# Patient Record
Sex: Male | Born: 1935 | Race: White | Hispanic: No | Smoking: Former smoker
Health system: Southern US, Community
[De-identification: ages and names within clinical notes are randomized; demographics above are authoritative.]

## PROBLEM LIST (undated history)

## (undated) DIAGNOSIS — M1990 Unspecified osteoarthritis, unspecified site: Secondary | ICD-10-CM

## (undated) DIAGNOSIS — I10 Essential (primary) hypertension: Secondary | ICD-10-CM

## (undated) DIAGNOSIS — E785 Hyperlipidemia, unspecified: Secondary | ICD-10-CM

## (undated) DIAGNOSIS — I495 Sick sinus syndrome: Secondary | ICD-10-CM

## (undated) DIAGNOSIS — I779 Disorder of arteries and arterioles, unspecified: Secondary | ICD-10-CM

## (undated) DIAGNOSIS — I739 Peripheral vascular disease, unspecified: Secondary | ICD-10-CM

## (undated) DIAGNOSIS — I2510 Atherosclerotic heart disease of native coronary artery without angina pectoris: Secondary | ICD-10-CM

## (undated) DIAGNOSIS — C07 Malignant neoplasm of parotid gland: Secondary | ICD-10-CM

## (undated) DIAGNOSIS — I4891 Unspecified atrial fibrillation: Secondary | ICD-10-CM

## (undated) HISTORY — PX: INSERT / REPLACE / REMOVE PACEMAKER: SUR710

## (undated) HISTORY — DX: Atherosclerotic heart disease of native coronary artery without angina pectoris: I25.10

## (undated) HISTORY — DX: Malignant neoplasm of parotid gland: C07

## (undated) HISTORY — DX: Sick sinus syndrome: I49.5

## (undated) HISTORY — PX: EYE SURGERY: SHX253

## (undated) HISTORY — DX: Hyperlipidemia, unspecified: E78.5

## (undated) HISTORY — PX: CATARACT EXTRACTION W/ INTRAOCULAR LENS IMPLANT: SHX1309

## (undated) NOTE — *Deleted (*Deleted)
STROKE TEAM PROGRESS NOTE   INTERVAL HISTORY ***   OBJECTIVE Vitals:   10/13/20 1945 10/14/20 0038 10/14/20 0517 10/14/20 0751  BP: (!) 164/64 (!) 116/94 (!) 169/60 (!) 108/50  Pulse: 64 64 65 64  Resp: 20 20 18 20   Temp: 98.7 F (37.1 C) 97.7 F (36.5 C) 98.1 F (36.7 C) 97.8 F (36.6 C)  TempSrc: Oral Oral Oral   SpO2: 98% 98% 100% 100%  Weight:      Height:        CBC:  Recent Labs  Lab 10/12/20 0855 10/12/20 0855 10/12/20 1817 10/13/20 0246  WBC 9.8  --   --  10.7*  HGB 7.4*   < > 9.2* 9.2*  HCT 24.6*   < > 29.4* 29.2*  MCV 75.7*  --   --  76.6*  PLT 229  --   --  248   < > = values in this interval not displayed.    Basic Metabolic Panel:  Recent Labs  Lab 10/09/20 1112 10/09/20 1112 10/10/20 0340 10/10/20 1835 10/13/20 0246 10/14/20 0327  NA 150*   < > 146*   < > 145 143  K 3.3*   < > 2.9*   < > 3.3* 3.9  CL 118*   < > 119*   < > 114* 112*  CO2 17*   < > 18*   < > 22 20*  GLUCOSE 118*   < > 115*   < > 115* 105*  BUN 41*   < > 44*   < > 30* 26*  CREATININE 1.35*   < > 1.13   < > 1.11 1.03  CALCIUM 8.4*   < > 7.9*   < > 7.9* 7.6*  MG 2.1  --  2.2  --   --   --    < > = values in this interval not displayed.    Lipid Panel:     Component Value Date/Time   CHOL 87 10/07/2020 0539   TRIG 42 10/07/2020 0539   TRIG 39 10/07/2020 0539   HDL 53 10/07/2020 0539   CHOLHDL 1.6 10/07/2020 0539   VLDL 8 10/07/2020 0539   LDLCALC 26 10/07/2020 0539   HgbA1c:  Lab Results  Component Value Date   HGBA1C 5.7 (H) 10/07/2020   Urine Drug Screen:     Component Value Date/Time   LABOPIA NONE DETECTED 07/02/2020 1830   COCAINSCRNUR NONE DETECTED 07/02/2020 1830   LABBENZ NONE DETECTED 07/02/2020 1830   AMPHETMU NONE DETECTED 07/02/2020 1830   THCU NONE DETECTED 07/02/2020 1830   LABBARB NONE DETECTED 07/02/2020 1830    Alcohol Level     Component Value Date/Time   ETH <10 10/06/2020 1727    IMAGING  CT HEAD CODE STROKE WO CONTRAST 10/06/20  1. Hyperdense Right MCA suspicious for Emergent Large Vessel Occlusion.  2. No acute cortically based infarct or acute intracranial hemorrhage identified. ASPECTS 10.  3. Stable CT appearance of brain parenchyma since August.   CT Code Stroke CTA Head and Neck W/WO contrast + CT Perfusion Brain  1. No emergent large vessel occlusion.  2. Persistent radiographic string sign of the proximal right ICA.  3. Delayed transit throughout the entire right cerebral hemisphere and left ACA territory related to the high-grade proximal right ICA stenosis and chronically diminutive/diseased left ACA. No core infarct identified by CTP.  4. Severe right P2 stenosis.  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  6. Bilateral ground-glass lung opacities which  could reflect edema or infection. Moderate right pleural effusion.  7.  Aortic Atherosclerosis (ICD10-I70.0).   CT HEAD WO CONTRAST 10/07/20  No acute finding by CT. Chronic small-vessel ischemic changes throughout the brain as outlined above.   DG Chest Port 1 View 10/06/20 Enlargement of cardiac silhouette with pulmonary vascular congestion. Increased interstitial markings in the mid to lower lungs question minimal edema or developing chronic interstitial lung disease.   Transthoracic Echocardiogram 10/07/20 1. Akinesis of the distal septum and apex; overall mildly reduced LV  function; no thrombus noted using definity.  2. Left ventricular ejection fraction, by estimation, is 40 to 45%. The  left ventricle has mildly decreased function. The left ventricle  demonstrates regional wall motion abnormalities (see scoring  diagram/findings for description). Left ventricular  diastolic function could not be evaluated.  3. Right ventricular systolic function is normal. The right ventricular  size is normal.  4. Left atrial size was severely dilated.  5. Right atrial size was severely dilated.  6. The mitral valve is normal in structure. Mild  mitral valve  regurgitation. No evidence of mitral stenosis.  7. Tricuspid valve regurgitation is moderate.  8. The aortic valve is tricuspid. Aortic valve regurgitation is mild.  Mild aortic valve stenosis.  9. The inferior vena cava is normal in size with greater than 50%  respiratory variability, suggesting right atrial pressure of 3 mmHg.   Transesophageal Echocardiogram 10/10/20 1. Akinesis of the distal septum and apex; overall mild LV dysfunction;  no vegetations.  2. Left ventricular ejection fraction, by estimation, is 45 to 50%. The  left ventricle has mildly decreased function. The left ventricle  demonstrates regional wall motion abnormalities (see scoring  diagram/findings for description).  3. Right ventricular systolic function is normal. The right ventricular  size is normal.  4. Left atrial size was severely dilated. No left atrial/left atrial  appendage thrombus was detected.  5. Right atrial size was moderately dilated.  6. The mitral valve is abnormal. Mild mitral valve regurgitation.  7. Tricuspid valve regurgitation is moderate to severe.  8. The aortic valve is tricuspid. Aortic valve regurgitation is mild.  9. There is Moderate (Grade III) plaque involving the descending aorta.   ECG - atrial fibrillation - ventricular response 78 BPM (See cardiology reading for complete details)  PHYSICAL EXAM ***  Temp:  [97.7 F (36.5 C)-98.9 F (37.2 C)] 97.8 F (36.6 C) (11/20 0751) Pulse Rate:  [62-68] 64 (11/20 0751) Resp:  [18-20] 20 (11/20 0751) BP: (108-169)/(50-94) 108/50 (11/20 0751) SpO2:  [97 %-100 %] 100 % (11/20 0751)  General - Well nourished, well developed, mild SOB with exertional tachypnea  Ophthalmologic - fundi not visualized due to noncooperation.  Cardiovascular - irregularly irregular heart rate and rhythm  Neuro - eyes open, moderate to severe dysarthria but able to answer questions. Orientated to self, age, situation but not  to year or month. However, able to follow simple commands, able to name and repeat but with very dysarthric and SOB with talking. Eyes able to gaze bilaterally, visual field full. PERRL. Left facial droop. Tongue protrusion midline. RUE 4/5, LUE 4-/5 without drift and finger grip and wrist extension 3/5. BLE 3-/5, able to hold knee flexion and foot on bed position but LLE weaker than left. No babinski bilaterally. Sensation subjectively symmetrical, coordination slow but grossly intact bilaterally and gait not tested.   ASSESSMENT/PLAN [name] is a 62 y.o. male with history of atrial fibrillation on Xarelto, PPM, L CEA, CAD,  Htn, HLD, dementia and squamous cell cancer R parotid resected 2014, presenting with left sided weakness. He did not receive IV t-PA due to anticoagulation with Xarelto. Acute right ICA angioplasty with cerebral protection 10/06/20 at 11:28 PM Dr Loreta Ave.  Stroke: R MCA infarct due to right ICA high grade stenosis s/p angioplasty, etiology likely due to large vessel disease. However, given fever and bacteremia, needs to rule out endocarditis   CT Head - Hyperdense Right MCA suspicious for Emergent Large Vessel Occlusion. No acute cortically based infarct or acute intracranial hemorrhage identified. ASPECTS 10.   CTA H&N - Persistent radiographic string sign of the proximal right ICA. Severe right P2 stenosis.   CTP - Delayed transit throughout the entire right cerebral hemisphere and left ACA territory related to the high-grade proximal right ICA stenosis and chronically diminutive/diseased left ACA. No core infarct identified by CTP.   IR - right ICA angioplasty, 86%->41% stenosis post angioplasty  CT head repeat x 2 - No acute finding by CT. Chronic small-vessel ischemic changes throughout the brain as outlined above.   2D Echo - EF 40 to 45% down from 60-65% in 06/2020, akinesis of the distal septum and apex  Carotid doppler right ICA 80-99% stenosis  Loyal Jacobson Virus 2 - negative  LDL - 26  HgbA1c - 5.7  UDS - not ordered  VTE prophylaxis - Lovenox  Xarelto (rivaroxaban) daily prior to admission, now on ASA 81 given Carotid stenosis. Xarelto resumed on 10/11/20 but currently on hold due to GI bleeding and anemia. S/p PRBC, if Hb continues to be stable for the next 4-5 days, will re-challenge with Xarelto  Patient counseled to be compliant with his antithrombotic medications  Ongoing aggressive stroke risk factor management  Therapy recommendations:  CIR  Disposition:  Anticipating admission to CIR tomorrow if stable.  Carotid stenosis  Hx of left ICA stenosis s/p left CEA  06/2020 CT head and neck showed right ICA bulb string sign  This admission CT head and neck again showed right ICA bulb string sign, status post angioplasty.  Right ICA 86% -> 41% stenosis post-procedure  Carotid Doppler post angioplasty right ICA 80-99% stenosis  Discussed with Dr. Loreta Ave, given clinical improvement, no further carotid revascularization needed at this time.  Will close follow-up in the clinic.  Continue ASA   Chronic afib Tachy-brady syndrome s/p pacer Cardiomyopathy  Pulmonary edema  On Xarelto PTA  Xarelto resumed for atrial fibrillation on 10/11/20 -> on hold 11/18 due to GIB  2D Echo - EF 40 to 45% down from 60-65% in 06/2020, akinesis of the distal septum and apex  CXR concerning for pulmonary edema -> improved  Still has tachypnea   TEE severe LAE; spontaneous contrast in LAA but no thrombus  Cardiology on board - appreciate help  Bacteremia  Fever Leukocytosis  Tmax 101.1->afebrile  On Abx for total 10 day course  Blood culture 2/2 group B strep - pan sensitive  WBC 17.1->17.7->15.2->8.9->9.8->10.7  TEE no endocarditis  CCM signed off  GIB  GI bleeding in 03/2018 with melena.  He received 2 units PRBC and IV iron.  GI recommended PPI twice daily, and holding Xarelto for a week at that time.    This  admission, given his GIB history, dropping hemoglobin, CHF, he received 1 unit PRBC transfusion 11/18.   Start on iron pills  Continue PPI twice daily.    Hb 8.8->9.8->7.4->9.2->9.2  Will hold off Xarelto for now, continue baby aspirin.    Once  hemoglobin stabilized for 4-5 days, will rechallenge with Xarelto.    Hx of TIA  06/2020 admitted to [facility] for confusion and slurry speech with slump over and left facial droop. CTA head and neck showed right ICA bulb string sign. EF 60-65%, LDL 61 and A1C 5.9.   Hx of Hypertension Hypotension   Home BP meds: metoprolol  Current BP meds: off levophed  SBP has fluctuated in the last 24 hours alternating between soft pressures and elevations to the 150's and 160s. Will adjust HTN regimen as indicated.  . Long-term BP goal normotensive  Hyperlipidemia  Home Lipid lowering medication: Lipitor 40 mg daily  LDL 26, goal < 70  Now on Lipitor 20 mg this hospitalization.   Continue statin at discharge  Dysphagia   Passed barium study 11/15  On dys1 and thin  Speech on board  Other Stroke Risk Factors  Advanced age  Former cigarette smoker - quit 47 years ago  ETOH use, pt has 1 drink per week  Coronary artery disease  Other Active Problems   Code status - DNR  Aortic Atherosclerosis (ICD10-I70.0).   Acute blood loss anemia - Hgb - 8.8->7.9->8.2->8.8->9.8->7.4->transfused ->9.2->9.2 CKD - stage 3b - creatinine - 1.30->1.23->1.48->1.13->1.05>1.11  Dementia - lives with son  Hypokalemia K 4.3-3.3-2.9->3.3 - supplement and recheck in AM ->3.9  Bmet and CBC were ordered for today. Bmet results in chart. CBC still not back. Called lab. They said specimen was clotted but for some reason not redrawn. Discussed with pt's nurse. She will put in stat order for CBC. Pt for possible CIR admission today.  Hospital day # 8  *** To contact Stroke Continuity provider, please refer to [email or web address]. After hours, contact General Neurology

## (undated) NOTE — *Deleted (*Deleted)
Progress Note  Patient Name: Fernando [name] Date of Encounter: 10/08/2020  [facility] Cardiologist: No primary care provider on file. ***  Subjective   ***  Inpatient Medications    Scheduled Meds: . aspirin  300 mg Rectal Daily   Or  . aspirin  325 mg Oral Daily  . atorvastatin  80 mg Oral Daily  . Chlorhexidine Gluconate Cloth  6 each Topical Daily  . docusate sodium  100 mg Oral Daily  . enoxaparin (LOVENOX) injection  30 mg Subcutaneous Q24H  . insulin aspart  0-9 Units Subcutaneous Q4H  . levothyroxine  50 mcg Oral Q0600  . mouth rinse  15 mL Mouth Rinse BID  . polyethylene glycol  17 g Oral Daily   Continuous Infusions: . sodium chloride 30 mL/hr at 10/08/20 1800  . cefTRIAXone (ROCEPHIN)  IV Stopped (10/07/20 2342)  . norepinephrine (LEVOPHED) Adult infusion 3 mcg/min (10/08/20 1800)   PRN Meds: acetaminophen **OR** acetaminophen   Vital Signs    Vitals:   10/08/20 1815 10/08/20 1830 10/08/20 1845 10/08/20 2006  BP: 140/79 119/65 129/66   Pulse: 84 67 67   Resp: (!) 32 (!) 27 (!) 22   Temp:    98.8 F (37.1 C)  TempSrc:    Axillary  SpO2: 95% 92% 92%   Weight:      Height:        Intake/Output Summary (Last 24 hours) at 10/08/2020 2008 Last data filed at 10/08/2020 1800 Gross per 24 hour  Intake 2291.51 ml  Output 1735 ml  Net 556.51 ml   Last 3 Weights 10/06/2020 07/02/2020 05/08/2020  Weight (lbs) 143 lb 143 lb 14.4 oz 130 lb  Weight (kg) 64.864 kg 65.273 kg 58.968 kg      Telemetry    *** - Personally Reviewed  ECG    *** - Personally Reviewed  Physical Exam  *** GEN: No acute distress.   Neck: No JVD Cardiac: RRR, no murmurs, rubs, or gallops.  Respiratory: Clear to auscultation bilaterally. GI: Soft, nontender, non-distended  MS: No edema; No deformity. Neuro:  Nonfocal  Psych: Normal affect   Labs    High Sensitivity Troponin:  No results for input(s): TROPONINIHS in the last 720 hours.    Chemistry Recent Labs   Lab 10/06/20 1042 10/06/20 1042 10/06/20 1738 10/06/20 1738 10/07/20 0242 10/07/20 0539 10/08/20 0225  NA 138   < > 142   < > 145 140 146*  K 3.8   < > 3.7   < > 3.7 3.2* 4.3  CL 102   < > 109  --   --  114* 118*  CO2 20*  --   --   --   --  16* 16*  GLUCOSE 172*   < > 145*  --   --  110* 108*  BUN 37*   < > 32*  --   --  32* 34*  CREATININE 1.49*   < > 1.30*  --   --  1.23 1.48*  CALCIUM 9.1  --   --   --   --  7.4* 7.9*  PROT 7.8  --   --   --   --   --   --   ALBUMIN 3.7  --   --   --   --   --   --   AST 29  --   --   --   --   --   --   ALT  18  --   --   --   --   --   --   ALKPHOS 62  --   --   --   --   --   --   BILITOT 1.0  --   --   --   --   --   --   GFRNONAA 44*  --   --   --   --  55* 44*  ANIONGAP 16*  --   --   --   --  10 12   < > = values in this interval not displayed.     Hematology Recent Labs  Lab 10/06/20 1042 10/06/20 1738 10/07/20 0242 10/07/20 0539 10/08/20 0225  WBC 17.1*  --   --  17.1* 17.7*  RBC 3.95*  --   --  3.42* 3.62*  HGB 9.1*   < > 8.8* 7.9* 8.2*  HCT 30.1*   < > 26.0* 25.6* 27.5*  MCV 76.2*  --   --  74.9* 76.0*  MCH 23.0*  --   --  23.1* 22.7*  MCHC 30.2  --   --  30.9 29.8*  RDW 18.2*  --   --  17.9* 18.4*  PLT 242  --   --  196 228   < > = values in this interval not displayed.    BNP Recent Labs  Lab 10/06/20 1042  BNP 1,097.0*     DDimer No results for input(s): DDIMER in the last 168 hours.   Radiology    CT HEAD WO CONTRAST  Result Date: 10/07/2020 CLINICAL DATA:  Stenotic disease of the carotid arteries right worse than left. Assess for potential infarction on the right. EXAM: CT HEAD WITHOUT CONTRAST TECHNIQUE: Contiguous axial images were obtained from the base of the skull through the vertex without intravenous contrast. COMPARISON:  10/06/2020 FINDINGS: Brain: Chronic appearing small vessel ischemic changes of the pons and midbrain. Old cerebellar infarctions on the left. Cerebral hemispheres show an old  lacunar infarction in the left thalamus and right caudate head and chronic small-vessel ischemic changes of the cerebral hemispheric white matter. No sign of acute infarction, mass lesion, hemorrhage, hydrocephalus or extra-axial collection. Vascular: There is atherosclerotic calcification of the major vessels at the base of the brain. Skull: Negative Sinuses/Orbits: Mucosal inflammatory changes of the paranasal sinuses. Orbits negative. Other: None IMPRESSION: No acute finding by CT. Chronic small-vessel ischemic changes throughout the brain as outlined above. Electronically Signed   By: Paulina Fusi M.D.   On: 10/07/2020 06:15   DG Chest Port 1 View  Result Date: 10/08/2020 CLINICAL DATA:  Shortness of breath EXAM: PORTABLE CHEST 1 VIEW COMPARISON:  October 06, 2020 FINDINGS: There is cardiomegaly with pulmonary venous hypertension. Pacemaker leads are attached to the right atrium and right ventricle. There are small pleural effusions bilaterally with mild interstitial edema. No consolidation. There is aortic atherosclerosis. No adenopathy. No bone lesions. IMPRESSION: Cardiomegaly with pulmonary vascular congestion. Small pleural effusions bilaterally with mild interstitial edema. Suspect a degree of underlying congestive heart failure. Pacemaker leads attached to right atrium and right ventricle. Aortic Atherosclerosis (ICD10-I70.0). Electronically Signed   By: Bretta Bang III M.D.   On: 10/08/2020 09:57   ECHOCARDIOGRAM COMPLETE  Result Date: 10/07/2020    ECHOCARDIOGRAM REPORT   Patient Name:   Fernando [name] Date of Exam: 10/07/2020 Medical Rec #:  [id number]      Height:       65.0 in Accession #:  [id number]     Weight:       143.0 lb Date of Birth:  [birth year]     BSA:          1.715 m Patient Age:    89 years       BP:           119/82 mmHg Patient Gender: M              HR:           111 bpm. Exam Location:  Inpatient Procedure: 2D Echo, Intracardiac Opacification Agent, Color Doppler  and Cardiac            Doppler Indications:    Stroke  History:        Patient has prior history of Echocardiogram examinations, most                 recent 07/03/2020. CAD, Pacemaker, Arrythmias:Atrial Fibrillation;                 Risk Factors:Dyslipidemia, Hypertension and Former Smoker.                 Tachy-Brady syndrome.  Sonographer:    Ross Ludwig RDCS (AE) Referring Phys: [id number] Marvel Plan IMPRESSIONS  1. Akinesis of the distal septum and apex; overall mildly reduced LV function; no thrombus noted using definity.  2. Left ventricular ejection fraction, by estimation, is 40 to 45%. The left ventricle has mildly decreased function. The left ventricle demonstrates regional wall motion abnormalities (see scoring diagram/findings for description). Left ventricular diastolic function could not be evaluated.  3. Right ventricular systolic function is normal. The right ventricular size is normal.  4. Left atrial size was severely dilated.  5. Right atrial size was severely dilated.  6. The mitral valve is normal in structure. Mild mitral valve regurgitation. No evidence of mitral stenosis.  7. Tricuspid valve regurgitation is moderate.  8. The aortic valve is tricuspid. Aortic valve regurgitation is mild. Mild aortic valve stenosis.  9. The inferior vena cava is normal in size with greater than 50% respiratory variability, suggesting right atrial pressure of 3 mmHg. FINDINGS  Left Ventricle: Left ventricular ejection fraction, by estimation, is 40 to 45%. The left ventricle has mildly decreased function. The left ventricle demonstrates regional wall motion abnormalities. Definity contrast agent was given IV to delineate the left ventricular endocardial borders. The left ventricular internal cavity size was normal in size. There is no left ventricular hypertrophy. Left ventricular diastolic function could not be evaluated due to atrial fibrillation. Left ventricular diastolic function could not be evaluated. Right  Ventricle: The right ventricular size is normal. Right ventricular systolic function is normal. Left Atrium: Left atrial size was severely dilated. Right Atrium: Right atrial size was severely dilated. Pericardium: Trivial pericardial effusion is present. Mitral Valve: The mitral valve is normal in structure. Mild mitral annular calcification. Mild mitral valve regurgitation. No evidence of mitral valve stenosis. Tricuspid Valve: The tricuspid valve is normal in structure. Tricuspid valve regurgitation is moderate . No evidence of tricuspid stenosis. Aortic Valve: The aortic valve is tricuspid. Aortic valve regurgitation is mild. Aortic regurgitation PHT measures 292 msec. Mild aortic stenosis is present. Aortic valve mean gradient measures 10.5 mmHg. Aortic valve peak gradient measures 21.6 mmHg. Aortic valve area, by VTI measures 1.46 cm. Pulmonic Valve: The pulmonic valve was not well visualized. Pulmonic valve regurgitation is not visualized. No evidence of pulmonic stenosis. Aorta: The aortic root is normal in size  and structure. Venous: The inferior vena cava is normal in size with greater than 50% respiratory variability, suggesting right atrial pressure of 3 mmHg. IAS/Shunts: No atrial level shunt detected by color flow Doppler. Additional Comments: Akinesis of the distal septum and apex; overall mildly reduced LV function; no thrombus noted using definity. A pacer wire is visualized.  LEFT VENTRICLE PLAX 2D LVOT diam:     2.00 cm LV SV:         49 LV SV Index:   29 LVOT Area:     3.14 cm  RIGHT VENTRICLE            IVC RV Basal diam:  3.30 cm    IVC diam: 2.10 cm RV S prime:     9.46 cm/s TAPSE (M-mode): 1.3 cm LEFT ATRIUM            Index       RIGHT ATRIUM           Index LA Vol (A2C): 81.4 ml  47.46 ml/m RA Area:     31.90 cm LA Vol (A4C): 102.0 ml 59.47 ml/m RA Volume:   109.00 ml 63.55 ml/m  AORTIC VALVE AV Area (Vmax):    1.42 cm AV Area (Vmean):   1.28 cm AV Area (VTI):     1.46 cm AV Vmax:            232.50 cm/s AV Vmean:          147.500 cm/s AV VTI:            0.335 m AV Peak Grad:      21.6 mmHg AV Mean Grad:      10.5 mmHg LVOT Vmax:         105.00 cm/s LVOT Vmean:        60.100 cm/s LVOT VTI:          0.156 m LVOT/AV VTI ratio: 0.47 AI PHT:            292 msec  AORTA Ao Root diam: 3.50 cm Ao Asc diam:  3.00 cm TRICUSPID VALVE TR Peak grad:   57.8 mmHg TR Vmax:        380.00 cm/s  SHUNTS Systemic VTI:  0.16 m Systemic Diam: 2.00 cm Olga Millers MD Electronically signed by Olga Millers MD Signature Date/Time: 10/07/2020/4:08:38 PM    Final     Cardiac Studies   10/07/20: Echocardiogram 10/07/2020  1. Akinesis of the distal septum and apex; overall mildly reduced LV  function; no thrombus noted using definity.  2. Left ventricular ejection fraction, by estimation, is 40 to 45%. The  left ventricle has mildly decreased function. The left ventricle  demonstrates regional wall motion abnormalities (see scoring  diagram/findings for description). Left ventricular  diastolic function could not be evaluated.  3. Right ventricular systolic function is normal. The right ventricular  size is normal.  4. Left atrial size was severely dilated.  5. Right atrial size was severely dilated.  6. The mitral valve is normal in structure. Mild mitral valve  regurgitation. No evidence of mitral stenosis.  7. Tricuspid valve regurgitation is moderate.  8. The aortic valve is tricuspid. Aortic valve regurgitation is mild.  Mild aortic valve stenosis.  9. The inferior vena cava is normal in size with greater than 50%  respiratory variability, suggesting right atrial pressure of 3 mmHg.   Patient Profile     60 y.o. male history of tachy-brady syndrome s/p PPM placement, permanent Afib  on xarelto, carotid artery stenosis who presented with stroke found to have strep bacteremia and systolic heart failure.  Assessment & Plan    #Strep agalactiae bacteremia #Sepsis  #Systolic  heart failure:  #Right MCA stroke  #Permanent Afib CHADs-vasc 7  #Tachy-brady syndrome s/p PPM placement   {Are we signing off today?:[id number]}  For questions or updates, please contact [facility] Please consult [email or web address] for contact info under        Signed, Meriam Sprague, MD  10/08/2020, 8:08 PM

## (undated) NOTE — *Deleted (*Deleted)
Physical Medicine and Rehabilitation Admission H&P    Chief Complaint  Patient presents with  . Shortness of Breath  . Back Pain  : HPI: Fernando [name] is a 34 year old right-handed male with history of atrial fibrillation maintained on Xarelto, hypertension, CKD stage III 1.30-1.48, history of GI bleed 2019, CAD with pacemaker, hyperlipidemia, prior left CEA, SCC of the carotid gland and recent TIA August 2021.  He quit smoking 47 years ago.  Per chart review lives with children.  He was receiving assistance for ADLs and home care.  1 level home 6 steps to entry.  Presented 10/07/2020 [facility] left-sided weakness as well as dysarthria.  Admission chemistries glucose 172 BUN 37 creatinine 1.49, lactic acid 5.0, BNP 1097, WBC 17,100, hemoglobin 9.1, hemoglobin A1c 5.7. Cranial CT scan showed hyperdense right MCA suspicious for emergent large vessel occlusion.  No acute cortically based infarct or acute intracranial hemorrhage.  Patient did not receive TPA.  Carotid Doppler with right ICA stenosis 80 - 99%.  Echocardiogram with ejection fraction of 40 to 45% without thrombus.  TEE showed akinesis of the distal septum and apex ejection fraction 50% no thrombus noted.  No vegetation.  CTA of the head and neck no emergent large vessel occlusion.  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  Delayed transit throughout the entire right cerebral hemisphere and left ACA territory related to high-grade proximal right ICA stenosis and chronically diminutive disease left ACA.  Patient underwent ICA angioplasty per interventional radiology.  He did require short-term intubation.  Hospital course fever blood cultures 2/2 G+ cocci maintained on Rocephin.  He was cleared to begin aspirin for CVA prophylaxis for right MCA infarction due to right ICA high-grade stenosis and AWAIT PLAN TO RESUME CHRONIC XARELTO.    Currently on a dysphagia #1 thin liquid diet.  Blood culture 2/2 group B strep pansensitive placed  on Ancef therapy evaluations completed and patient was admitted for a comprehensive rehab program  Review of Systems  Constitutional: Positive for fever.  HENT: Negative for hearing loss.   Eyes: Negative for blurred vision and double vision.  Respiratory: Negative for cough and shortness of breath.   Cardiovascular: Positive for palpitations and leg swelling.  Gastrointestinal: Positive for constipation. Negative for heartburn, nausea and vomiting.  Genitourinary: Positive for urgency. Negative for dysuria, flank pain and hematuria.  Musculoskeletal: Positive for joint pain and myalgias.  Skin: Negative for rash.  Neurological: Positive for sensory change and weakness.  All other systems reviewed and are negative.  Past Medical History:  Diagnosis Date  . Arthritis   . Atrial fibrillation (HCC)   . Carotid artery disease (HCC)    Dr. Hart Rochester - s/p left CEA  . Coronary artery disease   . Essential hypertension, benign   . Hyperlipidemia   . 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 This mass was initially felt to be 1.7 cm at the time of presentation but had enlarged significantly to 4 cm clinically at the time of surgery. Pathologically it was 3.4 cm. A lymph node deep to the parotid was enlarged clinically at the time of surgery but was negative   . Tachycardia-bradycardia syndrome [facility])    Past Surgical History:  Procedure Laterality Date  . CAROTID ENDARTERECTOMY Left Jan. 3, 2009   CE  . CATARACT EXTRACTION W/ INTRAOCULAR LENS IMPLANT     Bilateral  .  ESOPHAGOGASTRODUODENOSCOPY N/A 03/29/2018   Procedure: ESOPHAGOGASTRODUODENOSCOPY (EGD);  Surgeon: West Bali, MD;  Location[facility];  Service: Endoscopy;  Laterality: N/A;  . EYE SURGERY    . INSERT / REPLACE / REMOVE PACEMAKER    . IR INTRAVSC STENT CERV CAROTID W/EMB-PROT MOD  SED INCL ANGIO  10/06/2020  . IR US GUIDE VASC ACCESS LEFT  10/06/2020  . IR US GUIDE VASC ACCESS RIGHT  10/06/2020  . IR US GUIDE VASC ACCESS RIGHT  10/06/2020  . PACEMAKER INSERTION  10-01-2012  . PARATHYROIDECTOMY  10/28/2012  . PAROTIDECTOMY  10/28/2012   Procedure: PAROTIDECTOMY;  Surgeon: Darletta Moll, MD;  Location: [facility] OR;  Service: ENT;  Laterality: Right;  Total Parotidectomy  . PERMANENT PACEMAKER INSERTION N/A 10/01/2012   Procedure: PERMANENT PACEMAKER INSERTION;  Surgeon: Marinus Maw, MD;  Location: [facility] CATH LAB;  Service: Cardiovascular;  Laterality: N/A;  . RADIOLOGY WITH ANESTHESIA N/A 10/07/2020   Procedure: MRI WITH ANESTHESIA;  Surgeon: Radiologist, Medication, MD;  Location: [facility] OR;  Service: Radiology;  Laterality: N/A;  . RADIOLOGY WITH ANESTHESIA N/A 10/06/2020   Procedure: IR WITH ANESTHESIA;  Surgeon: Julieanne Cotton, MD;  Location: [facility] OR;  Service: Radiology;  Laterality: N/A;  . TEE WITHOUT CARDIOVERSION N/A 10/10/2020   Procedure: TRANSESOPHAGEAL ECHOCARDIOGRAM (TEE);  Surgeon: Lewayne Bunting, MD;  Location: [facility] ENDOSCOPY;  Service: Cardiovascular;  Laterality: N/A;   Family History  Problem Relation Age of Onset  . Heart disease Sister   . Heart attack Sister   . Cirrhosis Mother   . Cancer Brother        brain cancer  . Cancer Sister        stomach cancer   Social History:  reports that he quit smoking about 47 years ago. His smoking use included cigarettes. He has never used smokeless tobacco. He reports current alcohol use of about 1.0 standard drink of alcohol per week. He reports that he does not use drugs. Allergies: No Known Allergies Medications Prior to Admission  Medication Sig Dispense Refill  . acetaminophen (TYLENOL) 500 MG tablet Take 1,000 mg by mouth every 6 (six) hours as needed for mild pain.    Marland Kitchen atorvastatin (LIPITOR) 40 MG tablet Take 40 mg by mouth every morning.     Marland Kitchen levothyroxine (SYNTHROID) 50 MCG tablet Take 1 tablet (50 mcg  total) by mouth daily before breakfast. 30 tablet 3  . metoprolol tartrate (LOPRESSOR) 25 MG tablet TAKE 1 TABLET TWICE A DAY (Patient taking differently: Take 25 mg by mouth 2 (two) times daily. ) 180 tablet 0  . pantoprazole (PROTONIX) 40 MG tablet Take 1 tablet (40 mg total) by mouth 2 (two) times daily before a meal. Twice a day for 3 months and then start taking daily. (Patient taking differently: Take 40 mg by mouth 2 (two) times daily before a meal. ) 60 tablet 3  . XARELTO 15 MG TABS tablet TAKE 1 TABLET BY MOUTH ONCE DAILY WITH SUPPER (Patient taking differently: Take 15 mg by mouth daily with supper. ) 90 tablet 3  . ondansetron (ZOFRAN ODT) 4 MG disintegrating tablet 4mg  ODT q4 hours prn nausea/vomit (Patient not taking: Reported on 10/09/2020) 12 tablet 0    Drug Regimen Review Drug regimen was reviewed and remains appropriate with no significant issues identified  Home: Home Living Family/patient expects to be discharged to:: Private residence Living Arrangements: Children Available Help at Discharge: Family Type of Home: House Home Access: Stairs to enter Entrance  Stairs-Number of Steps: 5 Entrance Stairs-Rails: Right, Left Home Layout: One level Bathroom Shower/Tub: Engineer, manufacturing systems: Handicapped height Bathroom Accessibility: Yes Home Equipment: Environmental consultant - 2 wheels, Cane - single point, Bedside commode, Shower seat - built in Additional Comments: information from previous charting, pt unable to state clear answers to history questions or consistently answer yes/no. No family present at eval  Lives With: Son   Functional History: Prior Function Level of Independence: Needs assistance Comments: Per neurology note, pt received assist for ADLs and home care such as cooking, but pt did not need assist for mobility. Pt gave inconsistent answers during history in session  Functional Status:  Mobility: Bed Mobility Overal bed mobility: Needs Assistance Bed  Mobility: Supine to Sit Rolling: Mod assist Sidelying to sit: Mod assist, +2 for physical assistance Supine to sit: HOB elevated, Mod assist General bed mobility comments: Pt requiring assist for L LE to EOB and to lift trunk; good initiation with lifting trunk but did requiring mod A and mod A to scoot forward; required cues for sequencing Transfers Overall transfer level: Needs assistance Equipment used: 1 person hand held assist, 2 person hand held assist (back of chari) Transfers: Sit to/from Stand, Pharmacologist Sit to Stand: Max assist, +2 physical assistance, From elevated surface Stand pivot transfers: Max assist, +2 physical assistance General transfer comment: 3 x sit to stand from elevated bed with max A of 2 to complete. Pt with mod A initially but required max x 2 to come upright and still with difficulty tucking buttock.  Max x 2 pivot to chair toward L side Ambulation/Gait Ambulation/Gait assistance: Max assist, +2 physical assistance Gait Distance (Feet): 3 Feet Assistive device: Rolling walker (2 wheeled) Gait Pattern/deviations: Step-to pattern, Decreased stride length, Shuffle, Leaning posteriorly, Trunk flexed General Gait Details: NT - DOE 2/4, LE buckling Gait velocity interpretation: <1.31 ft/sec, indicative of household ambulator    ADL: ADL Overall ADL's : Needs assistance/impaired Eating/Feeding: Supervision/ safety, Set up, Minimal assistance, Sitting Eating/Feeding Details (indicate cue type and reason): light MIN A to facilitate AROM of LUE Grooming: Minimal assistance, Sitting Upper Body Bathing: Moderate assistance, Sitting Lower Body Bathing: Maximal assistance, +2 for physical assistance, +2 for safety/equipment, Sit to/from stand Upper Body Dressing : Moderate assistance, Sitting Lower Body Dressing: Maximal assistance, Sitting/lateral leans, Sit to/from stand Toilet Transfer: Maximal assistance, +2 for physical assistance, +2 for  safety/equipment Toilet Transfer Details (indicate cue type and reason): simulated via functional mobility MAX A +2 to pivot to recliner Toileting- Clothing Manipulation and Hygiene: Total assistance, Sit to/from stand Toileting - Clothing Manipulation Details (indicate cue type and reason): for posterior pericare in standing Functional mobility during ADLs: Maximal assistance, +2 for physical assistance (stand pivot transfer) General ADL Comments: pt continues to present with decreased activity tolerance, generalized weakness decreased functional ROM of LUE  Cognition: Cognition Overall Cognitive Status: Impaired/Different from baseline Arousal/Alertness: Lethargic Orientation Level: Oriented to person, Oriented to place Safety/Judgment: Impaired Cognition Arousal/Alertness: Awake/alert Behavior During Therapy: WFL for tasks assessed/performed Overall Cognitive Status: Impaired/Different from baseline Area of Impairment: Following commands, Safety/judgement, Problem solving, Attention Orientation Level: Disoriented to, Situation, Time Current Attention Level: Selective Following Commands: Follows one step commands with increased time Safety/Judgement: Decreased awareness of safety, Decreased awareness of deficits Problem Solving: Slow processing, Requires verbal cues, Requires tactile cues, Difficulty sequencing General Comments: Pt is HOH and sometiimes difficult to assist.  Granddaughter present and reports he is seeming closer to baaseline.  Does require increased time  and multimodal cueing Difficult to assess due to: Hard of hearing/deaf  Physical Exam: Blood pressure 107/68, pulse 63, temperature 98 F (36.7 C), temperature source Oral, resp. rate 17, height 5\' 5"  (1.651 m), weight 72.3 kg, SpO2 95 %. Physical Exam Neurological:     Comments: Patient is alert and severely dysarthric.  Makes eye contact with examiner.  Oriented to self and age but not year or month.  Follows  simple commands     Results for orders placed or performed during the hospital encounter of 10/06/20 (from the past 48 hour(s))  Glucose, capillary     Status: None   Collection Time: 10/11/20  8:44 AM  Result Value Ref Range   Glucose-Capillary 92 70 - 99 mg/dL    Comment: Glucose reference range applies only to samples taken after fasting for at least 8 hours.  Glucose, capillary     Status: Abnormal   Collection Time: 10/11/20 11:46 AM  Result Value Ref Range   Glucose-Capillary 123 (H) 70 - 99 mg/dL    Comment: Glucose reference range applies only to samples taken after fasting for at least 8 hours.  Glucose, capillary     Status: None   Collection Time: 10/11/20  5:35 PM  Result Value Ref Range   Glucose-Capillary 95 70 - 99 mg/dL    Comment: Glucose reference range applies only to samples taken after fasting for at least 8 hours.  CBC     Status: Abnormal   Collection Time: 10/11/20  6:46 PM  Result Value Ref Range   WBC 8.9 4.0 - 10.5 K/uL   RBC 4.25 4.22 - 5.81 MIL/uL   Hemoglobin 9.8 (L) 13.0 - 17.0 g/dL   HCT 43.3 (L) 39 - 52 %   MCV 75.8 (L) 80.0 - 100.0 fL   MCH 23.1 (L) 26.0 - 34.0 pg   MCHC 30.4 30.0 - 36.0 g/dL   RDW 29.5 (H) 18.8 - 41.6 %   Platelets 217 150 - 400 K/uL   nRBC 1.5 (H) 0.0 - 0.2 %    Comment: Performed at [facility], [address], [place], [place] [address]  Glucose, capillary     Status: Abnormal   Collection Time: 10/11/20  9:00 PM  Result Value Ref Range   Glucose-Capillary 104 (H) 70 - 99 mg/dL    Comment: Glucose reference range applies only to samples taken after fasting for at least 8 hours.  Glucose, capillary     Status: Abnormal   Collection Time: 10/11/20 11:38 PM  Result Value Ref Range   Glucose-Capillary 112 (H) 70 - 99 mg/dL    Comment: Glucose reference range applies only to samples taken after fasting for at least 8 hours.  Glucose, capillary     Status: Abnormal   Collection Time: 10/12/20  4:28 AM  Result Value  Ref Range   Glucose-Capillary 105 (H) 70 - 99 mg/dL    Comment: Glucose reference range applies only to samples taken after fasting for at least 8 hours.  Glucose, capillary     Status: Abnormal   Collection Time: 10/12/20  8:16 AM  Result Value Ref Range   Glucose-Capillary 106 (H) 70 - 99 mg/dL    Comment: Glucose reference range applies only to samples taken after fasting for at least 8 hours.  CBC     Status: Abnormal   Collection Time: 10/12/20  8:55 AM  Result Value Ref Range   WBC 9.8 4.0 - 10.5 K/uL  RBC 3.25 (L) 4.22 - 5.81 MIL/uL   Hemoglobin 7.4 (L) 13.0 - 17.0 g/dL    Comment: Reticulocyte Hemoglobin testing may be clinically indicated, consider ordering this additional test [id number]    HCT 24.6 (L) 39 - 52 %   MCV 75.7 (L) 80.0 - 100.0 fL   MCH 22.8 (L) 26.0 - 34.0 pg   MCHC 30.1 30.0 - 36.0 g/dL   RDW 54.0 (H) 98.1 - 19.1 %   Platelets 229 150 - 400 K/uL   nRBC 0.8 (H) 0.0 - 0.2 %    Comment: Performed at [facility], [address], [place], [place] [address]  Basic metabolic panel     Status: Abnormal   Collection Time: 10/12/20  8:55 AM  Result Value Ref Range   Sodium 146 (H) 135 - 145 mmol/L   Potassium 3.7 3.5 - 5.1 mmol/L   Chloride 118 (H) 98 - 111 mmol/L   CO2 20 (L) 22 - 32 mmol/L   Glucose, Bld 120 (H) 70 - 99 mg/dL    Comment: Glucose reference range applies only to samples taken after fasting for at least 8 hours.   BUN 30 (H) 8 - 23 mg/dL   Creatinine, Ser 5.62 0.61 - 1.24 mg/dL   Calcium 8.1 (L) 8.9 - 10.3 mg/dL   GFR, Estimated >13 >08 mL/min    Comment: (NOTE) Calculated using the CKD-EPI Creatinine Equation (2021)    Anion gap 8 5 - 15    Comment: Performed at [facility], [address], [place], [place] [address]  Type and screen [facility]     Status: None (Preliminary result)   Collection Time: 10/12/20 10:42 AM  Result Value Ref Range   ABO/RH(D) A POS    Antibody Screen NEG    Sample Expiration  10/15/2020,2359    Unit Number [id number]    Blood Component Type RED CELLS,LR    Unit division 00    Status of Unit ISSUED    Transfusion Status OK TO TRANSFUSE    Crossmatch Result      Compatible Performed at [facility], [address], [place], [place] [address]   Glucose, capillary     Status: Abnormal   Collection Time: 10/12/20 12:22 PM  Result Value Ref Range   Glucose-Capillary 116 (H) 70 - 99 mg/dL    Comment: Glucose reference range applies only to samples taken after fasting for at least 8 hours.  Prepare RBC (crossmatch)     Status: None   Collection Time: 10/12/20 12:53 PM  Result Value Ref Range   Order Confirmation      ORDER PROCESSED BY BLOOD BANK Performed at [facility], [address], [place], [place] [address]   Glucose, capillary     Status: Abnormal   Collection Time: 10/12/20  3:54 PM  Result Value Ref Range   Glucose-Capillary 105 (H) 70 - 99 mg/dL    Comment: Glucose reference range applies only to samples taken after fasting for at least 8 hours.  Occult blood card to lab, stool     Status: Abnormal   Collection Time: 10/12/20  5:50 PM  Result Value Ref Range   Fecal Occult Bld POSITIVE (A) NEGATIVE    Comment: Performed at [facility], [address] [place], [place] [address]  Hemoglobin and hematocrit, blood     Status: Abnormal   Collection Time: 10/12/20  6:17 PM  Result Value Ref  Range   Hemoglobin 9.2 (L) 13.0 - 17.0 g/dL   HCT 40.9 (L) 39 - 52 %    Comment: Performed at [facility], [address], [place], [place] [address]  Glucose, capillary     Status: Abnormal   Collection Time: 10/12/20  8:32 PM  Result Value Ref Range   Glucose-Capillary 127 (H) 70 - 99 mg/dL    Comment: Glucose reference range applies only to samples taken after fasting for at least 8 hours.  Glucose, capillary     Status: Abnormal   Collection Time: 10/12/20 11:51 PM  Result Value Ref Range   Glucose-Capillary 117 (H) 70 - 99  mg/dL    Comment: Glucose reference range applies only to samples taken after fasting for at least 8 hours.  CBC     Status: Abnormal   Collection Time: 10/13/20  2:46 AM  Result Value Ref Range   WBC 10.7 (H) 4.0 - 10.5 K/uL   RBC 3.81 (L) 4.22 - 5.81 MIL/uL   Hemoglobin 9.2 (L) 13.0 - 17.0 g/dL   HCT 47.8 (L) 39 - 52 %   MCV 76.6 (L) 80.0 - 100.0 fL   MCH 24.1 (L) 26.0 - 34.0 pg   MCHC 31.5 30.0 - 36.0 g/dL   RDW 29.5 (H) 62.1 - 30.8 %   Platelets 248 150 - 400 K/uL   nRBC 0.8 (H) 0.0 - 0.2 %    Comment: Performed at [facility], [address] [place], [place] [address]  Basic metabolic panel     Status: Abnormal   Collection Time: 10/13/20  2:46 AM  Result Value Ref Range   Sodium 145 135 - 145 mmol/L   Potassium 3.3 (L) 3.5 - 5.1 mmol/L   Chloride 114 (H) 98 - 111 mmol/L   CO2 22 22 - 32 mmol/L   Glucose, Bld 115 (H) 70 - 99 mg/dL    Comment: Glucose reference range applies only to samples taken after fasting for at least 8 hours.   BUN 30 (H) 8 - 23 mg/dL   Creatinine, Ser 6.96 0.61 - 1.24 mg/dL   Calcium 7.9 (L) 8.9 - 10.3 mg/dL   GFR, Estimated >29 >52 mL/min    Comment: (NOTE) Calculated using the CKD-EPI Creatinine Equation (2021)    Anion gap 9 5 - 15    Comment: Performed at [facility], [address] [place], [place] [address]  Glucose, capillary     Status: Abnormal   Collection Time: 10/13/20  4:13 AM  Result Value Ref Range   Glucose-Capillary 116 (H) 70 - 99 mg/dL    Comment: Glucose reference range applies only to samples taken after fasting for at least 8 hours.   No results found.     Medical Problem List and Plan: 1.  Left side weakness dysarthria/dysphagia secondary to right MCA infarction due to right ICA high-grade stenosis status post angioplasty  -patient may *** shower  -ELOS/Goals: *** 2.  Antithrombotics: -DVT/anticoagulation:AWAITING PLAN TO RESUME CHRONIC  XARELTO PER NEUROLOGY  -antiplatelet therapy: Aspirin 81 mg  daily 3. Pain Management: Tylenol as needed 4. Mood: Provide emotional support  -antipsychotic agents: N/A 5. Neuropsych: This patient is capable of making decisions on his own behalf. 6. Skin/Wound Care: Routine skin checks 7. Fluids/Electrolytes/Nutrition: Routine in and outs with follow-up chemistries 8.  Atrial fibrillation/tachybradycardia syndrome/pacemaker.  Cardiac rate controlled.  Continue Toprol 25 mg daily 9.  SCC of the carotid gland.  Follow-up outpatient  10.  Hypertension.  Toprol 25 mg daily.  Monitor with increased mobility 11.  Hypothyroidism.  Synthroid 12.  Bacteremia.  Ancef 2 g every 8 hours and complete 10-day course 13.  Hyperlipidemia.  Lipitor 14.  Dysphagia.  Dysphagia #1 thin liquids.  Follow-up speech therapy 15.CKD stage III.  Baseline 1.30-1.48.  Follow-up chemistries 16.  Acute blood loss anemia with history of GI bleed 2019.  Patient transfused 1 unit packed red blood cells 10/12/2020.  Continue iron supplement.  Protonix 40 mg twice daily.  Follow-up CBC 17.  Prediabetes.  Hemoglobin A1c 5.7.  SSI. ***  Mcarthur Rossetti Angiulli, PA-C 10/13/2020

---

## 2005-04-29 ENCOUNTER — Ambulatory Visit (HOSPITAL_COMMUNITY): Admission: RE | Admit: 2005-04-29 | Discharge: 2005-04-29 | Payer: Self-pay | Admitting: Family Medicine

## 2006-09-25 ENCOUNTER — Ambulatory Visit (HOSPITAL_COMMUNITY): Admission: RE | Admit: 2006-09-25 | Discharge: 2006-09-25 | Payer: Self-pay | Admitting: Family Medicine

## 2006-11-05 ENCOUNTER — Ambulatory Visit (HOSPITAL_COMMUNITY): Admission: RE | Admit: 2006-11-05 | Discharge: 2006-11-05 | Payer: Self-pay | Admitting: Gastroenterology

## 2006-11-05 ENCOUNTER — Encounter (INDEPENDENT_AMBULATORY_CARE_PROVIDER_SITE_OTHER): Payer: Self-pay | Admitting: *Deleted

## 2006-11-05 ENCOUNTER — Ambulatory Visit: Payer: Self-pay | Admitting: Internal Medicine

## 2007-06-09 ENCOUNTER — Ambulatory Visit: Payer: Self-pay | Admitting: Vascular Surgery

## 2007-11-28 HISTORY — PX: CAROTID ENDARTERECTOMY: SUR193

## 2007-12-08 ENCOUNTER — Ambulatory Visit: Payer: Self-pay | Admitting: Vascular Surgery

## 2007-12-15 ENCOUNTER — Ambulatory Visit: Payer: Self-pay | Admitting: Vascular Surgery

## 2007-12-25 ENCOUNTER — Encounter: Payer: Self-pay | Admitting: Vascular Surgery

## 2007-12-25 ENCOUNTER — Ambulatory Visit: Payer: Self-pay | Admitting: Vascular Surgery

## 2007-12-25 ENCOUNTER — Inpatient Hospital Stay (HOSPITAL_COMMUNITY): Admission: RE | Admit: 2007-12-25 | Discharge: 2007-12-26 | Payer: Self-pay | Admitting: Vascular Surgery

## 2008-01-05 ENCOUNTER — Ambulatory Visit: Payer: Self-pay | Admitting: Vascular Surgery

## 2008-07-05 ENCOUNTER — Ambulatory Visit: Payer: Self-pay | Admitting: Vascular Surgery

## 2008-12-27 ENCOUNTER — Ambulatory Visit: Payer: Self-pay | Admitting: Vascular Surgery

## 2010-01-16 ENCOUNTER — Ambulatory Visit: Payer: Self-pay | Admitting: Vascular Surgery

## 2011-01-16 ENCOUNTER — Other Ambulatory Visit: Payer: Self-pay

## 2011-03-15 ENCOUNTER — Other Ambulatory Visit (INDEPENDENT_AMBULATORY_CARE_PROVIDER_SITE_OTHER): Payer: Medicare Other

## 2011-03-15 DIAGNOSIS — Z48812 Encounter for surgical aftercare following surgery on the circulatory system: Secondary | ICD-10-CM

## 2011-03-15 DIAGNOSIS — I6529 Occlusion and stenosis of unspecified carotid artery: Secondary | ICD-10-CM

## 2011-03-20 NOTE — Procedures (Unsigned)
CAROTID DUPLEX EXAM  INDICATION:  Left carotid endarterectomy.  HISTORY: Diabetes:  No. Cardiac:  No. Hypertension:  Yes. Smoking:  No. Previous Surgery:  Left carotid endarterectomy and resection of redundant left CCA on 11/28/2007. CV History:  Currently asymptomatic. Amaurosis Fugax No, Paresthesias No, Hemiparesis No                                      RIGHT             LEFT Brachial systolic pressure:         190               162 Brachial Doppler waveforms:         Normal            Monophasic Vertebral direction of flow:        Antegrade         Bidirectional DUPLEX VELOCITIES (cm/sec) CCA peak systolic                   75                54 ECA peak systolic                   85                50 ICA peak systolic                   123               53 ICA end diastolic                   48                13 PLAQUE MORPHOLOGY:                  Mixed PLAQUE AMOUNT:                      Moderate          None PLAQUE LOCATION:                    ICA / ECA  IMPRESSION: 1. Doppler velocity suggests a 40% to 59% stenosis of the right     proximal to mid internal carotid artery. 2. Patent left carotid endarterectomy site with no left internal     carotid artery stenosis. 3. No significant change noted when compared to the previous exam on     01/16/2010. 4. Incidental finding:  There is a cystic structure with no internal     flow noted in the left lobe of the thyroid measuring 2.6 x 2.0 x     1.3 cm.  ___________________________________________ Quita Skye. Hart Rochester, M.D.  CH/MEDQ  D:  03/18/2011  T:  03/18/2011  Job:  [id number]

## 2011-04-09 NOTE — Assessment & Plan Note (Signed)
OFFICE VISIT   MARVENS, HOLLARS  DOB:  10/02/1927                                       06/09/2007  [id number]#:[id number]   [name] returns for followup regarding known left carotid occlusive  disease.  He continues to deny any neurologic symptoms such as  hemiparesis, aphasia, amaurosis fugax, diplopia, blurred vision, or  syncope.  He does take 1 aspirin per day.  He denies any chest pain,  dyspnea on exertion, PND, or orthopnea and also denies lower extremity  claudication symptoms, remaining quite active for his age.   PHYSICAL EXAMINATION:  VITAL SIGNS:  Blood pressure 152/101 on the left,  172/101 on the right.  Heart rate 63, respirations 18.  NECK:  Carotid pulses are 3+ with a harsh bruit on the left.  No  palpable adenopathy in the neck.  NEUROLOGIC:  Exam normal.  CHEST: Clear to auscultation.  CARDIOVASCULAR:  Regular rhythm with no murmurs.  ABDOMEN: Soft, nontender, with no masses palpable.  EXTREMITIES:  He has 3+ femoral, 2+ dorsalis pedis pulses palpable  bilaterally.   Carotid duplex exam reveals some progression of disease in his left  internal carotid stenosis since his last study 1 year ago, now  approximately 75% with the right internal carotid having mild flow  reduction.   I discussed these findings with him and the fact that if it progresses  any more, we would need to proceed with carotid surgery on the left.  He  also has known left subclavian occlusive disease with retrograde flow  down the left vertebral but no posterior circulation symptoms.   I plan to see him in 6 months with a followup carotid duplex exam at  that time unless he develops symptoms in the interim.   Quita Skye Hart Rochester, M.D.  Electronically Signed   JDL/MEDQ  D:  06/09/2007  T:  06/10/2007  Job:  [id number]   cc:   Angus G. Renard Matter, MD

## 2011-04-09 NOTE — Assessment & Plan Note (Signed)
OFFICE VISIT   Fernando [name], Fernando [name]  DOB:  1927/09/01                                       01/05/2008  EAVWU#:[id number]   The patient is status post left carotid endarterectomy 2 weeks ago for  severe but asymptomatic left internal carotid stenosis.  He has been  doing well since surgery with no neurological complications or specific  symptoms.  He is swallowing well.  Has no hoarseness, and denies any  hemispheric or non-hemispheric TIA, amaurosis fugax, diplopia, blurred  vision, or syncope.  He is on an aspirin a day.   EXAM:  Blood pressure 116/83, heart rate 97, respirations 16.  Left neck  incision has healed nicely.  Carotid pulses are 3+ with a soft bruit on  the left.  Neurological exam is normal.   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  He  will return to see [place] in 6 months for followup carotid duplex exam.  He  does have contralateral 40-50% right internal carotid artery stenosis,  which is asymptomatic.   Quita Skye Hart Rochester, M.D.  Electronically Signed   JDL/MEDQ  D:  01/05/2008  T:  01/07/2008  Job:  [id number]   cc:   Angus G. Renard Matter, MD

## 2011-04-09 NOTE — Discharge Summary (Signed)
Fernando [name], Fernando [name]              ACCOUNT NO.:  [id number]   MEDICAL RECORD NO.:  [id number]          PATIENT TYPE:  INP   LOCATION:  2550                         FACILITY:  [facility]   PHYSICIAN:  Quita Skye. Hart Rochester, M.D.  DATE OF BIRTH:  01-23-1927   DATE OF ADMISSION:  12/25/2007  DATE OF DISCHARGE:                               DISCHARGE SUMMARY   DATE OF DISCHARGE:  Indicated for December 26, 2007 if the patient  continues to remain stable throughout the night.   ADMISSION DIAGNOSIS:  Severe left internal carotid stenosis,  asymptomatic.   DISCHARGE DIAGNOSES:  1. Severe left internal carotid stenosis, asymptomatic.  2. Redundant left internal carotid artery and left common carotid      artery.   CONSULTATIONS:  None.   PROCEDURES:  1. Resection of redundant left CCA with primary reanastomosis.  2. Left carotid endarterectomy with Dacron patch angioplasty.   HISTORY AND PHYSICAL:  This is a 77 year old male patient who has been  followed by Dr. Hart Rochester for carotid occlusive disease since July 2006.  Initially, this was 60% in severity but slowly progressed to its current  state of 90% in severity on the left side with mild disease on the  contralateral right side.  The patient denies any symptoms on office  visit including hemispheric or non-hemispheric TIAs, amaurosis fugax,  diplopia, blurred vision, or syncope.  He has had no previous history of  stroke.  At this time, impression of the severe left carotid stenosis  which was asymptomatic  and felt it was best if plan to admit the  patient on January 30th for an elective left carotid endarterectomy.  Dr. Hart Rochester discussed the risks and benefits with the patient and his  daughter and the patient would like to proceed.   HOSPITAL COURSE:  The patient was admitted to [facility] on  December 25, 2007, to proceed with an elective left carotid  endarterectomy by Dr. Hart Rochester on December 25, 2007.  The patient signed  consent  to do so and was taken to the OR.  The patient remained stable  throughout the procedure in the OR and was transferred to the PACU,  where he continued to remain stable.  The patient then was transferred  to 4300 where he continued to remain stable overnight and be discharged  in the morning.  At this time the patient's vital signs are stable.  The patient is not  experiencing any nausea or vomiting.  No deviation of the tongue.  Neurologically, the patient is intact with no visual changes.  The  patient has also got good strength and good sensation bilaterally of the  feet and arms.  The patient denies any headaches at this time.   DISCHARGE INSTRUCTIONS:   MEDICATIONS:  1. Aspirin 81 mg.  2. Benicar/hydrochlorothiazide 20/12.5 mg daily in the p.m.  3. Tylox 1-2 tablets every 6 hours or as needed for pain.   DISCHARGE INSTRUCTIONS:  I discussed with the patient.  The patient will  be discharged on December 26, 2007.  The patient agrees to these  instructions and states that  he understands his instructions.  The patient was instructed to clean his incision gently with soap and  water.  The patient was instructed to increase activity slowly, may not lift or  drive for up to 2 weeks.  The patient also may shower and bath starting on December 27, 2007.  The  patient was instructed that he needs to contact if he has a fever  greater than 101, any redness or drainage from the incision, any onset  of severe headache or new speech or visual changes.  The patient is instructed to follow up with Dr. Hart Rochester in 2 weeks, and  the office will contact to schedule.  The patient is also instructed to continue meds per home reconciliation  sheet along with a prescription for Tylox 1-2 tablets every 6 hours as  needed for pain.  Again, the patient states he understands these instructions and he will  be indicated for discharge on December 26, 2007, as long as the patient  continues to remain afebrile  and vitals stable.      Cyndy Freeze, PA      Quita Skye. Hart Rochester, M.D.  Electronically Signed    ALW/MEDQ  D:  12/25/2007  T:  12/25/2007  Job:  [id number]

## 2011-04-09 NOTE — Procedures (Signed)
CAROTID DUPLEX EXAM   INDICATION:  Followup of known carotid artery disease.  Patient is  currently asymptomatic.   HISTORY:  Diabetes:  No.  Cardiac:  No.  Hypertension:  No.  Smoking:  No.  Previous Surgery:  Left CEA with DPA and resection of redundant left CCA  with primary reanastomosis on 11/28/07 by Dr. Hart Rochester.  CV History:  Amaurosis Fugax No, Paresthesias No, Hemiparesis No.                                       RIGHT             LEFT  Brachial systolic pressure:         170               108  Brachial Doppler waveforms:         Triphasic         Monophasic  Vertebral direction of flow:        Antegrade         Retrograde  DUPLEX VELOCITIES (cm/sec)  CCA peak systolic                   75                69  ECA peak systolic                   73                90  ICA peak systolic                   141               34  ICA end diastolic                   55                16  PLAQUE MORPHOLOGY:                  Mixed, calcific   N/A  PLAQUE AMOUNT:                      Mild/moderate     N/A  PLAQUE LOCATION:                    DCCA, ICA         N/A   IMPRESSION:  1. Right 40-59% internal carotid artery stenosis, which is stable from      previous study.  2. Left internal carotid artery without recurrent stenosis, status      post carotid endarterectomy.  3. Unable to obtain elevated velocity of left external carotid artery      from previous study.  4. Left vertebral artery with retrograde flow, known subclavian steal.  5. Right ICA velocity is stable from study on 12/08/07.     ___________________________________________  Quita Skye. Hart Rochester, M.D.   PB/MEDQ  D:  07/05/2008  T:  07/05/2008  Job:  [id number]

## 2011-04-09 NOTE — Op Note (Signed)
NAMEPLACIDO, Fernando [name]              ACCOUNT NO.:  [id number]   MEDICAL RECORD NO.:  [id number]          PATIENT TYPE:  INP   LOCATION:  3308                         FACILITY:  [facility]   PHYSICIAN:  Quita Skye. Hart Rochester, M.D.  DATE OF BIRTH:  1927/02/25   DATE OF PROCEDURE:  12/25/2007  DATE OF DISCHARGE:                               OPERATIVE REPORT   PREOPERATIVE DIAGNOSIS:  Severe left internal carotid stenosis -  asymptomatic.   POSTOPERATIVE DIAGNOSIS:  Severe left internal carotid stenosis -  asymptomatic plus redundant left internal carotid artery with kinking.   OPERATION:  1. Left carotid endarterectomy with Dacron patch angioplasty.  2. Resection of redundant left common carotid artery with primary      reanastomosis.   SURGEON:  Quita Skye. Hart Rochester, M.D.   FIRST ASSISTANT:  Jerold Coombe, P.A.   ANESTHESIA:  General endotracheal.   BRIEF HISTORY:  This patient had been followed for carotid occlusive  disease since July of 2006 with 60% stenosis which gradually progressed  to a 90% stenosis in the left internal carotid.  He had mild disease in  the contralateral right side and was asymptomatic, scheduled for left  carotid endarterectomy.   PROCEDURE IN DETAIL:  The patient was taken to the operating room and  placed in the supine position at which time satisfactory general  endotracheal anesthesia was administered.  The left neck was prepped  with Betadine scrub and solution and draped in routine sterile manner.  An incision was made along the anterior border of the  sternocleidomastoid muscle carried down through subcutaneous tissue and  platysma using the Bovie.  The common facial vein and external jugular  veins were ligated with 3-0 silk ties, divided, exposing the common  internal and external carotid arteries.  Care was taken not to injure  the vagus or hypoglossal nerves both which were exposed.  There was a  calcified atherosclerotic plaque at the carotid  bifurcation extending up  the internal carotid about 3-4 cm distal vessel appeared normal.  A #10  shunt was repaired and the patient was heparinized.  The carotid vessels  were occluded with vascular clamps, longitudinal opening made in the  common carotid with a 15 blade extended up the internal carotid with the  Potts scissors to a point distal to the disease.  A #10 shunt was  inserted without difficulty reestablishing flow in about 2 minutes.  The  lesion was about 90% stenotic in severity.  The distal vessel appeared  normal and had sluggish back bleeding.  Standard endarterectomy was then  performed using the elevator and the Potts scissors with an eversion  endarterectomy of the external carotid.  The plaque feathered off distal  internal carotid artery nicely not requiring any tacking sutures.  It  was noted when the carotid arteries were dissected free initially that  there was redundancy and some kinking of the internal carotid artery and  it was felt that this was high risk for kinking after placing the patch.  Therefore a segment of common carotid artery about 1.5 to 2 cm in length  was  resected using four 6-0 Prolene stay sutures as markers and after  resecting the vessel a primary end-to-end anastomosis was done with 6-0  Prolene.  Dacron patch was then sewn into place.  Following antegrade  and retrograde flushing shunt was removed, closure completed,  reestablishing flow initially up the external and the internal branch.  The carotid was occluded for less than 2 minutes for removal of the  shunt.  Protamine was then  given to reverse the heparin.  Adequate hemostasis was achieved.  There  was no kinking of the internal carotid artery following the procedure.  The wound was closed in layers with Vicryl in subcuticular fashion.  Sterile dressing applied.  The patient was taken to the recovery room in  satisfactory condition.      Quita Skye Hart Rochester, M.D.  Electronically  Signed     JDL/MEDQ  D:  12/25/2007  T:  12/25/2007  Job:  [id number]

## 2011-04-09 NOTE — H&P (Signed)
HISTORY AND PHYSICAL EXAMINATION   December 15, 2007   Re:  Fernando [name], Fernando [name]                DOB:  Dec 12, 1926   CHIEF COMPLAINT:  Severe left internal carotid stenosis - asymptomatic.   HISTORY OF PRESENT ILLNESS:  This 77 year old male patient has been  followed by Dr. Hart Rochester for carotid occlusive disease since July of 2006.  Initially this was 60% in severity but slowly progressed to its current  state of 90% in severity on the left side with mild disease on the  contralateral right side.  The patient denies any symptoms including  hemispheric or nonhemispheric TIAs, amaurosis fugax, diplopia, blurred  vision or syncope.  He has no previous history of stroke.   PAST MEDICAL HISTORY:  1. Hyperlipidemia.  2. Denies any history of diabetes, coronary artery disease, COPD,      hypertension or stroke.   PAST SURGICAL HISTORY:  1. Bilateral hernia repair.  2. Hand surgery for fracture.  3. Appendectomy.   FAMILY HISTORY:  Positive for coronary artery disease in his sister who  had coronary artery bypass grafting in the past.  Negative for stroke  and diabetes.   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  He has not smoked in 30 years.  He does not use alcohol.   REVIEW OF SYSTEMS:  Denies any weight loss, anorexia, chills, fever,  chest pain, dyspnea on exertion, PND, orthopnea, hemoptysis, bronchitis,  GI or GU symptoms, lower extremity claudication, neurologic symptoms,  basically a negative review of systems.   ALLERGIES:  None known.   MEDICATIONS:  Aspirin 81 mg one daily.   PHYSICAL EXAMINATION:  Vital signs:  Blood pressure 188/108, heart rate  74, respirations 18.  General:  He is a healthy-appearing male in no  apparent distress.  Alert and oriented x3.  Neck:  His neck was supple.  3+ carotid pulse is palpable.  There is a harsh bruit on the left side,  a soft bruit on the right.  Neurologic:  Exam is normal.   There is no  palpable adenopathy in the neck.  No skin rashes are noted.  Upper  extremity pulses are 3+ at the brachial and radial level.  Chest:  Clear  to auscultation.  Cardiovascular:  Exam reveals a regular rhythm with no  murmurs.  Abdomen:  Soft, nontender, with no palpable masses.  There are  3+ femoral pulses bilaterally with well perfused lower extremities and  no distal edema.   IMPRESSION:  1. Severe left internal carotid stenosis - asymptomatic.  2. Hyperlipidemia.   PLAN:  The plan is to admit the patient on January 30 for an elective  left carotid endarterectomy.  The risks and benefits have been  thoroughly discussed with the patient and his daughter and he would like  to proceed.   Quita Skye Hart Rochester, M.D.  Electronically Signed   JDL/MEDQ  D:  12/15/2007  T:  12/16/2007  Job:  [id number]   cc:   Angus G. Renard Matter, MD

## 2011-04-09 NOTE — Assessment & Plan Note (Signed)
OFFICE VISIT   Fernando [name], Fernando [name]  DOB:  02-Jul-1927                                       12/27/2008  EAVWU#:[id number]   The patient returns for continued followup regarding his carotid  occlusive disease having had a left carotid endarterectomy plus  resection of a redundant vessel and reanastomosis performed 1 year ago  for an asymptomatic lesion.  He has had no neurologic complications or  symptoms including hemiparesis, aphasia, amaurosis fugax, diplopia,  blurred vision or syncope.  He also denies any chest pain, dyspnea on  exertion, PND, orthopnea or claudication symptoms and denies upper  extremity claudication.  He has had no dizziness or blurred vision.  He  does take one aspirin per day.   Vital signs:  His blood pressure is 174/90 on the right, 128/80 on the  left.  Neck:  His carotid pulses are 3+ with a soft bruit on the left.  No bruit on the right.  Neurological:  Normal.  Chest:  Clear to  auscultation.  Abdomen:  Soft, nontender with no masses.  He has 3+  femoral pulses palpable bilaterally.   Carotid duplex exam today reveals widely patent left carotid  endarterectomy site and a mild stable stenosis in the right internal  carotid approximating 40-50%.  He does have a severe left subclavian  disease with retrograde flow on the left vertebral which is stable and  has been unchanged for several years.   I think he is stable and we will continue to follow him on an annual  basis unless he develops any symptoms.  I plan to see him back in 1 year  on the carotid protocol.   Quita Skye Hart Rochester, M.D.  Electronically Signed   JDL/MEDQ  D:  12/27/2008  T:  12/28/2008  Job:  [id number]

## 2011-04-09 NOTE — Procedures (Signed)
CAROTID DUPLEX EXAM   INDICATION:  Follow up carotid artery disease and left carotid  endarterectomy.   HISTORY:  Diabetes:  No.  Cardiac:  No.  Hypertension:  No.  Smoking:  No.  Previous Surgery:  Left carotid endarterectomy and resection of  redundant left common carotid artery on 11/28/07.  CV History:  Currently asymptomatic.  Amaurosis Fugax No, Paresthesias No, Hemiparesis No.                                       RIGHT             LEFT  Brachial systolic pressure:         178               128  Brachial Doppler waveforms:         Normal            Monophasic  Vertebral direction of flow:        Antegrade         Retrograde  DUPLEX VELOCITIES (cm/sec)  CCA peak systolic                   133               79  ECA peak systolic                   86                75  ICA peak systolic                   127               44  ICA end diastolic                   48                13  PLAQUE MORPHOLOGY:                  Mixed  PLAQUE AMOUNT:                      Moderate          None  PLAQUE LOCATION:                    ICA/CCA   IMPRESSION:  1. 40-59% stenosis of the right internal carotid artery.  2. Patent left carotid endarterectomy site with no evidence of      stenosis in the left internal carotid artery noted.  3. Left subclavian steal syndrome is noted.  4. No significant change noted when compared to the previous      examination on 07/05/08.  5. Incidental finding:  Large nonvascularized cystic structure noted,      which appears to originate from the left lobe of the thyroid      measuring 2.6 X 1.6 X 1.7 cm.   ___________________________________________  Quita Skye. Hart Rochester, M.D.   CH/MEDQ  D:  12/27/2008  T:  12/27/2008  Job:  [id number]

## 2011-04-09 NOTE — Procedures (Signed)
CAROTID DUPLEX EXAM   INDICATION:  Follow up known carotid artery disease.   HISTORY:  Diabetes:  No.  Cardiac:  No.  Hypertension:  No.  Smoking:  Quit over 30 years ago.  Previous Surgery:  No.  CV History:  No.  Amaurosis Fugax  No, Paresthesias No, Hemiparesis No.  Left subclavian steal.                                       RIGHT             LEFT  Brachial systolic pressure:         175.              114.  Brachial Doppler waveforms:         Triphasic.        Monophasic.  Vertebral direction of flow:        Antegrade.        Retrograde.  DUPLEX VELOCITIES (cm/sec)  CCA peak systolic                   71.               53.  ECA peak systolic                   89.               211.  ICA peak systolic                   103.              303.  ICA end diastolic                   38.               81.  PLAQUE MORPHOLOGY:                  Calcified.        Calcified with  shadows.  PLAQUE AMOUNT:                      Mild.             Moderate/severe.  PLAQUE LOCATION:                    CCA/ICA.          CCA/ICA/ECA.   IMPRESSION:  1. The right ICA shows evidence of 20-39% stenosis.  2. The left ICA shows evidence of 60-79% stenosis (higher end of      range).  3. The left ECA is stenotic.  4. The left vertebral artery is retrograde as previously documented.      Right is antegrade.   ___________________________________________  Quita Skye. Hart Rochester, M.D.   AS/MEDQ  D:  06/09/2007  T:  06/10/2007  Job:  [id number]

## 2011-04-09 NOTE — Procedures (Signed)
CAROTID DUPLEX EXAM   INDICATION:  Follow-up carotid artery disease.   HISTORY:  Diabetes:  No.  Cardiac:  No.  Hypertension:  No.  Smoking:  Quit.  Previous Surgery:  No.  CV History:  Asymptomatic.  Amaurosis Fugax No, Paresthesias No, Hemiparesis No.  Known left subclavian steal.                                       RIGHT             LEFT  Brachial systolic pressure:         176               150  Brachial Doppler waveforms:         Triphasic         Monophasic  Vertebral direction of flow:        Antegrade         Retrograde  DUPLEX VELOCITIES (cm/sec)  CCA peak systolic                   84                62  ECA peak systolic                   82                207  ICA peak systolic                   143               350  ICA end diastolic                   48                141  PLAQUE MORPHOLOGY:                  Calcific          Mixed/calcific  PLAQUE AMOUNT:                      Mild/moderate     Severe  PLAQUE LOCATION:                    ICA/CCA           ICA/ECA/CCA   IMPRESSION:  1. Right internal carotid artery shows evidence of 40-59% stenosis.  2. Left internal carotid artery shows evidence of 80-99% stenosis.  3. Left external carotid artery stenosis.  4. Left vertebral artery is retrograde.  Right is antegrade.  5. Bilateral internal carotid artery velocity increases from previous      study.  6. Cyst-like structure noted in thyroid on left measuring 1.37 cm X      1.77 cm.   Dr. Edilia Bo was informed, with a follow-up appointment to see Dr. Hart Rochester  on 12/15/2007.   ___________________________________________  Quita Skye. Hart Rochester, M.D.   AS/MEDQ  D:  12/08/2007  T:  12/08/2007  Job:  [id number]

## 2011-04-09 NOTE — Procedures (Signed)
CAROTID DUPLEX EXAM   INDICATION:  Follow up carotid artery disease.   HISTORY:  Diabetes:  No.  Cardiac:  No.  Hypertension:  Yes.  Smoking:  No.  Previous Surgery:  Left carotid endarterectomy and resection of  redundant left common carotid artery on 11/28/07.  CV History:  No.  Amaurosis Fugax No, Paresthesias No, Hemiparesis No.                                       RIGHT             LEFT  Brachial systolic pressure:         140               98  Brachial Doppler waveforms:         WNL               Monophasic  Vertebral direction of flow:        Antegrade         Retrograde  DUPLEX VELOCITIES (cm/sec)  CCA peak systolic                   83                60  ECA peak systolic                   65                69  ICA peak systolic                   132               79  ICA end diastolic                   43                27  PLAQUE MORPHOLOGY:                  Heterogenous  PLAQUE AMOUNT:                      Mild  PLAQUE LOCATION:                    ICA, CCA   IMPRESSION:  1. Right internal carotid artery suggests 40-59% stenosis.  2. Patent left internal carotid artery, status post carotid      endarterectomy.  3. Retrograde flow in left vertebrals.   ___________________________________________  Quita Skye Hart Rochester, M.D.   CB/MEDQ  D:  01/16/2010  T:  01/17/2010  Job:  [id number]

## 2011-04-12 NOTE — Op Note (Signed)
Fernando [name], [name]              ACCOUNT NO.:  [id number]   MEDICAL RECORD NO.:  [id number]          PATIENT TYPE:  AMB   LOCATION:  DAY                           FACILITY:  [facility]   PHYSICIAN:  R. Roetta Sessions, M.D. DATE OF BIRTH:  01/29/1927   DATE OF PROCEDURE:  11/05/2006  DATE OF DISCHARGE:                               OPERATIVE REPORT   PROCEDURE:  Colonoscopy with cold hot snare polypectomy.   INDICATIONS FOR PROCEDURE:  The patient is a 77 year old gentleman  devoid of any GI symptoms, referred through courtesy of Dr. Butch Penny for colorectal cancer screening via colonoscopy.   He has never had his lower GI tract imaged.  There is no family history  of colorectal neoplasia.  Colonoscopy is now being done as screening  maneuver.  This approach has discussed with the patient at length.  Potential risks, benefits and alternatives have been reviewed, questions  answered.  She is agreeable.  Please see documentation in medical  record.   PROCEDURE NOTE:  O2 saturation, blood pressure, pulse and respirations  were monitored throughout entire procedure.   CONSCIOUS SEDATION:  Versed 2 mg IV, Demerol 50 mg IV in a single dose.   INSTRUMENT USED:  Olympus video chip system.   FINDINGS:  Digital rectal exam revealed no abnormalities.   ENDOSCOPIC FINDINGS:  The prep was excellent.  Colon:  Colonic mucosa was surveyed from rectosigmoid junction through  the left, transverse, right colon to area of appendiceal orifice,  ileocecal valve and cecum.  These structures well seen and photographed  for the record.  From this level, the scope slowly and cautiously  withdrawn.  All previously mentioned mucosal surfaces were again seen.  The patient had sigmoid diverticula.  The patient had a 4 mm  pedunculated polyp in the proximal transverse colon which was cold  snared, recovered the scope.  There is a second 6 mm pedunculated polyp  in the mid descending colon which was removed  via hot snare and  recovered through the scope.  The remainder colon mucosa appeared  normal.  Scope was pulled down the rectum where thorough examination of  rectal mucosa including retroflex view anal verge was undertaken.  The  rectal mucosa appeared normal.  It was notable that the patient did in  fact have a couple of external hemorrhoidal tags.  The patient tolerated  the procedure well, was reacted in endoscopy.   IMPRESSION:  Couple of external hemorrhoidal tags, otherwise normal  rectum.  Sigmoid diverticula.  Pedunculated polyps in the left and right  colon removed as described above.  Remainder colon mucosa appeared  normal.   RECOMMENDATIONS:  1. Diverticulosis literature provided Fernando [name].  2. No aspirin or is medications for 10 days.  3. Follow-up on path.  4. Further recommendations to follow.      Fernando [name], M.D.  Electronically Signed     RMR/MEDQ  D:  11/05/2006  T:  11/05/2006  Job:  [id number]   cc:   Angus G. Renard Matter, MD  Fax: [phone]

## 2011-08-16 LAB — BASIC METABOLIC PANEL
BUN: 16
CO2: 26
Calcium: 8.1 — ABNORMAL LOW
Chloride: 103
Creatinine, Ser: 0.94
GFR calc Af Amer: >60
GFR calc non Af Amer: >60
Glucose, Bld: 133 — ABNORMAL HIGH
Potassium: 3.7
Sodium: 135

## 2011-08-16 LAB — TYPE AND SCREEN: Antibody Screen: NEGATIVE

## 2011-08-16 LAB — URINALYSIS, ROUTINE W REFLEX MICROSCOPIC
Bilirubin Urine: NEGATIVE
Glucose, UA: NEGATIVE
Hgb urine dipstick: NEGATIVE
Ketones, ur: 15 — AB
Nitrite: NEGATIVE
Protein, ur: NEGATIVE
Specific Gravity, Urine: 1.023
Urobilinogen, UA: 0.2
pH: 5

## 2011-08-16 LAB — APTT: aPTT: 27

## 2011-08-16 LAB — COMPREHENSIVE METABOLIC PANEL
ALT: 24
AST: 25
Albumin: 4.1
Alkaline Phosphatase: 61
CO2: 25
Chloride: 104
GFR calc Af Amer: >60
GFR calc non Af Amer: >60
Potassium: 4.1
Sodium: 137
Total Bilirubin: 1.2

## 2011-08-16 LAB — CBC
HCT: 43.1
Hemoglobin: 14.9
MCHC: 34.7
MCV: 93.9
MCV: 93.9
Platelets: 171
RBC: 4.59
RBC: 5.65
RDW: 13.5
WBC: 9
WBC: 9.2

## 2011-08-16 LAB — ABO/RH: ABO/RH(D): A POS

## 2012-03-09 ENCOUNTER — Encounter: Payer: Self-pay | Admitting: Vascular Surgery

## 2012-03-18 ENCOUNTER — Encounter: Payer: Self-pay | Admitting: Neurosurgery

## 2012-03-19 ENCOUNTER — Encounter: Payer: Self-pay | Admitting: Neurosurgery

## 2012-03-19 ENCOUNTER — Ambulatory Visit (INDEPENDENT_AMBULATORY_CARE_PROVIDER_SITE_OTHER): Payer: Medicare Other | Admitting: Neurosurgery

## 2012-03-19 ENCOUNTER — Ambulatory Visit (INDEPENDENT_AMBULATORY_CARE_PROVIDER_SITE_OTHER): Payer: Medicare Other | Admitting: Vascular Surgery

## 2012-03-19 VITALS — BP 190/97 | HR 81 | Resp 16 | Ht 66.0 in | Wt 174.0 lb

## 2012-03-19 DIAGNOSIS — Z48812 Encounter for surgical aftercare following surgery on the circulatory system: Secondary | ICD-10-CM

## 2012-03-19 DIAGNOSIS — I6529 Occlusion and stenosis of unspecified carotid artery: Secondary | ICD-10-CM

## 2012-03-19 NOTE — Progress Notes (Signed)
[facility] [place] HISTORY AND PHYSICAL   CC: Annual carotid duplex for known stenosis Referring Physician: Hart Rochester  History of Present Illness: This is 78 year old gentleman is a patient of Dr. Hart Rochester and underwent a left carotid endarterectomy January 2009. Patient's reported no problems since that time. He has no signs or symptoms of CVA, TIA, diplopia, dysphagia, amaurosis fugax or word finding difficulties.  Past Medical History  Diagnosis Date  . Carotid artery occlusion   . Hyperlipidemia     ROS: [x]  Positive   [ ]  Denies    General: [ ]  Weight loss, [ ]  Fever, [ ]  chills Neurologic: [ ]  Dizziness, [ ]  Blackouts, [ ]  Seizure [ ]  Stroke, [ ]  "Mini stroke", [ ]  Slurred speech, [ ]  Temporary blindness; [ ]  weakness in arms or legs, [ ]  Hoarseness Cardiac: [ ]  Chest pain/pressure, [ ]  Shortness of breath at rest [ ]  Shortness of breath with exertion, [ ]  Atrial fibrillation or irregular heartbeat Vascular: [ ]  Pain in legs with walking, [ ]  Pain in legs at rest, [ ]  Pain in legs at night,  [ ]  Non-healing ulcer, [ ]  Blood clot in vein/DVT,   Pulmonary: [ ]  Home oxygen, [ ]  Productive cough, [ ]  Coughing up blood, [ ]  Asthma,  [ ]  Wheezing Musculoskeletal:  [ ]  Arthritis, [ ]  Low back pain, [ ]  Joint pain Hematologic: [ ]  Easy Bruising, [ ]  Anemia; [ ]  Hepatitis Gastrointestinal: [ ]  Blood in stool, [ ]  Gastroesophageal Reflux/heartburn, [ ]  Trouble swallowing Urinary: [ ]  chronic Kidney disease, [ ]  on HD - [ ]  MWF or [ ]  TTHS, [ ]  Burning with urination, [ ]  Difficulty urinating Skin: [ ]  Rashes, [ ]  Wounds Psychological: [ ]  Anxiety, [ ]  Depression   Social History History  Substance Use Topics  . Smoking status: Former Smoker    Types: Cigarettes    Quit date: 11/25/1972  . Smokeless tobacco: Not on file  . Alcohol Use: 0.6 oz/week    1 Cans of beer per week    Family History Family History  Problem Relation Age of Onset  . Heart disease  Sister     No Known Allergies  Current Outpatient Prescriptions  Medication Sig Dispense Refill  . aspirin 81 MG tablet Take 81 mg by mouth daily.      Marland Kitchen atorvastatin (LIPITOR) 40 MG tablet Take 40 mg by mouth daily.      Marland Kitchen LOSARTAN POTASSIUM PO Take 500 mg by mouth daily.        Physical Examination  Filed Vitals:   03/19/12 1521  BP: 190/97  Pulse: 81  Resp: 16    Body mass index is 28.08 kg/(m^2).  General:  WDWN in NAD Gait: Normal HEENT: WNL Eyes: Pupils equal Pulmonary: normal non-labored breathing , without Rales, rhonchi,  wheezing Cardiac: RRR, without  Murmurs, rubs or gallops; Abdomen: soft, NT, no masses Skin: no rashes, ulcers noted  Vascular Exam Pulses: 2+ radial pulses bilaterally Carotid bruits: There are audible pulses bilaterally in the carotids with a mild bruit on the right. Extremities without ischemic changes, no Gangrene , no cellulitis; no open wounds;  Musculoskeletal: no muscle wasting or atrophy   Neurologic: A&O X 3; Appropriate Affect ; SENSATION: normal; MOTOR FUNCTION:  moving all extremities equally. Speech is fluent/normal  Non-Invasive Vascular Imaging CAROTID DUPLEX 03/19/2012  Right ICA 60 - 79 % stenosis Left ICA 0 - 19% stenosis   ASSESSMENT/PLAN: Assessment as  above, I explained to the patient and his son that in his 55s 79% range on the right our surveillance will be a six-month followup. The patient's requested to come back in one year and does not want a six-month followup. I did explain to the patient and his son that if the signs or symptoms of CVA should arise they should get to the nearest emergency room as soon as possible and they stated understanding. Their questions were encouraged and answered.  Lauree Chandler ANP   Clinic MD: Darrick Penna

## 2012-03-24 NOTE — Procedures (Unsigned)
CAROTID DUPLEX EXAM  INDICATION:  Carotid stenosis.  HISTORY: Diabetes:  No Cardiac:  No Hypertension:  Yes Smoking:  No Previous Surgery:  Left carotid endarterectomy and resection of redundant left common carotid artery on 11/28/2007 CV History:  Currently asymptomatic Amaurosis Fugax No, Paresthesias No, Hemiparesis No                                      RIGHT             LEFT Brachial systolic pressure:         210               122 Brachial Doppler waveforms:         WNL               Monophasic Vertebral direction of flow:        Antegrade         Retrograde DUPLEX VELOCITIES (cm/sec) CCA peak systolic                   83                71 ECA peak systolic                   95                64 ICA peak systolic                   158-P/M           50 ICA end diastolic                   66-P/M            8.6 PLAQUE MORPHOLOGY:                  Calcified         Homogenous PLAQUE AMOUNT:                      Moderate to severe                  Mild PLAQUE LOCATION:                    CCA/ICA           CCA/bulb  IMPRESSION: 1. Right internal carotid artery stenosis present in the 60 to 79%     range; this may be underestimated due to presence of calcific     plaque making Doppler interrogation difficult. 2. Bilateral external carotid arteries appear patent. 3. Left internal carotid artery is patent, with a history of     endarterectomy, mild hyperplasia noted involving the proximal to     mid endarterectomy site, no hemodynamic change is evident. 4. Patent and antegrade right vertebral, retrograde left vertebral,     with difference of more than 20 mmHg of the bilateral brachial     pressures, and monophasic left subclavian artery which may suggest     subclavian steal syndrome. 5. Increase in the disease process on the right, and stable on the     left, since previous study on  03/15/2011.     ___________________________________________ Quita Skye. Hart Rochester, M.D.  SH/MEDQ  D:  03/19/2012  T:  03/19/2012  Job:  [id number]

## 2012-08-06 ENCOUNTER — Ambulatory Visit (INDEPENDENT_AMBULATORY_CARE_PROVIDER_SITE_OTHER): Payer: Medicare Other | Admitting: Otolaryngology

## 2012-08-06 DIAGNOSIS — D37039 Neoplasm of uncertain behavior of the major salivary glands, unspecified: Secondary | ICD-10-CM

## 2012-08-14 ENCOUNTER — Other Ambulatory Visit (INDEPENDENT_AMBULATORY_CARE_PROVIDER_SITE_OTHER): Payer: Self-pay | Admitting: Otolaryngology

## 2012-08-14 DIAGNOSIS — R221 Localized swelling, mass and lump, neck: Secondary | ICD-10-CM

## 2012-08-17 ENCOUNTER — Encounter (HOSPITAL_COMMUNITY): Payer: Self-pay

## 2012-08-17 ENCOUNTER — Ambulatory Visit (HOSPITAL_COMMUNITY)
Admission: RE | Admit: 2012-08-17 | Discharge: 2012-08-17 | Disposition: A | Discharge status: Home / Self Care | Payer: Medicare Other | Source: Ambulatory Visit | Attending: Otolaryngology | Admitting: Otolaryngology

## 2012-08-17 DIAGNOSIS — K119 Disease of salivary gland, unspecified: Secondary | ICD-10-CM | POA: Insufficient documentation

## 2012-08-17 DIAGNOSIS — R220 Localized swelling, mass and lump, head: Secondary | ICD-10-CM | POA: Insufficient documentation

## 2012-08-17 DIAGNOSIS — R221 Localized swelling, mass and lump, neck: Secondary | ICD-10-CM | POA: Insufficient documentation

## 2012-08-17 DIAGNOSIS — E0789 Other specified disorders of thyroid: Secondary | ICD-10-CM | POA: Insufficient documentation

## 2012-08-17 LAB — POCT I-STAT, CHEM 8
BUN: 30 mg/dL — ABNORMAL HIGH (ref 6–23)
Calcium, Ion: 1.17 mmol/L (ref 1.13–1.30)
Chloride: 108 mEq/L (ref 96–112)
Glucose, Bld: 98 mg/dL (ref 70–99)
TCO2: 24 mmol/L (ref 0–100)

## 2012-08-17 MED ORDER — IOHEXOL 300 MG/ML  SOLN
75.0000 mL | Freq: Once | INTRAMUSCULAR | Status: AC | PRN
  Administered 2012-08-17: 75 mL via INTRAVENOUS

## 2012-08-17 MED ORDER — IOHEXOL 300 MG/ML  SOLN: 100.0000 mL | Freq: Once | INTRAMUSCULAR | Status: AC | PRN

## 2012-08-17 NOTE — Progress Notes (Signed)
Blood sample obtained from right arm IV for Creatnine level.  

## 2012-08-20 ENCOUNTER — Ambulatory Visit (INDEPENDENT_AMBULATORY_CARE_PROVIDER_SITE_OTHER): Payer: Medicare Other | Admitting: Otolaryngology

## 2012-08-20 DIAGNOSIS — D37039 Neoplasm of uncertain behavior of the major salivary glands, unspecified: Secondary | ICD-10-CM

## 2012-08-21 ENCOUNTER — Other Ambulatory Visit: Payer: Self-pay | Admitting: Otolaryngology

## 2012-08-31 ENCOUNTER — Encounter (HOSPITAL_COMMUNITY): Payer: Self-pay

## 2012-09-04 ENCOUNTER — Other Ambulatory Visit: Payer: Self-pay

## 2012-09-04 ENCOUNTER — Encounter (HOSPITAL_COMMUNITY): Payer: Self-pay

## 2012-09-04 ENCOUNTER — Encounter (HOSPITAL_COMMUNITY)
Admission: RE | Admit: 2012-09-04 | Discharge: 2012-09-04 | Disposition: A | Discharge status: Home / Self Care | Payer: Medicare Other | Source: Ambulatory Visit | Attending: Otolaryngology | Admitting: Otolaryngology

## 2012-09-04 HISTORY — DX: Unspecified osteoarthritis, unspecified site: M19.90

## 2012-09-04 LAB — CBC
HCT: 51 % (ref 39.0–52.0)
MCH: 32.4 pg (ref 26.0–34.0)
MCHC: 34.9 g/dL (ref 30.0–36.0)
MCV: 92.9 fL (ref 78.0–100.0)
Platelets: 239 10*3/uL (ref 150–400)
RDW: 13.2 % (ref 11.5–15.5)
WBC: 8.2 10*3/uL (ref 4.0–10.5)

## 2012-09-04 LAB — BASIC METABOLIC PANEL
BUN: 28 mg/dL — ABNORMAL HIGH (ref 6–23)
Calcium: 9.4 mg/dL (ref 8.4–10.5)
Creatinine, Ser: 1.16 mg/dL (ref 0.50–1.35)
GFR calc Af Amer: 66 mL/min — ABNORMAL LOW (ref >90)
GFR calc non Af Amer: 57 mL/min — ABNORMAL LOW (ref >90)

## 2012-09-04 LAB — SURGICAL PCR SCREEN: MRSA, PCR: NEGATIVE

## 2012-09-04 NOTE — Patient Instructions (Signed)
20 Fernando [name]  09/04/2012   Your procedure is scheduled on:  09/09/2012  Report to [facility] at 7:00 AM.  Call this number if you have problems the morning of surgery: [phone]   Remember:   Do not drink or eat food:After Midnight.     Take these medicines the morning of surgery with A SIP OF WATER: Cozaar   Do not wear jewelry, make-up or nail polish.  Do not wear lotions, powders, or perfumes. You may wear deodorant.  Do not shave 48 hours prior to surgery. Men may shave face and neck.  Do not bring valuables to the hospital.  Contacts, dentures or bridgework may not be worn into surgery.  Leave suitcase in the car. After surgery it may be brought to your room.  For patients admitted to the hospital, checkout time is 11:00 AM the day of discharge.   Patients discharged the day of surgery will not be allowed to drive home.  Name and phone number of your driver:   Special Instructions: Shower using CHG 2 nights before surgery and the night before surgery.  If you shower the day of surgery use CHG.  Use special wash - you have one bottle of CHG for all showers.  You should use approximately 1/3 of the bottle for each shower. N/A   Please read over the following fact sheets that you were given: Pain Booklet, Surgical Site Infection Prevention, Anesthesia Post-op Instructions and Care and Recovery After Surgery Parotidectomy You have 2 parotid glands. One is on each side of your face, in front of your ears. Parotid glands make spit (saliva). Sometimes, the parotid glands develop infections or growths (tumors) which can block the flow of saliva from the gland.This can cause swelling. Sometimes, tumors can get in the way of the facial nerve that passes through the parotid gland. In some of these cases, parotidectomy is necessary. Parotidectomy is surgery to remove all or part of a parotid gland.  LET YOUR CAREGIVER KNOW ABOUT:  Allergies to food or medicine.  Medicines taken,  including vitamins, herbs, eyedrops, over-the-counter medicines, and creams.  Use of steroids (by mouth or creams).  Previous problems with anesthetics or numbing medicines.  History of bleeding problems or blood clots.  Previous surgery.  Other health problems, including diabetes and kidney problems.  Possibility of pregnancy, if this applies. RISKS AND COMPLICATIONS Usually, problems do not develop after a parotidectomy. However, they can occur. Possibilities include:  Infection.  Bleeding.  Scarring.  Numbness or weakness in the face. If this does develop, it usually gets better in a few months. Permanent numbness or weakness is rare.  Leaking saliva. It can collect in the wound area and leak through the surgical cut (incision). This can happen after the drain has been taken out. It can happen after the stitches are gone, too. It usually clears up on its own.  Frey's syndrome. With this condition, sweat develops on the face when you eat. This can happen if nerves from the parotid gland grow into some of the sweat glands on the face. It may not show up until about 6 months after surgery.  Tumor regrowth. BEFORE THE PROCEDURE  A medical evaluation will be done before the parotidectomy. This may include:  A physical exam. This will include questions about your health.  Blood tests.  Talking with the person in charge of giving you medicine that keeps you from feeling pain during your procedure (anesthesiologist). For a parotidectomy, usually you will  receive medicine that makes you sleep (general anesthetic). Ask the anesthesiologist or your surgeon what to expect.  The day before the surgery, eat only a light dinner. Do not eat or drink anything for at least 8 hours before the surgery. Ask your caregiver if it is okay to take any needed medicines with a sip of water. PROCEDURE How long the surgery will take depends on what is being done. It often lasts 3 to 4 hours if just  part of the gland is being removed. It could take up to 5 hours if the whole gland is taken out.  Your heart rate, blood pressure, and oxygen level will be monitored.  A needle attached to a plastic tube (intravenous [IV] line) will be inserted in your hand or arm. Medicine and fluids will be able to flow directly into your body through the IV line.  You might be given medicine to help you relax (sedative).  You will be given an anesthetic.  The area of your face near the parotid gland will be cleaned with a germ-killing solution.  Once you are asleep, the surgeon will make an incision in front of your ear. Usually, the incision curves under the ear and down to the neck.  Skin and tissue are lifted so the surgeon can see the parotid gland.  The gland, or part of it, is removed. The surgeon will be careful to protect the facial nerve.  The surgeon will close the incision with stitches (sutures).  Sometimes, a drain is placed at the bottom of the incision. It is attached to a small plastic container, shaped like a bulb. This lets extra fluids, like blood or saliva, flow out of the wound and into the bulb. It will be taken out once the wound has healed.  Medicine and a bandage (dressing) may be put over the incision. AFTER THE PROCEDURE You will stay in a recovery area until the anesthesia has worn off. Your blood pressure and pulse will be checked frequently. Then you will be taken to a hospital room. You may continue to get fluids through the IV line for a while. Some pain is normal. You may be given pain medicine in the recovery area. Be sure to tell your caregivers if the pain gets worse. Your caregivers will check movement in your face. They may ask you to smile or move your cheeks The drain and bulb will stay in place for a while, even after you go home. Most people stay in the hospital for about 1 day after surgery. Document Released: 12/14/2010 Document Revised: 02/03/2012 Document  Reviewed: 12/14/2010 [facility] Patient Information 2013 [place], [place].  Instructions Following General Anesthetic, Adult A nurse specialized in giving anesthesia (anesthetist) or a doctor specialized in giving anesthesia (anesthesiologist) gave you a medicine that made you sleep while a procedure was performed. For as long as 24 hours following this procedure, you may feel:  Dizzy.  Weak.  Drowsy. AFTER THE PROCEDURE After surgery, you will be taken to the recovery area where a nurse will monitor your progress. You will be allowed to go home when you are awake, stable, taking fluids well, and without complications. For the first 24 hours following an anesthetic:  Have a responsible person with you.  Do not drive a car. If you are alone, do not take public transportation.  Do not drink alcohol.  Do not take medicine that has not been prescribed by your caregiver.  Do not sign important papers or make important  decisions.  You may resume normal diet and activities as directed.  Change bandages (dressings) as directed.  Only take over-the-counter or prescription medicines for pain, discomfort, or fever as directed by your caregiver. If you have questions or problems that seem related to the anesthetic, call the hospital and ask for the anesthetist or anesthesiologist on call. SEEK IMMEDIATE MEDICAL CARE IF:   You develop a rash.  You have difficulty breathing.  You have chest pain.  You develop any allergic problems. Document Released: 02/17/2001 Document Revised: 02/03/2012 Document Reviewed: 09/28/2007 [facility] Patient Information 2013 [place], [place].

## 2012-09-04 NOTE — Progress Notes (Signed)
09/04/12 1117  OBSTRUCTIVE SLEEP APNEA  Have you ever been diagnosed with sleep apnea through a sleep study? No  Do you snore loudly (loud enough to be heard through closed doors)?  0  Do you often feel tired, fatigued, or sleepy during the daytime? 1  Has anyone observed you stop breathing during your sleep? 0  Do you have, or are you being treated for high blood pressure? 1  BMI more than 35 kg/m2? 0  Age over 76 years old? 1  Neck circumference greater than 40 cm/18 inches? 0  Gender: 1  Obstructive Sleep Apnea Score 4   Score 4 or greater  Results sent to PCP;No PCP

## 2012-09-08 NOTE — OR Nursing (Signed)
Abnormal EKG , reported to Dr. Jayme Cloud, no further orders given.

## 2012-09-09 ENCOUNTER — Observation Stay (HOSPITAL_COMMUNITY)
Admission: RE | Admit: 2012-09-09 | Discharge: 2012-09-10 | Disposition: A | Discharge status: Home / Self Care | Payer: Medicare Other | Source: Ambulatory Visit | Attending: Family Medicine | Admitting: Family Medicine

## 2012-09-09 ENCOUNTER — Ambulatory Visit (HOSPITAL_COMMUNITY): Payer: Medicare Other | Admitting: Anesthesiology

## 2012-09-09 ENCOUNTER — Encounter (HOSPITAL_COMMUNITY): Payer: Self-pay | Admitting: Anesthesiology

## 2012-09-09 ENCOUNTER — Encounter (HOSPITAL_COMMUNITY): Payer: Self-pay | Admitting: Cardiology

## 2012-09-09 ENCOUNTER — Encounter (HOSPITAL_COMMUNITY)
Admission: RE | Disposition: A | Discharge status: Home / Self Care | Payer: Self-pay | Source: Ambulatory Visit | Attending: Family Medicine

## 2012-09-09 ENCOUNTER — Encounter (HOSPITAL_COMMUNITY): Payer: Self-pay | Admitting: *Deleted

## 2012-09-09 DIAGNOSIS — Z01810 Encounter for preprocedural cardiovascular examination: Secondary | ICD-10-CM | POA: Insufficient documentation

## 2012-09-09 DIAGNOSIS — Z01812 Encounter for preprocedural laboratory examination: Secondary | ICD-10-CM | POA: Insufficient documentation

## 2012-09-09 DIAGNOSIS — I10 Essential (primary) hypertension: Secondary | ICD-10-CM

## 2012-09-09 DIAGNOSIS — Z79899 Other long term (current) drug therapy: Secondary | ICD-10-CM | POA: Insufficient documentation

## 2012-09-09 DIAGNOSIS — E785 Hyperlipidemia, unspecified: Secondary | ICD-10-CM | POA: Insufficient documentation

## 2012-09-09 DIAGNOSIS — I4891 Unspecified atrial fibrillation: Secondary | ICD-10-CM

## 2012-09-09 DIAGNOSIS — I6529 Occlusion and stenosis of unspecified carotid artery: Secondary | ICD-10-CM

## 2012-09-09 DIAGNOSIS — R000 Tachycardia, unspecified: Secondary | ICD-10-CM | POA: Insufficient documentation

## 2012-09-09 DIAGNOSIS — K119 Disease of salivary gland, unspecified: Principal | ICD-10-CM | POA: Insufficient documentation

## 2012-09-09 DIAGNOSIS — Z5309 Procedure and treatment not carried out because of other contraindication: Secondary | ICD-10-CM | POA: Insufficient documentation

## 2012-09-09 DIAGNOSIS — I495 Sick sinus syndrome: Secondary | ICD-10-CM

## 2012-09-09 DIAGNOSIS — G4733 Obstructive sleep apnea (adult) (pediatric): Secondary | ICD-10-CM | POA: Insufficient documentation

## 2012-09-09 HISTORY — DX: Disorder of arteries and arterioles, unspecified: I77.9

## 2012-09-09 HISTORY — DX: Unspecified atrial fibrillation: I48.91

## 2012-09-09 HISTORY — DX: Peripheral vascular disease, unspecified: I73.9

## 2012-09-09 HISTORY — DX: Essential (primary) hypertension: I10

## 2012-09-09 LAB — TROPONIN I
Troponin I: <0.3 ng/mL (ref <0.30)
Troponin I: <0.3 ng/mL (ref <0.30)

## 2012-09-09 SURGERY — CANCELLED PROCEDURE
Anesthesia: General

## 2012-09-09 MED ORDER — FENTANYL CITRATE 0.05 MG/ML IJ SOLN: 25.0000 ug | INTRAMUSCULAR | Status: DC | PRN

## 2012-09-09 MED ORDER — ENOXAPARIN SODIUM 40 MG/0.4ML ~~LOC~~ SOLN
40.0000 mg | SUBCUTANEOUS | Status: DC
  Administered 2012-09-09: 40 mg via SUBCUTANEOUS
  Filled 2012-09-09: qty 0.4

## 2012-09-09 MED ORDER — BUPIVACAINE-EPINEPHRINE (PF) 0.5% -1:200000 IJ SOLN
INTRAMUSCULAR | Status: DC | PRN
  Administered 2012-09-09: 5 mL

## 2012-09-09 MED ORDER — METOPROLOL TARTRATE 1 MG/ML IV SOLN
INTRAVENOUS | Status: AC
  Filled 2012-09-09: qty 5

## 2012-09-09 MED ORDER — LIDOCAINE HCL (CARDIAC) 10 MG/ML IV SOLN
INTRAVENOUS | Status: DC | PRN
  Administered 2012-09-09: 50 mg via INTRAVENOUS

## 2012-09-09 MED ORDER — LACTATED RINGERS IV SOLN
INTRAVENOUS | Status: DC | PRN
  Administered 2012-09-09: 08:00:00 via INTRAVENOUS
  Administered 2012-09-09: 1000 mL

## 2012-09-09 MED ORDER — MIDAZOLAM HCL 2 MG/2ML IJ SOLN
1.0000 mg | INTRAMUSCULAR | Status: DC | PRN
  Administered 2012-09-09: 2 mg via INTRAVENOUS

## 2012-09-09 MED ORDER — PROPOFOL 10 MG/ML IV EMUL
INTRAVENOUS | Status: DC | PRN
  Administered 2012-09-09: 100 mg via INTRAVENOUS

## 2012-09-09 MED ORDER — LACTATED RINGERS IV SOLN: INTRAVENOUS | Status: DC

## 2012-09-09 MED ORDER — ATROPINE SULFATE 0.4 MG/ML IJ SOLN
INTRAMUSCULAR | Status: AC
  Filled 2012-09-09: qty 1

## 2012-09-09 MED ORDER — FENTANYL CITRATE 0.05 MG/ML IJ SOLN
INTRAMUSCULAR | Status: DC | PRN
  Administered 2012-09-09 (×2): 50 ug via INTRAVENOUS

## 2012-09-09 MED ORDER — ATROPINE SULFATE 0.4 MG/ML IJ SOLN
INTRAMUSCULAR | Status: DC | PRN
  Administered 2012-09-09 (×2): .5 mg via INTRAVENOUS

## 2012-09-09 MED ORDER — ATORVASTATIN CALCIUM 40 MG PO TABS
40.0000 mg | ORAL_TABLET | Freq: Every day | ORAL | Status: DC
  Administered 2012-09-10: 40 mg via ORAL
  Filled 2012-09-09: qty 1

## 2012-09-09 MED ORDER — LIDOCAINE-EPINEPHRINE (PF) 1 %-1:200000 IJ SOLN
INTRAMUSCULAR | Status: AC
  Filled 2012-09-09: qty 10

## 2012-09-09 MED ORDER — EPHEDRINE SULFATE 50 MG/ML IJ SOLN
INTRAMUSCULAR | Status: DC | PRN
  Administered 2012-09-09: 10 mg via INTRAVENOUS

## 2012-09-09 MED ORDER — PROPOFOL 10 MG/ML IV EMUL
INTRAVENOUS | Status: AC
  Filled 2012-09-09: qty 20

## 2012-09-09 MED ORDER — METOPROLOL TARTRATE 1 MG/ML IV SOLN
INTRAVENOUS | Status: DC | PRN
  Administered 2012-09-09: 1 mg via INTRAVENOUS

## 2012-09-09 MED ORDER — CEFAZOLIN SODIUM-DEXTROSE 2-3 GM-% IV SOLR: 2.0000 g | Freq: Once | INTRAVENOUS | Status: DC

## 2012-09-09 MED ORDER — FENTANYL CITRATE 0.05 MG/ML IJ SOLN
INTRAMUSCULAR | Status: AC
  Filled 2012-09-09: qty 2

## 2012-09-09 MED ORDER — MIDAZOLAM HCL 2 MG/2ML IJ SOLN
INTRAMUSCULAR | Status: AC
  Filled 2012-09-09: qty 2

## 2012-09-09 MED ORDER — LIDOCAINE HCL (PF) 1 % IJ SOLN
INTRAMUSCULAR | Status: AC
  Filled 2012-09-09: qty 5

## 2012-09-09 MED ORDER — 0.9 % SODIUM CHLORIDE (POUR BTL) OPTIME
TOPICAL | Status: DC | PRN
  Administered 2012-09-09: 1000 mL

## 2012-09-09 MED ORDER — CEFAZOLIN SODIUM-DEXTROSE 2-3 GM-% IV SOLR
INTRAVENOUS | Status: DC | PRN
  Administered 2012-09-09: 2 g via INTRAVENOUS

## 2012-09-09 MED ORDER — CEFAZOLIN SODIUM-DEXTROSE 2-3 GM-% IV SOLR
INTRAVENOUS | Status: AC
  Filled 2012-09-09: qty 50

## 2012-09-09 MED ORDER — ROCURONIUM BROMIDE 50 MG/5ML IV SOLN
INTRAVENOUS | Status: AC
  Filled 2012-09-09: qty 1

## 2012-09-09 MED ORDER — LOSARTAN POTASSIUM 50 MG PO TABS
50.0000 mg | ORAL_TABLET | Freq: Every day | ORAL | Status: DC
  Administered 2012-09-10: 50 mg via ORAL
  Filled 2012-09-09: qty 1

## 2012-09-09 MED ORDER — ONDANSETRON HCL 4 MG/2ML IJ SOLN: 4.0000 mg | Freq: Once | INTRAMUSCULAR | Status: DC | PRN

## 2012-09-09 MED ORDER — SUCCINYLCHOLINE CHLORIDE 20 MG/ML IJ SOLN
INTRAMUSCULAR | Status: DC | PRN
  Administered 2012-09-09: 100 mg via INTRAVENOUS

## 2012-09-09 MED ORDER — SUCCINYLCHOLINE CHLORIDE 20 MG/ML IJ SOLN
INTRAMUSCULAR | Status: AC
  Filled 2012-09-09: qty 1

## 2012-09-09 SURGICAL SUPPLY — 52 items
ATTRACTOMAT 16X20 MAGNETIC DRP (DRAPES) ×3 IMPLANT
BAG HAMPER (MISCELLANEOUS) ×3 IMPLANT
BALL CTTN STRL GZE (GAUZE/BANDAGES/DRESSINGS) ×2
BLADE SURG 15 STRL LF DISP TIS (BLADE) ×2 IMPLANT
BLADE SURG 15 STRL SS (BLADE) ×3
CLOTH BEACON ORANGE TIMEOUT ST (SAFETY) ×3 IMPLANT
COTTON BALL STERILE (GAUZE/BANDAGES/DRESSINGS) ×3
COTTON BALL STERILE 2 PK (GAUZE/BANDAGES/DRESSINGS) ×1 IMPLANT
COVER LIGHT HANDLE STERIS (MISCELLANEOUS) ×6 IMPLANT
DECANTER SPIKE VIAL GLASS SM (MISCELLANEOUS) ×3 IMPLANT
ELECT COATED BLADE 2.86 ST (ELECTRODE) ×1 IMPLANT
ELECT PAIRED SUBDERMAL (MISCELLANEOUS) ×3
ELECT REM PT RETURN 9FT ADLT (ELECTROSURGICAL) ×3
ELECTRODE PAIRED SUBDERMAL (MISCELLANEOUS) ×1 IMPLANT
ELECTRODE REM PT RTRN 9FT ADLT (ELECTROSURGICAL) ×2 IMPLANT
FORMALIN 10 PREFIL 120ML (MISCELLANEOUS) ×1 IMPLANT
FORMALIN 10 PREFIL 480ML (MISCELLANEOUS) IMPLANT
GAUZE SPONGE 4X4 16PLY XRAY LF (GAUZE/BANDAGES/DRESSINGS) ×3 IMPLANT
GLOVE BIOGEL PI IND STRL 7.5 (GLOVE) ×1 IMPLANT
GLOVE BIOGEL PI IND STRL 8 (GLOVE) ×1 IMPLANT
GLOVE BIOGEL PI INDICATOR 7.5 (GLOVE) ×1
GLOVE BIOGEL PI INDICATOR 8 (GLOVE)
GLOVE ECLIPSE 7.0 STRL STRAW (GLOVE) ×2 IMPLANT
GLOVE ECLIPSE 7.5 STRL STRAW (GLOVE) ×3 IMPLANT
GLOVE SKINSENSE NS SZ7.0 (GLOVE) ×1
GLOVE SKINSENSE STRL SZ7.0 (GLOVE) ×1 IMPLANT
GOWN STRL REIN XL XLG (GOWN DISPOSABLE) ×9 IMPLANT
KIT ROOM TURNOVER AP CYSTO (KITS) ×3 IMPLANT
NDL HYPO 25X1 1.5 SAFETY (NEEDLE) ×1 IMPLANT
NEEDLE HYPO 25X1 1.5 SAFETY (NEEDLE) ×3 IMPLANT
NS IRRIG 1000ML POUR BTL (IV SOLUTION) ×3 IMPLANT
PAD ARMBOARD 7.5X6 YLW CONV (MISCELLANEOUS) ×3 IMPLANT
PENCIL HANDSWITCHING (ELECTRODE) ×5 IMPLANT
SET BASIN LINEN APH (SET/KITS/TRAYS/PACK) ×3 IMPLANT
SHEARS HARMONIC 9CM CVD (BLADE) ×1 IMPLANT
SPONGE INTESTINAL PEANUT (DISPOSABLE) ×3 IMPLANT
STAPLER VISISTAT 35W (STAPLE) ×1 IMPLANT
SUT CHROMIC 4 0 PS 2 18 (SUTURE) ×1 IMPLANT
SUT ETHILON 3 0 PS 1 (SUTURE) ×1 IMPLANT
SUT ETHILON 5 0 P 3 18 (SUTURE)
SUT NYLON ETHILON 5-0 P-3 1X18 (SUTURE) ×1 IMPLANT
SUT PROLENE 5 0 PS 3 (SUTURE) ×2 IMPLANT
SUT SILK 2 0 (SUTURE) ×3
SUT SILK 2-0 18XBRD TIE 12 (SUTURE) ×2 IMPLANT
SUT SILK 4 0 (SUTURE)
SUT SILK 4-0 18XBRD TIE 12 (SUTURE) ×1 IMPLANT
SUT VIC AB 4-0 PS2 27 (SUTURE) ×2 IMPLANT
SYR CONTROL 10ML LL (SYRINGE) ×3 IMPLANT
SYSTEM CHEST DRAIN TLS 7FR (DRAIN) ×1 IMPLANT
TRAY ENT MC OR (CUSTOM PROCEDURE TRAY) ×3 IMPLANT
TRAY FOLEY CATH 14FR (SET/KITS/TRAYS/PACK) ×1 IMPLANT
WATER STERILE IRR 1000ML POUR (IV SOLUTION) ×3 IMPLANT

## 2012-09-09 NOTE — Preoperative (Signed)
Beta Blockers   Reason not to administer Beta Blockers:Not Applicable 

## 2012-09-09 NOTE — Consult Note (Signed)
.   Primary care physician: Dr. Butch Penny  Clinical Summary Mr. Hyder is a 78 y.o.male with past medical history outlined below, now admitted to the hospital for observation after having right superficial parotidectomy canceled. Notes indicate that the patient had hemodynamic instability post induction with anesthesia associated with reportedly rapid and also slow atrial fibrillation. No telemetry strips are available for evaluation. The patient's baseline preoperative ECG shows atrial fibrillation, and he indicates a long-standing history of "irregular heartbeats."  No definite history of obstructive CAD or myocardial infarction is found based on chart review. It is not clear that he has had any previous cardiac evaluation, although does have vascular disease and is status post left carotid endarterectomy.  He reports no reproducible exertional chest pain, no regular sense of palpitations, no sudden episodes of dizziness or syncope. He is not on any rate control medications for his atrial fibrillation at home, has been on aspirin but not anticoagulation.  On interview in the ICU, his systolic blood pressure was 110, heart rate in the 80s in atrial fibrillation, comfortable without chest pain or breathlessness.  His baseline ECG from 10/11 showed atrial fibrillation with nonspecific T-wave changes and poor R wave progression.   No Known Allergies  Medications    . atorvastatin  40 mg Oral Daily  . enoxaparin (LOVENOX) injection  40 mg Subcutaneous Q24H  . losartan  50 mg Oral Daily  . DISCONTD:  ceFAZolin (ANCEF) IV  2 g Intravenous Once    Past Medical History  Diagnosis Date  . Carotid artery disease     Dr. Hart Rochester - s/p left CEA  . Hyperlipidemia   . Essential hypertension, benign   . Arthritis   . Sleep apnea     STOP BANG score =4  . Atrial fibrillation     Past Surgical History  Procedure Date  . Carotid endarterectomy   . Cataract extraction w/ intraocular lens  implant     Bilateral    Family History  Problem Relation Age of Onset  . Heart disease Sister     Social History Mr. Pavlak reports that he quit smoking about 39 years ago. His smoking use included Cigarettes. He does not have any smokeless tobacco history on file. Mr. Helmes reports that he drinks about .6 ounces of alcohol per week.  Review of Systems Hard of hearing. No reported bleeding diathesis. No sudden syncope. Occasionally feels dizzy if he shifts positions quickly or stands up quickly. No orthopnea or PND. No regular exertional chest pains.  Physical Examination Blood pressure 97/78, pulse 85, temperature 98.7 F (37.1 C), temperature source Oral, resp. rate 27, height 5\' 5"  (1.651 m), weight 182 lb 15.7 oz (83 kg), SpO2 96.00%.  Patient in no acute distress. HEENT: Conjunctiva and lids normal, oropharynx clear. Neck: Supple, no elevated JVP, soft right arotid bruit, left CEA scar, no thyromegaly. Lungs: Clear to auscultation, diminished, nonlabored breathing at rest. Cardiac: Irregularly irregular, no S3, soft systolic murmur, no pericardial rub. Abdomen: Soft, nontender, bowel sounds present, no guarding or rebound. Extremities: No pitting edema, distal pulses 1-2+. Skin: Warm and dry. Musculoskeletal: No kyphosis. Neuropsychiatric: Alert and oriented x3, affect grossly appropriate. Decreased hearing.   Lab Results Basic Metabolic Panel:  Lab 09/04/12 1610  NA 139  K 4.7  CL 102  CO2 26  GLUCOSE 97  BUN 28*  CREATININE 1.16  CALCIUM 9.4  MG --  PHOS --   CBC:  Lab 09/04/12 1130  WBC 8.2  NEUTROABS --  HGB 17.8*  HCT 51.0  MCV 92.9  PLT 239    Impression  1. Reported episode of hemodynamic instability including rapid and slow atrial fibrillation following anesthesia induction, parotidectomy was canceled. Rhythm strips are not available to review. Etiology not entirely certain, question autonomic instability with anesthesia, rule out primary  rhythm evident with tachycardia-bradycardia syndrome.  2. Atrial fibrillation, presumably chronic based on limited information. Preoperative ECG showed atrial fibrillation and the patient states that he has had "irregular heartbeat" for years. He has been on aspirin, but no anticoagulation. He does not indicate any sudden dizziness or syncope to suspect recurrent episodes of bradycardia or pauses. He is however on no rate control medications at baseline.  3. Carotid artery disease status post left CEA, residual 60-79% RICA disease being followed at this time by Dr. Hart Rochester.  4. History of sleep apnea, could also be a precipitant for intermittent bradycardia.  5. Hypertension, blood pressure now stable.   Recommendations  Continue telemetry monitoring for now, followup ECG in the morning. An echocardiogram has also been ordered for assessment of LV function. Obtain cardiac markers, although doubt acute coronary syndrome at this time. Question will be whether he needs any further testing from ischemic perspective in light of his known vascular disease prior to proceeding with anesthesia attempt again. No medications are being started this time as it relates to heart rate control until more information is available from monitoring. Dr. Renard Matter may also be able to shed more light on duration of the patient's atrial fibrillation, and candidacy for anticoagulation over time.  Jonelle Sidle, M.D., F.A.C.C.

## 2012-09-09 NOTE — Anesthesia Postprocedure Evaluation (Addendum)
  Anesthesia Post-op Note  Patient: Fernando [name]  Procedure(s) Performed: Procedure(s) (LRB) with comments: PAROTIDECTOMY (Right) - Right Superficial Parotidectomy  Patient Location: PACU  Anesthesia Type: General  Level of Consciousness: awake, alert , oriented and patient cooperative  Airway and Oxygen Therapy: Patient Spontanous Breathing and Patient connected to face mask oxygen  Post-op Pain:None  Post-op Assessment: Post-op Vital signs reviewed, Patient's Cardiovascular Status Stable, Respiratory Function Stable, Patent Airway and No signs of Nausea or vomiting  Post-op Vital Signs: Reviewed and stable  Complications: Hemodynamically unstable post-induction, resolved upon termination of anesthesia.  Patient now stable in PACU without complaints of chest pain, SOB.  Patient will be transferred to ICU under care of hospitalist for further cardiac workup.  09/10/12  Patient transferred to regular telemetry bed and awaiting cardiology consult.  VSS.

## 2012-09-09 NOTE — Anesthesia Preprocedure Evaluation (Addendum)
Anesthesia Evaluation  Patient identified by MRN, date of birth, ID band Patient awake    Reviewed: Allergy & Precautions, H&P , NPO status , Patient's Chart, lab work & pertinent test results  History of Anesthesia Complications Negative for: history of anesthetic complications  Airway Mallampati: II  Neck ROM: Full    Dental  (+) Edentulous Upper and Edentulous Lower   Pulmonary sleep apnea , former smoker,  breath sounds clear to auscultation        Cardiovascular hypertension, Pt. on medications + dysrhythmias (no meds but rate controllled) Atrial Fibrillation Rhythm:Irregular Rate:Normal     Neuro/Psych    GI/Hepatic negative GI ROS,   Endo/Other    Renal/GU      Musculoskeletal   Abdominal   Peds  Hematology   Anesthesia Other Findings   Reproductive/Obstetrics                           Anesthesia Physical Anesthesia Plan  ASA: III  Anesthesia Plan: General   Post-op Pain Management:    Induction: Intravenous  Airway Management Planned: Oral ETT  Additional Equipment:   Intra-op Plan:   Post-operative Plan: Extubation in OR  Informed Consent: I have reviewed the patients History and Physical, chart, labs and discussed the procedure including the risks, benefits and alternatives for the proposed anesthesia with the patient or authorized representative who has indicated his/her understanding and acceptance.     Plan Discussed with:   Anesthesia Plan Comments:         Anesthesia Quick Evaluation

## 2012-09-09 NOTE — Progress Notes (Signed)
Report called and given to [facility], Charity fundraiser. Patient alert, oriented and in stable condition at the time of transport. Patient being transported to tele to room 301.

## 2012-09-09 NOTE — H&P (Signed)
  H&P Update  Pt's original H&P dated 08/20/12 reviewed and placed in chart (to be scanned).  I personally examined the patient today.  No change in health. Proceed with right parotidectomy with facial nerve dissection.

## 2012-09-09 NOTE — H&P (Signed)
Fernando [name], Fernando [name]              ACCOUNT NO.:  [id number]  MEDICAL RECORD NO.:  [id number]  LOCATION:  A301                          FACILITY:  [facility]  PHYSICIAN:  Remas Sobel G. Renard Matter, MD   DATE OF BIRTH:  [birth year]  DATE OF ADMISSION:  09/09/2012 DATE OF DISCHARGE:  LH                             HISTORY & PHYSICAL   This 78 year old male was in surgery today for excision of superficial parathyroid tumor.  He was intubated, developed cardiac arrhythmia during the process, atrial fibrillation and apparently he brady'd down to rate in the 30s.  He was extubated and the surgery was canceled.  The patient was sent down to ICU for monitoring.  Apparent during his episode, he did not have any chest pain or dyspnea.  EKG showed evidence of atrial fibrillation with nonspecific ST and T-wave changes.  SOCIAL HISTORY:  The patient is a former cigarette smoker.  Does drink alcohol in moderation.  FAMILY HISTORY:  Noncontributory.  ALLERGIES:  The patient has no known allergies.  PAST MEDICAL HISTORY:  The patient does have a history of atrial fibrillation, but has not been anticoagulated.  This apparently has been intermittent.  He does have carotid artery disease and left CEA by Dr. Hart Rochester in [place].  He does have hyperlipidemia, hypertension, arthritis, and sleep apnea.  PAST SURGICAL HISTORY:  The patient has had carotid endarterectomy and bilateral cataract extraction.  REVIEW OF SYSTEMS:  HEENT:  Negative with the exception of decreased hearing.  CARDIOPULMONARY:  The patient has had no chest pain or dyspnea.  GI:  No bowel irregularity or bleeding.  GU:  No dysuria or hematuria.  MEDICATION LIST: 1. Atorvastatin 40 mg daily. 2. Losartan 50 mg daily.  PHYSICAL EXAMINATION:  GENERAL:  At the time of this examination, the patient was alert and oriented. VITAL SIGNS:  Blood pressure 116/82, respirations 20, pulse 78, temp 98.3. HEENT:  Eyes, PERRLA.  TMs negative.   Oropharynx benign.  The patient does have a palpable tumor approximately 2 cm in diameter in the area of the right parotid gland. NECK:  Supple.  No JVD or thyroid abnormalities. LUNGS:  Clear to P and A. HEART:  Irregular rhythm.  No murmurs. ABDOMEN:  No palpable organs or masses.  No organomegaly. EXTREMITIES:  Free of edema. NEUROLOGIC:  The patient is alert and oriented.  No gross sensory or motor abnormalities.  ASSESSMENT: 1. 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 2. He does have atrial fibrillation and history of essential     hypertension. 3. Sleep apnea and previous carotid artery disease with previous     treatment.  PLAN:  To continue to monitor the patient in intensive care.  We will obtain Cardiology consult and determine whether or not the patient will need anticoagulation.     [name], MD     AGM/MEDQ  D:  09/09/2012  T:  09/09/2012  Job:  [id number]

## 2012-09-09 NOTE — Transfer of Care (Addendum)
  Anesthesia Post-op Note  Patient: Fernando [name]  Procedure(s) Performed: Procedure(s) (LRB) with comments: PAROTIDECTOMY (Right) - Right Superficial Parotidectomy  Patient Location: PACU  Anesthesia Type: General  Level of Consciousness: awake, alert , oriented and patient cooperative  Airway and Oxygen Therapy: Patient Spontanous Breathing and Patient connected to face mask oxygen  Post-op Pain: mild  Post-op Assessment: Post-op Vital signs reviewed, , Respiratory Function Stable, Patent Airway and No signs of Nausea or vomiting  Post-op Vital Signs: Reviewed and stable  Complications:

## 2012-09-09 NOTE — Anesthesia Procedure Notes (Signed)
Procedure Name: Intubation Date/Time: 09/09/2012 8:47 AM Performed by: Carolyne Littles, Jasani Dolney L Pre-anesthesia Checklist: Patient identified, Patient being monitored, Timeout performed, Emergency Drugs available and Suction available Patient Re-evaluated:Patient Re-evaluated prior to inductionOxygen Delivery Method: Circle System Utilized Preoxygenation: Pre-oxygenation with 100% oxygen Intubation Type: IV induction Ventilation: Mask ventilation without difficulty Laryngoscope Size: 3 and Miller Grade View: Grade I Tube type: Oral Tube size: 7.0 mm Number of attempts: 1 Airway Equipment and Method: stylet Placement Confirmation: ETT inserted through vocal cords under direct vision,  positive ETCO2 and breath sounds checked- equal and bilateral Secured at: 21 cm Tube secured with: Tape Dental Injury: Teeth and Oropharynx as per pre-operative assessment

## 2012-09-09 NOTE — Progress Notes (Signed)
Pt's surgery was aborted due to unstable BP and heart rate.  Pt was noted to be in afib with wildly irregular heart rate, from 20s to 160s.  Plan to admit for observation and cardiac eval.    Fernando [name] Philomena Doheny, MD

## 2012-09-09 NOTE — Progress Notes (Signed)
*  PRELIMINARY RESULTS* Echocardiogram 2D Echocardiogram has been performed.  Conrad London 09/09/2012, 12:41 PM

## 2012-09-10 NOTE — Discharge Summary (Signed)
NAMESONNY, ANTHES              ACCOUNT NO.:  [id number]  MEDICAL RECORD NO.:  [id number]  LOCATION:  A301                          FACILITY:  [facility]  PHYSICIAN:  Lindalee Huizinga G. Renard Matter, MD   DATE OF BIRTH:  [birth year]  DATE OF ADMISSION:  09/09/2012 DATE OF DISCHARGE:  10/17/2013LH                              DISCHARGE SUMMARY   An 79 year old white male admitted September 09, 2012, and discharged September 10, 2012, 1 day hospitalization.  DIAGNOSES: 1. Tumor of right parotid gland. 2. Atrial fibrillation with rapid and slow atrial fibrillation and     bradycardia. 3. Sleep apnea. 4. Vascular disease with previous carotid endarterectomy.  The patient     was scheduled for superficial carotid endarterectomy canceled.  This 78 year old male was scheduled for excision of superficial parotid gland tumor to be done by Dr. Suszanne Conners of ENT. The patient apparently was intubated, developed cardiac arrhythmias during the process with rapid atrial fibrillation and bradycardia with rates in the 30s.  He was extubated, his surgery was canceled, and the patient was sent down to ICU for monitoring.  Apparently during this episode, he did not have any chest pain or dyspnea.  EKG showed evidence of atrial fibrillation with nonspecific ST and T-wave changes.  PHYSICAL EXAMINATION:  GENERAL:  The patient remained alert and oriented. VITAL SIGNS:  Blood pressure 116/82, pulse 78, temp 98.3, respirations 18. HEENT:  Eyes, PERRLA.  TMs negative.  Oropharynx benign.  The patient has palpable tumor in the area of right parotid gland approximate 2 cm in diameter. NECK:  Supple.  No JVD or thyroid abnormalities. LUNGS:  Clear to P and A. HEART:  Irregular rhythm.  No murmurs. ABDOMEN:  No palpable organs or masses.  No organomegaly. EXTREMITIES:  Free of edema. NEUROLOGIC:  The patient is alert and oriented.  No gross sensory or motor abnormalities.  Chest x-ray on admission, stable cardiomegaly, no active  lung disease. X-ray showed moderate calcifications in the carotid artery, 15 x 17 x 17 mm superficial lobe right parotid mass with central necrosis, possible adenoma.  LABORATORY DATA:  CBC on admission; WBC 8200 with hemoglobin 17.8, hematocrit 51.0.  Chemistries, sodium 139, potassium 4.7, chloride 102, CO2 26, BUN 28, creatinine 1.16, calcium 9.4, GFR 57.  Glucose 97.  HOSPITAL COURSE:  The patient initially was seen in surgery and department of anesthesia.  He apparently was in the process of being intubated when his heart rate increased and then decreased to 20-30 range.  He was extubated and was known to be in atrial fibrillation and then was transferred down to intensive care unit where he was monitored. After extubation, the patient's heart rate although irregular was not any excessively high range.  He remained on intravenous fluids and heart healthy diet and was continued on atorvastatin 40 mg daily, losartan 50 mg daily, subcutaneous Lovenox was given for DVT prophylaxis 40 mg.  The patient was seen in consultation by Cardiology, Dr. Diona Browner initially and then Dr. Daleen Squibb.  It was felt that he had episode of atrial fibrillation and bradycardia.  He had not been on anticoagulation except for aspirin.  A 2D echo was ordered which showed normal left ventricular  systolic function with ejection fraction of 55%.  EKG showed nonspecific T-wave abnormalities.  No acute process.  It was felt that he did not have acute coronary syndrome.  It was felt also that he should be discharged, but he is to check with Cardiology and will be placed on Holter monitor for a period of time.  This was advised by Dr. Daleen Squibb.     Krystin Keeven G. Renard Matter, MD     AGM/MEDQ  D:  09/10/2012  T:  09/10/2012  Job:  [id number]

## 2012-09-10 NOTE — Progress Notes (Signed)
Subjective: Today the patient is relatively asymptomatic. He remains in atrial fibrillation with a more reasonable rate. His surgery was canceled yesterday because of development of bradycardia with rates in the teens and then rapid A. fib. He was to have a removal of a tumor of his parotid gland.   Objective: Vital signs in last 24 hours: Temp:  [97.5 F (36.4 C)-98.7 F (37.1 C)] 98.5 F (36.9 C) (10/17 0617) Pulse Rate:  [77-98] 77  (10/17 0617) Resp:  [12-28] 20  (10/17 0617) BP: (85-153)/(2-104) 135/99 mmHg (10/17 0617) SpO2:  [92 %-100 %] 94 % (10/17 0617) Weight:  [80 kg (176 lb 5.9 oz)-83 kg (182 lb 15.7 oz)] 80 kg (176 lb 5.9 oz) (10/17 0617) Weight change:  Last BM Date: 09/09/12  Intake/Output from previous day: 10/16 0701 - 10/17 0700 In: 600 [I.V.:600] Out: 225 [Urine:225] Intake/Output this shift: Total I/O In: -  Out: 225 [Urine:225]  Physical Exam: Vital signs BP 135/99 pulse 77 respiration 18  HEENT patient has palpable tumor approximate 2 cm in diameter in the area of the right parotid gland  Lungs clear to P&A  Heart irregular rhythm  Abdomen the palpable organs or masses  No results found for this basename: WBC:2,HGB:2,HCT:2,PLT:2 in the last 72 hours BMET No results found for this basename: NA:2,K:2,CL:2,CO2:2,GLUCOSE:2,BUN:2,CREATININE:2,CALCIUM:2 in the last 72 hours  Studies/Results: No results found.  Medications:     . atorvastatin  40 mg Oral Daily  . enoxaparin (LOVENOX) injection  40 mg Subcutaneous Q24H  . losartan  50 mg Oral Daily  . DISCONTD:  ceFAZolin (ANCEF) IV  2 g Intravenous Once       . DISCONTD: lactated ringers      The patient has a palpable tumor in his parotid gland  He does have atrial fibrillation which is been chronic  He did have bradycardia with rates in the teens  Plan Will discuss this will with the cardiologist who is on call today Assessment/Plan:    LOS: 1 day   Noelly Lasseigne G 09/10/2012, 6:46  AM

## 2012-09-10 NOTE — Discharge Summary (Signed)
Fernando, [name]              ACCOUNT NO.:  [id number]  MEDICAL RECORD NO.:  [id number]  LOCATION:  A301                          FACILITY:  [facility]  PHYSICIAN:  Nero Sawatzky G. Renard Matter, MD   DATE OF BIRTH:  1927-10-25  DATE OF ADMISSION:  09/09/2012 DATE OF DISCHARGE:  10/17/2013LH                              DISCHARGE SUMMARY   ADDENDUM  The patient will be continued on the following medications: 1. Atorvastatin 40 mg daily. 2. Losartan 50 mg daily. 3. Aspirin 325 mg daily.  The patient was stable at the time of his discharge.     Skylyn Slezak G. Renard Matter, MD     AGM/MEDQ  D:  09/10/2012  T:  09/10/2012  Job:  [id number]

## 2012-09-10 NOTE — Addendum Note (Signed)
Addendum  created 09/10/12 0802 by Moshe Salisbury, CRNA   Modules edited:Notes Section

## 2012-09-10 NOTE — Progress Notes (Signed)
Patient ID: Yacoub Diltz, male   DOB: September 28, 1927, 78 y.o.   MRN: [id number]   Patient Name: Fernando [name] Date of Encounter: 09/10/2012    SUBJECTIVE  Mr. Alpern is without complaint today. Heart rate has been stable with no further pulses or bradycardia.  Troponins are negative. Echocardiogram shows normal left ventricular systolic function with an ejection fraction of 55%, left atrial enlargement and no pulmonary hypertension. There is no significant valve disease.    CURRENT MEDS    . atorvastatin  40 mg Oral Daily  . enoxaparin (LOVENOX) injection  40 mg Subcutaneous Q24H  . losartan  50 mg Oral Daily  . DISCONTD:  ceFAZolin (ANCEF) IV  2 g Intravenous Once    OBJECTIVE  Filed Vitals:   09/09/12 1100 09/09/12 1603 09/09/12 2105 09/10/12 0617  BP: 97/78 116/82 129/72 135/99  Pulse: 85 78 86 77  Temp:  98.3 F (36.8 C) 98.6 F (37 C) 98.5 F (36.9 C)  TempSrc:  Oral Oral   Resp: 27 20 20 20   Height:  5\' 5"  (1.651 m)    Weight:  177 lb 11.1 oz (80.6 kg)  176 lb 5.9 oz (80 kg)  SpO2: 96% 95% 98% 94%    Intake/Output Summary (Last 24 hours) at 09/10/12 1306 Last data filed at 09/10/12 0800  Gross per 24 hour  Intake     60 ml  Output    225 ml  Net   -165 ml   Filed Weights   09/09/12 1058 09/09/12 1603 09/10/12 0617  Weight: 182 lb 15.7 oz (83 kg) 177 lb 11.1 oz (80.6 kg) 176 lb 5.9 oz (80 kg)    PHYSICAL EXAM  General: Pleasant, NAD. All Neuro: Alert and oriented X 3. Moves all extremities spontaneously. Psych: Normal affect. HEENT:  Normal  Neck: Supple without bruits or JVD. Lungs:  Resp regular and unlabored, CTA. Heart: IRRR, no s3, s4, or murmurs. Abdomen: Soft, non-tender, non-distended, BS + x 4.  Extremities: No clubbing, cyanosis or edema. DP/PT/Radials 2+ and equal bilaterally.  Accessory Clinical Findings  CBC No results found for this basename: WBC:2,NEUTROABS:2,HGB:2,HCT:2,MCV:2,PLT:2 in the last 72 hours Basic Metabolic Panel No  results found for this basename: NA:2,K:2,CL:2,CO2:2,GLUCOSE:2,BUN:2,CREATININE:2,CALCIUM:2,MG:2,PHOS:2 in the last 72 hours Liver Function Tests No results found for this basename: AST:2,ALT:2,ALKPHOS:2,BILITOT:2,PROT:2,ALBUMIN:2 in the last 72 hours No results found for this basename: LIPASE:2,AMYLASE:2 in the last 72 hours Cardiac Enzymes  Basename 09/10/12 0048 09/09/12 1810 09/09/12 1301  CKTOTAL -- -- --  CKMB -- -- --  CKMBINDEX -- -- --  TROPONINI <0.30 <0.30 <0.30   BNP No components found with this basename: POCBNP:3 D-Dimer No results found for this basename: DDIMER:2 in the last 72 hours Hemoglobin A1C No results found for this basename: HGBA1C in the last 72 hours Fasting Lipid Panel No results found for this basename: CHOL,HDL,LDLCALC,TRIG,CHOLHDL,LDLDIRECT in the last 72 hours Thyroid Function Tests No results found for this basename: TSH,T4TOTAL,FREET3,T3FREE,THYROIDAB in the last 72 hours  TELE Chronic A. fib, rate in the 70s  ECG Chronic A. fib, narrow complex, no significant ST segment changes  Radiology/Studies  Ct Soft Tissue Neck W Contrast  08/17/2012  *RADIOLOGY REPORT*  Clinical Data: Knot on right side of face for 3 months.  Increasing in size.  CT NECK WITH CONTRAST  Technique:  Multidetector CT imaging of the neck was performed with intravenous contrast.  Contrast: 1 OMNIPAQUE IOHEXOL 300 MG/ML  SOLN, 75mL OMNIPAQUE IOHEXOL 300 MG/ML  SOLN  Comparison: None.  Findings:  There is a 15 x 17 x 17 mm enhancing, centrally necrotic lesion of the superficial lobe of the right parotid gland.  There is no invasion of the deep lobe.  This can subcutaneous fat are preserved over the lesion.  There are no similar lesions elsewhere in the gland on the right or on the left side.  There is no adenopathy.  Moderate calcific and noncalcified atheromatous changes are seen in both carotid bifurcations with potentially flow reducing RICA stenosis on the right estimated to be  75-90% or possibly greater ( image 53, arrow). Nonstenotic atheromatous change can be seen in the left carotid bifurcation extending into the left internal carotid.  No visible pathologic adenopathy.  Hypoattenuating 12 x 18 x 17 mm left thyroid lesion not clearly cystic should be further investigated with ultrasound with possible tissue sampling as appropriate.  Moderate atheromatous change transverse arch.  No pneumothorax or significant apical lung nodule.  Multilevel cervical spondylosis.  IMPRESSION: 15 x 17 x 17 mm solitary superficial lobe right parotid gland enhancing mass with central necrosis. 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  Tissue sampling is warranted.  12 x 18 x 17 mm left thyroid hypoattenuating lesion, not clearly cystic.  Suspected flow limiting stenosis right internal carotid artery. Consider further characterization with carotid Doppler examination.   Original Report Authenticated By: Elsie Stain, M.D.     ASSESSMENT AND PLAN  Active Problems:  Atrial fibrillation  Essential hypertension, benign  Tachy-brady syndrome   I had a  long talk with him and his daughter. Etiology of his labile heart rate not clear per Dr. Ival Bible note. This was most likely not ischemic and he has good left ventricular function by echocardiography.  After a discussion, his daughter and I feel the best course of action is to discharge today. Note that he was not on any rate slowing drugs on admission. We'll obtain a heart monitor which I'll have him wear for several weeks. We'll then followup with him in the office. If he has significant variability with significant bradycardia we'll proceed with outpatient permanent pacemaker implantation. This would need to proceed before his parotid tumor surgery. If he does not have significant  bradycardia, elective surgery can be scheduled without pacemaker. I will call Dr. Renard Matter to discharge home.  Signed, Valera Castle MD

## 2012-09-10 NOTE — Progress Notes (Signed)
UR Chart Review Completed  

## 2012-09-10 NOTE — Care Management Note (Signed)
    Page 1 of 1   09/10/2012     2:23:06 PM   CARE MANAGEMENT NOTE 09/10/2012  Patient:  [facility]   Account Number:  [id number]  Date Initiated:  09/10/2012  Documentation initiated by:  Sharrie Rothman  Subjective/Objective Assessment:   Pt admitted from home with bradycardia/a fib. Pt lives with his son and will return home at discharge. Pt is independent with ADL's.     Action/Plan:   No CM or HH needs noted.   Anticipated DC Date:  09/10/2012   Anticipated DC Plan:  HOME/SELF CARE         Choice offered to / List presented to:             Status of service:  Completed, signed off Medicare Important Message given?  YES (If response is "NO", the following Medicare IM given date fields will be blank) Date Medicare IM given:  09/10/2012 Date Additional Medicare IM given:    Discharge Disposition:  HOME/SELF CARE  Per UR Regulation:    If discussed at Long Length of Stay Meetings, dates discussed:    Comments:  09/10/12 1422 Arlyss Queen, RN BSN CM

## 2012-09-14 ENCOUNTER — Other Ambulatory Visit: Payer: Self-pay | Admitting: *Deleted

## 2012-09-14 DIAGNOSIS — I4891 Unspecified atrial fibrillation: Secondary | ICD-10-CM

## 2012-09-19 ENCOUNTER — Telehealth: Payer: Self-pay | Admitting: Cardiology

## 2012-09-19 NOTE — Telephone Encounter (Signed)
[facility] reported an increased ventricular rate in atrial fibrillation reaching 180 bpm. Prior records reviewed revealing chronic atrial fibrillation with possible episodes of bradycardia during a recent hospitalization. Monitoring is being performed to determine whether pacing is necessary. We will need to review the complete report from his period of event recording to determine whether any rate control medication can be given. A return office visit will likely be necessary next week. Message is being sent to Dr. Daleen Squibb for his attention and to Ms. Cheek.

## 2012-09-21 ENCOUNTER — Encounter: Payer: Self-pay | Admitting: Cardiology

## 2012-09-21 NOTE — Telephone Encounter (Signed)
Please see instruction below

## 2012-09-21 NOTE — Telephone Encounter (Signed)
Attempted to call patient x 2 today, without a response, to check on symptoms.  Message left to returncall.

## 2012-09-22 NOTE — Telephone Encounter (Signed)
Patient has been made an appointment to see Dr Cid[facility] 10/30 to evaluate need for EP evaluation.  Per daughter, patient has had intermittent dizziness, however will not make anyone aware of the extent of his symptoms.

## 2012-09-23 ENCOUNTER — Ambulatory Visit (INDEPENDENT_AMBULATORY_CARE_PROVIDER_SITE_OTHER): Payer: Medicare Other | Admitting: Cardiology

## 2012-09-23 ENCOUNTER — Encounter: Payer: Self-pay | Admitting: Cardiology

## 2012-09-23 VITALS — BP 180/100 | HR 91 | Wt 182.0 lb

## 2012-09-23 DIAGNOSIS — I10 Essential (primary) hypertension: Secondary | ICD-10-CM

## 2012-09-23 DIAGNOSIS — I4891 Unspecified atrial fibrillation: Secondary | ICD-10-CM

## 2012-09-23 DIAGNOSIS — I495 Sick sinus syndrome: Secondary | ICD-10-CM

## 2012-09-23 NOTE — Assessment & Plan Note (Signed)
Noted by current outpatient cardiac monitor. Patient has atrial fibrillation at baseline with high heart rates up into the 180s and low heart rates into the 40s with longest pause being 2.7 seconds. He has not had frank syncope but does have intermittent lightheadedness and unsteadiness, presumably related to his bradycardic events. He is on no rate control medications at this time, heart rate is normal today in the clinic. I spoke with the patient and his family about EP consultation with Dr. Ladona Ridgel which is being arranged for Monday. Discussion can be had regarding benefits of pacemaker implantation, and also subsequently anticoagulation for stroke prophylaxis. For now will continue aspirin in light of pending procedures.

## 2012-09-23 NOTE — Progress Notes (Signed)
Clinical Summary Fernando [name] is a 78 y.o.male presenting for office followup. He was seen as an inpatient consult on 10/16 with question of tachycardia-bradycardia syndrome in the setting of chronic atrial fibrillation. Hospital records reviewed. It was arranged for him to wear an outpatient cardiac monitor, with results called to Dr. Dietrich Pates who recommended a followup visit.  ECG today shows atrial fibrillation at 87 BPM. He is not on any rate control medications. Available monitor strips reviewed showed episodes of rapid atrial fibrillation as fast as 180 and also episodes to 40s with longest pause 2.7 seconds. We reviewed these.  He is here with his son and daughter. The patient's son, with whom he lives, states that Fernando [name] has had episodes where he felt unsteady while he was walking, lightheaded, but has not had any frank syncope. This is been going on for quite some time by report. Also tends to have fluctuation in his blood pressure.  Duration of atrial fibrillation is presumably chronic, although this is not well documented. He has not been anticoagulated. We did touch on this somewhat today for stroke prophylaxis, CHADS2 score is 2.  Echocardiogram done recently showed mild LVH with LVEF 55%, trivial mitral regurgitation, moderate left atrial enlargement.  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  No Known Allergies  Current Outpatient Prescriptions  Medication Sig Dispense Refill  . atorvastatin (LIPITOR) 40 MG tablet Take 40 mg by mouth daily.      Marland Kitchen losartan (COZAAR) 50 MG tablet Take 50 mg by mouth daily.        Past Medical History  Diagnosis Date  . Carotid artery disease     Dr. Hart Rochester - s/p left CEA  . Hyperlipidemia   . Essential hypertension, benign   . Arthritis   . Sleep apnea     STOP BANG score =4  . Atrial fibrillation   . Tachycardia-bradycardia syndrome     Social History Fernando [name] reports that he quit smoking about 39 years ago. His  smoking use included Cigarettes. He does not have any smokeless tobacco history on file. Fernando [name] reports that he drinks about .6 ounces of alcohol per week.  Review of Systems No exertional chest pain, NYHA class II dyspnea. No frank syncope. No reported bleeding problems.  Physical Examination Filed Vitals:   09/23/12 0828  BP: 180/100  Pulse: 91   Filed Weights   09/23/12 0828  Weight: 182 lb (82.555 kg)   Patient in no acute distress. Wearing heart monitor. HEENT: Conjunctiva and lids normal, oropharynx clear.  Neck: Supple, no elevated JVP, soft right carotid bruit, CEA scar, no thyromegaly.  Lungs: Clear to auscultation, diminished, nonlabored breathing at rest.  Cardiac: Irregularly irregular, no S3, soft systolic murmur, no pericardial rub.  Abdomen: Soft, nontender, bowel sounds present, no guarding or rebound.  Extremities: No pitting edema, distal pulses 1-2+.  Skin: Warm and dry.  Musculoskeletal: No kyphosis.  Neuropsychiatric: Alert and oriented x3, affect grossly appropriate. Decreased hearing.   Problem List and Plan   Tachy-brady syndrome Noted by current outpatient cardiac monitor. Patient has atrial fibrillation at baseline with high heart rates up into the 180s and low heart rates into the 40s with longest pause being 2.7 seconds. He has not had frank syncope but does have intermittent lightheadedness and unsteadiness, presumably related to his bradycardic events. He is on no rate control medications at this time, heart rate is normal today in the clinic. I spoke with the patient and  his family about EP consultation with Dr. Ladona Ridgel which is being arranged for Monday. Discussion can be had regarding benefits of pacemaker implantation, and also subsequently anticoagulation for stroke prophylaxis. For now will continue aspirin in light of pending procedures.  Atrial fibrillation Chronic, but duration not certain. CHADS2 score is 2 based on age and hypertension.  Anticoagulation would be reasonable ultimately.  Essential hypertension, benign Blood pressure significantly elevated today, had not yet taken Cozaar. Son states it does fluctuate. Will need further medication adjustments after pacemaker is in place.    Jonelle Sidle, M.D., F.A.C.C.

## 2012-09-23 NOTE — Assessment & Plan Note (Signed)
Chronic, but duration not certain. CHADS2 score is 2 based on age and hypertension. Anticoagulation would be reasonable ultimately.

## 2012-09-23 NOTE — Patient Instructions (Addendum)
Your physician recommends that you schedule a follow-up appointment in: Monday with Dr Ladona Ridgel at 11:15 am

## 2012-09-23 NOTE — Assessment & Plan Note (Signed)
Blood pressure significantly elevated today, had not yet taken Cozaar. Son states it does fluctuate. Will need further medication adjustments after pacemaker is in place.

## 2012-09-28 ENCOUNTER — Encounter: Payer: Self-pay | Admitting: Internal Medicine

## 2012-09-28 ENCOUNTER — Ambulatory Visit (INDEPENDENT_AMBULATORY_CARE_PROVIDER_SITE_OTHER): Payer: Medicare Other | Admitting: Internal Medicine

## 2012-09-28 ENCOUNTER — Other Ambulatory Visit: Payer: Self-pay | Admitting: Cardiology

## 2012-09-28 ENCOUNTER — Encounter: Payer: Self-pay | Admitting: *Deleted

## 2012-09-28 VITALS — BP 150/98 | HR 96 | Wt 181.0 lb

## 2012-09-28 DIAGNOSIS — I4891 Unspecified atrial fibrillation: Secondary | ICD-10-CM

## 2012-09-28 DIAGNOSIS — I10 Essential (primary) hypertension: Secondary | ICD-10-CM

## 2012-09-28 DIAGNOSIS — Z01812 Encounter for preprocedural laboratory examination: Secondary | ICD-10-CM

## 2012-09-28 DIAGNOSIS — I495 Sick sinus syndrome: Secondary | ICD-10-CM

## 2012-09-28 LAB — CBC
MCH: 32.1 pg (ref 26.0–34.0)
MCHC: 34.5 g/dL (ref 30.0–36.0)
Platelets: 196 10*3/uL (ref 150–400)
RBC: 5.54 MIL/uL (ref 4.22–5.81)

## 2012-09-28 LAB — BASIC METABOLIC PANEL
Calcium: 9.8 mg/dL (ref 8.4–10.5)
Potassium: 5.2 mEq/L (ref 3.5–5.3)
Sodium: 139 mEq/L (ref 135–145)

## 2012-09-28 LAB — PROTIME-INR
INR: 0.95 (ref <1.50)
Prothrombin Time: 12.6 seconds (ref 11.6–15.2)

## 2012-09-28 LAB — APTT: aPTT: 31.8 seconds (ref 24–37)

## 2012-09-28 NOTE — Assessment & Plan Note (Signed)
His blood pressure is elevated today. We will work on this further once AV nodal blocking agents can be initiated. Carvedilol or metoprolol might be therapeutic.

## 2012-09-28 NOTE — Assessment & Plan Note (Signed)
He actually has documented heart rates as high as 180 beats per minute and as low as 40 beats per minute during the day and pauses of almost 3 seconds off during the day and at night. After a pacemaker has been placed, he will need up titration for improved rate control. I would also consider cardioversion.

## 2012-09-28 NOTE — Patient Instructions (Addendum)
Your physician recommends that you schedule a follow-up appointment in: Post pacemaker implant

## 2012-09-28 NOTE — Assessment & Plan Note (Signed)
The patient will need to be on anticoagulation. This will be initiated after his surgical therapies. My expectation is initiation of beta blockers once his pacemaker is placed.

## 2012-09-28 NOTE — Progress Notes (Signed)
HPI Fernando [name] is referred today by Dr. Diona Browner for evaluation of atrial fibrillation and tachybradycardia syndrome. He is a very pleasant 78 year old man with what appears to be persistent atrial fibrillation. The patient also has a history of a facial cyst as well as carotid vascular disease. He is status post endarterectomy. He has been at having problems with palpitations in the past but is minimally symptomatic. He worked cardiac monitor which demonstrated episodes of atrial fibrillation with ventricular rates of up to 180 beats a minute and as low as 40 beats a minute with nearly 3 second pauses during the daytime and at night. He has never had frank syncope. He does note occasional dizziness when he is active. He denies chest pain or shortness of breath. No peripheral edema. No Known Allergies   Current Outpatient Prescriptions  Medication Sig Dispense Refill  . atorvastatin (LIPITOR) 40 MG tablet Take 40 mg by mouth daily.      Marland Kitchen losartan (COZAAR) 50 MG tablet Take 50 mg by mouth daily.         Past Medical History  Diagnosis Date  . Carotid artery disease     Dr. Hart Rochester - s/p left CEA  . Hyperlipidemia   . Essential hypertension, benign   . Arthritis   . Sleep apnea     STOP BANG score =4  . Atrial fibrillation   . Tachycardia-bradycardia syndrome     ROS:   All systems reviewed and negative except as noted in the HPI.   Past Surgical History  Procedure Date  . Carotid endarterectomy   . Cataract extraction w/ intraocular lens implant     Bilateral     Family History  Problem Relation Age of Onset  . Heart disease Sister      History   Social History  . Marital Status: Widowed    Spouse Name: N/A    Number of Children: N/A  . Years of Education: N/A   Occupational History  . Not on file.   Social History Main Topics  . Smoking status: Former Smoker    Types: Cigarettes    Quit date: 11/25/1972  . Smokeless tobacco: Not on file  . Alcohol Use: 0.6  oz/week    1 Cans of beer per week  . Drug Use: No  . Sexually Active: Not on file   Other Topics Concern  . Not on file   Social History Narrative  . No narrative on file     BP 150/98  Pulse 96  Wt 181 lb (82.101 kg)  Physical Exam:  Well appearing elderly man,NAD HEENT: Unremarkable Neck:  No JVD, no thyromegally Lungs:  Clear with no wheezes, rales, or rhonchi. HEART:  Regular rate rhythm, no murmurs, no rubs, no clicks Abd:  soft, positive bowel sounds, no organomegally, no rebound, no guarding Ext:  2 plus pulses, no edema, no cyanosis, no clubbing Skin:  No rashes no nodules Neuro:  CN II through XII intact, motor grossly intact  24-hour Holter - atrial fibrillation with a rapid ventricular response and bradycardia with pauses of 2.8 seconds   Assess/Plan:

## 2012-09-29 ENCOUNTER — Encounter (HOSPITAL_COMMUNITY): Payer: Self-pay | Admitting: Pharmacist

## 2012-09-29 ENCOUNTER — Other Ambulatory Visit: Payer: Self-pay | Admitting: *Deleted

## 2012-09-29 DIAGNOSIS — I4891 Unspecified atrial fibrillation: Secondary | ICD-10-CM

## 2012-09-30 MED ORDER — CEFAZOLIN SODIUM-DEXTROSE 2-3 GM-% IV SOLR
2.0000 g | INTRAVENOUS | Status: DC
  Filled 2012-09-30 (×2): qty 50

## 2012-09-30 MED ORDER — SODIUM CHLORIDE 0.9 % IR SOLN
80.0000 mg | Status: DC
  Filled 2012-09-30: qty 2

## 2012-10-01 ENCOUNTER — Encounter (HOSPITAL_COMMUNITY)
Admission: RE | Disposition: A | Discharge status: Home / Self Care | Payer: Self-pay | Source: Ambulatory Visit | Attending: Internal Medicine

## 2012-10-01 ENCOUNTER — Ambulatory Visit (HOSPITAL_COMMUNITY)
Admission: RE | Admit: 2012-10-01 | Discharge: 2012-10-02 | Disposition: A | Discharge status: Home / Self Care | Payer: Medicare Other | Source: Ambulatory Visit | Attending: Internal Medicine | Admitting: Internal Medicine

## 2012-10-01 DIAGNOSIS — I495 Sick sinus syndrome: Secondary | ICD-10-CM | POA: Insufficient documentation

## 2012-10-01 DIAGNOSIS — I10 Essential (primary) hypertension: Secondary | ICD-10-CM | POA: Insufficient documentation

## 2012-10-01 DIAGNOSIS — E785 Hyperlipidemia, unspecified: Secondary | ICD-10-CM | POA: Insufficient documentation

## 2012-10-01 DIAGNOSIS — I4891 Unspecified atrial fibrillation: Secondary | ICD-10-CM

## 2012-10-01 HISTORY — PX: PACEMAKER INSERTION: SHX728

## 2012-10-01 HISTORY — PX: PERMANENT PACEMAKER INSERTION: SHX5480

## 2012-10-01 SURGERY — PERMANENT PACEMAKER INSERTION
Anesthesia: LOCAL

## 2012-10-01 MED ORDER — FENTANYL CITRATE 0.05 MG/ML IJ SOLN
INTRAMUSCULAR | Status: AC
  Filled 2012-10-01: qty 2

## 2012-10-01 MED ORDER — HEPARIN (PORCINE) IN NACL 2-0.9 UNIT/ML-% IJ SOLN
INTRAMUSCULAR | Status: AC
  Filled 2012-10-01: qty 500

## 2012-10-01 MED ORDER — MIDAZOLAM HCL 5 MG/5ML IJ SOLN
INTRAMUSCULAR | Status: AC
  Filled 2012-10-01: qty 5

## 2012-10-01 MED ORDER — LIDOCAINE HCL (PF) 1 % IJ SOLN
INTRAMUSCULAR | Status: AC
  Filled 2012-10-01: qty 60

## 2012-10-01 MED ORDER — SODIUM CHLORIDE 0.9 % IJ SOLN: 3.0000 mL | Freq: Two times a day (BID) | INTRAMUSCULAR | Status: DC

## 2012-10-01 MED ORDER — SODIUM CHLORIDE 0.9 % IV SOLN: 250.0000 mL | INTRAVENOUS | Status: DC

## 2012-10-01 MED ORDER — CEFAZOLIN SODIUM-DEXTROSE 2-3 GM-% IV SOLR
2.0000 g | Freq: Four times a day (QID) | INTRAVENOUS | Status: DC
  Filled 2012-10-01 (×2): qty 50

## 2012-10-01 MED ORDER — ACETAMINOPHEN 325 MG PO TABS: 325.0000 mg | ORAL_TABLET | ORAL | Status: DC | PRN

## 2012-10-01 MED ORDER — SODIUM CHLORIDE 0.45 % IV SOLN
INTRAVENOUS | Status: DC
  Administered 2012-10-01: 12:00:00 via INTRAVENOUS

## 2012-10-01 MED ORDER — MUPIROCIN 2 % EX OINT
TOPICAL_OINTMENT | CUTANEOUS | Status: AC
  Filled 2012-10-01: qty 22

## 2012-10-01 MED ORDER — SODIUM CHLORIDE 0.9 % IJ SOLN: 3.0000 mL | INTRAMUSCULAR | Status: DC | PRN

## 2012-10-01 MED ORDER — CEFAZOLIN SODIUM-DEXTROSE 2-3 GM-% IV SOLR
2.0000 g | Freq: Four times a day (QID) | INTRAVENOUS | Status: AC
  Administered 2012-10-01 – 2012-10-02 (×3): 2 g via INTRAVENOUS
  Filled 2012-10-01 (×3): qty 50

## 2012-10-01 MED ORDER — ONDANSETRON HCL 4 MG/2ML IJ SOLN: 4.0000 mg | Freq: Four times a day (QID) | INTRAMUSCULAR | Status: DC | PRN

## 2012-10-01 MED ORDER — LOSARTAN POTASSIUM 50 MG PO TABS
50.0000 mg | ORAL_TABLET | Freq: Every morning | ORAL | Status: DC
  Administered 2012-10-02: 50 mg via ORAL
  Filled 2012-10-01 (×2): qty 1

## 2012-10-01 MED ORDER — MUPIROCIN 2 % EX OINT
TOPICAL_OINTMENT | Freq: Two times a day (BID) | CUTANEOUS | Status: DC
  Administered 2012-10-01: 1 via NASAL
  Administered 2012-10-02: 01:00:00 via NASAL
  Filled 2012-10-01 (×2): qty 22

## 2012-10-01 MED ORDER — ASPIRIN EC 81 MG PO TBEC
81.0000 mg | DELAYED_RELEASE_TABLET | Freq: Every morning | ORAL | Status: DC
  Administered 2012-10-02: 81 mg via ORAL
  Filled 2012-10-01 (×2): qty 1

## 2012-10-01 NOTE — Progress Notes (Signed)
Received pt from [facility] had pacer placed today. Oriented to room. PLaced on telemetry box . Given po fluids.  Pace site clean and dry Lt chest T [employer]

## 2012-10-01 NOTE — Progress Notes (Signed)
UR Completed Ionna Avis Graves-Bigelow, RN,BSN [phone]  

## 2012-10-01 NOTE — H&P (View-Only) (Signed)
HPI Fernando [name] is referred today by Fernando [name] for evaluation of atrial fibrillation and tachybradycardia syndrome. He is a very pleasant 78-year-old man with what appears to be persistent atrial fibrillation. The patient also has a history of a facial cyst as well as carotid vascular disease. He is status post endarterectomy. He has been at having problems with palpitations in the past but is minimally symptomatic. He worked cardiac monitor which demonstrated episodes of atrial fibrillation with ventricular rates of up to 180 beats a minute and as low as 40 beats a minute with nearly 3 second pauses during the daytime and at night. He has never had frank syncope. He does note occasional dizziness when he is active. He denies chest pain or shortness of breath. No peripheral edema. No Known Allergies   Current Outpatient Prescriptions  Medication Sig Dispense Refill  . atorvastatin (LIPITOR) 40 MG tablet Take 40 mg by mouth daily.      . losartan (COZAAR) 50 MG tablet Take 50 mg by mouth daily.         Past Medical History  Diagnosis Date  . Carotid artery disease     Fernando [name] - s/p left CEA  . Hyperlipidemia   . Essential hypertension, benign   . Arthritis   . Sleep apnea     STOP BANG score =4  . Atrial fibrillation   . Tachycardia-bradycardia syndrome     ROS:   All systems reviewed and negative except as noted in the HPI.   Past Surgical History  Procedure Date  . Carotid endarterectomy   . Cataract extraction w/ intraocular lens implant     Bilateral     Family History  Problem Relation Age of Onset  . Heart disease Sister      History   Social History  . Marital Status: Widowed    Spouse Name: N/A    Number of Children: N/A  . Years of Education: N/A   Occupational History  . Not on file.   Social History Main Topics  . Smoking status: Former Smoker    Types: Cigarettes    Quit date: 11/25/1972  . Smokeless tobacco: Not on file  . Alcohol Use: 0.6  oz/week    1 Cans of beer per week  . Drug Use: No  . Sexually Active: Not on file   Other Topics Concern  . Not on file   Social History Narrative  . No narrative on file     BP 150/98  Pulse 96  Wt 181 lb (82.101 kg)  Physical Exam:  Well appearing elderly man,NAD HEENT: Unremarkable Neck:  No JVD, no thyromegally Lungs:  Clear with no wheezes, rales, or rhonchi. HEART:  Regular rate rhythm, no murmurs, no rubs, no clicks Abd:  soft, positive bowel sounds, no organomegally, no rebound, no guarding Ext:  2 plus pulses, no edema, no cyanosis, no clubbing Skin:  No rashes no nodules Neuro:  CN II through XII intact, motor grossly intact  24-hour Holter - atrial fibrillation with a rapid ventricular response and bradycardia with pauses of 2.8 seconds   Assess/Plan:  

## 2012-10-01 NOTE — Interval H&P Note (Signed)
History and Physical Interval Note:  10/01/2012 11:25 AM  Fernando [name]  has presented today for surgery, with the diagnosis of Tachy/Brady  The various methods of treatment have been discussed with the patient and family. After consideration of risks, benefits and other options for treatment, the patient has consented to  Procedure(s) (LRB) with comments: PERMANENT PACEMAKER INSERTION (N/A) as a surgical intervention .  The patient's history has been reviewed, patient examined, no change in status, stable for surgery.  I have reviewed the patient's chart and labs.  Questions were answered to the patient's satisfaction.     Leonia Reeves.D.

## 2012-10-01 NOTE — Op Note (Signed)
DDD PPM insertion via the left subclavian vein without immediate complication. [id number].

## 2012-10-02 ENCOUNTER — Encounter: Payer: Medicare Other | Admitting: Adult Health

## 2012-10-02 ENCOUNTER — Ambulatory Visit (HOSPITAL_COMMUNITY): Payer: Medicare Other

## 2012-10-02 ENCOUNTER — Encounter: Payer: Self-pay | Admitting: *Deleted

## 2012-10-02 DIAGNOSIS — Z950 Presence of cardiac pacemaker: Secondary | ICD-10-CM | POA: Insufficient documentation

## 2012-10-02 NOTE — Discharge Summary (Signed)
ELECTROPHYSIOLOGY DISCHARGE SUMMARY    Patient ID: Fernando [name],  MRN: [id number], DOB/AGE: 12-15-1926 78 y.o.  Admit date: 10/01/2012 Discharge date: 10/02/2012  Primary Care Physician: Butch Penny, MD Primary Cardiologist: Diona Browner, MD  Primary Discharge Diagnosis:  1. Tachy-brady syndrome s/p PPM implantation  Secondary Discharge Diagnoses:  1. Atrial fibrillation 2. HTN 3. Dyslipidemia 4. OSA 5. Osteoarthritis  Procedures This Admission:  1. Dual chamber PPM implantation 10/01/2012 St. Jude model 2088T 52 cm active fixation pacing lead, serial #[id number] being advanced into the right ventricle. St. Jude model 2088T 46 cm active fixation pacing lead, serial #[id number] being advanced to the right atrium. St. Jude dual-chamber pacemaker serial [id number].  History and Hospital Course:  [name] is a pleasant 78 year old gentleman who was referred to Dr. Ladona Ridgel for EP recommendations regarding tachy-brady syndrome. Permanent pacemaker (PPM) implantation was recommended. [name] presented on 10/01/2012 for PPM implantation. He tolerated this procedure well without any immediate complication. He remains hemodynamically stable and afebrile. His chest xray shows stable lead placement without pneumothorax. His device interrogation has been reviewed by Dr. Ladona Ridgel and shows normal PPM function with stable lead parameters/measurements. His implant site is intact without significant bleeding or hematoma. He has been given discharge instructions including wound care and activity restrictions. He will follow-up in 10 days for wound check. There were no changes made to his medications. He has been seen, examined and deemed stable for discharge today by Dr. Lewayne Bunting.  Discharge Vitals: Blood pressure 140/83, pulse 75, temperature 98.1 F (36.7 C), temperature source Oral, resp. rate 18, height 5\' 6"  (1.676 m), weight 181 lb (82.101 kg), SpO2 94.00%.   Labs: Lab Results  Component  Value Date   WBC 8.9 09/28/2012   HGB 17.8* 09/28/2012   HCT 51.6 09/28/2012   MCV 93.1 09/28/2012   PLT 196 09/28/2012    Lab 09/28/12 1308  NA 139  K 5.2  CL 101  CO2 27  BUN 27*  CREATININE 0.95  CALCIUM 9.8  PROT --  BILITOT --  ALKPHOS --  ALT --  AST --  GLUCOSE 96    Disposition:  The patient is being discharged in stable condition.  Follow-up: Follow-up Information    Follow up with  [facility] On 10/14/2012. (At 3:30 PM for wound check)    Contact information:   [address]  [address] [place] [address] [phone]      Follow up with Lewayne Bunting, MD. On 01/11/2013. (At 8:30 AM )    Contact information:   [address] [place] [address] [phone]        Discharge Medications:    Medication List     As of 10/02/2012 10:03 AM    TAKE these medications         ALEVE 220 MG tablet   Generic drug: naproxen sodium   Take 440-660 mg by mouth every 12 (twelve) hours as needed. For headaches      aspirin EC 81 MG tablet   Take 81 mg by mouth every morning.      atorvastatin 40 MG tablet   Commonly known as: LIPITOR   Take 40 mg by mouth every morning.      Glucosamine HCl 1500 MG Tabs   Take 1,500 mg by mouth every morning.      losartan 50 MG tablet   Commonly known as: COZAAR   Take 50 mg by mouth every morning.  Duration of Discharge Encounter: Greater than 30 minutes including physician time.  Signed, Rick Duff, PA-C 10/02/2012, 10:03 AM

## 2012-10-02 NOTE — Op Note (Signed)
NAMECOLVIN, BLATT NO.:  [id number]  MEDICAL RECORD NO.:  [id number]  LOCATION:  3W16C                        FACILITY:  [facility]  PHYSICIAN:  Doylene Canning. Ladona Ridgel, MD    DATE OF BIRTH:  1927/04/11  DATE OF PROCEDURE:  10/01/2012 DATE OF DISCHARGE:                              OPERATIVE REPORT   PROCEDURE PERFORMED:  Insertion of a dual-chamber pacemaker.  INDICATION:  Symptomatic tachybrady syndrome.  INTRODUCTION:  The patient is an 78 year old male with a history of symptomatic atrial fibrillation with a rapid ventricular response and a slow ventricular response.  He has had heart rates in the 180s as well as heart rates in the 30s.  He is now referred for permanent pacemaker insertion.  DESCRIPTION OF PROCEDURE:  After informed consent was obtained, the patient was taken to the diagnostic EP lab in a fasting state.  After usual preparation and draping, intravenous fentanyl and midazolam was given for sedation.  A 30 mL of lidocaine was infiltrated into the left infraclavicular region.  A 5-cm incision was carried out over this region.  Electrocautery was utilized to dissect down to the fascial plane.  The left subclavian vein was punctured x2 and the St. Jude model 2088T 52 cm active fixation pacing lead, serial #[id number] being advanced into the right ventricle and the St. Jude model 2088T 46 cm active fixation pacing lead, serial #[id number] being advanced to the right atrium.  Mapping was carried out in the right ventricle first.  At the final site, the R-waves were 8, the pacing threshold was a V at 0.4 milliseconds and the impedance was 730 ohms.  10 V pacing did not stimulate the diaphragm.  A large injury current was present with active fixation of the lead.  With these satisfactory parameters, attention was then turned to placement of the atrial lead was placed in anterolateral portion of the right atrium where fibrillation waves measured between 0.51  mV.  The pacing impedance was 700 ohms.  The lead actively fixed. Again 10 V pacing did not stimulate the diaphragm.  With these satisfactory parameters, the leads were secured to the subpectoral fascia with a figure-of-eight silk suture.  The sewing sleeve was secured with silk suture.  Electrocautery was then utilized to make a subcutaneous pocket.  Antibiotic irrigation was utilized to irrigate the pocket.  Electrocautery was utilized to assure hemostasis.  The St. Jude dual-chamber pacemaker serial [id number] was connected to the atrial and RV leads and placed back in the subcutaneous pocket.  The pocket was secured with silk suture.  The pocket was irrigated with antibiotic irrigation and the incision was closed with 2-0 and 3-0 Vicryl.  Benzoin and Steri-Strips painted on the skin, pressure dressing was applied. The patient was returned to his room in satisfactory condition.  COMPLICATIONS:  There were no immediate procedure complications.  RESULTS:  This demonstrate successful implantation of a St. Jude dual- chamber pacemaker in a patient with symptomatic bradycardia and atrial fibrillation.     Doylene Canning. Ladona Ridgel, MD     GWT/MEDQ  D:  10/01/2012  T:  10/02/2012  Job:  [id number]

## 2012-10-02 NOTE — Progress Notes (Signed)
     Patient: Fernando [name] Date of Encounter: 10/02/2012, 7:59 AM Admit date: 10/01/2012     Subjective  Fernando [name] has no complaints. He denies CP, SOB or palpitations.   Objective  Physical Exam: Vitals: BP 140/83  Pulse 75  Temp 98.1 F (36.7 C) (Oral)  Resp 18  Ht 5\' 6"  (1.676 m)  Wt 181 lb (82.101 kg)  BMI 29.21 kg/m2  SpO2 94% General: Well developed, well appearing 78 year old male in no acute distress. Neck: Supple. JVD not elevated. Lungs: Clear bilaterally to auscultation without wheezes, rales, or rhonchi. Breathing is unlabored. Heart: Irregularly irregular S1 S2 without murmurs, rubs, or gallops.  Abdomen: Soft, non-distended. Extremities: No clubbing or cyanosis. No edema.  Distal pedal pulses are 2+ and equal bilaterally. Neuro: Alert and oriented X 3. Moves all extremities spontaneously. No focal deficits. Skin: Left upper chest/PPM site intact. No ecchymosis, edema, bleeding or hematoma  Intake/Output:  Intake/Output Summary (Last 24 hours) at 10/02/12 0759 Last data filed at 10/02/12 0640  Gross per 24 hour  Intake    615 ml  Output    830 ml  Net   -215 ml    Inpatient Medications:     . aspirin EC  81 mg Oral q morning - 10a  . [COMPLETED]  ceFAZolin (ANCEF) IV  2 g Intravenous Q6H  . [COMPLETED] fentaNYL      . [COMPLETED] heparin      . [COMPLETED] lidocaine      . losartan  50 mg Oral q morning - 10a  . [COMPLETED] midazolam      . mupirocin ointment   Nasal BID  . [EXPIRED] mupirocin ointment      . [DISCONTINUED]  ceFAZolin (ANCEF) IV  2 g Intravenous On Call  . [DISCONTINUED]  ceFAZolin (ANCEF) IV  2 g Intravenous Q6H  . [DISCONTINUED] gentamicin irrigation  80 mg Irrigation On Call  . [DISCONTINUED] sodium chloride  3 mL Intravenous Q12H      . [DISCONTINUED] sodium chloride 50 mL/hr at 10/01/12 1142  . [DISCONTINUED] sodium chloride Stopped (10/01/12 1100)    Labs: None in last 48 hours   Radiology/Studies: Dg Chest 2  View  10/02/2012  *RADIOLOGY REPORT*  Clinical Data: Status post pacemaker insertion  CHEST - 2 VIEW  Comparison: 12/21/2007  Findings: The pacemaker is now seen.  The cardiac shadow is within normal limits.  The lungs are clear.  No pneumothorax is noted.  IMPRESSION: No acute abnormality following pacemaker placement.   Original Report Authenticated By: Alcide Clever, M.D.     Telemetry: atrial fibrillation with intermittent V pacing Device interrogation: performed by industry shows normal PPM function with stable lead measurements    Assessment and Plan  1. Tachy-brady syndrome s/p PPM implant yesterday - wound ok, device check normal PPM function, CXR shows stable lead placement, negative for PTX; wound care and DC instructions reviewed, follow-up appts made; plan for DC this AM 2. Atrial fibrillation  Signed, EDMISTEN, BROOKE PA-C  EP Attending  Patient seen and examined. Agree with above. Ok for discharge, usual followup.  Leonia Reeves.D.

## 2012-10-05 ENCOUNTER — Other Ambulatory Visit: Payer: Self-pay | Admitting: Otolaryngology

## 2012-10-07 ENCOUNTER — Telehealth: Payer: Self-pay | Admitting: Internal Medicine

## 2012-10-07 NOTE — Telephone Encounter (Signed)
PT SON TOM WANTS TO SPEAK TO TOMI ABOUT FATHERS PACEMAKER

## 2012-10-07 NOTE — Telephone Encounter (Signed)
Message left for a return call

## 2012-10-08 NOTE — Telephone Encounter (Signed)
Patient received Merlin in the mail.  Advised them that it would be discussed with them at his follow up visit.

## 2012-10-14 ENCOUNTER — Ambulatory Visit (INDEPENDENT_AMBULATORY_CARE_PROVIDER_SITE_OTHER): Payer: Medicare Other | Admitting: *Deleted

## 2012-10-14 ENCOUNTER — Encounter: Payer: Self-pay | Admitting: Internal Medicine

## 2012-10-14 DIAGNOSIS — I4891 Unspecified atrial fibrillation: Secondary | ICD-10-CM

## 2012-10-14 LAB — PACEMAKER DEVICE OBSERVATION
AL AMPLITUDE: 0.5 mv
AL IMPEDENCE PM: 537.5 Ohm
BAMS-0001: 150 {beats}/min
BAMS-0003: 70 {beats}/min
BATTERY VOLTAGE: 3.023 V
BRDY-0003RV: 110 {beats}/min
RV LEAD AMPLITUDE: 12 mv
RV LEAD IMPEDENCE PM: 537.5 Ohm
RV LEAD THRESHOLD: 0.5 V

## 2012-10-14 NOTE — Progress Notes (Signed)
Wound check-PPM 

## 2012-10-20 ENCOUNTER — Encounter (HOSPITAL_COMMUNITY): Payer: Self-pay

## 2012-10-20 ENCOUNTER — Encounter (HOSPITAL_COMMUNITY)
Admission: RE | Admit: 2012-10-20 | Discharge: 2012-10-20 | Disposition: A | Discharge status: Home / Self Care | Payer: Medicare Other | Source: Ambulatory Visit | Attending: Otolaryngology | Admitting: Otolaryngology

## 2012-10-20 LAB — BASIC METABOLIC PANEL
BUN: 21 mg/dL (ref 6–23)
Chloride: 102 mEq/L (ref 96–112)
Creatinine, Ser: 0.89 mg/dL (ref 0.50–1.35)
GFR calc Af Amer: 89 mL/min — ABNORMAL LOW (ref >90)
GFR calc non Af Amer: 77 mL/min — ABNORMAL LOW (ref >90)

## 2012-10-20 LAB — CBC
HCT: 49.1 % (ref 39.0–52.0)
MCHC: 33.8 g/dL (ref 30.0–36.0)
RDW: 13.1 % (ref 11.5–15.5)

## 2012-10-20 NOTE — Pre-Procedure Instructions (Signed)
20 Taegen Molesky  10/20/2012   Your procedure is scheduled on:  10-28-2012  Report to [facility] at 8:00 AM. Take the [facility] to the 3rd floor  Call this number if you have problems the morning of surgery: [phone]   Remember:   Do not eat food or drink:After Midnight.                  Take these medicines the morning of surgery with A SIP OF WATER: Aspirin,Atorvastatin(Lipitor)    Do not wear jewelry,.  Do not wear lotions, powders, or perfume  Do not shave 48 hours prior to surgery. Men may shave face and neck.  Do not bring valuables to the hospital.  Contacts, dentures or bridgework may not be worn into surgery.  Leave suitcase in the car. After surgery it may be brought to your room.   For patients admitted to the hospital, checkout time is 11:00 AM the day of discharge.   l   Special Instructions: Shower using CHG 2 nights before surgery and the night before surgery.  If you shower the day of surgery use CHG.  Use special wash - you have one bottle of CHG for all showers.  You should use approximately 1/3 of the bottle for each shower.     Please read over the following fact sheets that you were given: Pain Booklet, Coughing and Deep Breathing and Surgical Site Infection Prevention

## 2012-10-20 NOTE — Progress Notes (Signed)
Cardiac Implant Peri-operative orders faxed to [facility].

## 2012-10-20 NOTE — Progress Notes (Signed)
Peri-Operative Cardiac Implanted Device orders placed in chart.

## 2012-10-21 ENCOUNTER — Telehealth: Payer: Self-pay | Admitting: *Deleted

## 2012-10-21 NOTE — Consult Note (Addendum)
Anesthesia Chart Review:  Patient is a 78 year old male scheduled for a right superficial parotidectomy for evaluation of a mass by Dr. Suszanne Conners on 10/28/12. Apparently, this procedure was initially scheduled for 09/09/2012, but after the patient was intubated he developed atrial fibrillation with rapid ventricular response followed by bradycardia with a rate in the 30's. The patient was extubated, and the surgery was canceled.  He was evaluated by Cardiologist Dr. Daleen Squibb and Dr. Diona Browner.  He had an out-patient Holter which showed afib up to 180's and bradycardia into the 40's with occasional pauses up to 2.7 seconds. Dr. Diona Browner referred patient to EP cardiologist Dr. Ladona Ridgel, and he ultimately underwent insertion of a St. Jude pacemaker.  There is mention of consideration of cardioversion and anticoagulation in the future, but the desired timing is unclear.  Dr. Ladona Ridgel is out of town until 10/27/12. I spoke with Tomi at Dr. Ival Bible office to see if he could clarify cardiology's plan for Mr. Ruffins. Since Dr. Ladona Ridgel has most recently been managing Mr. Yambao, Dr. Diona Browner would prefer for Dr. Ladona Ridgel to address the specifics regarding possible plans for cardioversion, B-blocker and anticoagulation therapies. I have routed a message to Dr. Allyn Kenner, as the office says he is out of town until 10/27/12.  I have updated Anesthesiologist Dr. Noreene Larsson and Selena Batten at Dr. Avel Sensor office.  Selena Batten is also going to update the patient's daughter (who had previously told Selena Batten that the "cardiologist" said patient was okay for surgery).  History includes former smoker, carotid occlusive disease s/p left CEA 11/2007, HTN, afib, OSA, HLD, tachy-brady syndrome s/p insertion of a St. Jude dual-chamber PPM on 10/01/12 (Dr. Ladona Ridgel).  EKG on 10/02/12 showed v-pacing with underlying afib.  Echo on 09/09/12 showed: - Left ventricle: The cavity size was normal. Wall thickness was increased in a pattern of mild LVH. Systolic  function was normal. The estimated ejection fraction was 55% in the setting of atrial fibrillation. Although no diagnostic regional wall motion abnormality was identified, this possibility cannot be completely excluded on the basis of this study. The study is not technically sufficient to allow evaluation of LV diastolic function. - Aortic valve: Mildly calcified annulus. Trileaflet; mildly thickened leaflets. Trivial regurgitation. - Mitral valve: Calcified annulus. Trivial regurgitation. - Left atrium: The atrium was moderately dilated. - Tricuspid valve: Trivial regurgitation. - Pericardium, extracardiac: There was no pericardial effusion.  CAROTID DUPLEX 03/19/2012: Right ICA 60 - 79 % stenosis; Left ICA 0 - 19% stenosis.  (Patient is scheduled for follow-up on 03/19/13 at [facility]).  CXR on 10/02/12 showed: No acute abnormality following pacemaker placement.   Pre-operative labs noted.  I'll follow-up once additional input from Dr. Ladona Ridgel is received.  Shonna Chock, PA-C   10/21/12 1530  Addendum:  10/27/12 1230 I spoke with Dr. Ladona Ridgel today to notify him of planned procedure.  He did recommend patient be started on B-blocker therapy (which is not currently listed on his medication list).  He will consider anticoagulation and possible cardioversion post-operatively when felt safe.  He plans to call in a prescription for Mr. Kyllonen for metoprolol 25 mg po BID.  His staff will instruct Mr. Peeters to take 50 mg po this evening and 25 mg po in the morning before his surgery.

## 2012-10-21 NOTE — Consult Note (Signed)
Error

## 2012-10-21 NOTE — Consult Note (Signed)
See my previous anesthesia note.  Shonna Chock, PA-C

## 2012-10-21 NOTE — Telephone Encounter (Signed)
Received TFC from [facility], anesthesia at [facility], inquiring about need for further cardiac testing prior to parotidectomy, per Dr Suszanne Conners. Consulted with Dr Diona Browner, who states that this would be best assessed by Dr Ladona Ridgel, as patient does not have CAD and has seen Dr Ladona Ridgel on a regular basis for tachy brady syndrome, along with atrial fibrillation and recent PPM implant.  States he would be happy to help with this on an urgent basis, if needed.

## 2012-10-27 ENCOUNTER — Telehealth: Payer: Self-pay | Admitting: *Deleted

## 2012-10-27 MED ORDER — METOPROLOL TARTRATE 25 MG PO TABS: 25.0000 mg | ORAL_TABLET | Freq: Two times a day (BID) | ORAL | Status: DC

## 2012-10-27 NOTE — Telephone Encounter (Signed)
I have left the patient a message to call me.  Dr Ladona Ridgel wants him to start on Metoprolol 25mg  twice daily.  Take 2(50mg ) tonight and 1(25mg ) tomorrow morning prior to surgery.  Then will take 1(25mg )po bid thereafter.

## 2012-10-27 NOTE — Telephone Encounter (Signed)
lmom again for patient in regards to the medication that needs to be started.  I have called the medication in for the patient but have been unable to talk with him

## 2012-10-28 ENCOUNTER — Encounter (HOSPITAL_COMMUNITY): Payer: Self-pay | Admitting: *Deleted

## 2012-10-28 ENCOUNTER — Ambulatory Visit (HOSPITAL_COMMUNITY)
Admission: RE | Admit: 2012-10-28 | Discharge: 2012-10-29 | Disposition: A | Discharge status: Home / Self Care | Payer: Medicare Other | Source: Ambulatory Visit | Attending: Otolaryngology | Admitting: Otolaryngology

## 2012-10-28 ENCOUNTER — Encounter (HOSPITAL_COMMUNITY): Payer: Self-pay | Admitting: Vascular Surgery

## 2012-10-28 ENCOUNTER — Encounter (HOSPITAL_COMMUNITY): Payer: Self-pay | Admitting: General Practice

## 2012-10-28 ENCOUNTER — Encounter (HOSPITAL_COMMUNITY)
Admission: RE | Disposition: A | Discharge status: Home / Self Care | Payer: Self-pay | Source: Ambulatory Visit | Attending: Otolaryngology

## 2012-10-28 ENCOUNTER — Ambulatory Visit (HOSPITAL_COMMUNITY): Payer: Medicare Other | Admitting: Vascular Surgery

## 2012-10-28 DIAGNOSIS — Z01812 Encounter for preprocedural laboratory examination: Secondary | ICD-10-CM | POA: Insufficient documentation

## 2012-10-28 DIAGNOSIS — I6529 Occlusion and stenosis of unspecified carotid artery: Secondary | ICD-10-CM | POA: Insufficient documentation

## 2012-10-28 DIAGNOSIS — Z9049 Acquired absence of other specified parts of digestive tract: Secondary | ICD-10-CM

## 2012-10-28 DIAGNOSIS — I10 Essential (primary) hypertension: Secondary | ICD-10-CM | POA: Insufficient documentation

## 2012-10-28 DIAGNOSIS — I4891 Unspecified atrial fibrillation: Secondary | ICD-10-CM | POA: Insufficient documentation

## 2012-10-28 DIAGNOSIS — C07 Malignant neoplasm of parotid gland: Secondary | ICD-10-CM | POA: Insufficient documentation

## 2012-10-28 DIAGNOSIS — E785 Hyperlipidemia, unspecified: Secondary | ICD-10-CM | POA: Insufficient documentation

## 2012-10-28 HISTORY — PX: PARATHYROIDECTOMY: SHX19

## 2012-10-28 HISTORY — PX: PAROTIDECTOMY: SHX2163

## 2012-10-28 SURGERY — EXCISION, PAROTID GLAND
Anesthesia: General | Site: Neck | Laterality: Right | Wound class: Clean

## 2012-10-28 MED ORDER — LIDOCAINE HCL (CARDIAC) 20 MG/ML IV SOLN
INTRAVENOUS | Status: DC | PRN
  Administered 2012-10-28: 20 mg via INTRAVENOUS

## 2012-10-28 MED ORDER — PROPOFOL 10 MG/ML IV BOLUS
INTRAVENOUS | Status: DC | PRN
  Administered 2012-10-28: 130 mg via INTRAVENOUS

## 2012-10-28 MED ORDER — CEFAZOLIN SODIUM 1-5 GM-% IV SOLN
2.0000 g | Freq: Once | INTRAVENOUS | Status: AC
  Administered 2012-10-28: 2 g via INTRAVENOUS

## 2012-10-28 MED ORDER — FENTANYL CITRATE 0.05 MG/ML IJ SOLN
INTRAMUSCULAR | Status: DC | PRN
  Administered 2012-10-28 (×4): 25 ug via INTRAVENOUS
  Administered 2012-10-28: 100 ug via INTRAVENOUS
  Administered 2012-10-28: 25 ug via INTRAVENOUS

## 2012-10-28 MED ORDER — LACTATED RINGERS IV SOLN
INTRAVENOUS | Status: DC | PRN
  Administered 2012-10-28 (×2): via INTRAVENOUS

## 2012-10-28 MED ORDER — CEFAZOLIN SODIUM 1-5 GM-% IV SOLN
1.0000 g | Freq: Three times a day (TID) | INTRAVENOUS | Status: DC
  Administered 2012-10-28 – 2012-10-29 (×2): 1 g via INTRAVENOUS
  Filled 2012-10-28 (×3): qty 50

## 2012-10-28 MED ORDER — ZOLPIDEM TARTRATE 5 MG PO TABS: 5.0000 mg | ORAL_TABLET | Freq: Every evening | ORAL | Status: DC | PRN

## 2012-10-28 MED ORDER — OXYCODONE-ACETAMINOPHEN 5-325 MG PO TABS: 1.0000 | ORAL_TABLET | ORAL | Status: DC | PRN

## 2012-10-28 MED ORDER — SODIUM CHLORIDE 0.9 % IV SOLN
10.0000 mg | INTRAVENOUS | Status: DC | PRN
  Administered 2012-10-28: 15 ug/min via INTRAVENOUS

## 2012-10-28 MED ORDER — PROMETHAZINE HCL 12.5 MG PO TABS
12.5000 mg | ORAL_TABLET | Freq: Four times a day (QID) | ORAL | Status: DC | PRN
  Filled 2012-10-28: qty 1

## 2012-10-28 MED ORDER — KCL IN DEXTROSE-NACL 20-5-0.45 MEQ/L-%-% IV SOLN
INTRAVENOUS | Status: DC
  Administered 2012-10-28: 18:00:00 via INTRAVENOUS
  Filled 2012-10-28 (×3): qty 1000

## 2012-10-28 MED ORDER — LIDOCAINE-EPINEPHRINE 1 %-1:100000 IJ SOLN
INTRAMUSCULAR | Status: AC
  Filled 2012-10-28: qty 1

## 2012-10-28 MED ORDER — LIDOCAINE HCL 4 % MT SOLN
OROMUCOSAL | Status: DC | PRN
  Administered 2012-10-28: 4 mL via TOPICAL

## 2012-10-28 MED ORDER — PHENYLEPHRINE HCL 10 MG/ML IJ SOLN
INTRAMUSCULAR | Status: DC | PRN
  Administered 2012-10-28: 80 ug via INTRAVENOUS
  Administered 2012-10-28: 120 ug via INTRAVENOUS
  Administered 2012-10-28: 80 ug via INTRAVENOUS
  Administered 2012-10-28: 160 ug via INTRAVENOUS
  Administered 2012-10-28: 120 ug via INTRAVENOUS
  Administered 2012-10-28: 40 ug via INTRAVENOUS
  Administered 2012-10-28: 80 ug via INTRAVENOUS
  Administered 2012-10-28: 120 ug via INTRAVENOUS

## 2012-10-28 MED ORDER — LIDOCAINE-EPINEPHRINE 1 %-1:100000 IJ SOLN
INTRAMUSCULAR | Status: DC | PRN
  Administered 2012-10-28: 20 mL

## 2012-10-28 MED ORDER — METOPROLOL TARTRATE 25 MG PO TABS
25.0000 mg | ORAL_TABLET | Freq: Two times a day (BID) | ORAL | Status: DC
  Administered 2012-10-28: 25 mg via ORAL
  Filled 2012-10-28 (×4): qty 1

## 2012-10-28 MED ORDER — 0.9 % SODIUM CHLORIDE (POUR BTL) OPTIME
TOPICAL | Status: DC | PRN
  Administered 2012-10-28: 1000 mL

## 2012-10-28 MED ORDER — ONDANSETRON HCL 4 MG/2ML IJ SOLN
INTRAMUSCULAR | Status: DC | PRN
  Administered 2012-10-28: 4 mg via INTRAVENOUS

## 2012-10-28 MED ORDER — LACTATED RINGERS IV SOLN
INTRAVENOUS | Status: DC
  Administered 2012-10-28: 10:00:00 via INTRAVENOUS

## 2012-10-28 MED ORDER — PROMETHAZINE HCL 12.5 MG RE SUPP
12.5000 mg | Freq: Four times a day (QID) | RECTAL | Status: DC | PRN
  Filled 2012-10-28: qty 1

## 2012-10-28 MED ORDER — SUCCINYLCHOLINE CHLORIDE 20 MG/ML IJ SOLN
INTRAMUSCULAR | Status: DC | PRN
  Administered 2012-10-28: 100 mg via INTRAVENOUS

## 2012-10-28 MED ORDER — CEFAZOLIN SODIUM-DEXTROSE 2-3 GM-% IV SOLR
INTRAVENOUS | Status: AC
  Filled 2012-10-28: qty 50

## 2012-10-28 MED ORDER — ATORVASTATIN CALCIUM 40 MG PO TABS
40.0000 mg | ORAL_TABLET | Freq: Every morning | ORAL | Status: DC
  Filled 2012-10-28: qty 1

## 2012-10-28 MED ORDER — MORPHINE SULFATE 2 MG/ML IJ SOLN
2.0000 mg | INTRAMUSCULAR | Status: DC | PRN
  Administered 2012-10-28 (×2): 2 mg via INTRAVENOUS
  Filled 2012-10-28 (×2): qty 1

## 2012-10-28 MED ORDER — LOSARTAN POTASSIUM 50 MG PO TABS
50.0000 mg | ORAL_TABLET | Freq: Every morning | ORAL | Status: DC
  Filled 2012-10-28: qty 1

## 2012-10-28 SURGICAL SUPPLY — 54 items
ADH SKN CLS APL DERMABOND .7 (GAUZE/BANDAGES/DRESSINGS) ×1
ATTRACTOMAT 16X20 MAGNETIC DRP (DRAPES) IMPLANT
BLADE SURG 15 STRL LF DISP TIS (BLADE) ×1 IMPLANT
BLADE SURG 15 STRL SS (BLADE) ×2
CANISTER SUCTION 2500CC (MISCELLANEOUS) ×2 IMPLANT
CLEANER TIP ELECTROSURG 2X2 (MISCELLANEOUS) ×2 IMPLANT
CLOTH BEACON ORANGE TIMEOUT ST (SAFETY) ×2 IMPLANT
CONT SPEC 4OZ CLIKSEAL STRL BL (MISCELLANEOUS) ×2 IMPLANT
CORDS BIPOLAR (ELECTRODE) ×2 IMPLANT
COVER SURGICAL LIGHT HANDLE (MISCELLANEOUS) ×2 IMPLANT
DERMABOND ADVANCED (GAUZE/BANDAGES/DRESSINGS) ×1
DERMABOND ADVANCED .7 DNX12 (GAUZE/BANDAGES/DRESSINGS) IMPLANT
DRAIN JACKSON PRT FLT 10 (DRAIN) ×1 IMPLANT
DRAIN JACKSON RD 7FR 3/32 (WOUND CARE) IMPLANT
DRAPE INCISE 13X13 STRL (DRAPES) ×2 IMPLANT
ELECT COATED BLADE 2.86 ST (ELECTRODE) ×2 IMPLANT
ELECT REM PT RETURN 9FT ADLT (ELECTROSURGICAL) ×2
ELECTRODE REM PT RTRN 9FT ADLT (ELECTROSURGICAL) ×1 IMPLANT
EVACUATOR SILICONE 100CC (DRAIN) ×1 IMPLANT
GAUZE SPONGE 4X4 16PLY XRAY LF (GAUZE/BANDAGES/DRESSINGS) ×4 IMPLANT
GLOVE BIO SURGEON STRL SZ 6.5 (GLOVE) ×1 IMPLANT
GLOVE BIOGEL PI IND STRL 6 (GLOVE) IMPLANT
GLOVE BIOGEL PI IND STRL 8 (GLOVE) IMPLANT
GLOVE BIOGEL PI INDICATOR 6 (GLOVE) ×1
GLOVE BIOGEL PI INDICATOR 8 (GLOVE)
GLOVE ECLIPSE 7.5 STRL STRAW (GLOVE) ×2 IMPLANT
GLOVE SS BIOGEL STRL SZ 7.5 (GLOVE) IMPLANT
GLOVE SUPERSENSE BIOGEL SZ 7.5 (GLOVE) ×1
GLOVE SURG SS PI 6.5 STRL IVOR (GLOVE) ×1 IMPLANT
GOWN STRL NON-REIN LRG LVL3 (GOWN DISPOSABLE) ×6 IMPLANT
KIT BASIN OR (CUSTOM PROCEDURE TRAY) ×2 IMPLANT
KIT ROOM TURNOVER OR (KITS) ×2 IMPLANT
NS IRRIG 1000ML POUR BTL (IV SOLUTION) ×2 IMPLANT
PAD ARMBOARD 7.5X6 YLW CONV (MISCELLANEOUS) ×4 IMPLANT
PENCIL BUTTON HOLSTER BLD 10FT (ELECTRODE) ×2 IMPLANT
SHEARS HARMONIC 9CM CVD (BLADE) IMPLANT
SPONGE INTESTINAL PEANUT (DISPOSABLE) ×10 IMPLANT
STAPLER VISISTAT 35W (STAPLE) ×2 IMPLANT
SUT CHROMIC 4 0 PS 2 18 (SUTURE) IMPLANT
SUT ETHILON 3 0 PS 1 (SUTURE) ×1 IMPLANT
SUT ETHILON 5 0 P 3 18 (SUTURE)
SUT NYLON ETHILON 5-0 P-3 1X18 (SUTURE) IMPLANT
SUT PROLENE 5 0 P 3 (SUTURE) ×1 IMPLANT
SUT SILK 2 0 FS (SUTURE) ×3 IMPLANT
SUT SILK 3 0 (SUTURE) ×2
SUT SILK 3-0 18XBRD TIE 12 (SUTURE) ×1 IMPLANT
SUT SILK 4 0 TIES 17X18 (SUTURE) ×2 IMPLANT
SUT VICRYL 4-0 PS2 18IN ABS (SUTURE) ×1 IMPLANT
SYR 5ML LL (SYRINGE) IMPLANT
TOWEL OR 17X24 6PK STRL BLUE (TOWEL DISPOSABLE) ×2 IMPLANT
TOWEL OR 17X26 10 PK STRL BLUE (TOWEL DISPOSABLE) ×2 IMPLANT
TRAY ENT MC OR (CUSTOM PROCEDURE TRAY) ×2 IMPLANT
TRAY FOLEY CATH 14FRSI W/METER (CATHETERS) IMPLANT
WATER STERILE IRR 1000ML POUR (IV SOLUTION) ×2 IMPLANT

## 2012-10-28 NOTE — Brief Op Note (Signed)
10/28/2012  1:01 PM  PATIENT:  Orlondo Holycross  78 y.o. male  PRE-OPERATIVE DIAGNOSIS:  right parotid mass  POST-OPERATIVE DIAGNOSIS:  right parotid mass  PROCEDURE:  Procedure(s) (LRB) with comments: PAROTIDECTOMY (Right) - Total Parotidectomy  SURGEON:  Surgeon(s) and Role:    * Darletta Moll, MD - Primary  PHYSICIAN ASSISTANT:   ASSISTANTS: Roma Schanz, PA-C   ANESTHESIA:   general  EBL:  Total I/O In: 1450 [I.V.:1450] Out: 20 [Blood:20]  BLOOD ADMINISTERED:none  DRAINS: #10 JP drain in right parotid bed.  LOCAL MEDICATIONS USED:  LIDOCAINE   SPECIMEN:  Source of Specimen:  Right parotid mass and parotid lymph node.  DISPOSITION OF SPECIMEN:  PATHOLOGY  COUNTS:  YES  TOURNIQUET:  * No tourniquets in log *  DICTATION: .Other Dictation: Dictation Number [id number]  PLAN OF CARE: Admit for overnight observation  PATIENT DISPOSITION:  PACU - hemodynamically stable.   Delay start of Pharmacological VTE agent (>24hrs) due to surgical blood loss or risk of bleeding: no

## 2012-10-28 NOTE — Preoperative (Signed)
Beta Blockers   Reason not to administer Beta Blockers:Not Applicable 

## 2012-10-28 NOTE — Transfer of Care (Signed)
Immediate Anesthesia Transfer of Care Note  Patient: Fernando [name]  Procedure(s) Performed: Procedure(s) (LRB) with comments: PAROTIDECTOMY (Right) - Total Parotidectomy  Patient Location: PACU  Anesthesia Type:General  Level of Consciousness: awake, alert , oriented and patient cooperative  Airway & Oxygen Therapy: Patient Spontanous Breathing and Patient connected to nasal cannula oxygen  Post-op Assessment: Report given to PACU RN, Post -op Vital signs reviewed and stable and Patient moving all extremities  Post vital signs: Reviewed and stable  Complications: No apparent anesthesia complications

## 2012-10-28 NOTE — Anesthesia Preprocedure Evaluation (Addendum)
Anesthesia Evaluation  Patient identified by MRN, date of birth, ID band Patient awake    Reviewed: Allergy & Precautions, H&P , NPO status , Patient's Chart, lab work & pertinent test results, reviewed documented beta blocker date and time   History of Anesthesia Complications Negative for: history of anesthetic complications  Airway Mallampati: I TM Distance: >3 FB Neck ROM: Full    Dental  (+) Edentulous Upper and Edentulous Lower   Pulmonary sleep apnea ,  breath sounds clear to auscultation  Pulmonary exam normal       Cardiovascular hypertension, Pt. on home beta blockers and Pt. on medications + Peripheral Vascular Disease (s/p CEA) + dysrhythmias Atrial Fibrillation + pacemaker (for SSS/ tachy-brady) Rhythm:Regular Rate:Normal  10/13 ECHO: mild LVH, EF 55%, afib, valves OK   Neuro/Psych S/p CEA    GI/Hepatic negative GI ROS, Neg liver ROS,   Endo/Other  negative endocrine ROS  Renal/GU negative Renal ROS     Musculoskeletal   Abdominal   Peds  Hematology   Anesthesia Other Findings   Reproductive/Obstetrics                          Anesthesia Physical Anesthesia Plan  ASA: III  Anesthesia Plan: General   Post-op Pain Management:    Induction: Intravenous  Airway Management Planned: Oral ETT  Additional Equipment:   Intra-op Plan:   Post-operative Plan: Extubation in OR  Informed Consent: I have reviewed the patients History and Physical, chart, labs and discussed the procedure including the risks, benefits and alternatives for the proposed anesthesia with the patient or authorized representative who has indicated his/her understanding and acceptance.   Dental advisory given  Plan Discussed with: CRNA and Surgeon  Anesthesia Plan Comments: (Plan routine monitors, GETA Magnet over pacemaker, as per Dr. Alric Quan)        Anesthesia Quick Evaluation

## 2012-10-28 NOTE — Anesthesia Postprocedure Evaluation (Signed)
  Anesthesia Post-op Note  Patient: Fernando [name]  Procedure(s) Performed: Procedure(s) (LRB) with comments: PAROTIDECTOMY (Right) - Total Parotidectomy  Patient Location: PACU  Anesthesia Type:General  Level of Consciousness: awake, alert , oriented and patient cooperative  Airway and Oxygen Therapy: Patient Spontanous Breathing  Post-op Pain: mild  Post-op Assessment: Post-op Vital signs reviewed, Patient's Cardiovascular Status Stable, Respiratory Function Stable, Patent Airway, No signs of Nausea or vomiting and Pain level controlled  Post-op Vital Signs: Reviewed and stable  Complications: No apparent anesthesia complications

## 2012-10-28 NOTE — Op Note (Signed)
NAMEYAZEED, PRYER NO.:  [id number]  MEDICAL RECORD NO.:  [id number]  LOCATION:  6N11C                        FACILITY:  [facility]  PHYSICIAN:  Newman Pies, MD            DATE OF BIRTH:  [birth year]  DATE OF PROCEDURE:  10/28/2012 DATE OF DISCHARGE:                              OPERATIVE REPORT   SURGEON:  Newman Pies, MD  PREOPERATIVE DIAGNOSIS:  Right parotid mass.  POSTOPERATIVE DIAGNOSIS:  Right parotid mass.  PROCEDURE PERFORMED:  Right total parotidectomy.  ANESTHESIA:  General endotracheal tube anesthesia.  COMPLICATIONS:  None.  ESTIMATED BLOOD LOSS:  20 mL.  SURGICAL ASSISTANT:  Roma Schanz, PA-C  INDICATION FOR PROCEDURE:  The patient is an 78 year old male with a history of right parotid mass.  He previously underwent fine-needle aspiration of the mass; however, the results were inconclusive.  He subsequently underwent a CT scan of his parotid gland in September.  The CT showed 1.7-cm enhancing mass at the superficial lobe of the right parotid gland.  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 However, malignancy could not be excluded.  The patient was scheduled to undergo right superficial parotidectomy in October of this year. However, the patient was noted to have irregular heart beat, with atrial fibrillation and heart rates ranging from 40s-200s.  As a result, the surgery was canceled.  The patient was subsequently seen by his cardiologist.  A pacemaker was placed.  Once his medical condition was stabilized, the decision was made for the patient to undergo the right parotidectomy procedure.  It should be noted that over the past 2 months, the size of the parotid mass has significantly enlarged.  At the time of the surgery today, the mass was noted to be approximately 4 cm in size.  Intraoperatively, the mass was also noted to extend deep through the facial nerve, encasing the buccal branch of the facial nerve.  As a result, total  parotidectomy procedure was performed, with the entire parotid mass removed, including the portion deep to the facial nerve.  The risks, benefits, alternatives, and details of the procedure were reviewed with the patient prior to the surgery. Questions were invited and answered.  Informed consent was obtained.  DESCRIPTION:  The patient was taken to the operating room and placed in supine on the operating table.  General endotracheal tube anesthesia was administered by the anesthesiologist.  Preop IV antibiotics was given. The patient was positioned and prepped and draped in a standard fashion for full right parotidectomy surgery.  A 1% lidocaine with 1:100,000 epinephrine was injected at the planned site of incisions.  Facial nerve monitoring electrodes were placed.  The facial nerve monitoring system was functional throughout the case.  A facelift incision was made on the right side.  The SMAS flap was elevated in a standard fashion.  An approximately 4-cm parotid mass was noted at the superior portion of the parotid gland.  The incision was carried down along the preauricular cartilage.  The main trunk of the facial nerve was identified.  A careful dissection was then performed to dissect out all five branches of the facial nerve.  At this time, it  was noted that the parotid mass has extended deep to the plane of the facial nerve.  Specifically, the gland was noted to be completely encasing the buccal branch of the facial nerve.  Careful dissection was carried out to dissect free the buccal branch of the facial nerve from the tumor.  As a result, the tumor was opened.  All five branches of the facial nerves were preserved.  The entire parotid tumor was removed and sent to the Pathology Department for permanent histologic identification.  It should also be noted that there was an enlarged lymph node deep to the parotid mass.  The lymph node was also removed and sent to the  Pathology Department for histologic identification.  The surgical sites were copiously irrigated.  A #10 JP drain was placed.  The surgical site was again irrigated.  The incision was closed in layers with 4-0 Vicryl, 5-0 Prolene, and Dermabond.  The care of the patient was turned over to the anesthesiologist.  The patient was awakened from anesthesia without difficulty.  He was extubated and transferred to the recovery room in good condition.  OPERATIVE FINDINGS:  A 4-cm right superior parotid mass was noted.  The mass was noted to extend deep to the facial nerve.  The entire mass was removed.  Another enlarged lymph node was also removed deep to the parotid mass.  SPECIMENS:  Right parotid mass and lymph node.  The patient will be observed overnight in the hospital.  He will most likely be discharged home on postop day #1.     Newman Pies, MD     ST/MEDQ  D:  10/28/2012  T:  10/28/2012  Job:  [id number]  cc:   Barbaraann Barthel, M.D.

## 2012-10-28 NOTE — H&P (Signed)
Cc: Right parotid mass  HPI: The patient is an 78 year old male who returns today with his son.  The patient was last seen two weeks ago. At that time, he was noted to have a right parotid mass.  He underwent fine needle aspiration of the mass.  However, the results were inconclusive.  He subsequently underwent a CT scan of his parotid gland.  The CT scan shows a 1.7 cm enhancing mass at the superficial lobe of the right parotid gland.  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  However, malignancy could not be excluded.  The patient returns and reports no significant change in his right parotid mass.  He denies any significant pain, skin erythema, or drainage.  He also denies any facial weakness. No other ENT, GI, or resipratory issue noted since the last visit.  Past Medical History (Major events, hospitalizations, surgeries):  None.       Known allergies: NKDA.       Ongoing medical problems: Hyerlipidemia, Carotid artery occlusion.       Family medical history: Heart Disease.       Social history: The patient is a widow. He does drinks beer once per week. He was a former tobacco user which quit in 1974. He denies the use of illegal drugs.  Exam: General: Communicates without difficulty, well nourished, no acute distress.   Head: Normocephalic, no evidence injury, no tenderness, facial buttresses intact without stepoff.   Eyes: No scleral icterus, conjunctivae clear.   Neuro: CN II exam reveals vision grossly intact.   No nystagmus at any point of gaze.   Ears: Auricles well formed without lesions.   Ear canals are intact without mass or lesion.   No erythema or edema is appreciated.   The TM is intact without fluid.   Nose: External evaluation reveals normal support and skin without lesions.   Dorsum is intact.   Anterior rhinoscopy reveals healthy pink mucosa over anterior aspect of inferior turbinates and intact septum.   No purulence noted.   Oral:  Oral cavity and oropharynx are  intact, symmetric, without erythema or edema.   Mucosa is moist without lesions.   Salivary: Right parotid gland with 2cm mass.   Submandibular gland without mass.   Neck: Full range of motion without pain.   There is no significant lymphadenopathy.   No masses palpable.   Thyroid bed within normal limits to palpation.   Trachea is midline.   Facial nerve function intact bilaterally.  A: An enhancing right parotid mass.  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  No facial nerve weakness is noted at this time.  P: 1.  Based on the history and physical exam findings, it is recommended the patient should have the right parotid mass removed.  The procedure would involve right superficial parotidectomy with facial nerve dissection.  The risks, benefits, alternatives, and details of the procedure are discussed with the patient and his son.  2.  The patient would like to proceed with the procedure.

## 2012-10-28 NOTE — Anesthesia Procedure Notes (Signed)
Procedure Name: Intubation Date/Time: 10/28/2012 10:14 AM Performed by: Jerilee Hoh Pre-anesthesia Checklist: Patient identified, Emergency Drugs available, Suction available and Patient being monitored Patient Re-evaluated:Patient Re-evaluated prior to inductionOxygen Delivery Method: Circle system utilized Preoxygenation: Pre-oxygenation with 100% oxygen Intubation Type: IV induction Laryngoscope Size: Mac and 4 Grade View: Grade I Tube type: Oral Tube size: 7.5 mm Number of attempts: 1 Airway Equipment and Method: Stylet and LTA kit utilized Placement Confirmation: ETT inserted through vocal cords under direct vision,  positive ETCO2 and breath sounds checked- equal and bilateral Secured at: 22 cm Tube secured with: Tape Dental Injury: Teeth and Oropharynx as per pre-operative assessment

## 2012-10-29 ENCOUNTER — Encounter (HOSPITAL_COMMUNITY): Payer: Self-pay | Admitting: Otolaryngology

## 2012-10-29 MED ORDER — AMOXICILLIN-POT CLAVULANATE 875-125 MG PO TABS: 1.0000 | ORAL_TABLET | Freq: Two times a day (BID) | ORAL | Status: AC

## 2012-10-29 MED ORDER — OXYCODONE-ACETAMINOPHEN 5-325 MG PO TABS: 1.0000 | ORAL_TABLET | Freq: Four times a day (QID) | ORAL | Status: DC | PRN

## 2012-10-29 NOTE — Discharge Summary (Signed)
Physician Discharge Summary  Patient ID: Fernando [name] MRN: [id number] DOB/AGE: 1927/10/17 78 y.o.  Admit date: 10/28/2012 Discharge date: 10/29/2012  Admission Diagnoses: s/p right parotidectomy  Discharge Diagnoses:  s/p right parotidectomy Active Problems:  * No active hospital problems. *    Discharged Condition: good  Hospital Course: The patient has an uneventful recovery overnight.  His incision is c/d/i. Slight edema is noted at the inferior parotid bed. JP drain was removed on POD #1.  He is tolerating po well. Facial nerve is intact bilaterally.  Consults: None  Significant Diagnostic Studies: none  Treatments: surgery: Right parotidectomy  Discharge Exam: Blood pressure 136/74, pulse 70, temperature 98.1 F (36.7 C), temperature source Oral, resp. rate 18, height 5\' 6"  (1.676 m), weight 82.827 kg (182 lb 9.6 oz), SpO2 99.00%.  Disposition: 01-Home or Self Care  Discharge Orders    Future Appointments: Provider: Department: Dept Phone: Center:   01/11/2013 8:30 AM Marinus Maw, MD [facility] [place] [phone] [facility]   03/19/2013 9:00 AM [facility] [phone] [facility]   03/19/2013 10:00 AM Evern Bio, NP [facility] [phone] [facility]       Medication List     As of 10/29/2012  7:56 AM    STOP taking these medications         aspirin EC 81 MG tablet      TAKE these medications         ALEVE 220 MG tablet   Generic drug: naproxen sodium   Take 440 mg by mouth every 12 (twelve) hours as needed. For headaches      amoxicillin-clavulanate 875-125 MG per tablet   Commonly known as: AUGMENTIN   Take 1 tablet by mouth 2 (two) times daily.      atorvastatin 40 MG tablet   Commonly known as: LIPITOR   Take 40 mg by mouth every morning.      Glucosamine HCl 1500 MG Tabs   Take 1,500 mg by mouth every morning.      losartan 50 MG tablet   Commonly known as:  COZAAR   Take 50 mg by mouth every morning.      metoprolol tartrate 25 MG tablet   Commonly known as: LOPRESSOR   Take 25 mg by mouth 2 (two) times daily.      oxyCODONE-acetaminophen 5-325 MG per tablet   Commonly known as: PERCOCET/ROXICET   Take 1-2 tablets by mouth every 6 (six) hours as needed for pain.           Follow-up Information    Follow up with DR Bruce Churilla [facility]. In 1 week. (as scheduled)    Contact information:   [address] [place] [address] [place] [address]          Signed: Darletta Moll 10/29/2012, 7:56 AM

## 2012-10-29 NOTE — Progress Notes (Signed)
Patient discharged to home with instructions. 

## 2012-11-05 ENCOUNTER — Ambulatory Visit (INDEPENDENT_AMBULATORY_CARE_PROVIDER_SITE_OTHER): Payer: Medicare Other | Admitting: Otolaryngology

## 2012-11-05 DIAGNOSIS — C07 Malignant neoplasm of parotid gland: Secondary | ICD-10-CM

## 2012-11-11 ENCOUNTER — Encounter (HOSPITAL_COMMUNITY): Payer: Medicare Other | Attending: Oncology | Admitting: Oncology

## 2012-11-11 ENCOUNTER — Encounter (HOSPITAL_COMMUNITY): Payer: Self-pay | Admitting: Oncology

## 2012-11-11 VITALS — BP 127/77 | HR 88 | Temp 96.9°F | Resp 18 | Ht 64.0 in | Wt 174.0 lb

## 2012-11-11 DIAGNOSIS — Z23 Encounter for immunization: Secondary | ICD-10-CM

## 2012-11-11 DIAGNOSIS — I10 Essential (primary) hypertension: Secondary | ICD-10-CM | POA: Insufficient documentation

## 2012-11-11 DIAGNOSIS — I4891 Unspecified atrial fibrillation: Secondary | ICD-10-CM | POA: Insufficient documentation

## 2012-11-11 DIAGNOSIS — F1010 Alcohol abuse, uncomplicated: Secondary | ICD-10-CM | POA: Insufficient documentation

## 2012-11-11 DIAGNOSIS — I6529 Occlusion and stenosis of unspecified carotid artery: Secondary | ICD-10-CM | POA: Insufficient documentation

## 2012-11-11 DIAGNOSIS — C07 Malignant neoplasm of parotid gland: Secondary | ICD-10-CM | POA: Insufficient documentation

## 2012-11-11 LAB — CBC WITH DIFFERENTIAL/PLATELET
Eosinophils Absolute: 0.3 10*3/uL (ref 0.0–0.7)
Eosinophils Relative: 4 % (ref 0–5)
HCT: 47.9 % (ref 39.0–52.0)
Hemoglobin: 15.8 g/dL (ref 13.0–17.0)
Lymphocytes Relative: 27 % (ref 12–46)
Lymphs Abs: 2.1 10*3/uL (ref 0.7–4.0)
MCH: 30.3 pg (ref 26.0–34.0)
MCV: 91.9 fL (ref 78.0–100.0)
Monocytes Absolute: 0.8 10*3/uL (ref 0.1–1.0)
Monocytes Relative: 11 % (ref 3–12)
Platelets: 277 10*3/uL (ref 150–400)
RBC: 5.21 MIL/uL (ref 4.22–5.81)

## 2012-11-11 LAB — COMPREHENSIVE METABOLIC PANEL
BUN: 30 mg/dL — ABNORMAL HIGH (ref 6–23)
CO2: 26 mEq/L (ref 19–32)
Calcium: 9.1 mg/dL (ref 8.4–10.5)
Creatinine, Ser: 1.06 mg/dL (ref 0.50–1.35)
GFR calc Af Amer: 73 mL/min — ABNORMAL LOW (ref >90)
GFR calc non Af Amer: 63 mL/min — ABNORMAL LOW (ref >90)
Glucose, Bld: 102 mg/dL — ABNORMAL HIGH (ref 70–99)
Total Protein: 7.8 g/dL (ref 6.0–8.3)

## 2012-11-11 MED ORDER — INFLUENZA VIRUS VACC SPLIT PF IM SUSP
0.5000 mL | INTRAMUSCULAR | Status: AC
  Administered 2012-11-11: 0.5 mL via INTRAMUSCULAR

## 2012-11-11 MED ORDER — INFLUENZA VIRUS VACC SPLIT PF IM SUSP
INTRAMUSCULAR | Status: AC
  Filled 2012-11-11: qty 0.5

## 2012-11-11 NOTE — Progress Notes (Signed)
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 This mass was initially felt to be 1.7 cm at the time of presentation but had enlarged significantly to 4 cm clinically at the time of surgery. Pathologically it was 3.4 cm. A lymph node deep to the parotid was enlarged clinically at the time of surgery but was negative pathologically. Along a broad margin, there was less than 1 mm of tumor to border. He was referred here for consultation. He is accompanied by [name] and his granddaughter Fernando [name]. He has one son in good health. He was one of 9 children. He was the youngest. Problem #2 atrial fibrillation requiring pacemaker insertion recently by Dr. Ladona Ridgel Problem #3 hypertension Problem #4 excessive alcohol use Problem #5 carotid artery stenosis, asymptomatic Problem #6 bilateral cataract operations with lens implants Problem #7 carotid endarterectomy in the past Problem #8 severe difficulty hearing though he refuses to wear hearing Very pleasant 78 year old gentleman who noted a knot on the right side of his face for proximal a 3 months prior to a CAT scan on 08/17/2012. He was very suspicious for malignancy realistically. He was referred to Dr. Suszanne Conners. Initial biopsy was nondiagnostic. He was initially to have surgery 09/10/1999 and that had to be canceled due to 2 atrial fibrillation been found at that time. He then had the above-mentioned pacemaker inserted. He then went to definitive surgery on 10/28/2012 after significant growth clinically of this tumor. The pathology returned poorly differentiated squamous cell carcinoma. He states he does not smoke but he has drank excessively for many years according to his daughter though he is not bring that issue up.  He has not lost weight that we are aware of. He is not having fevers or chills. He has not had trouble swallowing. 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 He served in the [employer] between World War II and the [place] War. His wife is deceased from cancer several years ago. He remains very close to his daughter and granddaughter. 3 brothers and 2 sisters are deceased. One brother had a brain cancer, another brother died in a car wreck. Another brother died of cancer but he does not know the type.  BP 127/77  Pulse 88  Temp 96.9 F (36.1 C) (Oral)  Resp 18  Ht 5\' 4"  (1.626 m)  Wt 174 lb (78.926 kg)  BMI 29.87 kg/m2  Pleasant but very hard of hearing. He has a small fluid collection approximate 2 cm across anterior to the right year in the incision area. He has no palpable lymphadenopathy in the cervical, supraclavicular, infraclavicular, axillary, or inguinal areas. Lungs are clear. Heart shows an irregular rhythm but normal rate. I did not appreciate a murmur rub or gallop. Abdomen was soft and nontender without again in [place]. Pacemaker in place in the left upper chest wall. Skin exam unremarkable. He has upper and lower dental plates. I show changes of prior cataract operations. He is alert and oriented. His weakness of the right inferior eyelid and right middle facial muscles and right lower facial muscles. He follows all commands. He has no leg edema no arm edema. There is no obvious mass on the right side of his face presently We're therefore dealing with a very high-grade squamous cell carcinoma the parotid gland which most likely has residual disease left behind. With his rapidly growing cancer I will get a PET scan  and radiation therapy consult Dr. Thersa Salt as soon as possible. His prognosis I think is still very guarded. It is possible that Dr. Thersa Salt would want to consider chemoradiation if he does not have metastatic disease. I am concerned about his alcohol use after discussing this with his daughter outside the room. I've address this issue with her. She will let me know how the discussion goes  that she will have with him.

## 2012-11-11 NOTE — Progress Notes (Signed)
[name] presented for labwork. Labs per MD order drawn via Peripheral Line 23 gauge needle inserted in right Ac  Good blood return present. Procedure without incident.  Needle removed intact. Patient tolerated procedure well.  Fernando [name] presents today for injection per MD orders. Flu vaccine administered IM in right Upper Arm. Administration without incident. Patient tolerated well.

## 2012-11-11 NOTE — Patient Instructions (Addendum)
[facility] Discharge Instructions  RECOMMENDATIONS MADE BY THE CONSULTANT AND ANY TEST RESULTS WILL BE SENT TO YOUR REFERRING PHYSICIAN.  EXAM FINDINGS BY THE PHYSICIAN TODAY AND SIGNS OR SYMPTOMS TO REPORT TO CLINIC OR PRIMARY PHYSICIAN: Exam and discussion by MD.  We will get a PET scan on 11/17/12 and a radiation consultation with Dr. Thersa Salt at [facility].  We will call you with the date and time of the appointment.  MEDICATIONS PRESCRIBED:  none  INSTRUCTIONS GIVEN AND DISCUSSED: PET scan - nothing to eat or drink 6 hours prior to the scan.  No chewing gum, mints, candy - nothing with sugar in it.  SPECIAL INSTRUCTIONS/FOLLOW-UP: Return in 2 weeks to see PA - Dellis Anes.  Thank you for choosing [facility] to provide your oncology and hematology care.  To afford each patient quality time with our providers, please arrive at least 15 minutes before your scheduled appointment time.  With your help, our goal is to use those 15 minutes to complete the necessary work-up to ensure our physicians have the information they need to help with your evaluation and healthcare recommendations.    Effective January 1st, 2014, we ask that you re-schedule your appointment with our physicians should you arrive 10 or more minutes late for your appointment.  We strive to give you quality time with our providers, and arriving late affects you and other patients whose appointments are after yours.    Again, thank you for choosing [facility].  Our hope is that these requests will decrease the amount of time that you wait before being seen by our physicians.       _____________________________________________________________  I acknowledge that I have been informed and understand all the instructions given to me and received a copy. I do not have anymore questions at this time but understand that I may call the Cancer Center at [facility]  at [phone] during business hours should I have any further questions or need assistance in obtaining follow-up care.    __________________________________________  _____________  __________ Signature of Patient or Authorized Representative            Date                   Time    __________________________________________ Nurse's Signature

## 2012-11-12 ENCOUNTER — Ambulatory Visit (INDEPENDENT_AMBULATORY_CARE_PROVIDER_SITE_OTHER): Payer: Medicare Other | Admitting: Otolaryngology

## 2012-11-12 LAB — CEA: CEA: 2.8 ng/mL (ref 0.0–5.0)

## 2012-11-16 ENCOUNTER — Telehealth: Payer: Self-pay | Admitting: Internal Medicine

## 2012-11-16 NOTE — Telephone Encounter (Signed)
Error / tgs  °

## 2012-11-17 ENCOUNTER — Encounter (HOSPITAL_COMMUNITY): Payer: Self-pay

## 2012-11-17 ENCOUNTER — Encounter (HOSPITAL_COMMUNITY)
Admission: RE | Admit: 2012-11-17 | Discharge: 2012-11-17 | Disposition: A | Discharge status: Home / Self Care | Payer: Medicare Other | Source: Ambulatory Visit | Attending: Oncology | Admitting: Oncology

## 2012-11-17 DIAGNOSIS — C07 Malignant neoplasm of parotid gland: Secondary | ICD-10-CM | POA: Insufficient documentation

## 2012-11-17 LAB — GLUCOSE, CAPILLARY: Glucose-Capillary: 101 mg/dL — ABNORMAL HIGH (ref 70–99)

## 2012-11-17 MED ORDER — FLUDEOXYGLUCOSE F - 18 (FDG) INJECTION
19.5000 | Freq: Once | INTRAVENOUS | Status: AC | PRN
  Administered 2012-11-17: 19.5 via INTRAVENOUS

## 2012-11-24 ENCOUNTER — Telehealth (HOSPITAL_COMMUNITY): Payer: Self-pay

## 2012-11-24 NOTE — Telephone Encounter (Signed)
Call from daughter wanting to know if Fernando [name] still needs to proceed with radiation appointment on 11/26/12?

## 2012-11-24 NOTE — Telephone Encounter (Signed)
Message left on daughter's voicemail that per Dr. Mariel Sleet patient does not need to be seen on 11/26/12 and that appointment was rescheduled for 01/19/13.  Call back confirmation requested.

## 2012-11-26 ENCOUNTER — Other Ambulatory Visit (HOSPITAL_COMMUNITY): Payer: Medicare Other

## 2012-11-26 ENCOUNTER — Telehealth: Payer: Self-pay | Admitting: Internal Medicine

## 2012-11-26 ENCOUNTER — Ambulatory Visit (HOSPITAL_COMMUNITY): Payer: Medicare Other | Admitting: Oncology

## 2012-11-26 NOTE — Telephone Encounter (Signed)
Patient's daughter states that she was told to call back to discuss blood thinner with Dr.Taylor.  Please route this Dr.Taylor since he will not be back in our office until Monday 11/30/12. / tgs

## 2012-11-26 NOTE — Telephone Encounter (Signed)
Discussed with Dr Ladona Ridgel, will discuss anticoagulation at follow up in 12/2012

## 2012-12-02 ENCOUNTER — Ambulatory Visit: Admit: 2012-12-02 | Payer: Self-pay | Admitting: Otolaryngology

## 2012-12-02 SURGERY — EXCISION, PAROTID GLAND
Anesthesia: General | Laterality: Right

## 2013-01-11 ENCOUNTER — Encounter: Payer: Self-pay | Admitting: Internal Medicine

## 2013-01-11 ENCOUNTER — Ambulatory Visit (INDEPENDENT_AMBULATORY_CARE_PROVIDER_SITE_OTHER): Payer: Medicare Other | Admitting: Internal Medicine

## 2013-01-11 VITALS — BP 106/60 | HR 76 | Ht 65.0 in | Wt 157.0 lb

## 2013-01-11 DIAGNOSIS — I4891 Unspecified atrial fibrillation: Secondary | ICD-10-CM

## 2013-01-11 DIAGNOSIS — I495 Sick sinus syndrome: Secondary | ICD-10-CM

## 2013-01-11 DIAGNOSIS — I10 Essential (primary) hypertension: Secondary | ICD-10-CM

## 2013-01-11 DIAGNOSIS — Z950 Presence of cardiac pacemaker: Secondary | ICD-10-CM

## 2013-01-11 LAB — PACEMAKER DEVICE OBSERVATION
AL IMPEDENCE PM: 487.5 Ohm
ATRIAL PACING PM: 1
BAMS-0001: 150 {beats}/min
RV LEAD AMPLITUDE: 12 mv
RV LEAD IMPEDENCE PM: 525 Ohm
RV LEAD THRESHOLD: 0.5 V

## 2013-01-11 MED ORDER — RIVAROXABAN 20 MG PO TABS: 20.0000 mg | ORAL_TABLET | Freq: Every day | ORAL | Status: DC

## 2013-01-11 NOTE — Patient Instructions (Addendum)
Your physician recommends that you schedule a follow-up appointment in: November WITH [facility]  Your physician has recommended you make the following change in your medication:   1) START XARELTO 20MG  ONE TABLET DAILY

## 2013-01-11 NOTE — Addendum Note (Signed)
Addended by: Derry Lory A on: 01/11/2013 09:34 AM   Modules accepted: Orders

## 2013-01-11 NOTE — Assessment & Plan Note (Signed)
The patient appears to be persistently in atrial fibrillation since insertion of his pacemaker. He is asymptomatic. He is not on anticoagulation. I've discussed the treatment options with the patient and his daughter, the pros and cons of warfarin as well as the novel agents have been discussed. We will initiate Xarelto.

## 2013-01-11 NOTE — Progress Notes (Signed)
HPI [name] returns today for followup. He is a very pleasant 79 year old man with symptomatic tachybradycardia syndrome, persitent atrial fibrillation, and hypertension. He is status post insertion of a St. Jude dual-chamber pacemaker approximately 3 months ago. In the interim, he has been given radiation therapy for a salivary gland tumor. He notes reduced appetite and oral intake. He denies palpitations, chest pain, or shortness of breath. No syncope. We initially held off on anticoagulation for this patient out of concerns about his radiation therapy. He has been in persistent atrial fibrillation and his stroke risk is moderate. No Known Allergies   Current Outpatient Prescriptions  Medication Sig Dispense Refill  . atorvastatin (LIPITOR) 40 MG tablet Take 40 mg by mouth every morning.       . Glucosamine HCl 1500 MG TABS Take 1,500 mg by mouth every morning.      Marland Kitchen losartan (COZAAR) 50 MG tablet Take 50 mg by mouth every morning.       . metoprolol tartrate (LOPRESSOR) 25 MG tablet Take 25 mg by mouth 2 (two) times daily.      . naproxen sodium (ALEVE) 220 MG tablet Take 440 mg by mouth every 12 (twelve) hours as needed. For headaches      . oxyCODONE-acetaminophen (PERCOCET/ROXICET) 5-325 MG per tablet Take 1-2 tablets by mouth every 6 (six) hours as needed for pain.  30 tablet  0   No current facility-administered medications for this visit.     Past Medical History  Diagnosis Date  . Carotid artery disease     Dr. Hart [place] - s/p left CEA  . Hyperlipidemia   . Essential hypertension, benign   . Arthritis   . Atrial fibrillation   . Tachycardia-bradycardia syndrome     ROS:   All systems reviewed and negative except as noted in the HPI.   Past Surgical History  Procedure Laterality Date  . Carotid endarterectomy    . Cataract extraction w/ intraocular lens implant      Bilateral  . Pacemaker insertion  10-01-2012  . Insert / replace / remove pacemaker    .  Parathyroidectomy  10/28/2012  . Parotidectomy  10/28/2012    Procedure: PAROTIDECTOMY;  Surgeon: Darletta Moll, MD;  Location: [facility] OR;  Service: ENT;  Laterality: Right;  Total Parotidectomy     Family History  Problem Relation Age of Onset  . Heart disease Sister   . Heart attack Sister   . Cirrhosis Mother   . Cancer Brother     brain cancer  . Cancer Sister     stomach cancer     History   Social History  . Marital Status: Widowed    Spouse Name: N/A    Number of Children: N/A  . Years of Education: N/A   Occupational History  . Not on file.   Social History Main Topics  . Smoking status: Former Smoker    Types: Cigarettes    Quit date: 11/25/1972  . Smokeless tobacco: Not on file  . Alcohol Use: 0.6 oz/week    1 Cans of beer per week     Comment: daily  . Drug Use: No  . Sexually Active: No   Other Topics Concern  . Not on file   Social History Narrative  . No narrative on file     BP 106/60  Pulse 76  Ht 5\' 5"  (1.651 m)  Wt 157 lb (71.215 kg)  BMI 26.13 kg/m2  Physical Exam:  Well appearing, elderly  man, NAD HEENT: Unremarkable Neck:  7 cm JVD, no thyromegally Lungs:  Clearwith no wheezes, rales, or rhonchi. HEART:  Regular rate rhythm, no murmurs, no rubs, no clicks Abd:  soft, positive bowel sounds, no organomegally, no rebound, no guarding Ext:  2 plus pulses, no edema, no cyanosis, no clubbing Skin:  No rashes no nodules Neuro:  CN II through XII intact, motor grossly intact  DEVICE  Normal device function.  See PaceArt for details.   Assess/Plan:

## 2013-01-11 NOTE — Assessment & Plan Note (Signed)
His blood pressure is well controlled. No change in medical therapy. 

## 2013-01-11 NOTE — Assessment & Plan Note (Signed)
Interrogation of his St. Jude dual-chamber pacemaker demonstrates normal function. We'll recheck in several months.

## 2013-01-19 ENCOUNTER — Encounter (HOSPITAL_COMMUNITY): Payer: Self-pay | Admitting: Oncology

## 2013-01-19 ENCOUNTER — Encounter (HOSPITAL_COMMUNITY): Payer: Medicare Other | Attending: Oncology | Admitting: Oncology

## 2013-01-19 VITALS — BP 119/83 | HR 71 | Temp 97.7°F | Resp 16 | Wt 163.4 lb

## 2013-01-19 DIAGNOSIS — B370 Candidal stomatitis: Secondary | ICD-10-CM | POA: Insufficient documentation

## 2013-01-19 DIAGNOSIS — F1010 Alcohol abuse, uncomplicated: Secondary | ICD-10-CM

## 2013-01-19 DIAGNOSIS — I4891 Unspecified atrial fibrillation: Secondary | ICD-10-CM

## 2013-01-19 DIAGNOSIS — C07 Malignant neoplasm of parotid gland: Secondary | ICD-10-CM

## 2013-01-19 HISTORY — DX: Malignant neoplasm of parotid gland: C07

## 2013-01-19 MED ORDER — FIRST-DUKES MOUTHWASH MT SUSP: 5.0000 mL | Freq: Four times a day (QID) | OROMUCOSAL | Status: DC | PRN

## 2013-01-19 NOTE — Progress Notes (Signed)
Fernando Reichert, MD [address] [place] [address]  Squamous cell carcinoma of parotid - Plan: ARIPiprazole (ABILIFY) 20 MG tablet, CBC with Differential, Comprehensive metabolic panel, CT Soft Tissue Neck W Contrast  CURRENT THERAPY: Observation/Surveillance  INTERVAL HISTORY: Fernando [name] 79 y.o. male returns for  regular  visit for followup of Poorly differentiated squamous cell carcinoma the right parotid gland with positive margins status post resection on 10/28/2012. This mass was initially felt to be 1.7 cm at the time of presentation but had enlarged significantly to 4 cm clinically at the time of surgery. Pathologically it was 3.4 cm. A lymph node deep to the parotid was enlarged clinically at the time of surgery but was negative pathologically. Along a broad margin, there was less than 1 mm of tumor to border. S/P Radiation therapy by Dr. Thersa [name] finishing on 01/11/2013.  Fernando [name] completed his course of definitive radiation by Dr. Thersa [name] on 01/11/2013.  He tolerated it well with some difficulty with change in taste and weight loss.  He did have some burning of the skin, but it looks healed today with some erythema. He denies any dysphagia today.  His taste is improving for food, but is not back to 100%  He denies any complaints today.    I discussed the future plan of performing a new baseline CT of neck about 8 weeks out from radiation.  He completed radiation 1 week ago.  So we will set that up and see him back at that time.   Oncologically, he denies any complaints and ROS questioning is negative.   Past Medical History  Diagnosis Date  . Carotid artery disease     Dr. Hart [name] - s/p left CEA  . Hyperlipidemia   . Essential hypertension, benign   . Arthritis   . Atrial fibrillation   . Tachycardia-bradycardia syndrome   . 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 This mass was initially felt to be 1.7 cm at the time of presentation but had enlarged significantly to 4 cm clinically at the time of surgery. Pathologically it was 3.4 cm. A lymph node deep to the parotid was enlarged clinically at the time of surgery but was negative     has Carotid artery stenosis, asymptomatic; Atrial fibrillation; Essential hypertension, benign; Tachy-brady syndrome; Pacemaker-St.Jude; and Squamous cell carcinoma of parotid on his problem list.     has No Known Allergies.  Fernando [name] does not currently have medications on file.  Past Surgical History  Procedure Laterality Date  . Carotid endarterectomy    . Cataract extraction w/ intraocular lens implant      Bilateral  . Pacemaker insertion  10-01-2012  . Insert / replace / remove pacemaker    . Parathyroidectomy  10/28/2012  . Parotidectomy  10/28/2012    Procedure: PAROTIDECTOMY;  Surgeon: Fernando Moll, MD;  Location: [facility] OR;  Service: ENT;  Laterality: Right;  Total Parotidectomy    Denies any headaches, dizziness, double vision, fevers, chills, night sweats, nausea, vomiting, diarrhea, constipation, chest pain, heart palpitations, shortness of breath, blood in stool, black tarry stool, urinary pain, urinary burning, urinary frequency, hematuria.   PHYSICAL EXAMINATION  ECOG PERFORMANCE STATUS: 2 - Symptomatic, <50% confined to bed  Filed Vitals:   01/19/13 1400  BP: 119/83  Pulse: 71  Temp: 97.7 F (36.5 C)  Resp: 16    GENERAL:alert, no distress,  comfortable, cooperative and smiling, hard of hearing SKIN: skin color, texture, turgor are normal, no rashes or significant lesions HEAD: Normocephalic, No masses, lesions, tenderness or abnormalities EYES: normal, Conjunctiva are pink and non-injected EARS: External ears normal OROPHARYNX:mucous membranes are moist  NECK: supple, no adenopathy, thyroid normal size, non-tender, without nodularity, no stridor, non-tender,  trachea midline, erythema of right neck with some thickening, but no distinct adenopathy. LYMPH:  no palpable lymphadenopathy, no hepatosplenomegaly BREAST:not examined LUNGS: clear to auscultation and percussion HEART: regular rate & rhythm, no murmurs, no gallops, S1 normal and S2 normal ABDOMEN:abdomen soft, non-tender, obese, normal bowel sounds, no masses or organomegaly and no hepatosplenomegaly BACK: Back symmetric, no curvature., No CVA tenderness EXTREMITIES:less then 2 second capillary refill, no joint deformities, effusion, or inflammation, no edema, no skin discoloration, no clubbing, no cyanosis  NEURO: alert & oriented x 3 with fluent speech, no focal motor/sensory deficits, gait normal    LABORATORY DATA: CBC    Component Value Date/Time   WBC 7.7 11/11/2012 1745   RBC 5.21 11/11/2012 1745   HGB 15.8 11/11/2012 1745   HCT 47.9 11/11/2012 1745   PLT 277 11/11/2012 1745   MCV 91.9 11/11/2012 1745   MCH 30.3 11/11/2012 1745   MCHC 33.0 11/11/2012 1745   RDW 13.4 11/11/2012 1745   LYMPHSABS 2.1 11/11/2012 1745   MONOABS 0.8 11/11/2012 1745   EOSABS 0.3 11/11/2012 1745   BASOSABS 0.0 11/11/2012 1745      Chemistry      Component Value Date/Time   NA 140 11/11/2012 1745   K 4.7 11/11/2012 1745   CL 105 11/11/2012 1745   CO2 26 11/11/2012 1745   BUN 30* 11/11/2012 1745   CREATININE 1.06 11/11/2012 1745   CREATININE 0.95 09/28/2012 1308      Component Value Date/Time   CALCIUM 9.1 11/11/2012 1745   ALKPHOS 72 11/11/2012 1745   AST 19 11/11/2012 1745   ALT 15 11/11/2012 1745   BILITOT 0.4 11/11/2012 1745         ASSESSMENT:  1. Poorly differentiated squamous cell carcinoma the right parotid gland with positive margins status post resection on 10/28/2012. This mass was initially felt to be 1.7 cm at the time of presentation but had enlarged significantly to 4 cm clinically at the time of surgery. Pathologically it was 3.4 cm. A lymph node deep to the parotid  was enlarged clinically at the time of surgery but was negative pathologically. Along a broad margin, there was less than 1 mm of tumor to border. S/P Radiation therapy by Dr. Thersa [name] finishing on 01/11/2013. 2. Atrial fibrillation requiring pacemaker insertion recently by Dr. Ladona Ridgel.  Dr. Ladona Ridgel is managing with Xarelto 3. Hypertension  4. Excessive alcohol use  5. Carotid artery stenosis, asymptomatic  6. Bilateral cataract operations with lens implants  7. Carotid endarterectomy in the past  8. Severe difficulty hearing though he refuses to wear hearing 9. Oral candidiasis   PLAN:  1. I personally reviewed and went over laboratory results with the patient. 2. Lab work in 6 weeks: CBC diff, CMET 3. CT of head and neck with contrast in 7-8 weeks for new baseline study. 4. [facility] e-scribed to [facility] pharmacy.   5. Return in 8 weeks after CT scan for follow-up.   All questions were answered. The patient knows to call the clinic with any problems, questions or concerns. We can certainly see the patient much sooner if necessary.  The patient and plan discussed with  Everardo All, MD and he is in agreement with the aforementioned.  Landry Lookingbill

## 2013-01-19 NOTE — Patient Instructions (Addendum)
[facility] Discharge Instructions  RECOMMENDATIONS MADE BY THE CONSULTANT AND ANY TEST RESULTS WILL BE SENT TO YOUR REFERRING PHYSICIAN.  We will get you scheduled for CT scans in 8 weeks. You will need to come a few days prior to CT scan for lab work. A prescription has been sent to your pharmacy for [facility], use as directed. Return to clinic in 8 weeks to see MD after CT scan.  Thank you for choosing [facility] to provide your oncology and hematology care.  To afford each patient quality time with our providers, please arrive at least 15 minutes before your scheduled appointment time.  With your help, our goal is to use those 15 minutes to complete the necessary work-up to ensure our physicians have the information they need to help with your evaluation and healthcare recommendations.    Effective January 1st, 2014, we ask that you re-schedule your appointment with our physicians should you arrive 10 or more minutes late for your appointment.  We strive to give you quality time with our providers, and arriving late affects you and other patients whose appointments are after yours.    Again, thank you for choosing [facility].  Our hope is that these requests will decrease the amount of time that you wait before being seen by our physicians.       _____________________________________________________________  Should you have questions after your visit to [facility], please contact our office at [phone] between the hours of 8:30 a.m. and 5:00 p.m.  Voicemails left after 4:30 p.m. will not be returned until the following business day.  For prescription refill requests, have your pharmacy contact our office with your prescription refill request.

## 2013-02-11 ENCOUNTER — Ambulatory Visit (INDEPENDENT_AMBULATORY_CARE_PROVIDER_SITE_OTHER): Payer: Medicare Other | Admitting: Otolaryngology

## 2013-02-11 DIAGNOSIS — C07 Malignant neoplasm of parotid gland: Secondary | ICD-10-CM

## 2013-03-03 ENCOUNTER — Encounter (HOSPITAL_COMMUNITY): Payer: Medicare Other | Attending: Oncology

## 2013-03-03 DIAGNOSIS — C07 Malignant neoplasm of parotid gland: Secondary | ICD-10-CM | POA: Insufficient documentation

## 2013-03-03 LAB — COMPREHENSIVE METABOLIC PANEL
Alkaline Phosphatase: 67 U/L (ref 39–117)
BUN: 19 mg/dL (ref 6–23)
CO2: 27 mEq/L (ref 19–32)
Chloride: 104 mEq/L (ref 96–112)
GFR calc Af Amer: >90 mL/min (ref >90)
GFR calc non Af Amer: 80 mL/min — ABNORMAL LOW (ref >90)
Glucose, Bld: 109 mg/dL — ABNORMAL HIGH (ref 70–99)
Potassium: 4.3 mEq/L (ref 3.5–5.1)
Total Bilirubin: 0.8 mg/dL (ref 0.3–1.2)

## 2013-03-03 LAB — CBC WITH DIFFERENTIAL/PLATELET
Basophils Absolute: 0.1 10*3/uL (ref 0.0–0.1)
HCT: 44.8 % (ref 39.0–52.0)
Lymphocytes Relative: 21 % (ref 12–46)
Monocytes Absolute: 0.6 10*3/uL (ref 0.1–1.0)
Neutro Abs: 2.9 10*3/uL (ref 1.7–7.7)
Neutrophils Relative %: 58 % (ref 43–77)
RDW: 15.4 % (ref 11.5–15.5)
WBC: 5 10*3/uL (ref 4.0–10.5)

## 2013-03-03 NOTE — Progress Notes (Signed)
Labs drawn today for cbc/diff,cmp 

## 2013-03-08 ENCOUNTER — Encounter (HOSPITAL_COMMUNITY): Payer: Self-pay

## 2013-03-08 ENCOUNTER — Ambulatory Visit (HOSPITAL_COMMUNITY)
Admission: RE | Admit: 2013-03-08 | Discharge: 2013-03-08 | Disposition: A | Discharge status: Home / Self Care | Payer: Medicare Other | Source: Ambulatory Visit | Attending: Oncology | Admitting: Oncology

## 2013-03-08 DIAGNOSIS — Z923 Personal history of irradiation: Secondary | ICD-10-CM | POA: Insufficient documentation

## 2013-03-08 DIAGNOSIS — C07 Malignant neoplasm of parotid gland: Secondary | ICD-10-CM | POA: Insufficient documentation

## 2013-03-08 MED ORDER — IOHEXOL 300 MG/ML  SOLN
75.0000 mL | Freq: Once | INTRAMUSCULAR | Status: AC | PRN
  Administered 2013-03-08: 75 mL via INTRAVENOUS

## 2013-03-10 ENCOUNTER — Encounter (HOSPITAL_BASED_OUTPATIENT_CLINIC_OR_DEPARTMENT_OTHER): Payer: Medicare Other | Admitting: Oncology

## 2013-03-10 VITALS — BP 142/78 | HR 66 | Temp 98.1°F | Resp 18 | Wt 158.3 lb

## 2013-03-10 DIAGNOSIS — I4891 Unspecified atrial fibrillation: Secondary | ICD-10-CM

## 2013-03-10 DIAGNOSIS — R220 Localized swelling, mass and lump, head: Secondary | ICD-10-CM

## 2013-03-10 DIAGNOSIS — R221 Localized swelling, mass and lump, neck: Secondary | ICD-10-CM

## 2013-03-10 DIAGNOSIS — C07 Malignant neoplasm of parotid gland: Secondary | ICD-10-CM

## 2013-03-10 NOTE — Progress Notes (Signed)
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 Thersa Salt. He is here today with his daughter Babette Relic and he is feeling quite good though his taste buds have not recovered. He has gained a few pounds she is good however. He is back to shoeing courses. He still has his atrial fibrillation with pacemaker by Dr. Ladona Ridgel and he states is doing well. Recent checkup.  His other problems are mentioned in my note from 11/11/2012. He still remains hard of hearing. His oncology review of systems hours negative. He does have a little swelling in the midline of his chin and neck area which I suspect is post radiation therapy only. There is no palpable irregularity just some edema and nothing is seen on the CT scan of the soft tissues of the neck from the other day either. There is some nonspecific changes in the right parotid gland which need followup.   On physical exam his vital signs are quite stable. He is in no acute distress. He remains hard of hearing. There is no adenopathy in the cervical or supraclavicular or infraclavicular areas. He has no axillary adenopathy. Lungs are clear. His heart shows a pretty regular rhythm presently. Pacemaker is in the left upper chest wall. There is some edema of the midline of the neck soft tissues only it's minimal.  His daughter we'll watch this area but I suspect this radiation is we have seen with other had and neck patients.  You will need lab work prior to his CAT scan in July which I will schedule. He will see Dr. Thersa Salt and [place] right afterwards.

## 2013-03-10 NOTE — Patient Instructions (Addendum)
.  [facility] Discharge Instructions  RECOMMENDATIONS MADE BY THE CONSULTANT AND ANY TEST RESULTS WILL BE SENT TO YOUR REFERRING PHYSICIAN.  EXAM FINDINGS BY THE PHYSICIAN TODAY AND SIGNS OR SYMPTOMS TO REPORT TO CLINIC OR PRIMARY PHYSICIAN: Exam per Dr. Mariel Sleet     INSTRUCTIONS GIVEN AND DISCUSSED: We will do labs then CT scans and then return to see Jenita Seashore PA     Thank you for choosing [facility] to provide your oncology and hematology care.  To afford each patient quality time with our providers, please arrive at least 15 minutes before your scheduled appointment time.  With your help, our goal is to use those 15 minutes to complete the necessary work-up to ensure our physicians have the information they need to help with your evaluation and healthcare recommendations.    Effective January 1st, 2014, we ask that you re-schedule your appointment with our physicians should you arrive 10 or more minutes late for your appointment.  We strive to give you quality time with our providers, and arriving late affects you and other patients whose appointments are after yours.    Again, thank you for choosing [facility].  Our hope is that these requests will decrease the amount of time that you wait before being seen by our physicians.       _____________________________________________________________  Should you have questions after your visit to [facility], please contact our office at [phone] between the hours of 8:30 a.m. and 5:00 p.m.  Voicemails left after 4:30 p.m. will not be returned until the following business day.  For prescription refill requests, have your pharmacy contact our office with your prescription refill request.

## 2013-03-12 ENCOUNTER — Ambulatory Visit (HOSPITAL_COMMUNITY): Payer: Medicare Other | Admitting: Oncology

## 2013-03-19 ENCOUNTER — Other Ambulatory Visit (INDEPENDENT_AMBULATORY_CARE_PROVIDER_SITE_OTHER): Payer: Medicare Other | Admitting: *Deleted

## 2013-03-19 ENCOUNTER — Ambulatory Visit: Payer: Medicare Other | Admitting: Neurosurgery

## 2013-03-19 DIAGNOSIS — I6529 Occlusion and stenosis of unspecified carotid artery: Secondary | ICD-10-CM

## 2013-03-19 DIAGNOSIS — Z48812 Encounter for surgical aftercare following surgery on the circulatory system: Secondary | ICD-10-CM

## 2013-03-22 ENCOUNTER — Other Ambulatory Visit: Payer: Self-pay

## 2013-03-22 DIAGNOSIS — Z48812 Encounter for surgical aftercare following surgery on the circulatory system: Secondary | ICD-10-CM

## 2013-03-23 ENCOUNTER — Encounter: Payer: Self-pay | Admitting: Vascular Surgery

## 2013-03-24 ENCOUNTER — Ambulatory Visit (HOSPITAL_COMMUNITY): Payer: Medicare Other | Admitting: Oncology

## 2013-05-31 ENCOUNTER — Encounter (HOSPITAL_COMMUNITY): Payer: Medicare Other | Attending: Oncology

## 2013-05-31 DIAGNOSIS — C07 Malignant neoplasm of parotid gland: Secondary | ICD-10-CM | POA: Insufficient documentation

## 2013-05-31 LAB — COMPREHENSIVE METABOLIC PANEL
Albumin: 3.5 g/dL (ref 3.5–5.2)
Alkaline Phosphatase: 70 U/L (ref 39–117)
BUN: 23 mg/dL (ref 6–23)
Chloride: 103 mEq/L (ref 96–112)
Glucose, Bld: 100 mg/dL — ABNORMAL HIGH (ref 70–99)
Potassium: 4.2 mEq/L (ref 3.5–5.1)
Total Bilirubin: 0.9 mg/dL (ref 0.3–1.2)

## 2013-05-31 LAB — CBC WITH DIFFERENTIAL/PLATELET
Basophils Absolute: 0.1 10*3/uL (ref 0.0–0.1)
Eosinophils Relative: 10 % — ABNORMAL HIGH (ref 0–5)
Lymphocytes Relative: 27 % (ref 12–46)
MCV: 96.2 fL (ref 78.0–100.0)
Neutro Abs: 2.9 10*3/uL (ref 1.7–7.7)
Platelets: 214 10*3/uL (ref 150–400)
RDW: 14 % (ref 11.5–15.5)
WBC: 5.8 10*3/uL (ref 4.0–10.5)

## 2013-05-31 NOTE — Progress Notes (Signed)
Labs drawn today for cbc/diff,cmp 

## 2013-06-02 ENCOUNTER — Ambulatory Visit (HOSPITAL_COMMUNITY)
Admission: RE | Admit: 2013-06-02 | Discharge: 2013-06-02 | Disposition: A | Discharge status: Home / Self Care | Payer: Medicare Other | Source: Ambulatory Visit | Attending: Oncology | Admitting: Oncology

## 2013-06-02 DIAGNOSIS — Z09 Encounter for follow-up examination after completed treatment for conditions other than malignant neoplasm: Secondary | ICD-10-CM | POA: Insufficient documentation

## 2013-06-02 DIAGNOSIS — C07 Malignant neoplasm of parotid gland: Secondary | ICD-10-CM | POA: Insufficient documentation

## 2013-06-02 MED ORDER — IOHEXOL 300 MG/ML  SOLN
75.0000 mL | Freq: Once | INTRAMUSCULAR | Status: AC | PRN
  Administered 2013-06-02: 75 mL via INTRAVENOUS

## 2013-06-04 ENCOUNTER — Encounter (HOSPITAL_BASED_OUTPATIENT_CLINIC_OR_DEPARTMENT_OTHER): Payer: Medicare Other | Admitting: Oncology

## 2013-06-04 VITALS — BP 117/78 | HR 58 | Temp 98.4°F | Resp 18 | Wt 152.8 lb

## 2013-06-04 DIAGNOSIS — C07 Malignant neoplasm of parotid gland: Secondary | ICD-10-CM

## 2013-06-04 NOTE — Progress Notes (Signed)
Fernando Reichert, MD [address] [place] [address]  Squamous cell carcinoma of parotid  CURRENT THERAPY: Surveillance  INTERVAL HISTORY: Fernando [name] 79 y.o. 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 Thersa Salt.   The patient is doing well. On review of his vitals, and it is noted that he is lost 4 pounds since April 2014. He was encouraged to continue with 3 doses daily which she is already doing.  I personally reviewed and went over laboratory results with the patient. Labs from 77 show a white blood cell count of 5.8, hemoglobin 15.7, platelet count 214,000, sodium 141, potassium 4.2, chloride 103, E1 23, creatinine 0.90, AST 21 ALT 14.  I personally reviewed and went over radiographic studies with the patient. CT scan of soft tissue of neck with contrast performed on 06/02/2013 shows that there is no change since his last CT scan on 03/08/2013. The post operative and post surgical changes in the region of the right parotid. No sign of increasing density or tissue changes to suggest residual or recurrent disease at this time. No enlarged lymph nodes in the right side of the neck. No change in the low density lesion of the left thyroid since 08/17/2012.  I discussed the patient's case with Dr. Thersa Salt (radiation oncologist) and we developed a plan consisting of repeat CT scan in 6 months and he'll return to our clinic in 6 months time. He has an appointment with Dr. Thersa Salt for followup tomorrow and the patient was encouraged to maintain this appointment. Dr. Thersa Salt reports to me, that he will see the patient back up roughly 9 months from tomorrow and this will place the patient on a very good schedule of seeing [place] in radiation oncology.  Oncologically, the patient denies any complaints of ROS questioning is  negative. He continues to have difficulties with his tasting of foods and I suspect this is secondary to radiation therapy. He also had some enlargement of soft tissue of the neck anterior to the midline of the trachea that is thick on palpation nontender. The patient's daughter reports that it has decreased in size in the past 3 months.   Past Medical History  Diagnosis Date  . Carotid artery disease     Dr. Hart Rochester - s/p left CEA  . Hyperlipidemia   . Essential hypertension, benign   . Arthritis   . Atrial fibrillation   . Tachycardia-bradycardia syndrome   . 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 This mass was initially felt to be 1.7 cm at the time of presentation but had enlarged significantly to 4 cm clinically at the time of surgery. Pathologically it was 3.4 cm. A lymph node deep to the parotid was enlarged clinically at the time of surgery but was negative     has Carotid artery stenosis, asymptomatic; Atrial fibrillation; Essential hypertension, benign; Tachy-brady syndrome; Pacemaker-St.Jude; and Squamous cell carcinoma of parotid on his problem list.     has No Known Allergies.  Fernando [name] does not currently have medications on file.  Past Surgical History  Procedure Laterality Date  . Carotid endarterectomy    . Cataract extraction w/ intraocular lens implant      Bilateral  . Pacemaker insertion  10-01-2012  .  Insert / replace / remove pacemaker    . Parathyroidectomy  10/28/2012  . Parotidectomy  10/28/2012    Procedure: PAROTIDECTOMY;  Surgeon: Darletta Moll, MD;  Location: [facility];  Service: ENT;  Laterality: Right;  Total Parotidectomy    Denies any headaches, dizziness, double vision, fevers, chills, night sweats, nausea, vomiting, diarrhea, constipation, chest pain, heart palpitations, shortness of breath, blood in stool, black tarry  stool, urinary pain, urinary burning, urinary frequency, hematuria.   PHYSICAL EXAMINATION  ECOG PERFORMANCE STATUS: 1 - Symptomatic but completely ambulatory  Filed Vitals:   06/04/13 1037  BP: 117/78  Pulse: 58  Temp: 98.4 F (36.9 C)  Resp: 18    GENERAL:alert, no distress, well nourished, well developed, comfortable, cooperative and smiling SKIN: skin color, texture, turgor are normal, no rashes or significant lesions HEAD: Normocephalic, No masses, lesions, tenderness or abnormalities EYES: normal, PERRLA, EOMI, Conjunctiva are pink and non-injected EARS: External ears normal OROPHARYNX:mucous membranes are moist, no erythema, no exudates NECK: supple, no adenopathy, thyroid normal size, non-tender, without nodularity, no stridor, non-tender, trachea midline, anterior neck thickening secondary to radiation. LYMPH:  no palpable lymphadenopathy, no hepatosplenomegaly BREAST:not examined LUNGS: clear to auscultation and percussion HEART: regular rate & rhythm, no murmurs, no gallops, S1 normal and S2 normal ABDOMEN:abdomen soft, non-tender, normal bowel sounds, no masses or organomegaly and no hepatosplenomegaly BACK: Back symmetric, no curvature., No CVA tenderness EXTREMITIES:less then 2 second capillary refill, no joint deformities, effusion, or inflammation, no edema, no skin discoloration, no clubbing, no cyanosis  NEURO: alert & oriented x 3 with fluent speech, no focal motor/sensory deficits, gait normal    LABORATORY DATA: CBC    Component Value Date/Time   WBC 5.8 05/31/2013 1040   RBC 4.73 05/31/2013 1040   HGB 15.7 05/31/2013 1040   HCT 45.5 05/31/2013 1040   PLT 214 05/31/2013 1040   MCV 96.2 05/31/2013 1040   MCH 33.2 05/31/2013 1040   MCHC 34.5 05/31/2013 1040   RDW 14.0 05/31/2013 1040   LYMPHSABS 1.6 05/31/2013 1040   MONOABS 0.8 05/31/2013 1040   EOSABS 0.6 05/31/2013 1040   BASOSABS 0.1 05/31/2013 1040      Chemistry      Component Value Date/Time   NA 141 05/31/2013  1040   K 4.2 05/31/2013 1040   CL 103 05/31/2013 1040   CO2 29 05/31/2013 1040   BUN 23 05/31/2013 1040   CREATININE 0.90 05/31/2013 1040   CREATININE 0.95 09/28/2012 1308      Component Value Date/Time   CALCIUM 9.2 05/31/2013 1040   ALKPHOS 70 05/31/2013 1040   AST 21 05/31/2013 1040   ALT 14 05/31/2013 1040   BILITOT 0.9 05/31/2013 1040        RADIOGRAPHIC STUDIES:  06/02/2013  *RADIOLOGY REPORT*  Clinical Data: Follow-up right parotid cancer. Treated with surgery  and radiation.  CT NECK WITH CONTRAST  Technique: Multidetector CT imaging of the neck was performed with  intravenous contrast.  Contrast: 75mL OMNIPAQUE IOHEXOL 300 MG/ML SOLN  Comparison: CT 08/17/2012. PET scan 11/17/2012. CT 03/08/2012.  Findings: Lung apices shows some pleural and parenchymal scarring  with some pleural calcification on the right. No active process.  Limited visualization of the intracranial contents does not show  any abnormality.  The patient has had partial resection of the parotid on the right.  There is some increased density in the region of surgery presumably  related to scarring and/or radiation change. The overall pattern  looks identical to  the study of 03/08/2013 without increasing mass  effect or contour change that would allow diagnosis of recurrent or  progressive tumor. The left parotid is normal. There are no  enlarged lymph nodes on either side of the neck. Submandibular  glands are unremarkable. Left thyroid cystic abnormality measuring  18 x 12 mm again seen with wall calcification. This is not  changing.  There is atherosclerotic disease of both carotid bifurcations. On  the left, there appears to have been previous carotid  endarterectomy. On the right, there is severe stenosis of the  proximal ICA with luminal diameter measuring 2 mm. Compared to a  more distal cervical ICA diameter of 5 mm, this indicates a 60%  stenosis.  IMPRESSION:  No change since 03/08/2013. The  postoperative and postsurgical  changes in the region of the right parotid. No sign of increasing  density or tissue changes to suggest residual or recurrent disease  at this time. No enlarged lymph nodes in the right side of the  neck.  No change in a low density lesion of the left thyroid since  08/17/2012  Original Report Authenticated By: Paulina Fusi, M.D.    PATHOLOGY:  10/28/2012  Diagnosis 1. Parotid gland, Right - POORLY DIFFERENTIATED CARCINOMA, 3.4 CM. - CARCINOMA EXTENDS VERY CLOSE (<0.1 CM) TO NON ORIENTED INKED MARGIN(S). - SEE ONCOLOGY TABLE BELOW. 2. Lymph node, biopsy, Right - THERE IS NO EVIDENCE OF CARCINOMA IN 1 OF 1 LYMPH NODE (0/1). Microscopic Comment 1. ONCOLOGY TABLE - SALIVARY GLAND 1. Specimen: Right parotid 2. Procedure: Excision 3. Tumor focality: Unifocal 4. Maximum tumor size (cm): 3.4 cm 5. Histologic type: Poorly differentiated carcinoma with focal keratin formation, consistent with squamous cell carcinoma 6. Grade: High grade 7. Margins: Broadly close (<0.1 cm) to non-oriented margin(s). 8. Perineural invasion: Not identified 9. Lymph-Vascular invasion: Not identified 10. Lymph nodes: # examined - 1; # positive - 0; 11. TNM code: pT2 Pecola Leisure MD Pathologist, Electronic Signature (Case signed 10/30/2012)    ASSESSMENT:  1. Poorly differentiated squamous cell carcinoma the right parotid gland with positive margins status post resection on 10/28/2012 followed by radiation therapy by Dr. Thersa Salt. 2. Decreased taste, with positive appetite 3. Weight loss, 4 lbs since April 2014 4. Anterior neck thickening, secondary to surgery/radiation  Patient Active Problem List   Diagnosis Date Noted  . Squamous cell carcinoma of parotid 01/19/2013  . Pacemaker-St.Jude 10/02/2012  . Atrial fibrillation 09/09/2012  . Essential hypertension, benign 09/09/2012  . Tachy-brady syndrome 09/09/2012  . Carotid artery stenosis, asymptomatic 03/19/2012      PLAN:  1. I personally reviewed and went over laboratory results with the patient. 2. I personally reviewed and went over radiographic studies with the patient. 3. Discussed the patient's case with Dr. Thersa Salt. 4. Labs in 6 months: CBC diff, CMET 5. CT neck with contrast in 6 months 7. Recommended continuation of boost supplementation in diet.  8. Return in 6 months for follow-up.   THERAPY PLAN:  The patient is recovering nicely from radiation and surgery. His most recent CT scan is negative for any recurrence or relapse of we'll repeat CT scan in 6 months and see him back in 6 months. He'll continue followup with radiation oncology.cancer.  All questions were answered. The patient knows to call the clinic with any problems, questions or concerns. We can certainly see the patient much sooner if necessary.  Patient and plan discussed with Dr. Gerarda Fraction and he is in agreement with the aforementioned.  Fernando [name]

## 2013-06-04 NOTE — Patient Instructions (Signed)
[facility] Discharge Instructions  RECOMMENDATIONS MADE BY THE CONSULTANT AND ANY TEST RESULTS WILL BE SENT TO YOUR REFERRING PHYSICIAN.  EXAM FINDINGS BY THE PHYSICIAN TODAY AND SIGNS OR SYMPTOMS TO REPORT TO CLINIC OR PRIMARY PHYSICIAN: Exam findings as discussed by T. Kefalas, PA-C.  SPECIAL INSTRUCTIONS/FOLLOW-UP: 1.  You are going to be scheduled for labs and CT in 6 months.  After your CT we will see you again in the office. 2.  Continue to use Boosts as supplements to your regular meals.  Thank you for choosing [facility] to provide your oncology and hematology care.  To afford each patient quality time with our providers, please arrive at least 15 minutes before your scheduled appointment time.  With your help, our goal is to use those 15 minutes to complete the necessary work-up to ensure our physicians have the information they need to help with your evaluation and healthcare recommendations.    Effective January 1st, 2014, we ask that you re-schedule your appointment with our physicians should you arrive 10 or more minutes late for your appointment.  We strive to give you quality time with our providers, and arriving late affects you and other patients whose appointments are after yours.    Again, thank you for choosing [facility].  Our hope is that these requests will decrease the amount of time that you wait before being seen by our physicians.       _____________________________________________________________  Should you have questions after your visit to [facility], please contact our office at [phone] between the hours of 8:30 a.m. and 5:00 p.m.  Voicemails left after 4:30 p.m. will not be returned until the following business day.  For prescription refill requests, have your pharmacy contact our office with your prescription refill request.

## 2013-06-17 IMAGING — CR DG CHEST 2V
2 series · 2 of 2 positions shown · non-contrast
Comparison: 12/21/2007

CLINICAL DATA: Status post pacemaker insertion

CHEST - 2 VIEW

[w chest pa]
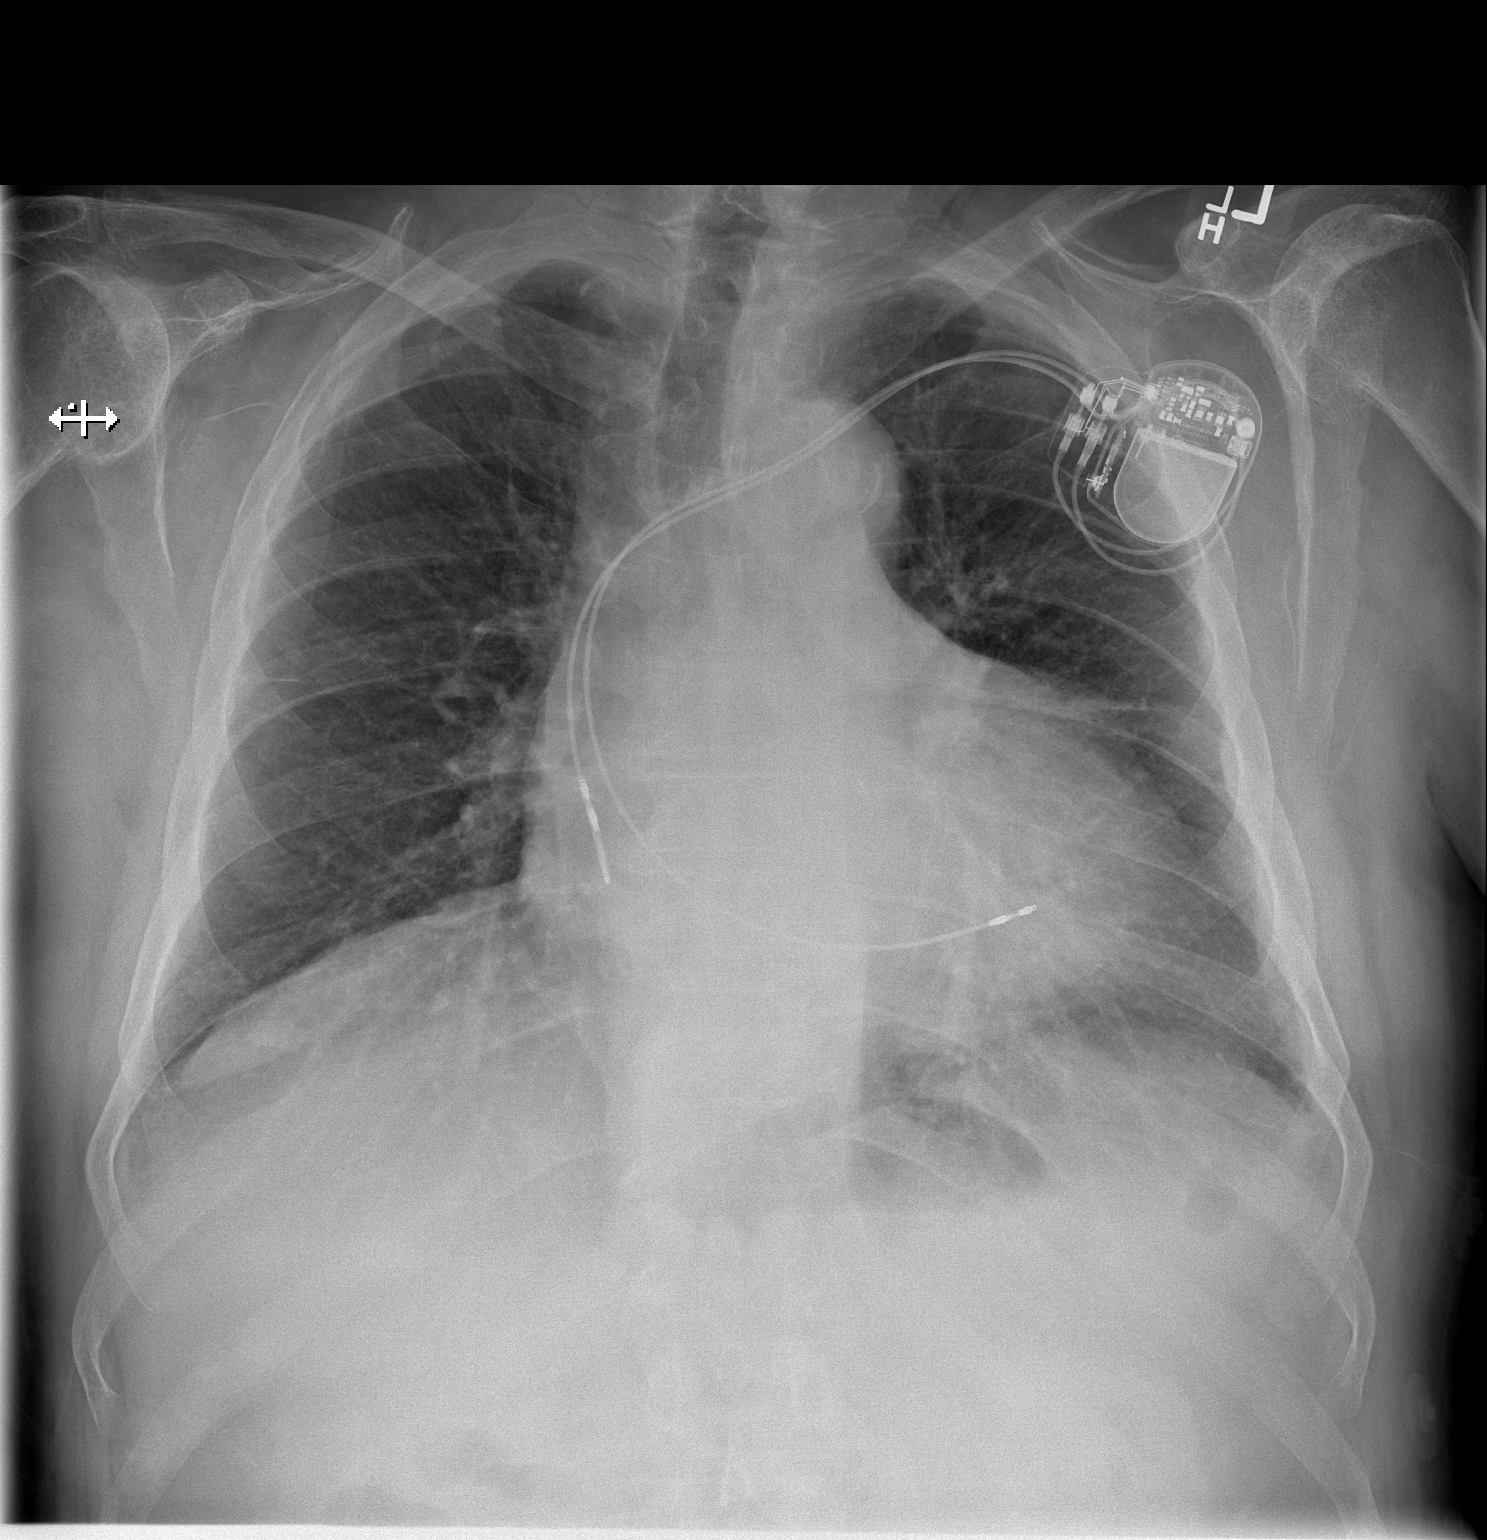

[w chest lat]
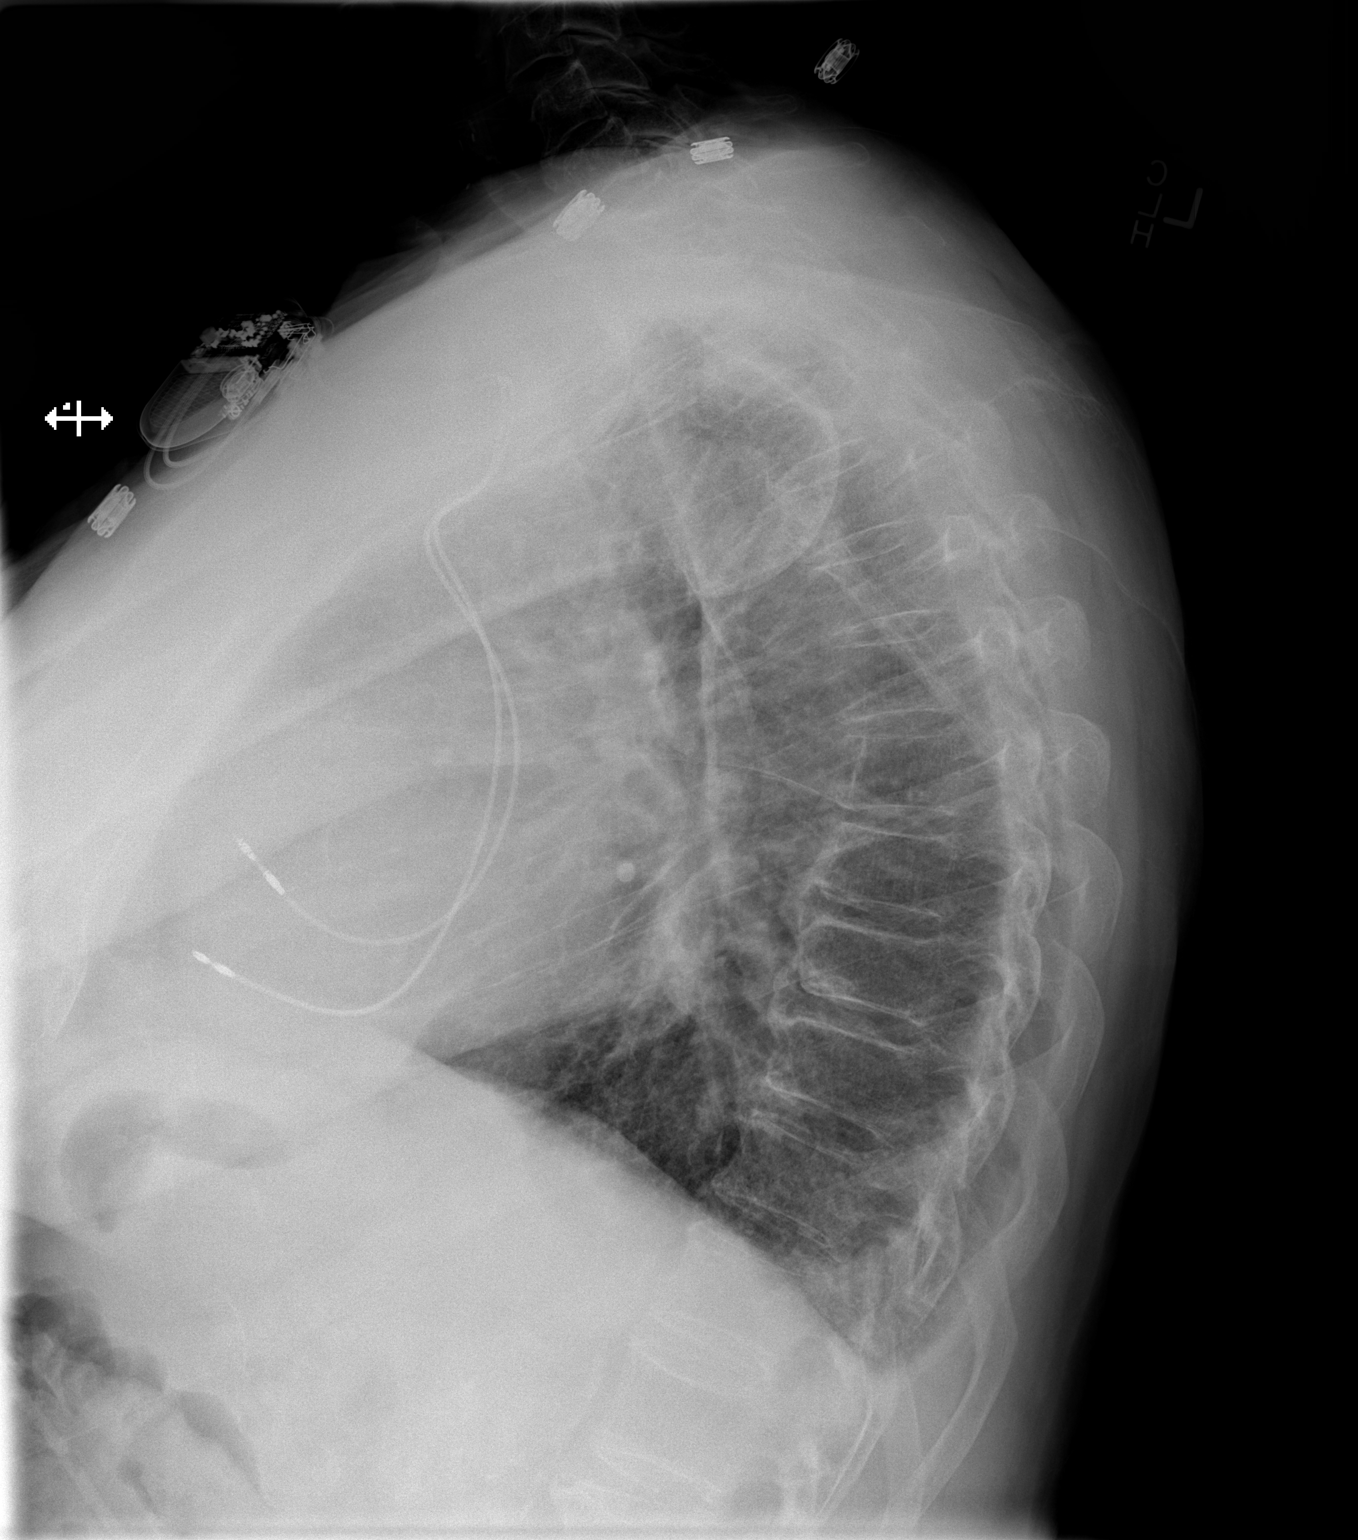

[2 of 2 positions shown; findings below may reference images not displayed]

FINDINGS: The pacemaker is now seen.  The cardiac shadow is within
normal limits.  The lungs are clear.  No pneumothorax is noted.
IMPRESSION: No acute abnormality following pacemaker placement.

## 2013-08-19 ENCOUNTER — Ambulatory Visit (INDEPENDENT_AMBULATORY_CARE_PROVIDER_SITE_OTHER): Payer: Medicare Other | Admitting: Otolaryngology

## 2013-08-19 DIAGNOSIS — C07 Malignant neoplasm of parotid gland: Secondary | ICD-10-CM

## 2013-09-20 ENCOUNTER — Encounter: Payer: Self-pay | Admitting: Vascular Surgery

## 2013-09-21 ENCOUNTER — Encounter (INDEPENDENT_AMBULATORY_CARE_PROVIDER_SITE_OTHER): Payer: Self-pay

## 2013-09-21 ENCOUNTER — Encounter: Payer: Self-pay | Admitting: Vascular Surgery

## 2013-09-21 ENCOUNTER — Ambulatory Visit (INDEPENDENT_AMBULATORY_CARE_PROVIDER_SITE_OTHER): Payer: Medicare Other | Admitting: Vascular Surgery

## 2013-09-21 ENCOUNTER — Ambulatory Visit (HOSPITAL_COMMUNITY)
Admission: RE | Admit: 2013-09-21 | Discharge: 2013-09-21 | Disposition: A | Discharge status: Home / Self Care | Payer: Medicare Other | Source: Ambulatory Visit | Attending: Vascular Surgery | Admitting: Vascular Surgery

## 2013-09-21 DIAGNOSIS — Z48812 Encounter for surgical aftercare following surgery on the circulatory system: Secondary | ICD-10-CM | POA: Insufficient documentation

## 2013-09-21 DIAGNOSIS — I6529 Occlusion and stenosis of unspecified carotid artery: Secondary | ICD-10-CM | POA: Insufficient documentation

## 2013-09-21 NOTE — Progress Notes (Signed)
Subjective:     Patient ID: Fernando [name], male   DOB: 1927/01/28, 79 y.o.   MRN: [id number]  HPI this 79 year old male is seen for followup regarding his carotid occlusive disease. He underwent a left carotid endarterectomy with resection of a redundant segment in 2009 has done well since that time from a neurologic standpoint. He has a moderate right ICA stenosis which we have been following. He did have problems with cancer of the parotid gland last year and has concluded treatment and is doing well. He denies any neurologic symptoms such as amaurosis fugax, diplopia, blurred vision, syncope, lateralizing weakness, or aphasia. He has no history of stroke.  Past Medical History  Diagnosis Date  . Carotid artery disease     Dr. Hart Rochester - s/p left CEA  . Hyperlipidemia   . Essential hypertension, benign   . Arthritis   . Atrial fibrillation   . Tachycardia-bradycardia syndrome   . 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 This mass was initially felt to be 1.7 cm at the time of presentation but had enlarged significantly to 4 cm clinically at the time of surgery. Pathologically it was 3.4 cm. A lymph node deep to the parotid was enlarged clinically at the time of surgery but was negative     History  Substance Use Topics  . Smoking status: Former Smoker    Types: Cigarettes    Quit date: 11/25/1972  . Smokeless tobacco: Not on file  . Alcohol Use: 0.6 oz/week    1 Cans of beer per week     Comment: daily    Family History  Problem Relation Age of Onset  . Heart disease Sister   . Heart attack Sister   . Cirrhosis Mother   . Cancer Brother     brain cancer  . Cancer Sister     stomach cancer    No Known Allergies  Current outpatient prescriptions:ARIPiprazole (ABILIFY) 20 MG tablet, Take 20 mg by mouth daily., Disp: , Rfl: ;  atorvastatin  (LIPITOR) 40 MG tablet, Take 40 mg by mouth every morning. , Disp: , Rfl: ;  Glucosamine HCl 1500 MG TABS, Take 1,500 mg by mouth every morning., Disp: , Rfl: ;  losartan (COZAAR) 50 MG tablet, Take 50 mg by mouth every morning. , Disp: , Rfl:  metoprolol tartrate (LOPRESSOR) 25 MG tablet, Take 25 mg by mouth 2 (two) times daily., Disp: , Rfl: ;  naproxen sodium (ALEVE) 220 MG tablet, Take 440 mg by mouth every 12 (twelve) hours as needed. For headaches, Disp: , Rfl: ;  Rivaroxaban (XARELTO) 20 MG TABS, Take 1 tablet (20 mg total) by mouth daily., Disp: 30 tablet, Rfl: 9 Diphenhyd-Hydrocort-Nystatin (FIRST-DUKES MOUTHWASH) SUSP, Use as directed 5 mLs in the mouth or throat 4 (four) times daily as needed., Disp: 420 mL, Rfl: 0;  oxyCODONE-acetaminophen (PERCOCET/ROXICET) 5-325 MG per tablet, Take 1-2 tablets by mouth every 6 (six) hours as needed for pain., Disp: 30 tablet, Rfl: 0  BP 174/71  Pulse 58  Ht 5\' 5"  (1.651 m)  Wt 151 lb 12.8 oz (68.856 kg)  BMI 25.26 kg/m2  SpO2 100%  Body mass index is 25.26 kg/(m^2).           Review of Systems does have problems with his right knee and has had a history of irregular heartbeat currently has a pacemaker and is on Xeralto.  Denies chest pain, dyspnea on exertion, PND, orthopnea, hemoptysis. All other systems negative and complete review of systems     Objective:   Physical Exam BP 174/71  Pulse 58  Ht 5\' 5"  (1.651 m)  Wt 151 lb 12.8 oz (68.856 kg)  BMI 25.26 kg/m2  SpO2 100%  Gen.-alert and oriented x3 in no apparent distress HEENT normal for age Lungs no rhonchi or wheezing Cardiovascular regular rhythm no murmurs carotid pulses 3+ palpable no bruits audible Abdomen soft nontender no palpable masses Musculoskeletal free of  major deformities Skin clear -no rashes Neurologic normal Lower extremities 3+ femoral and dorsalis pedis pulses palpable bilaterally with no edema  Today I ordered a carotid duplex exam which are reviewed and  interpreted. Left carotid endarterectomy site is widely patent. There is a moderate right ICA stenosis which has progressed slightly since the previous study performed in April of 2014. This reveals an approximate 60% right ICA stenosis        Assessment:     60% right ICA stenosis-asymptomatic-status post left carotid endarterectomy in 2009    Plan:     Return in one year for carotid duplex exam in sitting nurse practitioner unless develops neurologic symptoms in the interim

## 2013-09-30 ENCOUNTER — Ambulatory Visit (INDEPENDENT_AMBULATORY_CARE_PROVIDER_SITE_OTHER): Payer: Medicare Other | Admitting: Internal Medicine

## 2013-09-30 ENCOUNTER — Encounter: Payer: Self-pay | Admitting: Internal Medicine

## 2013-09-30 VITALS — BP 117/79 | HR 57 | Ht 65.0 in | Wt 150.1 lb

## 2013-09-30 DIAGNOSIS — I4891 Unspecified atrial fibrillation: Secondary | ICD-10-CM

## 2013-09-30 DIAGNOSIS — Z950 Presence of cardiac pacemaker: Secondary | ICD-10-CM

## 2013-09-30 NOTE — Patient Instructions (Signed)
Your physician recommends that you schedule a follow-up appointment in: 1 year with Dr Ladona Ridgel and a merlin check on 12/30/2013 You will receive a reminder letter two months in advance reminding you to call and schedule your appointment. If you don't receive this letter, please contact our office.

## 2013-09-30 NOTE — Assessment & Plan Note (Signed)
His St. Jude dual-chamber pacemaker is working normally. We'll plan to recheck in several months. 

## 2013-09-30 NOTE — Progress Notes (Signed)
HPI Mr. Moulder returns today for followup. He is a very pleasant 79 year old man with symptomatic tachybradycardia syndrome, persitent atrial fibrillation, and hypertension. He is status post insertion of a St. Jude dual-chamber pacemaker.  He denies palpitations, chest pain, or shortness of breath. No syncope.  He has been in persistent atrial fibrillation and his stroke risk is moderate. He is been maintained on systemic anticoagulation as noted below with no bleeding or falling. No Known Allergies   Current Outpatient Prescriptions  Medication Sig Dispense Refill  . ARIPiprazole (ABILIFY) 20 MG tablet Take 20 mg by mouth daily.      Marland Kitchen atorvastatin (LIPITOR) 40 MG tablet Take 40 mg by mouth every morning.       . Glucosamine HCl 1500 MG TABS Take 1,500 mg by mouth every morning.      Marland Kitchen losartan (COZAAR) 50 MG tablet Take 50 mg by mouth every morning.       . metoprolol tartrate (LOPRESSOR) 25 MG tablet Take 25 mg by mouth 2 (two) times daily.      . naproxen sodium (ALEVE) 220 MG tablet Take 440 mg by mouth every 12 (twelve) hours as needed. For headaches      . Rivaroxaban (XARELTO) 20 MG TABS Take 1 tablet (20 mg total) by mouth daily.  30 tablet  9   No current facility-administered medications for this visit.     Past Medical History  Diagnosis Date  . Carotid artery disease     Dr. Hart Rochester - s/p left CEA  . Hyperlipidemia   . Essential hypertension, benign   . Arthritis   . Atrial fibrillation   . Tachycardia-bradycardia syndrome   . 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 This mass was initially felt to be 1.7 cm at the time of presentation but had enlarged significantly to 4 cm clinically at the time of surgery. Pathologically it was 3.4 cm. A lymph node deep to the parotid was enlarged clinically at the time of surgery but was negative      ROS:   All systems reviewed and negative except as noted in the HPI.   Past Surgical History  Procedure Laterality Date  . Carotid endarterectomy    . Cataract extraction w/ intraocular lens implant      Bilateral  . Pacemaker insertion  10-01-2012  . Insert / replace / remove pacemaker    . Parathyroidectomy  10/28/2012  . Parotidectomy  10/28/2012    Procedure: PAROTIDECTOMY;  Surgeon: Darletta Moll, MD;  Location: [facility];  Service: ENT;  Laterality: Right;  Total Parotidectomy     Family History  Problem Relation Age of Onset  . Heart disease Sister   . Heart attack Sister   . Cirrhosis Mother   . Cancer Brother     brain cancer  . Cancer Sister     stomach cancer     History   Social History  . Marital Status: Widowed    Spouse Name: N/A    Number of Children: N/A  . Years of Education: N/A   Occupational History  . Not on file.   Social History Main Topics  . Smoking status: Former Smoker    Types: Cigarettes    Quit date: 11/25/1972  . Smokeless tobacco: Not on file  . Alcohol Use: 0.6 oz/week    1 Cans of beer per week  Comment: daily  . Drug Use: No  . Sexual Activity: No   Other Topics Concern  . Not on file   Social History Narrative  . No narrative on file     BP 117/79  Pulse 57  Ht 5\' 5"  (1.651 m)  Wt 150 lb 1.3 oz (68.076 kg)  BMI 24.97 kg/m2  Physical Exam:  Well appearing, elderly man, NAD HEENT: Unremarkable Neck:  7 cm JVD, no thyromegally Lungs:  Clearwith no wheezes, rales, or rhonchi. HEART:  Regular rate rhythm, no murmurs, no rubs, no clicks Abd:  soft, positive bowel sounds, no organomegally, no rebound, no guarding Ext:  2 plus pulses, no edema, no cyanosis, no clubbing Skin:  No rashes no nodules Neuro:  CN II through XII intact, motor grossly intact  DEVICE  Normal device function.  See PaceArt for details.   Assess/Plan:

## 2013-09-30 NOTE — Assessment & Plan Note (Signed)
The patient remains in atrial fibrillation with a controlled ventricular response. No change in medical therapy.

## 2013-10-01 ENCOUNTER — Encounter: Payer: Self-pay | Admitting: Internal Medicine

## 2013-10-05 LAB — MDC_IDC_ENUM_SESS_TYPE_INCLINIC
Brady Statistic RA Percent Paced: 0.08 %
Brady Statistic RV Percent Paced: 56 %
Implantable Pulse Generator Serial Number: 7411696
Lead Channel Impedance Value: 412.5 Ohm
Lead Channel Setting Pacing Amplitude: 2.5 V
Lead Channel Setting Pacing Amplitude: 3.5 V
Lead Channel Setting Pacing Pulse Width: 0.4 ms

## 2013-12-02 ENCOUNTER — Other Ambulatory Visit: Payer: Self-pay | Admitting: Internal Medicine

## 2013-12-06 ENCOUNTER — Encounter (HOSPITAL_COMMUNITY): Payer: Medicare Other | Attending: Hematology and Oncology

## 2013-12-06 ENCOUNTER — Ambulatory Visit (HOSPITAL_COMMUNITY)
Admission: RE | Admit: 2013-12-06 | Discharge: 2013-12-06 | Disposition: A | Discharge status: Home / Self Care | Payer: Medicare Other | Source: Ambulatory Visit | Attending: Oncology | Admitting: Oncology

## 2013-12-06 DIAGNOSIS — Z09 Encounter for follow-up examination after completed treatment for conditions other than malignant neoplasm: Secondary | ICD-10-CM | POA: Insufficient documentation

## 2013-12-06 DIAGNOSIS — Z923 Personal history of irradiation: Secondary | ICD-10-CM | POA: Insufficient documentation

## 2013-12-06 DIAGNOSIS — I4891 Unspecified atrial fibrillation: Secondary | ICD-10-CM | POA: Insufficient documentation

## 2013-12-06 DIAGNOSIS — I6529 Occlusion and stenosis of unspecified carotid artery: Secondary | ICD-10-CM | POA: Insufficient documentation

## 2013-12-06 DIAGNOSIS — Z8589 Personal history of malignant neoplasm of other organs and systems: Secondary | ICD-10-CM | POA: Insufficient documentation

## 2013-12-06 DIAGNOSIS — C07 Malignant neoplasm of parotid gland: Secondary | ICD-10-CM | POA: Insufficient documentation

## 2013-12-06 DIAGNOSIS — R439 Unspecified disturbances of smell and taste: Secondary | ICD-10-CM | POA: Insufficient documentation

## 2013-12-06 DIAGNOSIS — I10 Essential (primary) hypertension: Secondary | ICD-10-CM | POA: Insufficient documentation

## 2013-12-06 LAB — CBC WITH DIFFERENTIAL/PLATELET
Basophils Absolute: 0 10*3/uL (ref 0.0–0.1)
Basophils Relative: 1 % (ref 0–1)
EOS PCT: 8 % — AB (ref 0–5)
Eosinophils Absolute: 0.4 10*3/uL (ref 0.0–0.7)
HEMATOCRIT: 42.3 % (ref 39.0–52.0)
Hemoglobin: 14.8 g/dL (ref 13.0–17.0)
Lymphocytes Relative: 23 % (ref 12–46)
Lymphs Abs: 1.3 10*3/uL (ref 0.7–4.0)
MCH: 33.6 pg (ref 26.0–34.0)
MCHC: 35 g/dL (ref 30.0–36.0)
MCV: 96.1 fL (ref 78.0–100.0)
MONO ABS: 0.7 10*3/uL (ref 0.1–1.0)
Monocytes Relative: 12 % (ref 3–12)
Neutro Abs: 3.1 10*3/uL (ref 1.7–7.7)
Neutrophils Relative %: 57 % (ref 43–77)
Platelets: 176 10*3/uL (ref 150–400)
RBC: 4.4 MIL/uL (ref 4.22–5.81)
RDW: 14 % (ref 11.5–15.5)
WBC: 5.5 10*3/uL (ref 4.0–10.5)

## 2013-12-06 LAB — COMPREHENSIVE METABOLIC PANEL
ALT: 15 U/L (ref 0–53)
AST: 24 U/L (ref 0–37)
Albumin: 3.5 g/dL (ref 3.5–5.2)
Alkaline Phosphatase: 66 U/L (ref 39–117)
BUN: 25 mg/dL — AB (ref 6–23)
CALCIUM: 8.9 mg/dL (ref 8.4–10.5)
CO2: 28 mEq/L (ref 19–32)
CREATININE: 0.97 mg/dL (ref 0.50–1.35)
Chloride: 103 mEq/L (ref 96–112)
GFR, EST AFRICAN AMERICAN: 85 mL/min — AB (ref >90)
GFR, EST NON AFRICAN AMERICAN: 74 mL/min — AB (ref >90)
GLUCOSE: 106 mg/dL — AB (ref 70–99)
Potassium: 4.5 mEq/L (ref 3.7–5.3)
Sodium: 142 mEq/L (ref 137–147)
Total Bilirubin: 1 mg/dL (ref 0.3–1.2)
Total Protein: 7 g/dL (ref 6.0–8.3)

## 2013-12-06 LAB — LACTATE DEHYDROGENASE: LDH: 174 U/L (ref 94–250)

## 2013-12-06 LAB — D-DIMER, QUANTITATIVE (NOT AT ARMC): D DIMER QUANT: 0.39 ug{FEU}/mL (ref 0.00–0.48)

## 2013-12-06 MED ORDER — IOHEXOL 300 MG/ML  SOLN
75.0000 mL | Freq: Once | INTRAMUSCULAR | Status: AC | PRN
  Administered 2013-12-06: 75 mL via INTRAVENOUS

## 2013-12-06 NOTE — Progress Notes (Signed)
Labs drawn today for cbc/diff,dimer,ldh,cmp

## 2013-12-08 ENCOUNTER — Ambulatory Visit (HOSPITAL_COMMUNITY): Payer: Medicare Other

## 2013-12-09 NOTE — Progress Notes (Signed)
This encounter was created in error - please disregard.

## 2013-12-17 ENCOUNTER — Encounter (HOSPITAL_COMMUNITY): Payer: Medicare Other

## 2013-12-17 ENCOUNTER — Encounter (HOSPITAL_COMMUNITY): Payer: Self-pay

## 2013-12-17 ENCOUNTER — Encounter (HOSPITAL_BASED_OUTPATIENT_CLINIC_OR_DEPARTMENT_OTHER): Payer: Medicare Other

## 2013-12-17 VITALS — BP 155/98 | HR 65 | Temp 97.8°F | Resp 18 | Wt 148.2 lb

## 2013-12-17 DIAGNOSIS — R439 Unspecified disturbances of smell and taste: Secondary | ICD-10-CM

## 2013-12-17 DIAGNOSIS — C07 Malignant neoplasm of parotid gland: Secondary | ICD-10-CM

## 2013-12-17 NOTE — Patient Instructions (Signed)
[facility] Discharge Instructions  RECOMMENDATIONS MADE BY THE CONSULTANT AND ANY TEST RESULTS WILL BE SENT TO YOUR REFERRING PHYSICIAN.  EXAM FINDINGS BY THE PHYSICIAN TODAY AND SIGNS OR SYMPTOMS TO REPORT TO CLINIC OR PRIMARY PHYSICIAN: Exam and findings as discussed by Dr. Barnet Glasgow.  MEDICATIONS PRESCRIBED:  1.  Zinc 50mg  twice daily  INSTRUCTIONS/FOLLOW-UP: 1.  Return in July for labs and office visit as scheduled. 2.  Keep your appointment with Dr. Orlene Erm as scheduled.  Thank you for choosing [facility] to provide your oncology and hematology care.  To afford each patient quality time with our providers, please arrive at least 15 minutes before your scheduled appointment time.  With your help, our goal is to use those 15 minutes to complete the necessary work-up to ensure our physicians have the information they need to help with your evaluation and healthcare recommendations.    Effective January 1st, 2014, we ask that you re-schedule your appointment with our physicians should you arrive 10 or more minutes late for your appointment.  We strive to give you quality time with our providers, and arriving late affects you and other patients whose appointments are after yours.    Again, thank you for choosing [facility].  Our hope is that these requests will decrease the amount of time that you wait before being seen by our physicians.       _____________________________________________________________  Should you have questions after your visit to [facility], please contact our office at [phone] between the hours of 8:30 a.m. and 5:00 p.m.  Voicemails left after 4:30 p.m. will not be returned until the following business day.  For prescription refill requests, have your pharmacy contact our office with your prescription refill request.

## 2013-12-17 NOTE — Progress Notes (Signed)
Fernando [name]  OFFICE PROGRESS NOTE  Fernando [place], MD [place] [address]  DIAGNOSIS: Squamous cell carcinoma of parotid - Plan: CBC with Differential, Reticulocytes, Comprehensive metabolic panel, Lactate dehydrogenase  Chief Complaint  Patient presents with  . Follow-up  . parotid gland cancer    CURRENT THERAPY: Watchful expectation  INTERVAL HISTORY: Fernando [name] 80 y.o. male returns for followup of carcinoma of the parotid gland, status post resection with positive margins treated with postop radiotherapy with additional surgery on 10/28/2012.  His primary complaint is dysgeusia without significant xerostomia. Appetite is good with no nausea, vomiting, lower extremity swelling or redness, cough, wheezing, PND, orthopnea, palpitations, abdominal pain, melena, hematochezia, hematuria, incontinence but with one episode of nocturia each night. He denies any fever, night sweats, headache, or seizures.  MEDICAL HISTORY: Past Medical History  Diagnosis Date  . Carotid artery disease     Dr. Kellie Simmering - s/p left CEA  . Hyperlipidemia   . Essential hypertension, benign   . Arthritis   . Atrial fibrillation   . Tachycardia-bradycardia syndrome   . 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 This mass was initially felt to be 1.7 cm at the time of presentation but had enlarged significantly to 4 cm clinically at the time of surgery. Pathologically it was 3.4 cm. A lymph node deep to the parotid was enlarged clinically at the time of surgery but was negative     INTERIM HISTORY: has Carotid artery stenosis, asymptomatic; Atrial fibrillation; Essential hypertension, benign; Tachy-brady syndrome; Pacemaker-St.Jude; and Squamous cell carcinoma of parotid on his problem list.   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 Orlene Erm  ALLERGIES:  has No Known Allergies.  MEDICATIONS: has a current medication list which includes the following prescription(s): aripiprazole, atorvastatin, glucosamine hcl, losartan, metoprolol tartrate, naproxen sodium, and rivaroxaban.  SURGICAL HISTORY:  Past Surgical History  Procedure Laterality Date  . Carotid endarterectomy    . Cataract extraction w/ intraocular lens implant      Bilateral  . Pacemaker insertion  10-01-2012  . Insert / replace / remove pacemaker    . Parathyroidectomy  10/28/2012  . Parotidectomy  10/28/2012    Procedure: PAROTIDECTOMY;  Surgeon: Ascencion Dike, MD;  Location: [facility];  Service: ENT;  Laterality: Right;  Total Parotidectomy    FAMILY HISTORY: family history includes Cancer in his brother and sister; Cirrhosis in his mother; Heart attack in his sister; Heart disease in his sister.  SOCIAL HISTORY:  reports that he quit smoking about 41 years ago. His smoking use included Cigarettes. He smoked 0.00 packs per day. He does not have any smokeless tobacco history on file. He reports that he drinks about 0.6 ounces of alcohol per week. He reports that he does not use illicit drugs.  REVIEW OF SYSTEMS:  Other than that discussed above is noncontributory.  PHYSICAL EXAMINATION: ECOG PERFORMANCE STATUS: 1 - Symptomatic but completely ambulatory  Blood pressure 155/98, pulse 65, temperature 97.8 F (36.6 C), temperature source Oral, resp. rate 18, weight 148 lb 3.2 oz (67.223 kg).  GENERAL:alert, no distress and comfortable. Male pattern baldness. Regrowth of hair at the base of the skull posteriorly is dark compared to his otherwise white hair! SKIN:  skin color, texture, turgor are normal, no rashes or significant lesions EYES: PERLA; Conjunctiva are pink and non-injected, sclera clear OROPHARYNX:no exudate, no  erythema on lips, buccal mucosa, or tongue. No evidence of hemorrhage. NECK: supple, thyroid normal size, non-tender, without nodularity. No masses. Somewhat ligneous to palpation. No lymph nodes palpable. CHEST: Normal AP diameter with no gynecomastia. LYMPH:  no palpable lymphadenopathy in the cervical, axillary or inguinal LUNGS: clear to auscultation and percussion with normal breathing effort HEART: regular rate & rhythm and no murmurs. ABDOMEN:abdomen soft, non-tender and normal bowel sounds MUSCULOSKELETAL:no cyanosis of digits and no clubbing. Range of motion normal.  NEURO: alert & oriented x 3 with fluent speech, no focal motor/sensory deficits.   LABORATORY DATA: Infusion on 12/06/2013  Component Date Value Range Status  . WBC 12/06/2013 5.5  4.0 - 10.5 K/uL Final  . RBC 12/06/2013 4.40  4.22 - 5.81 MIL/uL Final  . Hemoglobin 12/06/2013 14.8  13.0 - 17.0 g/dL Final  . HCT 12/06/2013 42.3  39.0 - 52.0 % Final  . MCV 12/06/2013 96.1  78.0 - 100.0 fL Final  . MCH 12/06/2013 33.6  26.0 - 34.0 pg Final  . MCHC 12/06/2013 35.0  30.0 - 36.0 g/dL Final  . RDW 12/06/2013 14.0  11.5 - 15.5 % Final  . Platelets 12/06/2013 176  150 - 400 K/uL Final  . Neutrophils Relative % 12/06/2013 57  43 - 77 % Final  . Neutro Abs 12/06/2013 3.1  1.7 - 7.7 K/uL Final  . Lymphocytes Relative 12/06/2013 23  12 - 46 % Final  . Lymphs Abs 12/06/2013 1.3  0.7 - 4.0 K/uL Final  . Monocytes Relative 12/06/2013 12  3 - 12 % Final  . Monocytes Absolute 12/06/2013 0.7  0.1 - 1.0 K/uL Final  . Eosinophils Relative 12/06/2013 8* 0 - 5 % Final  . Eosinophils Absolute 12/06/2013 0.4  0.0 - 0.7 K/uL Final  . Basophils Relative 12/06/2013 1  0 - 1 % Final  . Basophils Absolute 12/06/2013 0.0  0.0 - 0.1 K/uL Final  . Sodium 12/06/2013 142  137 - 147 mEq/L Final  . Potassium 12/06/2013 4.5  3.7 - 5.3 mEq/L Final  . Chloride 12/06/2013 103  96 - 112 mEq/L Final  . CO2 12/06/2013 28  19 - 32 mEq/L Final  .  Glucose, Bld 12/06/2013 106* 70 - 99 mg/dL Final  . BUN 12/06/2013 25* 6 - 23 mg/dL Final  . Creatinine, Ser 12/06/2013 0.97  0.50 - 1.35 mg/dL Final  . Calcium 12/06/2013 8.9  8.4 - 10.5 mg/dL Final  . Total Protein 12/06/2013 7.0  6.0 - 8.3 g/dL Final  . Albumin 12/06/2013 3.5  3.5 - 5.2 g/dL Final  . AST 12/06/2013 24  0 - 37 U/L Final  . ALT 12/06/2013 15  0 - 53 U/L Final  . Alkaline Phosphatase 12/06/2013 66  39 - 117 U/L Final  . Total Bilirubin 12/06/2013 1.0  0.3 - 1.2 mg/dL Final  . GFR calc non Af Amer 12/06/2013 74* >90 mL/min Final  . GFR calc Af Amer 12/06/2013 85* >90 mL/min Final   Comment: (NOTE)                          The eGFR has been calculated using the CKD EPI equation.                          This calculation  has not been validated in all clinical situations.                          eGFR's persistently <90 mL/min signify possible Chronic Kidney                          Disease.  Marland Kitchen D-Dimer, Quant 12/06/2013 0.39  0.00 - 0.48 ug/mL-FEU Final   Comment:                                 AT THE INHOUSE ESTABLISHED CUTOFF                          VALUE OF 0.48 ug/mL FEU,                          THIS ASSAY HAS BEEN DOCUMENTED                          IN THE LITERATURE TO HAVE                          A SENSITIVITY AND NEGATIVE                          PREDICTIVE VALUE OF AT LEAST                          98 TO 99%.  THE TEST RESULT                          SHOULD BE CORRELATED WITH                          AN ASSESSMENT OF THE CLINICAL                          PROBABILITY OF DVT / VTE.  . LDH 12/06/2013 174  94 - 250 U/L Final    PATHOLOGY: No new pathology.  Urinalysis    Component Value Date/Time   COLORURINE YELLOW 12/21/2007 1618   APPEARANCEUR CLEAR 12/21/2007 1618   LABSPEC 1.023 12/21/2007 1618   PHURINE 5.0 12/21/2007 1618   GLUCOSEU NEGATIVE 12/21/2007 1618   HGBUR NEGATIVE 12/21/2007 1618   BILIRUBINUR NEGATIVE 12/21/2007 1618   KETONESUR 15*  12/21/2007 1618   PROTEINUR NEGATIVE 12/21/2007 1618   UROBILINOGEN 0.2 12/21/2007 1618   NITRITE NEGATIVE 12/21/2007 1618   LEUKOCYTESUR NEGATIVE MICROSCOPIC NOT DONE ON URINES WITH NEGATIVE PROTEIN, BLOOD, LEUKOCYTES, NITRITE, OR GLUCOSE <1000 mg/dL. 12/21/2007 1618    RADIOGRAPHIC STUDIES: Ct Soft Tissue Neck W Contrast  12/06/2013   CLINICAL DATA:  Follow up squamous cell carcinoma of the parotid gland. Post resection and post resection radiation therapy.  EXAM: CT NECK WITH CONTRAST  TECHNIQUE: Multidetector CT imaging of the neck was performed using the standard protocol following the bolus administration of intravenous contrast.  CONTRAST:  23m OMNIPAQUE IOHEXOL 300 MG/ML  SOLN  COMPARISON:  06/02/2013.  FINDINGS: Similar post therapy appearance of the right parotid gland. Continued radiographic surveillance recommended.  Similar appearance of mild thickening of  the epiglottis and slightly asymmetric appearance of the area epiglottic bulge/piriform sinus most likely representing post therapy changes.  No adenopathy. Scattered tiny lymph nodes unchanged including slightly rounded lymph nodes left level 2-3 region.  Prominent atherosclerotic type changes carotid bifurcation. Appearance of prior left carotid endarterectomy. Greater than 90% narrowing of the proximal right internal carotid artery once again noted.  No significant change dominant left thyroid 1.7 x 1.2 cm low-density lesion.  Scarring lung apices.  Prominent degenerative changes cervical spine and sternoclavicular region.  Mucosal thickening left maxillary sinus.  Partial opacification right mastoid air cells. No obstructing lesion noted at the level of the right posterior superior nasopharynx to indicate cause of eustachian tube dysfunction.  Minimal sphenoid sinus mucosal thickening.  Pacemaker.  IMPRESSION: Similar post therapy appearance of the right parotid gland. Continued radiographic surveillance recommended.  Similar appearance of  mild thickening of the epiglottis and slightly asymmetric appearance of the area epiglottic bulge/piriform sinus most likely representing post therapy changes.  No adenopathy. Scattered tiny lymph nodes unchanged including slightly rounded lymph nodes left level 2-3 region.  Prominent atherosclerotic type changes carotid bifurcation. Appearance of prior left carotid endarterectomy. Greater than 90% narrowing of the proximal right internal carotid artery once again noted.  No significant change dominant left thyroid 1.7 x 1.2 cm low-density lesion   Electronically Signed   By: Chauncey Cruel M.D.   On: 12/06/2013 12:02    ASSESSMENT:  #1. 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 #2. Dysgeusia without significant xerostomia. #3. Post-treatment induration of the neck due to radiation.   PLAN:  #1. Suggesting supplements 50 mg twice a day to help with dysgeusia. #2. Followup with Dr. Orlene Erm in April 2015. #3. Followup here in July 2015. CBC and chem profile with LDH at that time.   All questions were answered. The patient knows to call the clinic with any problems, questions or concerns. We can certainly see the patient much sooner if necessary.   I spent 25 minutes counseling the patient face to face. The total time spent in the appointment was 30 minutes.    Doroteo Bradford, MD 12/17/2013 9:56 AM

## 2013-12-29 ENCOUNTER — Other Ambulatory Visit: Payer: Self-pay | Admitting: Internal Medicine

## 2013-12-30 ENCOUNTER — Telehealth: Payer: Self-pay | Admitting: Internal Medicine

## 2013-12-30 ENCOUNTER — Ambulatory Visit (INDEPENDENT_AMBULATORY_CARE_PROVIDER_SITE_OTHER): Payer: Medicare Other | Admitting: *Deleted

## 2013-12-30 DIAGNOSIS — I495 Sick sinus syndrome: Secondary | ICD-10-CM

## 2013-12-30 DIAGNOSIS — I4891 Unspecified atrial fibrillation: Secondary | ICD-10-CM

## 2013-12-30 NOTE — Telephone Encounter (Signed)
New Problem:  Pt's daughter, Lynelle Smoke, is requesting a call back to help her with the remote transmission process.

## 2013-12-31 NOTE — Telephone Encounter (Signed)
Updated Fernando [name] that a transmission was received on 12/30/13 automatically. Future transmissions will also occur automatically.

## 2014-01-04 LAB — MDC_IDC_ENUM_SESS_TYPE_REMOTE
Battery Voltage: 2.92 V
Brady Statistic AP VP Percent: 0 %
Brady Statistic AP VS Percent: 0 %
Brady Statistic RA Percent Paced: 0 %
Date Time Interrogation Session: 20150205081425
Implantable Pulse Generator Serial Number: 7411696
Lead Channel Impedance Value: 440 Ohm
Lead Channel Pacing Threshold Amplitude: 0.75 V
Lead Channel Pacing Threshold Pulse Width: 0.4 ms
Lead Channel Sensing Intrinsic Amplitude: 12 mV
Lead Channel Setting Pacing Amplitude: 2.5 V
Lead Channel Setting Pacing Amplitude: 3.5 V
Lead Channel Setting Sensing Sensitivity: 2 mV
MDC IDC MSMT BATTERY REMAINING LONGEVITY: 62 mo
MDC IDC MSMT LEADCHNL RA IMPEDANCE VALUE: 400 Ohm
MDC IDC MSMT LEADCHNL RA SENSING INTR AMPL: 0.3 mV
MDC IDC SET LEADCHNL RV PACING PULSEWIDTH: 0.4 ms
MDC IDC STAT BRADY AS VP PERCENT: 79 %
MDC IDC STAT BRADY AS VS PERCENT: 21 %
MDC IDC STAT BRADY RV PERCENT PACED: 79 %

## 2014-01-18 ENCOUNTER — Encounter: Payer: Self-pay | Admitting: *Deleted

## 2014-01-26 ENCOUNTER — Encounter: Payer: Self-pay | Admitting: Internal Medicine

## 2014-04-04 ENCOUNTER — Ambulatory Visit (INDEPENDENT_AMBULATORY_CARE_PROVIDER_SITE_OTHER): Payer: Medicare Other | Admitting: *Deleted

## 2014-04-04 ENCOUNTER — Encounter: Payer: Self-pay | Admitting: Internal Medicine

## 2014-04-04 DIAGNOSIS — I4891 Unspecified atrial fibrillation: Secondary | ICD-10-CM

## 2014-04-04 DIAGNOSIS — I495 Sick sinus syndrome: Secondary | ICD-10-CM

## 2014-04-04 LAB — MDC_IDC_ENUM_SESS_TYPE_REMOTE
Battery Remaining Longevity: 63 mo
Battery Voltage: 2.92 V
Brady Statistic AP VP Percent: 0 %
Brady Statistic AS VP Percent: 81 %
Brady Statistic AS VS Percent: 19 %
Brady Statistic RV Percent Paced: 81 %
Date Time Interrogation Session: 20150511064828
Lead Channel Impedance Value: 410 Ohm
Lead Channel Pacing Threshold Amplitude: 0.75 V
Lead Channel Sensing Intrinsic Amplitude: 0.3 mV
Lead Channel Setting Pacing Amplitude: 2.5 V
Lead Channel Setting Pacing Pulse Width: 0.4 ms
MDC IDC MSMT LEADCHNL RV IMPEDANCE VALUE: 440 Ohm
MDC IDC MSMT LEADCHNL RV PACING THRESHOLD PULSEWIDTH: 0.4 ms
MDC IDC MSMT LEADCHNL RV SENSING INTR AMPL: 12 mV
MDC IDC PG SERIAL: 7411696
MDC IDC SET LEADCHNL RA PACING AMPLITUDE: 3.5 V
MDC IDC SET LEADCHNL RV SENSING SENSITIVITY: 2 mV
MDC IDC STAT BRADY AP VS PERCENT: 1 %
MDC IDC STAT BRADY RA PERCENT PACED: 1 %

## 2014-04-04 NOTE — Progress Notes (Signed)
Remote pacemaker transmission.   

## 2014-04-05 ENCOUNTER — Telehealth: Payer: Self-pay | Admitting: Internal Medicine

## 2014-04-05 NOTE — Telephone Encounter (Signed)
Spoke w/daughter to let know transmission received.

## 2014-04-05 NOTE — Telephone Encounter (Signed)
New message     Did we get the pacer transmission report.  OK to leave a vm message

## 2014-04-05 NOTE — Telephone Encounter (Signed)
Follow Up  Calling for Pacer Transmission report.. pleased call/ SR

## 2014-04-22 ENCOUNTER — Encounter: Payer: Self-pay | Admitting: Cardiology

## 2014-06-14 ENCOUNTER — Encounter (HOSPITAL_COMMUNITY): Payer: Medicare Other | Attending: Hematology and Oncology

## 2014-06-14 ENCOUNTER — Telehealth (HOSPITAL_COMMUNITY): Payer: Self-pay | Admitting: Emergency Medicine

## 2014-06-14 DIAGNOSIS — Z7901 Long term (current) use of anticoagulants: Secondary | ICD-10-CM | POA: Insufficient documentation

## 2014-06-14 DIAGNOSIS — I4891 Unspecified atrial fibrillation: Secondary | ICD-10-CM | POA: Diagnosis not present

## 2014-06-14 DIAGNOSIS — E785 Hyperlipidemia, unspecified: Secondary | ICD-10-CM | POA: Insufficient documentation

## 2014-06-14 DIAGNOSIS — Z950 Presence of cardiac pacemaker: Secondary | ICD-10-CM | POA: Insufficient documentation

## 2014-06-14 DIAGNOSIS — I10 Essential (primary) hypertension: Secondary | ICD-10-CM | POA: Insufficient documentation

## 2014-06-14 DIAGNOSIS — Z85819 Personal history of malignant neoplasm of unspecified site of lip, oral cavity, and pharynx: Secondary | ICD-10-CM | POA: Insufficient documentation

## 2014-06-14 DIAGNOSIS — C07 Malignant neoplasm of parotid gland: Secondary | ICD-10-CM

## 2014-06-14 DIAGNOSIS — Z923 Personal history of irradiation: Secondary | ICD-10-CM | POA: Diagnosis not present

## 2014-06-14 LAB — COMPREHENSIVE METABOLIC PANEL
ALT: 16 U/L (ref 0–53)
ANION GAP: 11 (ref 5–15)
AST: 24 U/L (ref 0–37)
Albumin: 3.7 g/dL (ref 3.5–5.2)
Alkaline Phosphatase: 64 U/L (ref 39–117)
BUN: 24 mg/dL — AB (ref 6–23)
CALCIUM: 8.8 mg/dL (ref 8.4–10.5)
CO2: 26 mEq/L (ref 19–32)
Chloride: 107 mEq/L (ref 96–112)
Creatinine, Ser: 0.96 mg/dL (ref 0.50–1.35)
GFR calc Af Amer: 86 mL/min — ABNORMAL LOW (ref >90)
GFR calc non Af Amer: 74 mL/min — ABNORMAL LOW (ref >90)
GLUCOSE: 108 mg/dL — AB (ref 70–99)
Potassium: 4.7 mEq/L (ref 3.7–5.3)
Sodium: 144 mEq/L (ref 137–147)
TOTAL PROTEIN: 7.3 g/dL (ref 6.0–8.3)
Total Bilirubin: 0.9 mg/dL (ref 0.3–1.2)

## 2014-06-14 LAB — RETICULOCYTES
RBC.: 4.58 MIL/uL (ref 4.22–5.81)
RETIC CT PCT: 1.2 % (ref 0.4–3.1)
Retic Count, Absolute: 55 10*3/uL (ref 19.0–186.0)

## 2014-06-14 LAB — CBC WITH DIFFERENTIAL/PLATELET
Basophils Absolute: 0 10*3/uL (ref 0.0–0.1)
Basophils Relative: 1 % (ref 0–1)
EOS ABS: 0.5 10*3/uL (ref 0.0–0.7)
EOS PCT: 9 % — AB (ref 0–5)
HCT: 43.8 % (ref 39.0–52.0)
Hemoglobin: 14.8 g/dL (ref 13.0–17.0)
Lymphocytes Relative: 27 % (ref 12–46)
Lymphs Abs: 1.6 10*3/uL (ref 0.7–4.0)
MCH: 32.3 pg (ref 26.0–34.0)
MCHC: 33.8 g/dL (ref 30.0–36.0)
MCV: 95.6 fL (ref 78.0–100.0)
Monocytes Absolute: 0.8 10*3/uL (ref 0.1–1.0)
Monocytes Relative: 14 % — ABNORMAL HIGH (ref 3–12)
NEUTROS PCT: 51 % (ref 43–77)
Neutro Abs: 3 10*3/uL (ref 1.7–7.7)
PLATELETS: 167 10*3/uL (ref 150–400)
RBC: 4.58 MIL/uL (ref 4.22–5.81)
RDW: 15.2 % (ref 11.5–15.5)
WBC: 5.9 10*3/uL (ref 4.0–10.5)

## 2014-06-14 LAB — LACTATE DEHYDROGENASE: LDH: 197 U/L (ref 94–250)

## 2014-06-14 NOTE — Telephone Encounter (Signed)
Message copied by Elenor Legato on Tue Jun 14, 2014  4:34 PM ------      Message from: [facility]      Created: Tue Jun 14, 2014  2:10 PM       Stable.  Encourage increase PO fluids, mildly dehydrated ------

## 2014-06-14 NOTE — Progress Notes (Signed)
LABS DRAWN FOR CBCD, RETIC,LDH,CMP

## 2014-06-16 ENCOUNTER — Other Ambulatory Visit (HOSPITAL_COMMUNITY): Payer: Medicare Other

## 2014-06-16 ENCOUNTER — Encounter (HOSPITAL_BASED_OUTPATIENT_CLINIC_OR_DEPARTMENT_OTHER): Payer: Medicare Other

## 2014-06-16 VITALS — BP 169/75 | HR 59 | Temp 97.7°F | Resp 20 | Wt 151.1 lb

## 2014-06-16 DIAGNOSIS — C07 Malignant neoplasm of parotid gland: Secondary | ICD-10-CM

## 2014-06-16 DIAGNOSIS — Z85819 Personal history of malignant neoplasm of unspecified site of lip, oral cavity, and pharynx: Secondary | ICD-10-CM | POA: Diagnosis not present

## 2014-06-16 LAB — T4: T4, Total: 8.9 ug/dL (ref 5.0–12.5)

## 2014-06-16 LAB — TSH: TSH: 4.21 u[IU]/mL (ref 0.350–4.500)

## 2014-06-16 LAB — T3, FREE: T3, Free: 3.6 pg/mL (ref 2.3–4.2)

## 2014-06-16 NOTE — Patient Instructions (Signed)
[place] Discharge Instructions  RECOMMENDATIONS MADE BY THE CONSULTANT AND ANY TEST RESULTS WILL BE SENT TO YOUR REFERRING PHYSICIAN.  EXAM FINDINGS BY THE PHYSICIAN TODAY AND SIGNS OR SYMPTOMS TO REPORT TO CLINIC OR PRIMARY PHYSICIAN:   CT neck/chest next Thursday July 30 @  8am  We are to call with results  BUT if we don't call, please call us for results.   If CT is clear, return in 6 months to see MD: Dr. Barnet Glasgow @ 11/10/14 @ 8:30 Thursday  Today we are drawing labs or adding them on to the blood drawn prior to your visit today and include: free T3, T4, TSH    Thank you for choosing [facility] to provide your oncology and hematology care.  To afford each patient quality time with our providers, please arrive at least 15 minutes before your scheduled appointment time.  With your help, our goal is to use those 15 minutes to complete the necessary work-up to ensure our physicians have the information they need to help with your evaluation and healthcare recommendations.    Effective January 1st, 2014, we ask that you re-schedule your appointment with our physicians should you arrive 10 or more minutes late for your appointment.  We strive to give you quality time with our providers, and arriving late affects you and other patients whose appointments are after yours.    Again, thank you for choosing [facility].  Our hope is that these requests will decrease the amount of time that you wait before being seen by our physicians.       _____________________________________________________________  Should you have questions after your visit to [facility], please contact our office at [phone] between the hours of 8:30 a.m. and 5:00 p.m.  Voicemails left after 4:30 p.m. will not be returned until the following business day.  For prescription refill requests, have your pharmacy contact our office with your prescription refill  request.

## 2014-06-16 NOTE — Progress Notes (Signed)
[facility] OFFICE PROGRESS NOTE  PCP Lanette Hampshire, MD [place] [address]  DIAGNOSIS: Squamous cell carcinoma of parotid (Right)  CURRENT THERAPY: Watchful expectation  INTERVAL HEMATOLOGY/ONCOLOGY HX: Fernando [name] 80 y.o. male returns for followup of carcinoma of the parotid gland, status post resection with positive margins treated with postop radiotherapy with additional surgery on 10/28/2012. His primary complaint is difficulty with the act of swallowing solid food, primarily meat, from his tongue to the back of his throat. He denies dysphagia. He drinks fluids liberally without complaining of xerostomia. Appetite is good with no nausea, vomiting, lower extremity swelling or redness, cough, wheezing, PND, orthopnea, palpitations, abdominal pain, melena, hematochezia, hematuria, incontinence but with one episode of nocturia each night. He denies any fever, night sweats, headache, or seizures. Currently using a knee brace after suffering a fall.   MEDICAL HISTORY:  Past Medical History  Diagnosis Date  . Carotid artery disease     Dr. Kellie Simmering - s/p left CEA  . Hyperlipidemia   . Essential hypertension, benign   . Arthritis   . Atrial fibrillation   . Tachycardia-bradycardia syndrome   . 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 This mass was initially felt to be 1.7 cm at the time of presentation but had enlarged significantly to 4 cm clinically at the time of surgery. Pathologically it was 3.4 cm. A lymph node deep to the parotid was enlarged clinically at the time of surgery but was negative     has Carotid artery stenosis, asymptomatic; Atrial fibrillation; Essential hypertension, benign; Tachy-brady syndrome; Pacemaker-St.Jude; and Squamous cell carcinoma of parotid on his problem list.      SURGICAL HISTORY:  Past Surgical History   Procedure  Laterality  Date   .  Carotid endarterectomy     .  Cataract extraction w/ intraocular lens implant       Bilateral   .  Pacemaker insertion   10-01-2012   .  Insert / replace / remove pacemaker     .  Parathyroidectomy   10/28/2012   .  Parotidectomy   10/28/2012     Procedure: PAROTIDECTOMY; Surgeon: Ascencion Dike, MD; Location: [facility]; Service: ENT; Laterality: Right; Total Parotidectomy      ALLERGIES:  has No Known Allergies.  MEDICATIONS: has a current medication list which includes the following prescription(s): aripiprazole, atorvastatin, losartan, metoprolol tartrate, naproxen sodium, xarelto, and glucosamine hcl.  FAMILY HISTORY: family history includes Cancer in his brother and sister; Cirrhosis in his mother; Heart attack in his sister; Heart disease in his sister.  REVIEW OF SYSTEMS:    SINCE YOUR LAST VISIT Been diagnosed or treated for a new medical /surgical  problem or condition: No Any Recent Xrays or studies performed: No Any new prescription or OTC medications: No ECOG Perf Status: Restricted in physically strenuous activity but ambulatory and able to carry out work of a light or sedentary nature, e.g., light house work, office work Problems sleeping: No Medications taken to help sleep: No How is your appetie: 25% normal (eats breakfast with ease, struggles to eat dinner) Any Supplements: Yes (boost/ensure) Any trouble chewing or swallowing: Yes - Solids Any Nausea or Vomiting: No Any Bowel problems: No # Bowel Movements per week: 7 Any Urinary Issues: No Any Cardiac Problems: Yes (pacemaker) Any Respiratory Issues: No Any Neurological Issues:  Yes (dizziness upon moving/changing positions too quickly) Do you live alone: No Feelings hopelessness: No You or your family have any concerns or Health changes: No (none other than the swallowing issues) Pain Assessment Pain Score: 0-No pain  Other than that  discussed above is noncontributory.    PHYSICAL EXAMINATION:   weight is 151 lb 1.6 oz (68.539 kg). His oral temperature is 97.7 F (36.5 C). His blood pressure is 169/75 and his pulse is 59. His respiration is 20.    GENERAL: alert, no distress and comfortable SKIN: skin color, texture, turgor are normal, no rashes or significant lesions. No cyanosis of digits and no clubbing.  EYES: PERRL; Conjunctiva are pink and non-injected, sclera clear OROPHARYNX: no exudate, no erythema on lips, buccal mucosa, or tongue. NECK: supple, thyroid normal size, non-tender, without nodularity. No masses LYMPH:  no palpable lymphadenopathy in the cervical, axillary or inguinal LUNGS: clear to auscultation and percussion with normal breathing effort  HEART: regular rate & rhythm and no murmurs.  ABDOMEN: abdomen soft, non-tender and normal bowel sounds,  MUSCULOSKELETAL/EXTREMITIES: No spine or CVA tenderness. No peripheral edema.  NEURO: alert & oriented x 3 without slurred speech. no focal motor/sensory deficits   LABORATORY DATA: Lab on 06/14/2014  Component Date Value Ref Range Status  . WBC 06/14/2014 5.9  4.0 - 10.5 K/uL Final  . RBC 06/14/2014 4.58  4.22 - 5.81 MIL/uL Final  . Hemoglobin 06/14/2014 14.8  13.0 - 17.0 g/dL Final  . HCT 06/14/2014 43.8  39.0 - 52.0 % Final  . MCV 06/14/2014 95.6  78.0 - 100.0 fL Final  . MCH 06/14/2014 32.3  26.0 - 34.0 pg Final  . MCHC 06/14/2014 33.8  30.0 - 36.0 g/dL Final  . RDW 06/14/2014 15.2  11.5 - 15.5 % Final  . Platelets 06/14/2014 167  150 - 400 K/uL Final  . Neutrophils Relative % 06/14/2014 51  43 - 77 % Final  . Neutro Abs 06/14/2014 3.0  1.7 - 7.7 K/uL Final  . Lymphocytes Relative 06/14/2014 27  12 - 46 % Final  . Lymphs Abs 06/14/2014 1.6  0.7 - 4.0 K/uL Final  . Monocytes Relative 06/14/2014 14* 3 - 12 % Final  . Monocytes Absolute 06/14/2014 0.8  0.1 - 1.0 K/uL Final  . Eosinophils Relative 06/14/2014 9* 0 - 5 % Final  . Eosinophils  Absolute 06/14/2014 0.5  0.0 - 0.7 K/uL Final  . Basophils Relative 06/14/2014 1  0 - 1 % Final  . Basophils Absolute 06/14/2014 0.0  0.0 - 0.1 K/uL Final  . Retic Ct Pct 06/14/2014 1.2  0.4 - 3.1 % Final  . RBC. 06/14/2014 4.58  4.22 - 5.81 MIL/uL Final  . Retic Count, Manual 06/14/2014 55.0  19.0 - 186.0 K/uL Final  . Sodium 06/14/2014 144  137 - 147 mEq/L Final  . Potassium 06/14/2014 4.7  3.7 - 5.3 mEq/L Final  . Chloride 06/14/2014 107  96 - 112 mEq/L Final  . CO2 06/14/2014 26  19 - 32 mEq/L Final  . Glucose, Bld 06/14/2014 108* 70 - 99 mg/dL Final  . BUN 06/14/2014 24* 6 - 23 mg/dL Final  . Creatinine, Ser 06/14/2014 0.96  0.50 - 1.35 mg/dL Final  . Calcium 06/14/2014 8.8  8.4 - 10.5 mg/dL Final  . Total Protein 06/14/2014 7.3  6.0 - 8.3 g/dL Final  . Albumin 06/14/2014 3.7  3.5 - 5.2 g/dL Final  . AST 06/14/2014 24  0 - 37 U/L Final  .  ALT 06/14/2014 16  0 - 53 U/L Final  . Alkaline Phosphatase 06/14/2014 64  39 - 117 U/L Final  . Total Bilirubin 06/14/2014 0.9  0.3 - 1.2 mg/dL Final  . GFR calc non Af Amer 06/14/2014 74* >90 mL/min Final  . GFR calc Af Amer 06/14/2014 86* >90 mL/min Final   Comment: (NOTE)                          The eGFR has been calculated using the CKD EPI equation.                          This calculation has not been validated in all clinical situations.                          eGFR's persistently <90 mL/min signify possible Chronic Kidney                          Disease.  . Anion gap 06/14/2014 11  5 - 15 Final  . LDH 06/14/2014 197  94 - 250 U/L Final     RADIOGRAPHIC STUDIES: No new results found.  ASSESSMENT:  1. 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   RECOMMENDATIONS:  1. Schedule repeat CT scan of the Neck as well as a Chest CT. If there are no significant changes, the patient can followup in 6  months.      The patient knows to call the clinic with any problems, questions or concerns. We can certainly see the patient much sooner if necessary.    Darrall Dears, MD 06/16/2014 10:32 AM

## 2014-06-16 NOTE — Progress Notes (Signed)
Labs drawn for t4,tsh,ft3

## 2014-06-23 ENCOUNTER — Ambulatory Visit (HOSPITAL_COMMUNITY)
Admission: RE | Admit: 2014-06-23 | Discharge: 2014-06-23 | Disposition: A | Discharge status: Home / Self Care | Payer: Medicare Other | Source: Ambulatory Visit | Attending: Oncology | Admitting: Oncology

## 2014-06-23 DIAGNOSIS — Z8589 Personal history of malignant neoplasm of other organs and systems: Secondary | ICD-10-CM | POA: Diagnosis not present

## 2014-06-23 DIAGNOSIS — I712 Thoracic aortic aneurysm, without rupture, unspecified: Secondary | ICD-10-CM | POA: Diagnosis not present

## 2014-06-23 DIAGNOSIS — C07 Malignant neoplasm of parotid gland: Secondary | ICD-10-CM

## 2014-06-23 DIAGNOSIS — Z923 Personal history of irradiation: Secondary | ICD-10-CM | POA: Diagnosis not present

## 2014-06-23 MED ORDER — IOHEXOL 300 MG/ML  SOLN
100.0000 mL | Freq: Once | INTRAMUSCULAR | Status: AC | PRN
  Administered 2014-06-23: 100 mL via INTRAVENOUS

## 2014-07-06 ENCOUNTER — Encounter: Payer: Medicare Other | Admitting: *Deleted

## 2014-07-06 ENCOUNTER — Telehealth: Payer: Self-pay | Admitting: Cardiology

## 2014-07-06 NOTE — Telephone Encounter (Signed)
Confirmed remote transmission with pt son.

## 2014-07-07 ENCOUNTER — Encounter: Payer: Self-pay | Admitting: Cardiology

## 2014-08-18 ENCOUNTER — Ambulatory Visit (INDEPENDENT_AMBULATORY_CARE_PROVIDER_SITE_OTHER): Payer: Medicare Other | Admitting: Otolaryngology

## 2014-08-18 DIAGNOSIS — C07 Malignant neoplasm of parotid gland: Secondary | ICD-10-CM

## 2014-08-30 ENCOUNTER — Encounter: Payer: Self-pay | Admitting: *Deleted

## 2014-09-26 ENCOUNTER — Encounter: Payer: Self-pay | Admitting: Family

## 2014-09-27 ENCOUNTER — Ambulatory Visit (INDEPENDENT_AMBULATORY_CARE_PROVIDER_SITE_OTHER): Payer: Medicare Other | Admitting: Family

## 2014-09-27 ENCOUNTER — Ambulatory Visit (HOSPITAL_COMMUNITY)
Admission: RE | Admit: 2014-09-27 | Discharge: 2014-09-27 | Disposition: A | Discharge status: Home / Self Care | Payer: Medicare Other | Source: Ambulatory Visit | Attending: Family | Admitting: Family

## 2014-09-27 ENCOUNTER — Encounter: Payer: Self-pay | Admitting: Family

## 2014-09-27 VITALS — BP 182/80 | HR 60 | Resp 16 | Ht 64.0 in | Wt 152.0 lb

## 2014-09-27 DIAGNOSIS — I6529 Occlusion and stenosis of unspecified carotid artery: Secondary | ICD-10-CM | POA: Diagnosis not present

## 2014-09-27 DIAGNOSIS — Z48812 Encounter for surgical aftercare following surgery on the circulatory system: Secondary | ICD-10-CM

## 2014-09-27 DIAGNOSIS — I6522 Occlusion and stenosis of left carotid artery: Secondary | ICD-10-CM

## 2014-09-27 NOTE — Progress Notes (Signed)
Established Carotid Patient   History of Present Illness  Fernando [name] is a 80 y.o. male patient of Dr. Kellie Simmering who is s/p left carotid endarterectomy with resection of a redundant segment in 2009.  He has a moderate right ICA stenosis which we have been following. He did have problems with cancer of the parotid gland in 2014 and has concluded treatment and is doing well, he has some loss of taste and decreased saliva production from his radiation treatment, per his son. He is physically active, chops wood. The patient denies claudication symptoms with walking, denies non healing wounds. He is left hand dominant, states he has mild weakness in his left arm and hand only when working with his arm/hand, denies dizziness when raising arms above his head.  The patient denies any history of TIA or stroke symptoms, specifically the patient denies a history of amaurosis fugax or monocular blindness, denies a history unilateral  of facial drooping, denies a history of hemiplegia, and denies a history of receptive or expressive aphasia.    The patient denies New Medical or Surgical History.  Pt Diabetic: No Pt smoker: former smoker, quit in 1974  Pt meds include: Statin : Yes ASA: Yes Other anticoagulants/antiplatelets: Xaralto, noted history of atrial fib   Past Medical History  Diagnosis Date  . Carotid artery disease     Dr. Kellie Simmering - s/p left CEA  . Hyperlipidemia   . Essential hypertension, benign   . Arthritis   . Atrial fibrillation   . Tachycardia-bradycardia syndrome   . 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 This mass was initially felt to be 1.7 cm at the time of presentation but had enlarged significantly to 4 cm clinically at the time of surgery. Pathologically it was 3.4 cm. A lymph node deep to the parotid was enlarged clinically at the time  of surgery but was negative     Social History History  Substance Use Topics  . Smoking status: Former Smoker    Types: Cigarettes    Quit date: 11/25/1972  . Smokeless tobacco: Not on file  . Alcohol Use: 0.6 oz/week    1 Cans of beer per week     Comment: daily    Family History Family History  Problem Relation Age of Onset  . Heart disease Sister   . Heart attack Sister   . Cirrhosis Mother   . Cancer Brother     brain cancer  . Cancer Sister     stomach cancer    Surgical History Past Surgical History  Procedure Laterality Date  . Carotid endarterectomy    . Cataract extraction w/ intraocular lens implant      Bilateral  . Pacemaker insertion  10-01-2012  . Insert / replace / remove pacemaker    . Parathyroidectomy  10/28/2012  . Parotidectomy  10/28/2012    Procedure: PAROTIDECTOMY;  Surgeon: Ascencion Dike, MD;  Location: [facility];  Service: ENT;  Laterality: Right;  Total Parotidectomy    No Known Allergies  Current Outpatient Prescriptions  Medication Sig Dispense Refill  . ARIPiprazole (ABILIFY) 20 MG tablet Take 20 mg by mouth daily.    Marland Kitchen atorvastatin (LIPITOR) 40 MG tablet Take 40 mg by mouth every morning.     . Glucosamine HCl 1500 MG TABS Take 1,500 mg by mouth every morning.    Marland Kitchen losartan (COZAAR) 50 MG tablet  Take 50 mg by mouth every morning.     . metoprolol tartrate (LOPRESSOR) 25 MG tablet TAKE 1 TABLET TWICE A DAY 180 tablet 0  . naproxen sodium (ALEVE) 220 MG tablet Take 440 mg by mouth every 12 (twelve) hours as needed. For headaches    . XARELTO 20 MG TABS tablet TAKE 1 TABLET BY MOUTH EVERY DAY 90 tablet 3   No current facility-administered medications for this visit.    Review of Systems : See HPI for pertinent positives and negatives.  Physical Examination  Filed Vitals:   09/27/14 0928 09/27/14 0931  BP: 167/83 182/80  Pulse: 60 60  Resp:  16  Height:  5\' 4"  (1.626 m)  Weight:  152 lb (68.947 kg)  SpO2:  100%   Body mass index is  26.08 kg/(m^2).  General: WDWN male in NAD GAIT: normal Eyes: PERRLA Pulmonary:  Non-labored, CTAB, Negative  Rales, Negative rhonchi, & Negative wheezing.  Cardiac: regular Rhythm,  Negative detected murmur.  VASCULAR EXAM Carotid Bruits Right Left   Negative Positive    Aorta is not palpable. Radial pulses:  2+ palpable right and 1+ palpable left.                                                                                                                            LE Pulses Right Left       POPLITEAL  not palpable   not palpable       POSTERIOR TIBIAL  not palpable   not palpable        DORSALIS PEDIS      ANTERIOR TIBIAL 2+ palpable  2+ palpable     Gastrointestinal: soft, nontender, BS WNL, no r/g,  negative palpated masses.  Musculoskeletal: Negative muscle atrophy/wasting. M/S 5/5 throughout, Extremities without ischemic changes.  Neurologic: A&O X 3; Appropriate Affect ; SENSATION ;normal;  Speech is normal CN 2-12 intact except is hard of hearing, Pain and light touch intact in extremities, Motor exam as listed above.   Non-Invasive Vascular Imaging CAROTID DUPLEX 09/27/2014   CEREBROVASCULAR DUPLEX EVALUATION    INDICATION: Carotid artery disease    PREVIOUS INTERVENTION(S): Left carotid endarterectomy and resection of redundant left common carotid artery 11/28/2007. Pacemaker 2013    DUPLEX EXAM: Carotid duplex    RIGHT  LEFT  Peak Systolic Velocities (cm/s) End Diastolic Velocities (cm/s) Plaque LOCATION Peak Systolic Velocities (cm/s) End Diastolic Velocities (cm/s) Plaque  74 11 - CCA PROXIMAL 61 7 -  79 17 HT CCA MID 57 8 -  79 15 HT CCA DISTAL 52 10 -  65 6 - ECA 52 2 -  225 62 CP ICA PROXIMAL 61 16 -  223 68 CP ICA MID 60 18 -  128 27 - ICA DISTAL 78 18 -    2.8 ICA / CCA Ratio (PSV) N/A  Antegrade Vertebral Flow To and fro  706 Brachial Systolic Pressure (mmHg) 237  Triphasic Brachial Artery Waveforms  M brisk    Plaque Morphology:  HM =  Homogeneous, HT = Heterogeneous, CP = Calcific Plaque, SP = Smooth Plaque, IP = Irregular Plaque     ADDITIONAL FINDINGS: Medial left subclavian artery 284 cms with plaque visualized. Mid left subclavian monophasic waveform    IMPRESSION: 1. 60 - 79% right internal carotid artery stenosis, calcific plaque may obscure higher velocity. 2. Patent left carotid endarterectomy with less than 40% internal carotid artery stenosis. 3. Dampened left brachial artery and bidirectional left vertebral suggests more proximal disease.    Compared to the previous exam:  No significant change      Assessment: Fernando [name] is a 80 y.o. male who is s/p left carotid endarterectomy with resection of a redundant segment in 2009. He presents with asymptomatic 60 - 79% right internal carotid artery stenosis, calcific plaque may obscure higher velocity. Patent left carotid endarterectomy with less than 40% internal carotid artery stenosis. Dampened left brachial artery and bidirectional left vertebral suggests more proximal disease.  The patient and his son are advised to see the patient's PCP re his hypertension, he is not sure what his blood pressure is when he is not in a medical office, but son states his father gets very anxious in a medical facility.  Plan: Follow-up in 6 months with Carotid Duplex scan, but the patient and son state they will be back in a year as they have been coming yearly and he is on a fixed income.   I discussed in depth with the patient the nature of atherosclerosis, and emphasized the importance of maximal medical management including strict control of blood pressure, blood glucose, and lipid levels, obtaining regular exercise, and continued cessation of smoking.  The patient is aware that without maximal medical management the underlying atherosclerotic disease process will progress, limiting the benefit of any interventions. The patient was given information about stroke prevention  and what symptoms should prompt the patient to seek immediate medical care. Thank you for allowing [place] to participate in this patient's care.  Clemon Chambers, RN, MSN, FNP-C [facility] Office: [phone]  Clinic Physician: Kellie Simmering  09/27/2014 9:27 AM

## 2014-09-27 NOTE — Patient Instructions (Signed)
Stroke Prevention Some medical conditions and behaviors are associated with an increased chance of having a stroke. You may prevent a stroke by making healthy choices and managing medical conditions. HOW CAN I REDUCE MY RISK OF HAVING A STROKE?   Stay physically active. Get at least 30 minutes of activity on most or all days.  Do not smoke. It may also be helpful to avoid exposure to secondhand smoke.  Limit alcohol use. Moderate alcohol use is considered to be:  No more than 2 drinks per day for men.  No more than 1 drink per day for nonpregnant women.  Eat healthy foods. This involves:  Eating 5 or more servings of fruits and vegetables a day.  Making dietary changes that address high blood pressure (hypertension), high cholesterol, diabetes, or obesity.  Manage your cholesterol levels.  Making food choices that are high in fiber and low in saturated fat, trans fat, and cholesterol may control cholesterol levels.  Take any prescribed medicines to control cholesterol as directed by your health care provider.  Manage your diabetes.  Controlling your carbohydrate and sugar intake is recommended to manage diabetes.  Take any prescribed medicines to control diabetes as directed by your health care provider.  Control your hypertension.  Making food choices that are low in salt (sodium), saturated fat, trans fat, and cholesterol is recommended to manage hypertension.  Take any prescribed medicines to control hypertension as directed by your health care provider.  Maintain a healthy weight.  Reducing calorie intake and making food choices that are low in sodium, saturated fat, trans fat, and cholesterol are recommended to manage weight.  Stop drug abuse.  Avoid taking birth control pills.  Talk to your health care provider about the risks of taking birth control pills if you are over 35 years old, smoke, get migraines, or have ever had a blood clot.  Get evaluated for sleep  disorders (sleep apnea).  Talk to your health care provider about getting a sleep evaluation if you snore a lot or have excessive sleepiness.  Take medicines only as directed by your health care provider.  For some people, aspirin or blood thinners (anticoagulants) are helpful in reducing the risk of forming abnormal blood clots that can lead to stroke. If you have the irregular heart rhythm of atrial fibrillation, you should be on a blood thinner unless there is a good reason you cannot take them.  Understand all your medicine instructions.  Make sure that other conditions (such as anemia or atherosclerosis) are addressed. SEEK IMMEDIATE MEDICAL CARE IF:   You have sudden weakness or numbness of the face, arm, or leg, especially on one side of the body.  Your face or eyelid droops to one side.  You have sudden confusion.  You have trouble speaking (aphasia) or understanding.  You have sudden trouble seeing in one or both eyes.  You have sudden trouble walking.  You have dizziness.  You have a loss of balance or coordination.  You have a sudden, severe headache with no known cause.  You have new chest pain or an irregular heartbeat. Any of these symptoms may represent a serious problem that is an emergency. Do not wait to see if the symptoms will go away. Get medical help at once. Call your local emergency services (911 in [place] Do not drive yourself to the hospital. Document Released: 12/19/2004 Document Revised: 03/28/2014 Document Reviewed: 05/14/2013 [employer] Patient Information 2015 [employer]. This information is not intended to replace advice given   to you by your health care provider. Make sure you discuss any questions you have with your health care provider.  

## 2014-10-19 ENCOUNTER — Encounter: Payer: Self-pay | Admitting: *Deleted

## 2014-11-03 ENCOUNTER — Encounter (HOSPITAL_COMMUNITY): Payer: Self-pay | Admitting: Internal Medicine

## 2014-11-10 ENCOUNTER — Ambulatory Visit (HOSPITAL_COMMUNITY): Payer: Medicare Other

## 2014-11-11 NOTE — Progress Notes (Signed)
This encounter was created in error - please disregard.

## 2014-11-28 NOTE — Progress Notes (Signed)
[place], [place] [id number]  Carcinoma of the parotid gland, status post resection with positive margins treated with postop radiotherapy with additional surgery on 10/28/2012.   CURRENT THERAPY:Observation  INTERVAL HISTORY: Fernando [name] 81 y.o. male returns for follow-up of his parotid gland carcinoma.  He complains about altered taste and dry.mouth. He wants to cut down on the number of physicians he is seeing regarding his cancer.  Coming to physicians is very stressful for him.  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  MEDICAL HISTORY: Past Medical History  Diagnosis Date  . Carotid artery disease     Dr. Kellie Simmering - s/p left CEA  . Hyperlipidemia   . Essential hypertension, benign   . Arthritis   . Atrial fibrillation   . Tachycardia-bradycardia syndrome   . 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 This mass was initially felt to be 1.7 cm at the time of presentation but had enlarged significantly to 4 cm clinically at the time of surgery. Pathologically it was 3.4 cm. A lymph node deep to the parotid was enlarged clinically at the time of surgery but was negative   . Coronary artery disease     has Carotid artery stenosis, asymptomatic; Atrial fibrillation; Essential hypertension, benign; Tachy-brady syndrome; Pacemaker-St.Jude; Squamous cell carcinoma of parotid; Carotid stenosis; and Aftercare following surgery of the circulatory system on his problem list.      Squamous cell carcinoma of parotid   10/28/2012 Initial Diagnosis Invasive squamous cell carcinoma of right parotid   11/17/2012 Cancer Staging PET scan, negative for metastatic disease   11/30/2012 - 01/11/2013 Radiation Therapy Dr. Orlene Erm   01/12/2013 Remission      has No Known Allergies.  Mr. Jeudy does not currently have medications on  file.  SURGICAL HISTORY: Past Surgical History  Procedure Laterality Date  . Cataract extraction w/ intraocular lens implant      Bilateral  . Pacemaker insertion  10-01-2012  . Insert / replace / remove pacemaker    . Parathyroidectomy  10/28/2012  . Parotidectomy  10/28/2012    Procedure: PAROTIDECTOMY;  Surgeon: Ascencion Dike, MD;  Location: [facility];  Service: ENT;  Laterality: Right;  Total Parotidectomy  . Eye surgery    . Carotid endarterectomy Left Jan. 3, 2009    CE  . Permanent pacemaker insertion N/A 10/01/2012    Procedure: PERMANENT PACEMAKER INSERTION;  Surgeon: Evans Lance, MD;  Location: [facility] CATH LAB;  Service: Cardiovascular;  Laterality: N/A;    SOCIAL HISTORY: History   Social History  . Marital Status: Widowed    Spouse Name: N/A    Number of Children: N/A  . Years of Education: N/A   Occupational History  . Not on file.   Social History Main Topics  . Smoking status: Former Smoker    Types: Cigarettes    Quit date: 11/25/1972  . Smokeless tobacco: Never Used  . Alcohol Use: 0.6 oz/week    1 Cans of beer per week     Comment: daily  . Drug Use: No  . Sexual Activity: No   Other Topics Concern  . Not on file   Social History Narrative    FAMILY HISTORY: Family History  Problem Relation Age of Onset  . Heart disease Sister   . Heart attack Sister   . Cirrhosis Mother   .  Cancer Brother     brain cancer  . Cancer Sister     stomach cancer    Review of Systems  Constitutional: Negative.   HENT: Positive for hearing loss. Negative for congestion, ear discharge, ear pain, nosebleeds, sore throat and tinnitus.        Dry mouth  Eyes: Negative.   Respiratory: Negative.  Negative for stridor.   Cardiovascular: Negative for chest pain, palpitations, orthopnea, claudication, leg swelling and PND.       Has a pacemaker. Sees his cardiologist on thursday  Gastrointestinal: Negative.   Genitourinary: Negative.   Musculoskeletal: Negative.         Knee pain  Skin: Negative.   Neurological: Negative.  Negative for headaches.  Endo/Heme/Allergies: Negative.   Psychiatric/Behavioral: Negative.     PHYSICAL EXAMINATION  ECOG PERFORMANCE STATUS: 0 - Asymptomatic  Filed Vitals:   11/29/14 0954  BP: 179/77  Pulse: 60  Temp: 97.4 F (36.3 C)  Resp: 18    Physical Exam  Constitutional: He is oriented to person, place, and time and well-developed, well-nourished, and in no distress.    HENT:  Head: Normocephalic and atraumatic.  Nose: Nose normal.  Mouth/Throat: Oropharynx is clear and moist. No oropharyngeal exudate.  Eyes: Conjunctivae and EOM are normal. Pupils are equal, round, and reactive to light. Right eye exhibits no discharge. Left eye exhibits no discharge. No scleral icterus.  Neck: Normal range of motion. Neck supple. No tracheal deviation present. No thyromegaly present.  Cardiovascular: Normal rate, regular rhythm and normal heart sounds.  Exam reveals no gallop and no friction rub.   No murmur heard. Pulmonary/Chest: Effort normal and breath sounds normal. He has no wheezes. He has no rales.  Abdominal: Soft. Bowel sounds are normal. He exhibits no distension and no mass. There is no tenderness. There is no rebound and no guarding.  Musculoskeletal: Normal range of motion. He exhibits no edema.  Lymphadenopathy:       Head (right side): Posterior auricular adenopathy present. No submental, no submandibular, no tonsillar and no preauricular adenopathy present.       Head (left side): No submental, no submandibular, no tonsillar, no preauricular and no posterior auricular adenopathy present.    He has no cervical adenopathy.    He has no axillary adenopathy.  Neurological: He is alert and oriented to person, place, and time. He has normal reflexes. No cranial nerve deficit. Gait normal. Coordination normal.  Skin: Skin is warm and dry. No rash noted.  Psychiatric: Mood, memory, affect and judgment normal.    Nursing note and vitals reviewed.   LABORATORY DATA:  CBC    Component Value Date/Time   WBC 5.9 06/14/2014 0859   RBC 4.58 06/14/2014 0859   RBC 4.58 06/14/2014 0859   HGB 14.8 06/14/2014 0859   HCT 43.8 06/14/2014 0859   PLT 167 06/14/2014 0859   MCV 95.6 06/14/2014 0859   MCH 32.3 06/14/2014 0859   MCHC 33.8 06/14/2014 0859   RDW 15.2 06/14/2014 0859   LYMPHSABS 1.6 06/14/2014 0859   MONOABS 0.8 06/14/2014 0859   EOSABS 0.5 06/14/2014 0859   BASOSABS 0.0 06/14/2014 0859   CMP     Component Value Date/Time   NA 144 06/14/2014 0859   K 4.7 06/14/2014 0859   CL 107 06/14/2014 0859   CO2 26 06/14/2014 0859   GLUCOSE 108* 06/14/2014 0859   BUN 24* 06/14/2014 0859   CREATININE 0.96 06/14/2014 0859   CREATININE 0.95 09/28/2012 1308  CALCIUM 8.8 06/14/2014 0859   PROT 7.3 06/14/2014 0859   ALBUMIN 3.7 06/14/2014 0859   AST 24 06/14/2014 0859   ALT 16 06/14/2014 0859   ALKPHOS 64 06/14/2014 0859   BILITOT 0.9 06/14/2014 0859   GFRNONAA 74* 06/14/2014 0859   GFRAA 86* 06/14/2014 0859     PENDING LABS:   RADIOGRAPHIC STUDIES:  No results found. 06/23/2014 EXAM: CT NECK WITH CONTRAST IMPRESSION: Stable changes in the right parotid gland. No recurrent mass or adenopathy  Severe atherosclerotic disease with critical stenosis right internal carotid artery unchanged  Interval resolution of left thyroid cyst.   Electronically Signed  By: Franchot Gallo M.D.  On: 06/23/2014 09:55  EXAM: CT CHEST WITH CONTRAST  IMPRESSION: 1. No evidence of metastatic disease within the chest. 2. 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 This is better defined on the current study due to the inclusion of contrast. This could reflect a posttraumatic finding. Cardiothoracic surgical consultation should be considered as well as continued CT follow up. 3. Neck CT findings are dictated separately.   Electronically Signed  By: Camie Patience M.D.   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 He underwent surgical resection on 10/28/2012. He had positive margins after surgery and then underwent adjuvant radiation. He is doing well with no obvious evidence of recurrence. He had imaging studies in July 2015. He states he does not like going to the doctor and would like to reduce the number of physicians he sees. I have advised him he needs ongoing surveillance of his malignancy and he can either follow with radiation oncology or myself. I recommended rechecking a TSH given his history of radiation to the neck I will advise him of the results of that lab when it becomes available. His any interim problems or concerns advised to call but I will tentatively schedule him for follow-up again in 3-4 months.     All questions were answered. The patient knows to call the clinic with any problems, questions or concerns. We can certainly see the patient much sooner if necessary.  Molli Hazard 12/08/2014

## 2014-11-29 ENCOUNTER — Encounter (HOSPITAL_COMMUNITY): Payer: Medicare Other | Attending: Hematology & Oncology | Admitting: Hematology & Oncology

## 2014-11-29 ENCOUNTER — Encounter (HOSPITAL_BASED_OUTPATIENT_CLINIC_OR_DEPARTMENT_OTHER): Payer: Medicare Other

## 2014-11-29 ENCOUNTER — Encounter (HOSPITAL_COMMUNITY): Payer: Self-pay | Admitting: Hematology & Oncology

## 2014-11-29 VITALS — BP 179/77 | HR 60 | Temp 97.4°F | Resp 18 | Wt 151.6 lb

## 2014-11-29 DIAGNOSIS — C07 Malignant neoplasm of parotid gland: Secondary | ICD-10-CM | POA: Diagnosis present

## 2014-11-29 LAB — TSH: TSH: 4.818 u[IU]/mL — ABNORMAL HIGH (ref 0.350–4.500)

## 2014-11-29 LAB — T4, FREE: FREE T4: 1.74 ng/dL (ref 0.80–1.80)

## 2014-11-29 NOTE — Progress Notes (Signed)
LABS FOR FT4,TSH

## 2014-11-29 NOTE — Patient Instructions (Signed)
[facility] Discharge Instructions  RECOMMENDATIONS MADE BY THE CONSULTANT AND ANY TEST RESULTS WILL BE SENT TO YOUR REFERRING PHYSICIAN.  If you have any problems prior to follow-up please call. We will check your thyroid function today. We will call you with the results  Thank you for choosing [facility] to provide your oncology and hematology care.  To afford each patient quality time with our providers, please arrive at least 15 minutes before your scheduled appointment time.  With your help, our goal is to use those 15 minutes to complete the necessary work-up to ensure our physicians have the information they need to help with your evaluation and healthcare recommendations.    Effective January 1st, 2014, we ask that you re-schedule your appointment with our physicians should you arrive 10 or more minutes late for your appointment.  We strive to give you quality time with our providers, and arriving late affects you and other patients whose appointments are after yours.    Again, thank you for choosing [facility]  Our hope is that these requests will decrease the amount of time that you wait before being seen by our physicians.       _____________________________________________________________  Should you have questions after your visit to [facility], please contact our office at [phone] between the hours of 8:30 a.m. and 5:00 p.m.  Voicemails left after 4:30 p.m. will not be returned until the following business day.  For prescription refill requests, have your pharmacy contact our office with your prescription refill request.

## 2014-11-30 ENCOUNTER — Other Ambulatory Visit (HOSPITAL_COMMUNITY): Payer: Self-pay | Admitting: Hematology & Oncology

## 2014-11-30 DIAGNOSIS — E038 Other specified hypothyroidism: Secondary | ICD-10-CM

## 2014-11-30 MED ORDER — LEVOTHYROXINE SODIUM 25 MCG PO TABS: 25.0000 ug | ORAL_TABLET | Freq: Every day | ORAL | Status: DC

## 2014-12-01 ENCOUNTER — Encounter: Payer: Self-pay | Admitting: Internal Medicine

## 2014-12-01 ENCOUNTER — Ambulatory Visit (INDEPENDENT_AMBULATORY_CARE_PROVIDER_SITE_OTHER): Payer: Medicare Other | Admitting: Internal Medicine

## 2014-12-01 VITALS — BP 158/88 | HR 60 | Ht 64.0 in | Wt 150.6 lb

## 2014-12-01 DIAGNOSIS — I10 Essential (primary) hypertension: Secondary | ICD-10-CM

## 2014-12-01 DIAGNOSIS — I495 Sick sinus syndrome: Secondary | ICD-10-CM

## 2014-12-01 DIAGNOSIS — I480 Paroxysmal atrial fibrillation: Secondary | ICD-10-CM

## 2014-12-01 DIAGNOSIS — Z950 Presence of cardiac pacemaker: Secondary | ICD-10-CM

## 2014-12-01 LAB — MDC_IDC_ENUM_SESS_TYPE_INCLINIC
Battery Remaining Longevity: 94.8 mo
Brady Statistic RA Percent Paced: 0 %
Brady Statistic RV Percent Paced: 83 %
Implantable Pulse Generator Model: 2210
Implantable Pulse Generator Serial Number: 7411696
Lead Channel Pacing Threshold Pulse Width: 0.4 ms
Lead Channel Sensing Intrinsic Amplitude: 0.2 mV
Lead Channel Setting Pacing Amplitude: 2.5 V
Lead Channel Setting Pacing Pulse Width: 0.4 ms
Lead Channel Setting Sensing Sensitivity: 2 mV
MDC IDC MSMT BATTERY VOLTAGE: 2.92 V
MDC IDC MSMT LEADCHNL RA IMPEDANCE VALUE: 437.5 Ohm
MDC IDC MSMT LEADCHNL RV IMPEDANCE VALUE: 462.5 Ohm
MDC IDC MSMT LEADCHNL RV PACING THRESHOLD AMPLITUDE: 0.75 V
MDC IDC MSMT LEADCHNL RV SENSING INTR AMPL: 12 mV
MDC IDC SESS DTM: 20160107092245
MDC IDC SET LEADCHNL RA PACING AMPLITUDE: 3.5 V

## 2014-12-01 NOTE — Assessment & Plan Note (Signed)
His St. Jude DDD PM is working normally. Will recheck in several months. 

## 2014-12-01 NOTE — Assessment & Plan Note (Signed)
His blood pressure is elevated. His son notes that at home it is not and that he is quite anxious about coming to the doctors office.

## 2014-12-01 NOTE — Assessment & Plan Note (Signed)
His ventricular rate is well controlled. No change in meds. He is tolerating his Xarelto.

## 2014-12-01 NOTE — Progress Notes (Signed)
HPI Fernando [name] returns today for followup. He is a very pleasant 81 year old man with symptomatic tachybradycardia syndrome, persistent atrial fibrillation, and hypertension. He is status post insertion of a St. Jude dual-chamber pacemaker.  He denies palpitations, chest pain, or shortness of breath. No syncope. His energy level is down slightly.     No Known Allergies   Current Outpatient Prescriptions  Medication Sig Dispense Refill  . ARIPiprazole (ABILIFY) 20 MG tablet Take 20 mg by mouth daily.    Marland Kitchen atorvastatin (LIPITOR) 40 MG tablet Take 40 mg by mouth every morning.     . Glucosamine HCl 1500 MG TABS Take 1,500 mg by mouth every morning.    Marland Kitchen losartan (COZAAR) 50 MG tablet Take 50 mg by mouth every morning.     . metoprolol tartrate (LOPRESSOR) 25 MG tablet TAKE 1 TABLET TWICE A DAY 180 tablet 0  . naproxen sodium (ALEVE) 220 MG tablet Take 440 mg by mouth every 12 (twelve) hours as needed. For headaches    . XARELTO 20 MG TABS tablet TAKE 1 TABLET BY MOUTH EVERY DAY 90 tablet 3  . levothyroxine (SYNTHROID) 25 MCG tablet Take 1 tablet (25 mcg total) by mouth daily before breakfast. (Patient not taking: Reported on 12/01/2014) 0.3 tablet 3   No current facility-administered medications for this visit.     Past Medical History  Diagnosis Date  . Carotid artery disease     Dr. Kellie Simmering - s/p left CEA  . Hyperlipidemia   . Essential hypertension, benign   . Arthritis   . Atrial fibrillation   . Tachycardia-bradycardia syndrome   . 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 This mass was initially felt to be 1.7 cm at the time of presentation but had enlarged significantly to 4 cm clinically at the time of surgery. Pathologically it was 3.4 cm. A lymph node deep to the parotid was enlarged clinically at the time of surgery but was negative   . Coronary  artery disease     ROS:   All systems reviewed and negative except as noted in the HPI.   Past Surgical History  Procedure Laterality Date  . Cataract extraction w/ intraocular lens implant      Bilateral  . Pacemaker insertion  10-01-2012  . Insert / replace / remove pacemaker    . Parathyroidectomy  10/28/2012  . Parotidectomy  10/28/2012    Procedure: PAROTIDECTOMY;  Surgeon: Ascencion Dike, MD;  Location: [facility];  Service: ENT;  Laterality: Right;  Total Parotidectomy  . Eye surgery    . Carotid endarterectomy Left Jan. 3, 2009    CE  . Permanent pacemaker insertion N/A 10/01/2012    Procedure: PERMANENT PACEMAKER INSERTION;  Surgeon: Evans Lance, MD;  Location: [facility] CATH LAB;  Service: Cardiovascular;  Laterality: N/A;     Family History  Problem Relation Age of Onset  . Heart disease Sister   . Heart attack Sister   . Cirrhosis Mother   . Cancer Brother     brain cancer  . Cancer Sister     stomach cancer     History   Social History  . Marital Status: Widowed    Spouse Name: N/A    Number of Children: N/A  . Years of Education: N/A   Occupational History  . Not on file.   Social History Main Topics  .  Smoking status: Former Smoker    Types: Cigarettes    Quit date: 11/25/1972  . Smokeless tobacco: Never Used  . Alcohol Use: 0.6 oz/week    1 Cans of beer per week     Comment: daily  . Drug Use: No  . Sexual Activity: No   Other Topics Concern  . Not on file   Social History Narrative     BP 158/88 mmHg  Pulse 60  Ht 5\' 4"  (1.626 m)  Wt 150 lb 9.6 oz (68.312 kg)  BMI 25.84 kg/m2  Physical Exam:  Well appearing, elderly man, NAD HEENT: Unremarkable Neck:  7 cm JVD, no thyromegally Lungs:  Clearwith no wheezes, rales, or rhonchi. HEART:  Regular rate rhythm, no murmurs, no rubs, no clicks Abd:  soft, positive bowel sounds, no organomegally, no rebound, no guarding Ext:  2 plus pulses, no edema, no cyanosis, no clubbing Skin:  No rashes no  nodules Neuro:  CN II through XII intact, motor grossly intact  DEVICE  Normal device function.  See PaceArt for details.   Assess/Plan:

## 2014-12-01 NOTE — Patient Instructions (Signed)
Your physician wants you to follow-up in: 12 months with Dr Lovena Le in [place] will receive a reminder letter in the mail two months in advance. If you don't receive a letter, please call our office to schedule the follow-up appointment.   Remote monitoring is used to monitor your Pacemaker of ICD from home. This monitoring reduces the number of office visits required to check your device to one time per year. It allows [place] to keep an eye on the functioning of your device to ensure it is working properly. You are scheduled for a device check from home on 03/02/15. You may send your transmission at any time that day. If you have a wireless device, the transmission will be sent automatically. After your physician reviews your transmission, you will receive a postcard with your next transmission date.

## 2014-12-08 ENCOUNTER — Encounter (HOSPITAL_COMMUNITY): Payer: Self-pay | Admitting: Hematology & Oncology

## 2014-12-08 NOTE — Assessment & Plan Note (Signed)
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 He underwent surgical resection on 10/28/2012. He had positive margins after surgery and then underwent adjuvant radiation. He is doing well with no obvious evidence of recurrence. He had imaging studies in July 2015. He states he does not like going to the doctor and would like to reduce the number of physicians he sees. I have advised him he needs ongoing surveillance of his malignancy and he can either follow with radiation oncology or myself. I recommended rechecking a TSH given his history of radiation to the neck I will advise him of the results of that lab when it becomes available. His any interim problems or concerns advised to call but I will tentatively schedule him for follow-up again in 3-4 months.

## 2014-12-23 ENCOUNTER — Other Ambulatory Visit: Payer: Self-pay | Admitting: *Deleted

## 2014-12-23 MED ORDER — RIVAROXABAN 20 MG PO TABS: 20.0000 mg | ORAL_TABLET | Freq: Every day | ORAL | Status: DC

## 2015-01-18 ENCOUNTER — Other Ambulatory Visit (HOSPITAL_COMMUNITY): Payer: Medicare Other

## 2015-01-25 ENCOUNTER — Encounter (HOSPITAL_COMMUNITY): Payer: Medicare Other | Attending: Hematology & Oncology

## 2015-01-25 DIAGNOSIS — E038 Other specified hypothyroidism: Secondary | ICD-10-CM

## 2015-01-25 DIAGNOSIS — C07 Malignant neoplasm of parotid gland: Secondary | ICD-10-CM | POA: Diagnosis not present

## 2015-01-25 DIAGNOSIS — Z8589 Personal history of malignant neoplasm of other organs and systems: Secondary | ICD-10-CM

## 2015-01-25 LAB — TSH: TSH: 5.22 u[IU]/mL — ABNORMAL HIGH (ref 0.350–4.500)

## 2015-01-25 NOTE — Progress Notes (Signed)
LABS FOR TSH

## 2015-02-10 ENCOUNTER — Telehealth (HOSPITAL_COMMUNITY): Payer: Self-pay

## 2015-02-10 DIAGNOSIS — C07 Malignant neoplasm of parotid gland: Secondary | ICD-10-CM

## 2015-02-10 NOTE — Telephone Encounter (Signed)
Spoke with daughter, Rocky Link and she will discuss with her brother and her father.  To call back with any questions.  Appointment scheduled for 5/13 for recheck of TSH.

## 2015-02-10 NOTE — Telephone Encounter (Signed)
-----   Message from Patrici Ranks, MD sent at 02/07/2015 11:19 AM EDT ----- Increase synthroid to 50 mcg daily. Recheck TSH in 8 weeks. Dr.P

## 2015-02-10 NOTE — Telephone Encounter (Signed)
A user error has taken place: encounter opened in error, closed for administrative reasons.

## 2015-03-02 ENCOUNTER — Ambulatory Visit (INDEPENDENT_AMBULATORY_CARE_PROVIDER_SITE_OTHER): Payer: Medicare Other | Admitting: *Deleted

## 2015-03-02 ENCOUNTER — Encounter: Payer: Self-pay | Admitting: Internal Medicine

## 2015-03-02 DIAGNOSIS — I495 Sick sinus syndrome: Secondary | ICD-10-CM

## 2015-03-02 LAB — MDC_IDC_ENUM_SESS_TYPE_REMOTE
Battery Remaining Longevity: 62 mo
Battery Remaining Percentage: 59 %
Battery Voltage: 2.92 V
Brady Statistic AP VS Percent: 1 %
Brady Statistic RA Percent Paced: 1 %
Brady Statistic RV Percent Paced: 90 %
Lead Channel Impedance Value: 440 Ohm
Lead Channel Setting Pacing Amplitude: 2.5 V
Lead Channel Setting Pacing Amplitude: 3.5 V
Lead Channel Setting Pacing Pulse Width: 0.4 ms
MDC IDC MSMT LEADCHNL RA SENSING INTR AMPL: 0.2 mV
MDC IDC MSMT LEADCHNL RV IMPEDANCE VALUE: 440 Ohm
MDC IDC MSMT LEADCHNL RV PACING THRESHOLD AMPLITUDE: 0.75 V
MDC IDC MSMT LEADCHNL RV PACING THRESHOLD PULSEWIDTH: 0.4 ms
MDC IDC MSMT LEADCHNL RV SENSING INTR AMPL: 12 mV
MDC IDC PG SERIAL: 7411696
MDC IDC SESS DTM: 20160407064235
MDC IDC SET LEADCHNL RV SENSING SENSITIVITY: 2 mV
MDC IDC STAT BRADY AP VP PERCENT: 1 %
MDC IDC STAT BRADY AS VP PERCENT: 90 %
MDC IDC STAT BRADY AS VS PERCENT: 9.8 %

## 2015-03-02 NOTE — Progress Notes (Signed)
Remote pacemaker transmission.   

## 2015-03-20 ENCOUNTER — Encounter: Payer: Self-pay | Admitting: Cardiology

## 2015-03-24 ENCOUNTER — Other Ambulatory Visit (HOSPITAL_COMMUNITY): Payer: Self-pay | Admitting: Oncology

## 2015-03-24 ENCOUNTER — Other Ambulatory Visit (HOSPITAL_COMMUNITY): Payer: Self-pay | Admitting: Hematology & Oncology

## 2015-03-24 DIAGNOSIS — E038 Other specified hypothyroidism: Secondary | ICD-10-CM

## 2015-03-24 MED ORDER — LEVOTHYROXINE SODIUM 25 MCG PO TABS: 25.0000 ug | ORAL_TABLET | Freq: Every day | ORAL | Status: DC

## 2015-03-28 ENCOUNTER — Other Ambulatory Visit (HOSPITAL_COMMUNITY): Payer: Self-pay | Admitting: Oncology

## 2015-03-28 DIAGNOSIS — E038 Other specified hypothyroidism: Secondary | ICD-10-CM

## 2015-03-28 MED ORDER — LEVOTHYROXINE SODIUM 25 MCG PO TABS: 25.0000 ug | ORAL_TABLET | Freq: Every day | ORAL | Status: DC

## 2015-04-07 ENCOUNTER — Other Ambulatory Visit (HOSPITAL_COMMUNITY): Payer: Medicare Other

## 2015-04-11 ENCOUNTER — Encounter (HOSPITAL_COMMUNITY): Payer: Medicare Other | Attending: Hematology & Oncology

## 2015-04-11 DIAGNOSIS — C07 Malignant neoplasm of parotid gland: Secondary | ICD-10-CM | POA: Insufficient documentation

## 2015-04-11 DIAGNOSIS — Z8589 Personal history of malignant neoplasm of other organs and systems: Secondary | ICD-10-CM | POA: Diagnosis not present

## 2015-04-11 LAB — TSH: TSH: 4.819 u[IU]/mL — AB (ref 0.350–4.500)

## 2015-04-11 NOTE — Progress Notes (Signed)
Labs drawn

## 2015-04-17 ENCOUNTER — Telehealth (HOSPITAL_COMMUNITY): Payer: Self-pay | Admitting: *Deleted

## 2015-04-17 DIAGNOSIS — E038 Other specified hypothyroidism: Secondary | ICD-10-CM

## 2015-04-17 MED ORDER — LEVOTHYROXINE SODIUM 25 MCG PO TABS: 37.5000 ug | ORAL_TABLET | Freq: Every day | ORAL | Status: DC

## 2015-04-17 NOTE — Telephone Encounter (Signed)
Patient's son notified to increase levothyroxin to 1 1/2 tabs daily for a total dose of 37.5 mcg. Verbalized understanding.

## 2015-04-28 ENCOUNTER — Encounter: Payer: Self-pay | Admitting: Internal Medicine

## 2015-06-05 ENCOUNTER — Ambulatory Visit (INDEPENDENT_AMBULATORY_CARE_PROVIDER_SITE_OTHER): Payer: Medicare Other | Admitting: *Deleted

## 2015-06-05 ENCOUNTER — Encounter: Payer: Self-pay | Admitting: Internal Medicine

## 2015-06-05 DIAGNOSIS — I495 Sick sinus syndrome: Secondary | ICD-10-CM | POA: Diagnosis not present

## 2015-06-05 NOTE — Progress Notes (Signed)
Remote pacemaker transmission.   

## 2015-06-06 ENCOUNTER — Encounter (HOSPITAL_COMMUNITY): Payer: Self-pay | Admitting: Hematology & Oncology

## 2015-06-06 ENCOUNTER — Encounter (HOSPITAL_BASED_OUTPATIENT_CLINIC_OR_DEPARTMENT_OTHER): Payer: Medicare Other

## 2015-06-06 ENCOUNTER — Encounter (HOSPITAL_COMMUNITY): Payer: Medicare Other | Attending: Hematology & Oncology | Admitting: Hematology & Oncology

## 2015-06-06 VITALS — BP 176/79 | HR 60 | Temp 97.6°F | Resp 16 | Wt 151.4 lb

## 2015-06-06 DIAGNOSIS — C07 Malignant neoplasm of parotid gland: Secondary | ICD-10-CM | POA: Insufficient documentation

## 2015-06-06 DIAGNOSIS — Z8589 Personal history of malignant neoplasm of other organs and systems: Secondary | ICD-10-CM

## 2015-06-06 DIAGNOSIS — I6529 Occlusion and stenosis of unspecified carotid artery: Secondary | ICD-10-CM | POA: Diagnosis not present

## 2015-06-06 DIAGNOSIS — E038 Other specified hypothyroidism: Secondary | ICD-10-CM

## 2015-06-06 LAB — TSH: TSH: 4.279 u[IU]/mL (ref 0.350–4.500)

## 2015-06-06 LAB — T4, FREE: Free T4: 1.43 ng/dL — ABNORMAL HIGH (ref 0.61–1.12)

## 2015-06-06 NOTE — Patient Instructions (Signed)
[place] at [facility] Discharge Instructions  RECOMMENDATIONS MADE BY THE CONSULTANT AND ANY TEST RESULTS WILL BE SENT TO YOUR REFERRING PHYSICIAN.  Exam and discussion by Dr. Whitney Muse. Will do some scans at the end of this month. Call with any concerns Follow-up in 6 months with labs and office visit.  Thank you for choosing [place] at [facility] to provide your oncology and hematology care.  To afford each patient quality time with our provider, please arrive at least 15 minutes before your scheduled appointment time.    You need to re-schedule your appointment should you arrive 10 or more minutes late.  We strive to give you quality time with our providers, and arriving late affects you and other patients whose appointments are after yours.  Also, if you no show three or more times for appointments you may be dismissed from the clinic at the providers discretion.     Again, thank you for choosing [facility].  Our hope is that these requests will decrease the amount of time that you wait before being seen by our physicians.       _____________________________________________________________  Should you have questions after your visit to [facility], please contact our office at [phone] between the hours of 8:30 a.m. and 4:30 p.m.  Voicemails left after 4:30 p.m. will not be returned until the following business day.  For prescription refill requests, have your pharmacy contact our office.

## 2015-06-06 NOTE — Progress Notes (Signed)
LABS DRAWN

## 2015-06-06 NOTE — Progress Notes (Signed)
Fernando Hampshire, Fernando [name] [place] [id number]  Carcinoma of the parotid gland, status post resection with positive margins treated with postop radiotherapy with additional surgery on 10/28/2012.   Hypothyroidism  CURRENT THERAPY:Observation  INTERVAL HISTORY: Fernando [name] 81 y.o. male returns for follow-up of his parotid gland carcinoma.  He complains about altered taste and dry.mouth. He wants to cut down on the number of physicians he is seeing regarding his cancer.  Coming to physicians is very stressful for him. He is here today with his son.  His son mentions that his energy levels are much better and he has begun to have some taste for salt and sugar. He has been doing better with soft foods. His son has been trying lots of different foods to figure out what works and what doesn't. His father has been doing well eating fish, vegetables, and heavily buttered toast. His son says that his ability to swallow has improved. They have used Boost/Ensure in the past and his son adds it to his diet whenever he notices his father losing weight.   He has been keeping active: mowing the yard, occasionally working with the horses, and going out with friends. His son notes that his father "picks at his scalp." He states the areas heal and then his father "aggravates them again." His son is aware of these spots and monitors them, mentioning that his sisters also monitor these.  His son mentions that scheduling all procedures to be done in one day would be best for them financially and emotionally, as coming to the doctor is very stressful for his father.   MEDICAL HISTORY: Past Medical History  Diagnosis Date  . Carotid artery disease     Dr. Kellie Simmering - s/p left CEA  . Hyperlipidemia   . Essential hypertension, benign   . Arthritis   . Atrial fibrillation   . Tachycardia-bradycardia syndrome   . 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 This mass was initially felt to be 1.7 cm at the time of presentation but had enlarged significantly to 4 cm clinically at the time of surgery. Pathologically it was 3.4 cm. A lymph node deep to the parotid was enlarged clinically at the time of surgery but was negative   . Coronary artery disease     has Occlusion and stenosis of carotid artery without mention of cerebral infarction; Atrial fibrillation; Essential hypertension, benign; Tachy-brady syndrome; Pacemaker-St.Jude; Squamous cell carcinoma of parotid; Carotid stenosis; and Aftercare following surgery of the circulatory system on his problem list.      Squamous cell carcinoma of parotid   10/28/2012 Initial Diagnosis Invasive squamous cell carcinoma of right parotid   11/17/2012 Cancer Staging PET scan, negative for metastatic disease   11/30/2012 - 01/11/2013 Radiation Therapy Dr. Orlene Erm   01/12/2013 Remission      has No Known Allergies.  Mr. Silsby does not currently have medications on file.  SURGICAL HISTORY: Past Surgical History  Procedure Laterality Date  . Cataract extraction w/ intraocular lens implant      Bilateral  . Pacemaker insertion  10-01-2012  . Insert / replace / remove pacemaker    . Parathyroidectomy  10/28/2012  . Parotidectomy  10/28/2012    Procedure: PAROTIDECTOMY;  Surgeon: Ascencion Dike, Fernando [name];  Location: [facility];  Service: ENT;  Laterality: Right;  Total Parotidectomy  . Eye  surgery    . Carotid endarterectomy Left Jan. 3, 2009    CE  . Permanent pacemaker insertion N/A 10/01/2012    Procedure: PERMANENT PACEMAKER INSERTION;  Surgeon: Evans Lance, Fernando [name];  Location: [facility] CATH LAB;  Service: Cardiovascular;  Laterality: N/A;    SOCIAL HISTORY: History   Social History  . Marital Status: Widowed    Spouse Name: N/A  . Number of Children: N/A  . Years of Education: N/A   Occupational History  . Not on file.    Social History Main Topics  . Smoking status: Former Smoker    Types: Cigarettes    Quit date: 11/25/1972  . Smokeless tobacco: Never Used  . Alcohol Use: 0.6 oz/week    1 Cans of beer per week     Comment: daily  . Drug Use: No  . Sexual Activity: No   Other Topics Concern  . Not on file   Social History Narrative    FAMILY HISTORY: Family History  Problem Relation Age of Onset  . Heart disease Sister   . Heart attack Sister   . Cirrhosis Mother   . Cancer Brother     brain cancer  . Cancer Sister     stomach cancer    Review of Systems  Constitutional: Negative.   HENT: Positive for hearing loss. Negative for congestion, ear discharge, ear pain, nosebleeds, sore throat and tinnitus.   Eyes: Negative.   Respiratory: Negative.  Negative for stridor.   Cardiovascular: Negative for chest pain, palpitations, orthopnea, claudication, leg swelling and PND.       Has a pacemaker.  Gastrointestinal: Negative.   Genitourinary: Negative.   Musculoskeletal: Negative.   Skin: Negative.   Neurological: Negative.  Negative for headaches.  Endo/Heme/Allergies: Negative.   Psychiatric/Behavioral: Negative.  Anxiety that is related to physician encounters 14 point review of systems was performed and is negative except as detailed under history of present illness and above  PHYSICAL EXAMINATION  ECOG PERFORMANCE STATUS: 0 - Asymptomatic  Filed Vitals:   06/06/15 1042  BP: 176/79  Pulse: 60  Temp: 97.6 F (36.4 C)  Resp: 16    Physical Exam  Constitutional: He is oriented to person, place, and time and well-developed, well-nourished, and in no distress.    HENT:  Head: Normocephalic and atraumatic.  Nose: Nose normal.  Mouth/Throat: Oropharynx is clear and moist. No oropharyngeal exudate.  Eyes: Conjunctivae and EOM are normal. Pupils are equal, round, and reactive to light. Right eye exhibits no discharge. Left eye exhibits no discharge. No scleral icterus.    Neck: Normal range of motion. Neck supple. No tracheal deviation present. No thyromegaly present.  Cardiovascular: Normal rate, regular rhythm and normal heart sounds.  Exam reveals no gallop and no friction rub.   No murmur heard. Pulmonary/Chest: Effort normal and breath sounds normal. He has no wheezes. He has no rales.  Abdominal: Soft. Bowel sounds are normal. He exhibits no distension and no mass. There is no tenderness. There is no rebound and no guarding.  Musculoskeletal: Normal range of motion. He exhibits no edema.  Lymphadenopathy:       Head (right side): Negative posterior auricular adenopathy. No submental, no submandibular, no tonsillar and no preauricular adenopathy present.       Head (left side): No submental, no submandibular, no tonsillar, no preauricular and no posterior auricular adenopathy present.    He has no cervical adenopathy.    He has no axillary adenopathy.  Neurological: He is  alert and oriented to person, place, and time. He has normal reflexes. No cranial nerve deficit. Gait normal. Coordination normal.  Skin: Skin is warm and dry. No rash noted.  Psychiatric: Mood, memory, affect and judgment normal.  Nursing note and vitals reviewed.   LABORATORY DATA:  CBC    Component Value Date/Time   WBC 5.9 06/14/2014 0859   RBC 4.58 06/14/2014 0859   RBC 4.58 06/14/2014 0859   HGB 14.8 06/14/2014 0859   HCT 43.8 06/14/2014 0859   PLT 167 06/14/2014 0859   MCV 95.6 06/14/2014 0859   MCH 32.3 06/14/2014 0859   MCHC 33.8 06/14/2014 0859   RDW 15.2 06/14/2014 0859   LYMPHSABS 1.6 06/14/2014 0859   MONOABS 0.8 06/14/2014 0859   EOSABS 0.5 06/14/2014 0859   BASOSABS 0.0 06/14/2014 0859   CMP     Component Value Date/Time   NA 144 06/14/2014 0859   K 4.7 06/14/2014 0859   CL 107 06/14/2014 0859   CO2 26 06/14/2014 0859   GLUCOSE 108* 06/14/2014 0859   BUN 24* 06/14/2014 0859   CREATININE 0.96 06/14/2014 0859   CREATININE 0.95 09/28/2012 1308    CALCIUM 8.8 06/14/2014 0859   PROT 7.3 06/14/2014 0859   ALBUMIN 3.7 06/14/2014 0859   AST 24 06/14/2014 0859   ALT 16 06/14/2014 0859   ALKPHOS 64 06/14/2014 0859   BILITOT 0.9 06/14/2014 0859   GFRNONAA 74* 06/14/2014 0859   GFRAA 86* 06/14/2014 0859      Ref Range 2wk ago    TSH 0.350 - 4.500 uIU/mL 4.279          RADIOGRAPHIC STUDIES: 06/23/2014 EXAM: CT NECK WITH CONTRAST IMPRESSION: Stable changes in the right parotid gland. No recurrent mass or adenopathy  Severe atherosclerotic disease with critical stenosis right internal carotid artery unchanged  Interval resolution of left thyroid cyst.   Electronically Signed  By: Franchot Gallo M.D.  On: 06/23/2014 09:55  EXAM: CT CHEST WITH CONTRAST  IMPRESSION: 1. No evidence of metastatic disease within the chest. 2. 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 This is better defined on the current study due to the inclusion of contrast. This could reflect a posttraumatic finding. Cardiothoracic surgical consultation should be considered as well as continued CT follow up. 3. Neck CT findings are dictated separately.   Electronically Signed  By: Camie Patience M.D.  On: 06/23/2014 09:25   ASSESSMENT and THERAPY PLAN:  Carcinoma of the parotid gland, status post resection with positive margins treated with postop radiotherapy with additional surgery on 10/28/2012.   Hypothyroidism  I discussed with the patient and his son that I feel that following up with an ENT physician is important. They again emphasize that they want to minimize physician visits. I have recommended a CT of the chest and neck to be performed in the next month they are agreeable to that. We will notify them of the results when available.  Been treating his hypothyroidism and given his current TSH I recommended no additional changes in his thyroid medication. We can repeat the TSH again in 6 months to assure stability.  If his TSH remains stable at that point I would check a yearly.  I will tentatively see him back in 6 months. Have advised to son of signs and symptoms of concern such as further weight loss, difficulty swallowing, and any unusual or new lesions in the neck or facial area.  Orders Placed This Encounter  Procedures  . CT Chest W Contrast  Standing Status: Future     Number of Occurrences:      Standing Expiration Date: 06/05/2016    Order Specific Question:  Reason for Exam (SYMPTOM  OR DIAGNOSIS REQUIRED)    Answer:  restaging head and neck cancer    Order Specific Question:  Preferred imaging location?    Answer:  [facility]  . CT Soft Tissue Neck W Contrast    Standing Status: Future     Number of Occurrences:      Standing Expiration Date: 09/05/2016    Order Specific Question:  Reason for Exam (SYMPTOM  OR DIAGNOSIS REQUIRED)    Answer:  restaging head and neck cancer    Order Specific Question:  Preferred imaging location?    Answer:  [facility]  . CBC with Differential    Standing Status: Future     Number of Occurrences:      Standing Expiration Date: 06/05/2016  . Comprehensive metabolic panel    Standing Status: Future     Number of Occurrences:      Standing Expiration Date: 06/05/2016  . TSH    Standing Status: Future     Number of Occurrences:      Standing Expiration Date: 06/05/2016     All questions were answered. The patient knows to call the clinic with any problems, questions or concerns. We can certainly see the patient much sooner if necessary.  This document serves as a record of services personally performed by Ancil Linsey, Fernando [name]. It was created on her behalf by Arlyce Harman, a trained medical scribe. The creation of this record is based on the scribe's personal observations and the provider's statements to them. This document has been checked and approved by the attending provider.  I have reviewed the above documentation for  accuracy and completeness, and I agree with the above. [place],[employer]

## 2015-06-07 ENCOUNTER — Telehealth (HOSPITAL_COMMUNITY): Payer: Self-pay | Admitting: *Deleted

## 2015-06-07 DIAGNOSIS — C07 Malignant neoplasm of parotid gland: Secondary | ICD-10-CM

## 2015-06-07 LAB — CUP PACEART REMOTE DEVICE CHECK
Battery Remaining Longevity: 75 mo
Battery Remaining Percentage: 73 %
Battery Voltage: 2.92 V
Brady Statistic AP VS Percent: 1 %
Brady Statistic AS VP Percent: 89 %
Brady Statistic AS VS Percent: 11 %
Date Time Interrogation Session: 20160711071015
Lead Channel Impedance Value: 440 Ohm
Lead Channel Impedance Value: 440 Ohm
Lead Channel Sensing Intrinsic Amplitude: 12 mV
Lead Channel Setting Pacing Amplitude: 2.5 V
Lead Channel Setting Pacing Amplitude: 3.5 V
Lead Channel Setting Pacing Pulse Width: 0.4 ms
Lead Channel Setting Sensing Sensitivity: 2 mV
MDC IDC STAT BRADY AP VP PERCENT: 1 %
MDC IDC STAT BRADY RA PERCENT PACED: 1 %
MDC IDC STAT BRADY RV PERCENT PACED: 89 %
Pulse Gen Model: 2210
Pulse Gen Serial Number: 7411696

## 2015-06-07 NOTE — Telephone Encounter (Signed)
Patient's son called and notified that we will need to recheck thyroid levels in 3 months.

## 2015-06-20 ENCOUNTER — Encounter (HOSPITAL_COMMUNITY): Payer: Self-pay | Admitting: Hematology & Oncology

## 2015-06-21 ENCOUNTER — Encounter: Payer: Self-pay | Admitting: *Deleted

## 2015-06-23 ENCOUNTER — Ambulatory Visit (HOSPITAL_COMMUNITY): Payer: Medicare Other

## 2015-08-05 ENCOUNTER — Other Ambulatory Visit (HOSPITAL_COMMUNITY): Payer: Self-pay | Admitting: Hematology & Oncology

## 2015-08-17 ENCOUNTER — Ambulatory Visit (INDEPENDENT_AMBULATORY_CARE_PROVIDER_SITE_OTHER): Payer: Medicare Other | Admitting: Otolaryngology

## 2015-09-06 ENCOUNTER — Ambulatory Visit (INDEPENDENT_AMBULATORY_CARE_PROVIDER_SITE_OTHER): Payer: Medicare Other | Admitting: *Deleted

## 2015-09-06 DIAGNOSIS — I495 Sick sinus syndrome: Secondary | ICD-10-CM

## 2015-09-06 NOTE — Progress Notes (Signed)
Remote pacemaker transmission.   

## 2015-09-07 ENCOUNTER — Encounter (HOSPITAL_COMMUNITY): Payer: Medicare Other | Attending: Hematology & Oncology

## 2015-09-07 DIAGNOSIS — C07 Malignant neoplasm of parotid gland: Secondary | ICD-10-CM | POA: Diagnosis not present

## 2015-09-07 LAB — T4, FREE: Free T4: 1.7 ng/dL — ABNORMAL HIGH (ref 0.61–1.12)

## 2015-09-07 LAB — TSH: TSH: 5.215 u[IU]/mL — ABNORMAL HIGH (ref 0.350–4.500)

## 2015-09-08 LAB — CUP PACEART REMOTE DEVICE CHECK
Battery Remaining Longevity: 67 mo
Battery Remaining Percentage: 65 %
Battery Voltage: 2.9 V
Brady Statistic AP VP Percent: 1 %
Brady Statistic AS VP Percent: 89 %
Date Time Interrogation Session: 20161012074239
Implantable Lead Implant Date: 20131107
Lead Channel Impedance Value: 440 Ohm
Lead Channel Setting Pacing Amplitude: 2.5 V
Lead Channel Setting Pacing Amplitude: 3.5 V
Lead Channel Setting Pacing Pulse Width: 0.4 ms
Lead Channel Setting Sensing Sensitivity: 2 mV
MDC IDC LEAD IMPLANT DT: 20131107
MDC IDC LEAD LOCATION: 753859
MDC IDC LEAD LOCATION: 753860
MDC IDC MSMT LEADCHNL RV IMPEDANCE VALUE: 440 Ohm
MDC IDC MSMT LEADCHNL RV SENSING INTR AMPL: 12 mV
MDC IDC STAT BRADY AP VS PERCENT: 1 %
MDC IDC STAT BRADY AS VS PERCENT: 11 %
MDC IDC STAT BRADY RA PERCENT PACED: 1 %
MDC IDC STAT BRADY RV PERCENT PACED: 89 %
Pulse Gen Model: 2210
Pulse Gen Serial Number: 7411696

## 2015-09-08 NOTE — Progress Notes (Signed)
Labs drawn

## 2015-09-12 ENCOUNTER — Other Ambulatory Visit (HOSPITAL_COMMUNITY): Payer: Self-pay

## 2015-09-12 ENCOUNTER — Encounter: Payer: Self-pay | Admitting: Cardiology

## 2015-09-12 DIAGNOSIS — C07 Malignant neoplasm of parotid gland: Secondary | ICD-10-CM

## 2015-10-05 ENCOUNTER — Encounter: Payer: Self-pay | Admitting: Family

## 2015-10-10 ENCOUNTER — Ambulatory Visit (INDEPENDENT_AMBULATORY_CARE_PROVIDER_SITE_OTHER): Payer: Medicare Other | Admitting: Family

## 2015-10-10 ENCOUNTER — Encounter: Payer: Self-pay | Admitting: Family

## 2015-10-10 ENCOUNTER — Ambulatory Visit (HOSPITAL_COMMUNITY)
Admission: RE | Admit: 2015-10-10 | Discharge: 2015-10-10 | Disposition: A | Discharge status: Home / Self Care | Payer: Medicare Other | Source: Ambulatory Visit | Attending: Family | Admitting: Family

## 2015-10-10 VITALS — BP 189/88 | HR 58 | Temp 97.8°F | Resp 16 | Ht 64.0 in | Wt 150.0 lb

## 2015-10-10 DIAGNOSIS — I771 Stricture of artery: Secondary | ICD-10-CM

## 2015-10-10 DIAGNOSIS — I6522 Occlusion and stenosis of left carotid artery: Secondary | ICD-10-CM | POA: Diagnosis present

## 2015-10-10 DIAGNOSIS — Z48812 Encounter for surgical aftercare following surgery on the circulatory system: Secondary | ICD-10-CM | POA: Diagnosis not present

## 2015-10-10 DIAGNOSIS — Z9889 Other specified postprocedural states: Secondary | ICD-10-CM | POA: Diagnosis not present

## 2015-10-10 DIAGNOSIS — I708 Atherosclerosis of other arteries: Secondary | ICD-10-CM | POA: Diagnosis not present

## 2015-10-10 NOTE — Progress Notes (Signed)
Established Carotid Patient   History of Present Illness  Fernando [name] is a 81 y.o. male  patient of Dr. Kellie Simmering who is s/p left carotid endarterectomy with resection of a redundant segment in 2009. He has a moderate right ICA stenosis which we have been following. He did have problems with cancer of the parotid gland in 2014 and has concluded treatment and is doing well, he has some loss of taste and decreased saliva production from his radiation treatment, per his son. He is physically active, chops wood, still shoes horses. The patient denies claudication symptoms with walking, denies non healing wounds. He is left hand dominant, states he has mild weakness in his left arm and hand only when working with his arm/hand, denies dizziness when raising arms above his head.  The patient denies any history of TIA or stroke symptoms, specifically the patient denies a history of amaurosis fugax or monocular blindness, denies a history unilateral of facial drooping, denies a history of hemiplegia, and denies a history of receptive or expressive aphasia.   The patient denies New Medical or Surgical History.  Pt Diabetic: No Pt smoker: former smoker, quit in 1974  Pt meds include: Statin : Yes ASA: Yes Other anticoagulants/antiplatelets: Xarelto, noted history of atrial fib   Past Medical History  Diagnosis Date  . Carotid artery disease     Dr. Kellie Simmering - s/p left CEA  . Hyperlipidemia   . Essential hypertension, benign   . Arthritis   . Atrial fibrillation   . Tachycardia-bradycardia syndrome   . 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 This mass was initially felt to be 1.7 cm at the time of presentation but had enlarged significantly to 4 cm clinically at the time of surgery. Pathologically it was 3.4 cm. A lymph node deep to the parotid was enlarged  clinically at the time of surgery but was negative   . Coronary artery disease     Social History Social History  Substance Use Topics  . Smoking status: Former Smoker    Types: Cigarettes    Quit date: 11/25/1972  . Smokeless tobacco: Never Used  . Alcohol Use: 0.6 oz/week    1 Cans of beer per week     Comment: daily    Family History Family History  Problem Relation Age of Onset  . Heart disease Sister   . Heart attack Sister   . Cirrhosis Mother   . Cancer Brother     brain cancer  . Cancer Sister     stomach cancer    Surgical History Past Surgical History  Procedure Laterality Date  . Cataract extraction w/ intraocular lens implant      Bilateral  . Pacemaker insertion  10-01-2012  . Insert / replace / remove pacemaker    . Parathyroidectomy  10/28/2012  . Parotidectomy  10/28/2012    Procedure: PAROTIDECTOMY;  Surgeon: Ascencion Dike, MD;  Location: [place];  Service: ENT;  Laterality: Right;  Total Parotidectomy  . Eye surgery    . Carotid endarterectomy Left Jan. 3, 2009    CE  . Permanent pacemaker insertion N/A 10/01/2012    Procedure: PERMANENT PACEMAKER INSERTION;  Surgeon: Evans Lance, MD;  Location: [name] CATH LAB;  Service: Cardiovascular;  Laterality: N/A;    No Known Allergies  Current Outpatient Prescriptions  Medication Sig Dispense Refill  . ARIPiprazole (ABILIFY) 20  MG tablet Take 20 mg by mouth daily.    Marland Kitchen atorvastatin (LIPITOR) 40 MG tablet Take 40 mg by mouth every morning.     . Glucosamine HCl 1500 MG TABS Take 1,500 mg by mouth every morning.    Marland Kitchen levothyroxine (SYNTHROID, LEVOTHROID) 25 MCG tablet TAKE 1 & 1/2 TABLETS BY MOUTH DAILY BEFORE BREAKFAST. 45 tablet 3  . losartan (COZAAR) 50 MG tablet Take 50 mg by mouth every morning.     . metoprolol tartrate (LOPRESSOR) 25 MG tablet TAKE 1 TABLET TWICE A DAY 180 tablet 0  . naproxen sodium (ALEVE) 220 MG tablet Take 440 mg by mouth every 12 (twelve) hours as needed. For headaches    . potassium  citrate (UROCIT-K) 10 MEQ (1080 MG) SR tablet Take 10 mEq by mouth 3 (three) times daily with meals.    . rivaroxaban (XARELTO) 20 MG TABS tablet Take 1 tablet (20 mg total) by mouth daily. 90 tablet 3   No current facility-administered medications for this visit.    Review of Systems : See HPI for pertinent positives and negatives.  Physical Examination  Filed Vitals:   10/10/15 1536 10/10/15 1539 10/10/15 1543 10/10/15 1545  BP: 192/86 163/87 173/83 189/88  Pulse: 60 60 60 58  Temp:  97.8 F (36.6 C)    TempSrc:  Oral    Resp:  16    Height:  5\' 4"  (1.626 m)    Weight:  150 lb (68.04 kg)    SpO2:  98%     Body mass index is 25.73 kg/(m^2).   General: WDWN male in NAD GAIT: normal Eyes: PERRLA Pulmonary: Non-labored, CTAB, no rales, rhonchi, or wheezes.  Cardiac: regular rhythm,no detected murmur. Pacemaker palpated left upper chest.  VASCULAR EXAM Carotid Bruits Right Left   Negative Positive   Aorta is not palpable. Radial pulses: 2+ palpable right and 1+ palpable left.      LE Pulses Right Left   POPLITEAL not palpable  not palpable   POSTERIOR TIBIAL not palpable  not palpable    DORSALIS PEDIS  ANTERIOR TIBIAL 1+ palpable  1+ palpable     Gastrointestinal: soft, nontender, BS WNL, no r/g,no palpable masses.  Musculoskeletal: No muscle atrophy/wasting. M/S 5/5 throughout, extremities without ischemic changes.  Neurologic: A&O X 3; Appropriate Affect, Speech is normal CN 2-12 intact except is hard of hearing, Pain and light touch intact in extremities, Motor exam as listed above.         Non-Invasive Vascular Imaging CAROTID DUPLEX 10/10/2015   Right ICA: 40 - 59 % stenosis. Left ICA: 1 - 39 % stenosis, CEA site. Velocity of 339 cm/s in the left proximal subclavian artery  with bidirectional left vertebral artery flow. Greater than 20 mm Hg difference in brachial pressures. No significant change in the bilateral carotid arteries or in the left subclavian artery steal findings compared to 10/07/14 exam.    Assessment: Fernando [name] is a 81 y.o. male who is s/p left carotid endarterectomy with resection of a redundant segment in 2009. He has no history of stroke or TIA. He has no steal symptoms in either hand/arm.   Today's carotid duplex suggests 40-59% right ICA stenosis and minimal left ICA stenosis which is the CEA site. Velocity of 339 cm/s in the left proximal subclavian artery with bidirectional left vertebral artery flow. Greater than 20 mm Hg difference in brachial pressures. No significant change in the bilateral carotid arteries or in the left subclavian artery steal findings  compared to 10/07/14 exam.    Plan: Follow-up in 1 year with Carotid Duplex scan.   I discussed in depth with the patient the nature of atherosclerosis, and emphasized the importance of maximal medical management including strict control of blood pressure, blood glucose, and lipid levels, obtaining regular exercise, and continued cessation of smoking.  The patient is aware that without maximal medical management the underlying atherosclerotic disease process will progress, limiting the benefit of any interventions. The patient was given information about stroke prevention and what symptoms should prompt the patient to seek immediate medical care. Thank you for allowing [place] to participate in this patient's care.  Clemon Chambers, RN, MSN, FNP-C [facility] Office: [phone]  Clinic Physician: Kellie Simmering  10/10/2015 3:20 PM

## 2015-10-10 NOTE — Progress Notes (Signed)
Filed Vitals:   10/10/15 1536 10/10/15 1539 10/10/15 1543 10/10/15 1545  BP: 192/86 163/87 173/83 189/88  Pulse: 60 60 60 58  Temp:  97.8 F (36.6 C)    TempSrc:  Oral    Resp:  16    Height:  5\' 4"  (1.626 m)    Weight:  150 lb (68.04 kg)    SpO2:  98%

## 2015-10-10 NOTE — Patient Instructions (Signed)
Stroke Prevention Some medical conditions and behaviors are associated with an increased chance of having a stroke. You may prevent a stroke by making healthy choices and managing medical conditions. HOW CAN I REDUCE MY RISK OF HAVING A STROKE?   Stay physically active. Get at least 30 minutes of activity on most or all days.  Do not smoke. It may also be helpful to avoid exposure to secondhand smoke.  Limit alcohol use. Moderate alcohol use is considered to be:  No more than 2 drinks per day for men.  No more than 1 drink per day for nonpregnant women.  Eat healthy foods. This involves:  Eating 5 or more servings of fruits and vegetables a day.  Making dietary changes that address high blood pressure (hypertension), high cholesterol, diabetes, or obesity.  Manage your cholesterol levels.  Making food choices that are high in fiber and low in saturated fat, trans fat, and cholesterol may control cholesterol levels.  Take any prescribed medicines to control cholesterol as directed by your health care provider.  Manage your diabetes.  Controlling your carbohydrate and sugar intake is recommended to manage diabetes.  Take any prescribed medicines to control diabetes as directed by your health care provider.  Control your hypertension.  Making food choices that are low in salt (sodium), saturated fat, trans fat, and cholesterol is recommended to manage hypertension.  Ask your health care provider if you need treatment to lower your blood pressure. Take any prescribed medicines to control hypertension as directed by your health care provider.  If you are 18-39 years of age, have your blood pressure checked every 3-5 years. If you are 40 years of age or older, have your blood pressure checked every year.  Maintain a healthy weight.  Reducing calorie intake and making food choices that are low in sodium, saturated fat, trans fat, and cholesterol are recommended to manage  weight.  Stop drug abuse.  Avoid taking birth control pills.  Talk to your health care provider about the risks of taking birth control pills if you are over 35 years old, smoke, get migraines, or have ever had a blood clot.  Get evaluated for sleep disorders (sleep apnea).  Talk to your health care provider about getting a sleep evaluation if you snore a lot or have excessive sleepiness.  Take medicines only as directed by your health care provider.  For some people, aspirin or blood thinners (anticoagulants) are helpful in reducing the risk of forming abnormal blood clots that can lead to stroke. If you have the irregular heart rhythm of atrial fibrillation, you should be on a blood thinner unless there is a good reason you cannot take them.  Understand all your medicine instructions.  Make sure that other conditions (such as anemia or atherosclerosis) are addressed. SEEK IMMEDIATE MEDICAL CARE IF:   You have sudden weakness or numbness of the face, arm, or leg, especially on one side of the body.  Your face or eyelid droops to one side.  You have sudden confusion.  You have trouble speaking (aphasia) or understanding.  You have sudden trouble seeing in one or both eyes.  You have sudden trouble walking.  You have dizziness.  You have a loss of balance or coordination.  You have a sudden, severe headache with no known cause.  You have new chest pain or an irregular heartbeat. Any of these symptoms may represent a serious problem that is an emergency. Do not wait to see if the symptoms will   go away. Get medical help at once. Call your local emergency services (911 in [place] Do not drive yourself to the hospital.   This information is not intended to replace advice given to you by your health care provider. Make sure you discuss any questions you have with your health care provider.   Document Released: 12/19/2004 Document Revised: 12/02/2014 Document Reviewed:  05/14/2013 Elsevier Interactive Patient Education 2016 [employer] Inc[employer]  

## 2015-11-06 ENCOUNTER — Encounter (HOSPITAL_COMMUNITY): Payer: Medicare Other | Attending: Hematology & Oncology

## 2015-11-06 DIAGNOSIS — C07 Malignant neoplasm of parotid gland: Secondary | ICD-10-CM | POA: Insufficient documentation

## 2015-11-06 LAB — TSH: TSH: 4.592 u[IU]/mL — ABNORMAL HIGH (ref 0.350–4.500)

## 2015-11-06 LAB — T4, FREE: FREE T4: 1.69 ng/dL — AB (ref 0.61–1.12)

## 2015-11-06 NOTE — Progress Notes (Signed)
Labs drawn

## 2015-11-13 ENCOUNTER — Other Ambulatory Visit (HOSPITAL_COMMUNITY): Payer: Self-pay | Admitting: Hematology & Oncology

## 2015-11-29 ENCOUNTER — Other Ambulatory Visit (HOSPITAL_COMMUNITY): Payer: Self-pay | Admitting: Oncology

## 2015-11-29 DIAGNOSIS — I10 Essential (primary) hypertension: Secondary | ICD-10-CM | POA: Diagnosis not present

## 2015-11-29 DIAGNOSIS — Z950 Presence of cardiac pacemaker: Secondary | ICD-10-CM | POA: Diagnosis not present

## 2015-11-29 DIAGNOSIS — I779 Disorder of arteries and arterioles, unspecified: Secondary | ICD-10-CM | POA: Diagnosis not present

## 2015-11-29 DIAGNOSIS — I482 Chronic atrial fibrillation: Secondary | ICD-10-CM | POA: Diagnosis not present

## 2015-12-07 ENCOUNTER — Ambulatory Visit (HOSPITAL_COMMUNITY): Payer: Medicare Other | Admitting: Hematology & Oncology

## 2015-12-07 ENCOUNTER — Other Ambulatory Visit (HOSPITAL_COMMUNITY): Payer: Medicare Other

## 2015-12-14 ENCOUNTER — Encounter: Payer: Self-pay | Admitting: Internal Medicine

## 2015-12-14 ENCOUNTER — Ambulatory Visit (INDEPENDENT_AMBULATORY_CARE_PROVIDER_SITE_OTHER): Payer: PPO | Admitting: Internal Medicine

## 2015-12-14 VITALS — BP 162/80 | HR 74 | Ht 64.0 in | Wt 150.0 lb

## 2015-12-14 DIAGNOSIS — Z950 Presence of cardiac pacemaker: Secondary | ICD-10-CM | POA: Diagnosis not present

## 2015-12-14 DIAGNOSIS — I10 Essential (primary) hypertension: Secondary | ICD-10-CM | POA: Diagnosis not present

## 2015-12-14 DIAGNOSIS — I4891 Unspecified atrial fibrillation: Secondary | ICD-10-CM | POA: Diagnosis not present

## 2015-12-14 LAB — CUP PACEART INCLINIC DEVICE CHECK
Battery Remaining Longevity: 78
Battery Voltage: 2.9 V
Brady Statistic RA Percent Paced: 0.01 %
Brady Statistic RV Percent Paced: 89 %
Implantable Lead Implant Date: 20131107
Implantable Lead Location: 753860
Lead Channel Impedance Value: 400 Ohm
Lead Channel Impedance Value: 412.5 Ohm
Lead Channel Sensing Intrinsic Amplitude: 0.2 mV
Lead Channel Sensing Intrinsic Amplitude: 12 mV
Lead Channel Setting Pacing Amplitude: 2.5 V
MDC IDC LEAD IMPLANT DT: 20131107
MDC IDC LEAD LOCATION: 753859
MDC IDC MSMT LEADCHNL RV PACING THRESHOLD AMPLITUDE: 0.75 V
MDC IDC MSMT LEADCHNL RV PACING THRESHOLD AMPLITUDE: 0.75 V
MDC IDC MSMT LEADCHNL RV PACING THRESHOLD PULSEWIDTH: 0.4 ms
MDC IDC MSMT LEADCHNL RV PACING THRESHOLD PULSEWIDTH: 0.4 ms
MDC IDC PG SERIAL: 7411696
MDC IDC SESS DTM: 20170119144002
MDC IDC SET LEADCHNL RA PACING AMPLITUDE: 3.5 V
MDC IDC SET LEADCHNL RV PACING PULSEWIDTH: 0.4 ms
MDC IDC SET LEADCHNL RV SENSING SENSITIVITY: 2 mV

## 2015-12-14 NOTE — Assessment & Plan Note (Signed)
His blood pressure is elevated today. He does have white coat HTN. I asked him to reduce his sodium intake.

## 2015-12-14 NOTE — Progress Notes (Signed)
HPI Mr. Weedman returns today for followup. He is a very pleasant 82 year old man with symptomatic tachybradycardia syndrome, persistent atrial fibrillation, and hypertension. He is status post insertion of a St. Jude dual-chamber pacemaker.  He denies palpitations, chest pain, or shortness of breath. No syncope. His energy level is good. He is still swinging a sledge hammer breaking up stumps for firewood.    No Known Allergies   Current Outpatient Prescriptions  Medication Sig Dispense Refill  . ARIPiprazole (ABILIFY) 20 MG tablet Take 20 mg by mouth daily.    Marland Kitchen atorvastatin (LIPITOR) 40 MG tablet Take 40 mg by mouth every morning.     Marland Kitchen levothyroxine (SYNTHROID, LEVOTHROID) 25 MCG tablet TAKE 1 & 1/2 TABLETS BY MOUTH DAILY BEFORE BREAKFAST. 45 tablet 3  . losartan (COZAAR) 50 MG tablet Take 50 mg by mouth every morning.     . metoprolol tartrate (LOPRESSOR) 25 MG tablet TAKE 1 TABLET TWICE A DAY 180 tablet 0  . naproxen sodium (ALEVE) 220 MG tablet Take 440 mg by mouth every 12 (twelve) hours as needed. For headaches    . potassium citrate (UROCIT-K) 10 MEQ (1080 MG) SR tablet Take 10 mEq by mouth 3 (three) times daily with meals.    . rivaroxaban (XARELTO) 20 MG TABS tablet Take 1 tablet (20 mg total) by mouth daily. 90 tablet 3  . Glucosamine HCl 1500 MG TABS Take 1,500 mg by mouth every morning. Reported on 12/14/2015     No current facility-administered medications for this visit.     Past Medical History  Diagnosis Date  . Carotid artery disease ([place])     Dr. Kellie Simmering - s/p left CEA  . Hyperlipidemia   . Essential hypertension, benign   . Arthritis   . Atrial fibrillation ([place])   . Tachycardia-bradycardia syndrome ([place])   . 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 This mass was initially felt to be 1.7 cm at the time of presentation but  had enlarged significantly to 4 cm clinically at the time of surgery. Pathologically it was 3.4 cm. A lymph node deep to the parotid was enlarged clinically at the time of surgery but was negative   . Coronary artery disease     ROS:   All systems reviewed and negative except as noted in the HPI.   Past Surgical History  Procedure Laterality Date  . Cataract extraction w/ intraocular lens implant      Bilateral  . Pacemaker insertion  10-01-2012  . Insert / replace / remove pacemaker    . Parathyroidectomy  10/28/2012  . Parotidectomy  10/28/2012    Procedure: PAROTIDECTOMY;  Surgeon: Ascencion Dike, MD;  Location: [facility];  Service: ENT;  Laterality: Right;  Total Parotidectomy  . Eye surgery    . Carotid endarterectomy Left Jan. 3, 2009    CE  . Permanent pacemaker insertion N/A 10/01/2012    Procedure: PERMANENT PACEMAKER INSERTION;  Surgeon: Evans Lance, MD;  Location: [facility];  Service: Cardiovascular;  Laterality: N/A;     Family History  Problem Relation Age of Onset  . Heart disease Sister   . Heart attack Sister   . Cirrhosis Mother   . Cancer Brother     brain cancer  . Cancer Sister     stomach cancer     Social History   Social History  .  Marital Status: Widowed    Spouse Name: N/A  . Number of Children: N/A  . Years of Education: N/A   Occupational History  . Not on file.   Social History Main Topics  . Smoking status: Former Smoker    Types: Cigarettes    Quit date: 11/25/1972  . Smokeless tobacco: Never Used  . Alcohol Use: 0.6 oz/week    1 Cans of beer per week     Comment: daily  . Drug Use: No  . Sexual Activity: No   Other Topics Concern  . Not on file   Social History Narrative     BP 162/80 mmHg  Pulse 74  Ht 5\' 4"  (1.626 m)  Wt 150 lb (68.04 kg)  BMI 25.73 kg/m2  SpO2 99%  Physical Exam:  Well appearing, elderly man, NAD HEENT: Unremarkable Neck:  7 cm JVD, no thyromegally Lungs:  Clearwith no wheezes, rales, or  rhonchi. HEART:  Regular rate rhythm, no murmurs, no rubs, no clicks Abd:  soft, positive bowel sounds, no organomegally, no rebound, no guarding Ext:  2 plus pulses, no edema, no cyanosis, no clubbing Skin:  No rashes no nodules Neuro:  CN II through XII intact, motor grossly intact  DEVICE  Normal device function.  See PaceArt for details.   Assess/Plan:

## 2015-12-14 NOTE — Assessment & Plan Note (Signed)
His St. Jude DDD PM is working normally. Will recheck in several months. 

## 2015-12-14 NOTE — Patient Instructions (Signed)
Your physician wants you to follow-up in: 12 Months with Dr. Lovena Le. You will receive a reminder letter in the mail two months in advance. If you don't receive a letter, please call our office to schedule the follow-up appointment.  Remote monitoring is used to monitor your Pacemaker of ICD from home. This monitoring reduces the number of office visits required to check your device to one time per year. It allows [place] to keep an eye on the functioning of your device to ensure it is working properly. You are scheduled for a device check from home on 03/14/16. You may send your transmission at any time that day. If you have a wireless device, the transmission will be sent automatically. After your physician reviews your transmission, you will receive a postcard with your next transmission date.   Your physician recommends that you continue on your current medications as directed. Please refer to the Current Medication list given to you today.  If you need a refill on your cardiac medications before your next appointment, please call your pharmacy.  Thank you for choosing [facility]!

## 2015-12-14 NOTE — Assessment & Plan Note (Signed)
He is chronically in atrial fib, and his rate is controlled. He will continue his blood thinner.

## 2015-12-26 ENCOUNTER — Encounter (HOSPITAL_COMMUNITY): Payer: Self-pay | Admitting: Hematology & Oncology

## 2015-12-26 ENCOUNTER — Encounter (HOSPITAL_COMMUNITY): Payer: PPO | Attending: Hematology & Oncology | Admitting: Hematology & Oncology

## 2015-12-26 ENCOUNTER — Encounter (HOSPITAL_BASED_OUTPATIENT_CLINIC_OR_DEPARTMENT_OTHER): Payer: PPO

## 2015-12-26 VITALS — BP 180/65 | HR 60 | Temp 97.7°F | Resp 20 | Wt 149.0 lb

## 2015-12-26 DIAGNOSIS — C07 Malignant neoplasm of parotid gland: Secondary | ICD-10-CM

## 2015-12-26 DIAGNOSIS — Z85818 Personal history of malignant neoplasm of other sites of lip, oral cavity, and pharynx: Secondary | ICD-10-CM

## 2015-12-26 DIAGNOSIS — E039 Hypothyroidism, unspecified: Secondary | ICD-10-CM

## 2015-12-26 DIAGNOSIS — F418 Other specified anxiety disorders: Secondary | ICD-10-CM

## 2015-12-26 LAB — COMPREHENSIVE METABOLIC PANEL
ALT: 15 U/L — ABNORMAL LOW (ref 17–63)
ANION GAP: 9 (ref 5–15)
AST: 27 U/L (ref 15–41)
Albumin: 4 g/dL (ref 3.5–5.0)
Alkaline Phosphatase: 69 U/L (ref 38–126)
BILIRUBIN TOTAL: 1 mg/dL (ref 0.3–1.2)
BUN: 24 mg/dL — ABNORMAL HIGH (ref 6–20)
CO2: 26 mmol/L (ref 22–32)
Calcium: 9.1 mg/dL (ref 8.9–10.3)
Chloride: 104 mmol/L (ref 101–111)
Creatinine, Ser: 1.25 mg/dL — ABNORMAL HIGH (ref 0.61–1.24)
GFR calc Af Amer: 58 mL/min — ABNORMAL LOW (ref >60)
GFR, EST NON AFRICAN AMERICAN: 50 mL/min — AB (ref >60)
Glucose, Bld: 119 mg/dL — ABNORMAL HIGH (ref 65–99)
POTASSIUM: 4.2 mmol/L (ref 3.5–5.1)
Sodium: 139 mmol/L (ref 135–145)
TOTAL PROTEIN: 7.6 g/dL (ref 6.5–8.1)

## 2015-12-26 LAB — CBC WITH DIFFERENTIAL/PLATELET
BASOS PCT: 1 %
Basophils Absolute: 0.1 10*3/uL (ref 0.0–0.1)
EOS PCT: 7 %
Eosinophils Absolute: 0.4 10*3/uL (ref 0.0–0.7)
HEMATOCRIT: 47.4 % (ref 39.0–52.0)
Hemoglobin: 16 g/dL (ref 13.0–17.0)
LYMPHS PCT: 26 %
Lymphs Abs: 1.6 10*3/uL (ref 0.7–4.0)
MCH: 32.1 pg (ref 26.0–34.0)
MCHC: 33.8 g/dL (ref 30.0–36.0)
MCV: 95.2 fL (ref 78.0–100.0)
MONO ABS: 0.8 10*3/uL (ref 0.1–1.0)
MONOS PCT: 13 %
Neutro Abs: 3.3 10*3/uL (ref 1.7–7.7)
Neutrophils Relative %: 53 %
PLATELETS: 195 10*3/uL (ref 150–400)
RBC: 4.98 MIL/uL (ref 4.22–5.81)
RDW: 13.8 % (ref 11.5–15.5)
WBC: 6.2 10*3/uL (ref 4.0–10.5)

## 2015-12-26 LAB — TSH: TSH: 5.085 u[IU]/mL — AB (ref 0.350–4.500)

## 2015-12-26 MED ORDER — LEVOTHYROXINE SODIUM 50 MCG PO TABS: 50.0000 ug | ORAL_TABLET | Freq: Every day | ORAL | Status: DC

## 2015-12-26 NOTE — Patient Instructions (Addendum)
[place] at [facility] Discharge Instructions  RECOMMENDATIONS MADE BY THE CONSULTANT AND ANY TEST RESULTS WILL BE SENT TO YOUR REFERRING PHYSICIAN.    Exam and discussion by Dr Whitney Muse today Labs in 2 months and labs in 6 months synthroid sent to Center 50 mch take 1 daily Return to see the doctor in 6 months Please call the clinic if you have any questions or concerns     Thank you for choosing [place] at [facility] to provide your oncology and hematology care.  To afford each patient quality time with our provider, please arrive at least 15 minutes before your scheduled appointment time.   Beginning January 23rd 2017 lab work for the [facility] will be done in the  Main lab at [facility] on 1st floor. If you have a lab appointment with the [facility] please come in thru the  Main Entrance and check in at the main information desk  You need to re-schedule your appointment should you arrive 10 or more minutes late.  We strive to give you quality time with our providers, and arriving late affects you and other patients whose appointments are after yours.  Also, if you no show three or more times for appointments you may be dismissed from the clinic at the providers discretion.     Again, thank you for choosing [facility].  Our hope is that these requests will decrease the amount of time that you wait before being seen by our physicians.       _____________________________________________________________  Should you have questions after your visit to [facility], please contact our office at [phone] between the hours of 8:30 a.m. and 4:30 p.m.  Voicemails left after 4:30 p.m. will not be returned until the following business day.  For prescription refill requests, have your pharmacy contact our office.

## 2015-12-26 NOTE — Progress Notes (Signed)
No PCP Per Patient Fernando [name] [id number]  Carcinoma of the parotid gland, status post resection with positive margins treated with postop radiotherapy with additional surgery on 10/28/2012.   Hypothyroidism  CURRENT THERAPY:Observation  INTERVAL HISTORY: Fernando [name] 82 y.o. male returns for follow-up of his parotid gland carcinoma.  He complains about altered taste and dry.mouth. He wants to cut down on the number of physicians he is seeing regarding his cancer.  Coming to physicians is very stressful for him. He is here today with his son.  Fernando [name] returns to the [facility] today with his son. When asked how they're doing lately, his son says they are hanging in there. Fernando [name] himself says "I feel all right, I guess," and is smiling. When asked if he's liking the cold weather, the patient says "It's sort of tough on me," and that he stays cold. He says that his appetite isn't too good, but he eats anyway, mentioning that he still can't taste anything. When asked if he is taking any new medicines, his son denies it.   Notably, Fernando [name] mentions "a growth" that came back where the cancer was. He says "it came up all at once and then went away all at once." He says it "came up right where they cut the tumor off on his cheek;" "it came up, then looked like it might have burst in two places," and went away. Fernando [name] says he hasn't noticed any new lumps of bumps anywhere else. His son notes that it "wasn't like a pimple or a blackhead," and that "it seems to have burst on its own, but he's a picker, so there may have been some picking involved that I didn't see." His son further adds that it "scabbed over and cleared up very quickly."  Fernando [name] confirms sleeping at night, but his son says "except for when he comes to the doctor." Fernando [name] comments on this, saying "I don't know why I get so stressed; I just hate to bother anybody." He says "that's the way  I am; you can't take me out of me."  Fernando [name] says that he is not complaining of anything new, and his son confirms this. Fernando [name] says that he has still not gone back to the ENT doctor or the radiation doctor.  For his physical exam, is able to ambulate to the exam chair. His son confirms that Fernando [name] gets around okay, that he's still out and about. Fernando [name] comments that he still works with horses and in his yard.  Fernando [name] says that he didn't take his thyroid medicine for eight days, and his son confirms that this was because he did not acquire a refill of his prescription. His son says that Fernando [name] failed to refill his meds because he has been "feeling strapped for cash lately," which he failed to tell his son until they were driving to the clinic today. His son expresses frustration that Fernando [name] did not let him know that he needed money for the prescription.   MEDICAL HISTORY: Past Medical History  Diagnosis Date  . Carotid artery disease ([place])     Dr. Kellie Simmering - s/p left CEA  . Hyperlipidemia   . Essential hypertension, benign   . Arthritis   . Atrial fibrillation ([place])   . Tachycardia-bradycardia syndrome ([place])   . 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 This mass was initially felt to be 1.7 cm at the time of presentation but had enlarged significantly to 4 cm clinically at the time of surgery. Pathologically it was 3.4 cm. A lymph node deep to the parotid was enlarged clinically at the time of surgery but was negative   . 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 Orlene Erm   01/12/2013 Remission      has No Known Allergies.  [name] does not currently have medications on file.  SURGICAL HISTORY: Past Surgical History  Procedure Laterality Date  . Cataract extraction w/ intraocular lens implant      Bilateral  . Pacemaker insertion  10-01-2012  . Insert / replace / remove pacemaker    . Parathyroidectomy  10/28/2012  . Parotidectomy  10/28/2012    Procedure: PAROTIDECTOMY;  Surgeon: Ascencion Dike, MD;  Location: [facility];  Service: ENT;  Laterality: Right;  Total Parotidectomy  . Eye surgery    . Carotid endarterectomy Left Jan. 3, 2009    CE  . Permanent pacemaker insertion N/A 10/01/2012    Procedure: PERMANENT PACEMAKER INSERTION;  Surgeon: Evans Lance, MD;  Location: [facility] CATH LAB;  Service: Cardiovascular;  Laterality: N/A;    SOCIAL HISTORY: Social History   Social History  . Marital Status: Widowed    Spouse Name: N/A  . Number of Children: N/A  . Years of Education: N/A   Occupational History  . Not on file.   Social History Main Topics  . Smoking status: Former Smoker    Types: Cigarettes    Quit date: 11/25/1972  . Smokeless tobacco: Never Used  . Alcohol Use: 0.6 oz/week    1 Cans of beer per week     Comment: daily  . Drug Use: No  . Sexual Activity: No   Other Topics Concern  . Not on file   Social History Narrative    FAMILY HISTORY: Family History  Problem Relation Age of Onset  . Heart disease Sister   . Heart attack Sister   . Cirrhosis Mother   . Cancer Brother     brain cancer  . Cancer Sister     stomach cancer    Review of Systems  Constitutional: Negative.   HENT: Positive for hearing loss.  Negative for congestion, ear discharge, ear pain, nosebleeds, sore throat and tinnitus.   Eyes: Negative.   Respiratory: Negative.  Negative for stridor.   Cardiovascular: Negative for chest pain, palpitations, orthopnea, claudication, leg swelling and PND.       Has a pacemaker.  Gastrointestinal: Negative.   Genitourinary: Negative.   Musculoskeletal: Negative.   Skin: Negative.   Neurological: Negative.  Negative for headaches.  Endo/Heme/Allergies: Negative.   Psychiatric/Behavioral: Negative.  Anxiety that is related to physician encounters 14 point review of systems was performed and is negative except as detailed under history of present illness and above    PHYSICAL EXAMINATION  ECOG  PERFORMANCE STATUS: 0 - Asymptomatic  Filed Vitals:   12/26/15 1004  BP: 180/65  Pulse: 60  Temp: 97.7 F (36.5 C)  Resp: 20    Physical Exam  Constitutional: He is oriented to person, place, and time and well-developed, well-nourished, and in no distress.    HENT:  Head: Normocephalic and atraumatic.  Nose: Nose normal.  Mouth/Throat: Oropharynx is clear and moist. No oropharyngeal exudate.  Eyes: Conjunctivae and EOM are normal. Pupils are equal, round, and reactive to light. Right eye exhibits no discharge. Left eye exhibits no discharge. No scleral icterus.  Neck: Normal range of motion. Neck supple. No tracheal deviation present. No thyromegaly present.  Cardiovascular: Normal rate, regular rhythm and normal heart sounds.  Exam reveals no gallop and no friction rub.   No murmur heard. Pulmonary/Chest: Effort normal and breath sounds normal. He has no wheezes. He has no rales.  Abdominal: Soft. Bowel sounds are normal. He exhibits no distension and no mass. There is no tenderness. There is no rebound and no guarding.  Musculoskeletal: Normal range of motion. He exhibits no edema.  Lymphadenopathy:       Head (right side): Negative posterior auricular adenopathy. No submental, no  submandibular, no tonsillar and no preauricular adenopathy present.       Head (left side): No submental, no submandibular, no tonsillar, no preauricular and no posterior auricular adenopathy present.    He has no cervical adenopathy.    He has no axillary adenopathy.  Neurological: He is alert and oriented to person, place, and time. He has normal reflexes. No cranial nerve deficit. Gait normal. Coordination normal.  Skin: Skin is warm and dry. No rash noted.  Psychiatric: Mood, memory, affect and judgment normal.  Nursing note and vitals reviewed.   LABORATORY DATA: I have reviewed the data as listed.  CBC    Component Value Date/Time   WBC 6.2 12/26/2015 0931   RBC 4.98 12/26/2015 0931   RBC 4.58 06/14/2014 0859   HGB 16.0 12/26/2015 0931   HCT 47.4 12/26/2015 0931   PLT 195 12/26/2015 0931   MCV 95.2 12/26/2015 0931   MCH 32.1 12/26/2015 0931   MCHC 33.8 12/26/2015 0931   RDW 13.8 12/26/2015 0931   LYMPHSABS 1.6 12/26/2015 0931   MONOABS 0.8 12/26/2015 0931   EOSABS 0.4 12/26/2015 0931   BASOSABS 0.1 12/26/2015 0931   CMP     Component Value Date/Time   NA 139 12/26/2015 0931   K 4.2 12/26/2015 0931   CL 104 12/26/2015 0931   CO2 26 12/26/2015 0931   GLUCOSE 119* 12/26/2015 0931   BUN 24* 12/26/2015 0931   CREATININE 1.25* 12/26/2015 0931   CREATININE 0.95 09/28/2012 1308   CALCIUM 9.1 12/26/2015 0931   PROT 7.6 12/26/2015 0931   ALBUMIN 4.0 12/26/2015 0931   AST 27 12/26/2015 0931   ALT 15* 12/26/2015 0931   ALKPHOS 69 12/26/2015 0931   BILITOT 1.0 12/26/2015 0931   GFRNONAA 50* 12/26/2015 0931   GFRAA 58* 12/26/2015 0931    Results for NYCHOLAS, PEOT (MRN [id number])    Ref. Range 12/26/2015 09:31  TSH Latest Ref Range: 0.350-4.500 uIU/mL 5.085 (H)   RADIOGRAPHIC STUDIES:   06/23/2014 EXAM: CT NECK WITH CONTRAST IMPRESSION: Stable changes in the right parotid gland. No recurrent mass or adenopathy  Severe atherosclerotic disease with critical  stenosis right internal carotid artery unchanged  Interval resolution of left thyroid cyst.   Electronically Signed  By: Franchot Gallo M.D.  On: 06/23/2014  09:55  EXAM: CT CHEST WITH CONTRAST  IMPRESSION: 1. No evidence of metastatic disease within the chest. 2. 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 This is better defined on the current study due to the inclusion of contrast. This could reflect a posttraumatic finding. Cardiothoracic surgical consultation should be considered as well as continued CT follow up. 3. Neck CT findings are dictated separately.   Electronically Signed  By: Camie Patience M.D.  On: 06/23/2014 09:25   ASSESSMENT and THERAPY PLAN:  Carcinoma of the parotid gland, status post resection with positive margins treated with postop radiotherapy with additional surgery on 10/28/2012.   Hypothyroidism  I discussed with the patient and his son that I feel that following up with an ENT physician is important. They again emphasize that they want to minimize physician visits. Physical Exam today is not concerning for recurrence. Weight is overall stable.  He is to restart his thyroid medication. He is to return for labs in 8 weeks, we will re-check his TSH, order a FT4, and get another CMP.  We will follow up with him in 6 months. I have encouraged the patient and his son to establish with a PCP. If the area on his scalp worsens they are advised to call. Currently, I feel it is benign.  Orders Placed This Encounter  Procedures  . TSH    Standing Status: Future     Number of Occurrences:      Standing Expiration Date: 12/25/2016  . T4, free    Standing Status: Future     Number of Occurrences:      Standing Expiration Date: 12/25/2016  . Comprehensive metabolic panel    Standing Status: Future     Number of Occurrences:      Standing Expiration Date: 12/25/2016  . CBC with Differential    Standing Status: Future     Number of  Occurrences:      Standing Expiration Date: 12/25/2016  . Comprehensive metabolic panel    Standing Status: Future     Number of Occurrences:      Standing Expiration Date: 12/25/2016  . TSH    Standing Status: Future     Number of Occurrences:      Standing Expiration Date: 12/25/2016     All questions were answered. The patient knows to call the clinic with any problems, questions or concerns. We can certainly see the patient much sooner if necessary.  This document serves as a record of services personally performed by Ancil Linsey, MD. It was created on her behalf by Toni Amend, a trained medical scribe. The creation of this record is based on the scribe's personal observations and the provider's statements to them. This document has been checked and approved by the attending provider.  I have reviewed the above documentation for accuracy and completeness, and I agree with the above. Molli Hazard, MD

## 2016-01-03 ENCOUNTER — Other Ambulatory Visit (HOSPITAL_COMMUNITY): Payer: Self-pay | Admitting: Oncology

## 2016-01-06 ENCOUNTER — Telehealth: Payer: Self-pay | Admitting: Physician Assistant

## 2016-01-06 ENCOUNTER — Other Ambulatory Visit: Payer: Self-pay | Admitting: Physician Assistant

## 2016-01-06 MED ORDER — RIVAROXABAN 20 MG PO TABS: 20.0000 mg | ORAL_TABLET | Freq: Every day | ORAL | Status: DC

## 2016-01-06 NOTE — Telephone Encounter (Signed)
Patient with chronic afib on Xarelto. He recently switched from [facility] to [facility] however his Xarelto did not have any refills. He just saw Dr. Lovena Le in Jan 2017 who recommended continue Xarelto, his new pharmacy has been trying to contact Dr. Tanna Furry office for refill since Monday however has not heard back, i have sent 1 year Rx of Xarelto to his new pharmacy  Signed, Almyra Deforest PA Pager: [phone]

## 2016-02-23 ENCOUNTER — Encounter (HOSPITAL_COMMUNITY): Payer: PPO | Attending: Hematology & Oncology

## 2016-02-23 DIAGNOSIS — C07 Malignant neoplasm of parotid gland: Secondary | ICD-10-CM | POA: Diagnosis not present

## 2016-02-23 LAB — COMPREHENSIVE METABOLIC PANEL
ALK PHOS: 68 U/L (ref 38–126)
ALT: 21 U/L (ref 17–63)
AST: 34 U/L (ref 15–41)
Albumin: 3.8 g/dL (ref 3.5–5.0)
Anion gap: 8 (ref 5–15)
BILIRUBIN TOTAL: 1 mg/dL (ref 0.3–1.2)
BUN: 25 mg/dL — AB (ref 6–20)
CALCIUM: 8.5 mg/dL — AB (ref 8.9–10.3)
CHLORIDE: 106 mmol/L (ref 101–111)
CO2: 24 mmol/L (ref 22–32)
CREATININE: 1.07 mg/dL (ref 0.61–1.24)
Glucose, Bld: 181 mg/dL — ABNORMAL HIGH (ref 65–99)
Potassium: 4 mmol/L (ref 3.5–5.1)
Sodium: 138 mmol/L (ref 135–145)
TOTAL PROTEIN: 6.9 g/dL (ref 6.5–8.1)

## 2016-02-23 LAB — TSH: TSH: 3.353 u[IU]/mL (ref 0.350–4.500)

## 2016-02-23 LAB — T4, FREE: FREE T4: 1.78 ng/dL — AB (ref 0.61–1.12)

## 2016-02-29 DIAGNOSIS — I779 Disorder of arteries and arterioles, unspecified: Secondary | ICD-10-CM | POA: Diagnosis not present

## 2016-02-29 DIAGNOSIS — Z23 Encounter for immunization: Secondary | ICD-10-CM | POA: Diagnosis not present

## 2016-02-29 DIAGNOSIS — I10 Essential (primary) hypertension: Secondary | ICD-10-CM | POA: Diagnosis not present

## 2016-02-29 DIAGNOSIS — Z950 Presence of cardiac pacemaker: Secondary | ICD-10-CM | POA: Diagnosis not present

## 2016-02-29 DIAGNOSIS — I482 Chronic atrial fibrillation: Secondary | ICD-10-CM | POA: Diagnosis not present

## 2016-03-14 ENCOUNTER — Ambulatory Visit (INDEPENDENT_AMBULATORY_CARE_PROVIDER_SITE_OTHER): Payer: PPO | Admitting: *Deleted

## 2016-03-14 DIAGNOSIS — I495 Sick sinus syndrome: Secondary | ICD-10-CM | POA: Diagnosis not present

## 2016-03-14 DIAGNOSIS — Z950 Presence of cardiac pacemaker: Secondary | ICD-10-CM | POA: Diagnosis not present

## 2016-03-14 NOTE — Progress Notes (Signed)
Remote pacemaker transmission.   

## 2016-04-19 ENCOUNTER — Encounter: Payer: Self-pay | Admitting: Cardiology

## 2016-04-19 LAB — CUP PACEART REMOTE DEVICE CHECK
Battery Remaining Longevity: 64 mo
Battery Remaining Percentage: 65 %
Brady Statistic AP VS Percent: 1 %
Date Time Interrogation Session: 20170420065104
Implantable Lead Location: 753859
Lead Channel Sensing Intrinsic Amplitude: 12 mV
Lead Channel Setting Pacing Amplitude: 3.5 V
Lead Channel Setting Sensing Sensitivity: 2 mV
MDC IDC LEAD IMPLANT DT: 20131107
MDC IDC LEAD IMPLANT DT: 20131107
MDC IDC LEAD LOCATION: 753860
MDC IDC MSMT BATTERY VOLTAGE: 2.9 V
MDC IDC MSMT LEADCHNL RA IMPEDANCE VALUE: 390 Ohm
MDC IDC MSMT LEADCHNL RV IMPEDANCE VALUE: 410 Ohm
MDC IDC SET LEADCHNL RV PACING AMPLITUDE: 2.5 V
MDC IDC SET LEADCHNL RV PACING PULSEWIDTH: 0.4 ms
MDC IDC STAT BRADY AP VP PERCENT: 1 %
MDC IDC STAT BRADY AS VP PERCENT: 96 %
MDC IDC STAT BRADY AS VS PERCENT: 3.6 %
MDC IDC STAT BRADY RA PERCENT PACED: 1 %
MDC IDC STAT BRADY RV PERCENT PACED: 96 %
Pulse Gen Model: 2210
Pulse Gen Serial Number: 7411696

## 2016-06-13 ENCOUNTER — Ambulatory Visit (INDEPENDENT_AMBULATORY_CARE_PROVIDER_SITE_OTHER): Payer: PPO | Admitting: *Deleted

## 2016-06-13 DIAGNOSIS — I495 Sick sinus syndrome: Secondary | ICD-10-CM

## 2016-06-13 NOTE — Progress Notes (Signed)
Remote pacemaker transmission.   

## 2016-06-14 ENCOUNTER — Encounter: Payer: Self-pay | Admitting: Cardiology

## 2016-06-14 LAB — CUP PACEART REMOTE DEVICE CHECK
Battery Remaining Longevity: 64 mo
Battery Voltage: 2.9 V
Brady Statistic AS VP Percent: 96 %
Brady Statistic AS VS Percent: 3.7 %
Date Time Interrogation Session: 20170720071948
Implantable Lead Implant Date: 20131107
Lead Channel Setting Pacing Pulse Width: 0.4 ms
Lead Channel Setting Sensing Sensitivity: 2 mV
MDC IDC LEAD IMPLANT DT: 20131107
MDC IDC LEAD LOCATION: 753859
MDC IDC LEAD LOCATION: 753860
MDC IDC MSMT BATTERY REMAINING PERCENTAGE: 65 %
MDC IDC MSMT LEADCHNL RA IMPEDANCE VALUE: 390 Ohm
MDC IDC MSMT LEADCHNL RV IMPEDANCE VALUE: 400 Ohm
MDC IDC MSMT LEADCHNL RV SENSING INTR AMPL: 12 mV
MDC IDC SET LEADCHNL RA PACING AMPLITUDE: 3.5 V
MDC IDC SET LEADCHNL RV PACING AMPLITUDE: 2.5 V
MDC IDC STAT BRADY AP VP PERCENT: 1 %
MDC IDC STAT BRADY AP VS PERCENT: 1 %
MDC IDC STAT BRADY RA PERCENT PACED: 1 %
MDC IDC STAT BRADY RV PERCENT PACED: 96 %
Pulse Gen Model: 2210
Pulse Gen Serial Number: 7411696

## 2016-06-24 ENCOUNTER — Encounter (HOSPITAL_BASED_OUTPATIENT_CLINIC_OR_DEPARTMENT_OTHER): Payer: PPO

## 2016-06-24 ENCOUNTER — Encounter (HOSPITAL_COMMUNITY): Payer: Self-pay | Admitting: Hematology & Oncology

## 2016-06-24 ENCOUNTER — Encounter (HOSPITAL_COMMUNITY): Payer: PPO | Attending: Hematology & Oncology | Admitting: Hematology & Oncology

## 2016-06-24 VITALS — BP 183/91 | HR 60 | Temp 97.6°F | Resp 18 | Wt 153.5 lb

## 2016-06-24 DIAGNOSIS — C07 Malignant neoplasm of parotid gland: Secondary | ICD-10-CM

## 2016-06-24 DIAGNOSIS — Z923 Personal history of irradiation: Secondary | ICD-10-CM | POA: Insufficient documentation

## 2016-06-24 DIAGNOSIS — Z87891 Personal history of nicotine dependence: Secondary | ICD-10-CM | POA: Insufficient documentation

## 2016-06-24 DIAGNOSIS — E039 Hypothyroidism, unspecified: Secondary | ICD-10-CM

## 2016-06-24 DIAGNOSIS — Z85818 Personal history of malignant neoplasm of other sites of lip, oral cavity, and pharynx: Secondary | ICD-10-CM | POA: Insufficient documentation

## 2016-06-24 DIAGNOSIS — Z808 Family history of malignant neoplasm of other organs or systems: Secondary | ICD-10-CM | POA: Insufficient documentation

## 2016-06-24 DIAGNOSIS — Z9889 Other specified postprocedural states: Secondary | ICD-10-CM | POA: Insufficient documentation

## 2016-06-24 LAB — COMPREHENSIVE METABOLIC PANEL
ALBUMIN: 4 g/dL (ref 3.5–5.0)
ALK PHOS: 66 U/L (ref 38–126)
ALT: 19 U/L (ref 17–63)
ANION GAP: 6 (ref 5–15)
AST: 31 U/L (ref 15–41)
BUN: 25 mg/dL — AB (ref 6–20)
CO2: 24 mmol/L (ref 22–32)
Calcium: 8.9 mg/dL (ref 8.9–10.3)
Chloride: 108 mmol/L (ref 101–111)
Creatinine, Ser: 0.96 mg/dL (ref 0.61–1.24)
GFR calc Af Amer: >60 mL/min (ref >60)
GFR calc non Af Amer: >60 mL/min (ref >60)
GLUCOSE: 104 mg/dL — AB (ref 65–99)
POTASSIUM: 4.9 mmol/L (ref 3.5–5.1)
SODIUM: 138 mmol/L (ref 135–145)
Total Bilirubin: 1.3 mg/dL — ABNORMAL HIGH (ref 0.3–1.2)
Total Protein: 8.2 g/dL — ABNORMAL HIGH (ref 6.5–8.1)

## 2016-06-24 LAB — CBC WITH DIFFERENTIAL/PLATELET
BASOS ABS: 0 10*3/uL (ref 0.0–0.1)
BASOS PCT: 1 %
EOS ABS: 0.4 10*3/uL (ref 0.0–0.7)
Eosinophils Relative: 9 %
HEMATOCRIT: 44.9 % (ref 39.0–52.0)
HEMOGLOBIN: 14.4 g/dL (ref 13.0–17.0)
Lymphocytes Relative: 20 %
Lymphs Abs: 0.9 10*3/uL (ref 0.7–4.0)
MCH: 29.9 pg (ref 26.0–34.0)
MCHC: 32.1 g/dL (ref 30.0–36.0)
MCV: 93.3 fL (ref 78.0–100.0)
MONOS PCT: 14 %
Monocytes Absolute: 0.7 10*3/uL (ref 0.1–1.0)
NEUTROS ABS: 2.7 10*3/uL (ref 1.7–7.7)
NEUTROS PCT: 56 %
Platelets: 163 10*3/uL (ref 150–400)
RBC: 4.81 MIL/uL (ref 4.22–5.81)
RDW: 14 % (ref 11.5–15.5)
WBC: 4.8 10*3/uL (ref 4.0–10.5)

## 2016-06-24 LAB — TSH: TSH: 5.964 u[IU]/mL — ABNORMAL HIGH (ref 0.350–4.500)

## 2016-06-24 NOTE — Patient Instructions (Signed)
[facility] at [facility] Discharge Instructions  RECOMMENDATIONS MADE BY THE CONSULTANT AND ANY TEST RESULTS WILL BE SENT TO YOUR REFERRING PHYSICIAN  Exam done and seen today by Dr. Penland Labs in 6 months Return to see the doctor in 6 months Please call the clinic if you have any questions or concerns   Thank you for choosing [facility] at [facility] to provide your oncology and hematology care.  To afford each patient quality time with our provider, please arrive at least 15 minutes before your scheduled appointment time.   Beginning January 23rd 2017 lab work for the Cancer Center will be done in the  Main lab at [facility] on 1st floor. If you have a lab appointment with the Cancer Center please come in thru the  Main Entrance and check in at the main information desk  You need to re-schedule your appointment should you arrive 10 or more minutes late.  We strive to give you quality time with our providers, and arriving late affects you and other patients whose appointments are after yours.  Also, if you no show three or more times for appointments you may be dismissed from the clinic at the providers discretion.     Again, thank you for choosing [facility].  Our hope is that these requests will decrease the amount of time that you wait before being seen by our physicians.       _____________________________________________________________  Should you have questions after your visit to  Cancer Center, please contact our office at [phone] between the hours of 8:30 a.m. and 4:30 p.m.  Voicemails left after 4:30 p.m. will not be returned until the following business day.  For prescription refill requests, have your pharmacy contact our office.         Resources For Cancer Patients and their Caregivers ? American Cancer Society: Can assist with transportation, wigs, general needs, runs Look Good Feel  Better.        1-888-227-6333 ? Cancer Care: Provides financial assistance, online support groups, medication/co-pay assistance.  1-800-813-HOPE (4673) ? [facility] Assists [place] cancer patients and their families through emotional , educational and financial support.  336-427-4357 ? [place] DSS Where to apply for food stamps, Medicaid and utility assistance. 336-342-1394 ? [facility]: Transportation to medical appointments. 336-347-2287 ? Social Security Administration: May apply for disability if have a Stage IV cancer. 336-342-7796 1-800-772-1213 ? [place] Aging, Disability and Transit Services: Assists with nutrition, care and transit needs. 336-349-2343  Cancer Center Support Programs: @10RELATIVEDAYS@ > Cancer Support Group  2nd Tuesday of the month 1pm-2pm, Journey Room  > Creative Journey  3rd Tuesday of the month 1130am-1pm, Journey Room  > Look Good Feel Better  1st Wednesday of the month 10am-12 noon, Journey Room (Call American Cancer Society to register 1-800-395-5775)   

## 2016-06-24 NOTE — Progress Notes (Signed)
No PCP Per Patient No address on file  Carcinoma of the parotid gland, status post resection with positive margins treated with postop radiotherapy with additional surgery on 10/28/2012.   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 Orlene Erm     01/12/2013 Remission          CURRENT THERAPY:Observation  INTERVAL HISTORY: Fernando [name] 82 y.o. male returns for follow-up of his parotid gland carcinoma.  He complains about altered taste and dry.mouth.Coming to physicians is very stressful for him. He is here today with his son.  I personally reviewed and went over laboratory studies with the patient.  The patient has met with a primary care physician. He has met with this physician about once or twice so far. His son cannot recall the name of this physician. The physician is located in the town of [place] near [place]. His son will call back with the name of this physician.  He continues to follow with cardiology. He has a Materials engineer.   His son reports his father's blood pressure seems to increase the mornings that he has doctor's appointments. This morning he was up at 6am very anxious.  The patient continues to help take care of his horses. He is able to eat but does not taste it. He continues to take his thyroid medication. He sleeps well.  He reports if he moves really quickly he becomes swimmy-headed. No other complaints today. He is actually more talkative than his usual.  MEDICAL HISTORY: Past Medical History:  Diagnosis Date  . Arthritis   . Atrial fibrillation ([place])   . Carotid artery disease ([place])    Dr. Kellie Simmering - s/p left CEA  . Coronary artery disease   . Essential hypertension, benign   . Hyperlipidemia     . 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 This mass was initially felt to be 1.7 cm at the time of presentation but had enlarged significantly to 4 cm clinically at the time of surgery. Pathologically it was 3.4 cm. A lymph node deep to the parotid was enlarged clinically at the time of surgery but was negative   . 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     has No Known Allergies.  [name] had no medications administered during this visit.  SURGICAL HISTORY: Past Surgical History:  Procedure Laterality Date  . CAROTID ENDARTERECTOMY Left Jan. 3, 2009   CE  . CATARACT EXTRACTION W/ INTRAOCULAR LENS IMPLANT     Bilateral  . EYE SURGERY    . INSERT / REPLACE / REMOVE PACEMAKER    . PACEMAKER INSERTION  10-01-2012  . PARATHYROIDECTOMY  10/28/2012  . PAROTIDECTOMY  10/28/2012   Procedure: PAROTIDECTOMY;  Surgeon: Ascencion Dike, MD;  Location: [facility];  Service: ENT;  Laterality: Right;  Total Parotidectomy  . PERMANENT PACEMAKER INSERTION  N/A 10/01/2012   Procedure: PERMANENT PACEMAKER INSERTION;  Surgeon: Evans Lance, MD;  Location: [facility] CATH LAB;  Service: Cardiovascular;  Laterality: N/A;    SOCIAL HISTORY: Social History   Social History  . Marital status: Widowed    Spouse name: N/A  . Number of children: N/A  . Years of education: N/A   Occupational History  . Not on file.   Social History Main Topics  . Smoking status: Former Smoker    Types: Cigarettes    Quit date: 11/25/1972  . Smokeless tobacco: Never  Used  . Alcohol use 0.6 oz/week    1 Cans of beer per week     Comment: daily  . Drug use: No  . Sexual activity: No   Other Topics Concern  . Not on file   Social History Narrative  . No narrative on file    FAMILY HISTORY: Family History  Problem Relation Age of Onset  . Heart disease Sister   . Heart attack Sister   . Cirrhosis Mother   . Cancer Brother     brain cancer  . Cancer Sister     stomach cancer    Review of Systems  Constitutional: Negative.   HENT: Positive for hearing loss. Negative for congestion, ear discharge, ear pain, nosebleeds, sore throat and tinnitus.   Eyes: Negative.   Respiratory: Negative.  Negative for stridor.   Cardiovascular: Negative for chest pain, palpitations, orthopnea, claudication, leg swelling and PND.       Has a pacemaker.  Gastrointestinal: Negative.   Genitourinary: Negative.   Musculoskeletal: Negative.   Skin: Negative.   Neurological: Negative.  Negative for headaches.  Endo/Heme/Allergies: Negative.   Psychiatric/Behavioral: Negative.  Anxiety that is related to physician encounters 14 point review of systems was performed and is negative except as detailed under history of present illness and above   PHYSICAL EXAMINATION  ECOG PERFORMANCE STATUS: 0 - Asymptomatic  Vitals:   06/24/16 1100  BP: (!) 183/91  Pulse: 60  Resp: 18  Temp: 97.6 F (36.4 C)    Physical Exam  Constitutional: He is oriented to person, place, and time and well-developed, well-nourished, and in no distress.  HENT:  Head: Normocephalic and atraumatic.  Nose: Nose normal.  Mouth/Throat: Oropharynx is clear and moist. No oropharyngeal exudate.  Eyes: Conjunctivae and EOM are normal. Pupils are equal, round, and reactive to light. Right eye exhibits no discharge. Left eye exhibits no discharge. No scleral icterus.  Neck: Normal range of motion. Neck supple. No tracheal deviation present. No thyromegaly present.  Cardiovascular: Normal  rate, regular rhythm and normal heart sounds.  Exam reveals no gallop and no friction rub.  Pacer in LU chest wall. No murmur heard. Pulmonary/Chest: Effort normal and breath sounds normal. He has no wheezes. He has no rales.  Abdominal: Soft. Bowel sounds are normal. He exhibits no distension and no mass. There is no tenderness. There is no rebound and no guarding.  Musculoskeletal: Normal range of motion. He exhibits no edema.  Lymphadenopathy:       Head (right side): Negative posterior auricular adenopathy. No submental, no submandibular, no tonsillar and no preauricular adenopathy present.       Head (left side): No submental, no submandibular, no tonsillar, no preauricular and no posterior auricular adenopathy present.    He has no cervical adenopathy.    He has no axillary adenopathy.  Neurological: He is alert and oriented to person, place, and time. He has normal reflexes.  No cranial nerve deficit. Gait normal. Coordination normal.  Skin: Skin is warm and dry. No rash noted.  Psychiatric: Mood, memory, affect and judgment normal.  Nursing note and vitals reviewed.   LABORATORY DATA: I have reviewed the data as listed.  CBC    Component Value Date/Time   WBC 4.8 06/24/2016 1125   RBC 4.81 06/24/2016 1125   HGB 14.4 06/24/2016 1125   HCT 44.9 06/24/2016 1125   PLT 163 06/24/2016 1125   MCV 93.3 06/24/2016 1125   MCH 29.9 06/24/2016 1125   MCHC 32.1 06/24/2016 1125   RDW 14.0 06/24/2016 1125   LYMPHSABS 0.9 06/24/2016 1125   MONOABS 0.7 06/24/2016 1125   EOSABS 0.4 06/24/2016 1125   BASOSABS 0.0 06/24/2016 1125   CMP     Component Value Date/Time   NA 138 06/24/2016 1125   K 4.9 06/24/2016 1125   CL 108 06/24/2016 1125   CO2 24 06/24/2016 1125   GLUCOSE 104 (H) 06/24/2016 1125   BUN 25 (H) 06/24/2016 1125   CREATININE 0.96 06/24/2016 1125   CREATININE 0.95 09/28/2012 1308   CALCIUM 8.9 06/24/2016 1125   PROT 8.2 (H) 06/24/2016 1125   ALBUMIN 4.0 06/24/2016 1125     AST 31 06/24/2016 1125   ALT 19 06/24/2016 1125   ALKPHOS 66 06/24/2016 1125   BILITOT 1.3 (H) 06/24/2016 1125   GFRNONAA >60 06/24/2016 1125   GFRAA >60 06/24/2016 1125     Lab Results  Component Value Date   TSH 5.964 (H) 06/24/2016    ASSESSMENT and THERAPY PLAN:  Carcinoma of the parotid gland, status post resection with positive margins treated with postop radiotherapy with additional surgery on 10/28/2012.  Hypothyroidism  Physical Exam today is not concerning for recurrence. Weight is overall stable.  TSH is up however he has periods where he goes without his medication.  I am going to repeat a TSH in 6 to 8 weeks and ensure that he is taking his medication as prescribed.   He has established care with a primary care physician. He cannot recall the name of this physician, but his son will call back with this physician's name.  He continues to follow with cardiology.   He will return for follow up in 6 months.   Orders Placed This Encounter  Procedures  . TSH    Standing Status:   Future    Standing Expiration Date:   07/25/2017  . CBC with Differential    Standing Status:   Future    Standing Expiration Date:   07/25/2017  . Comprehensive metabolic panel    Standing Status:   Future    Standing Expiration Date:   07/25/2017  . T4, free    Standing Status:   Future    Standing Expiration Date:   07/25/2017    All questions were answered. The patient knows to call the clinic with any problems, questions or concerns. We can certainly see the patient much sooner if necessary.  This document serves as a record of services personally performed by Ancil Linsey, MD. It was created on her behalf by Arlyce Harman, a trained medical scribe. The creation of this record is based on the scribe's personal observations and the provider's statements to them. This document has been checked and approved by the attending provider.  I have reviewed the above documentation for  accuracy and completeness, and I agree with the above. Molli Hazard, MD

## 2016-09-05 DIAGNOSIS — E039 Hypothyroidism, unspecified: Secondary | ICD-10-CM | POA: Diagnosis not present

## 2016-09-05 DIAGNOSIS — Z950 Presence of cardiac pacemaker: Secondary | ICD-10-CM | POA: Diagnosis not present

## 2016-09-05 DIAGNOSIS — I10 Essential (primary) hypertension: Secondary | ICD-10-CM | POA: Diagnosis not present

## 2016-09-05 DIAGNOSIS — Z23 Encounter for immunization: Secondary | ICD-10-CM | POA: Diagnosis not present

## 2016-09-05 DIAGNOSIS — I482 Chronic atrial fibrillation: Secondary | ICD-10-CM | POA: Diagnosis not present

## 2016-09-12 ENCOUNTER — Ambulatory Visit (INDEPENDENT_AMBULATORY_CARE_PROVIDER_SITE_OTHER): Payer: PPO | Admitting: *Deleted

## 2016-09-12 DIAGNOSIS — I495 Sick sinus syndrome: Secondary | ICD-10-CM | POA: Diagnosis not present

## 2016-09-12 NOTE — Progress Notes (Signed)
Remote pacemaker transmission.   

## 2016-09-13 ENCOUNTER — Encounter: Payer: Self-pay | Admitting: Cardiology

## 2016-10-09 ENCOUNTER — Encounter: Payer: Self-pay | Admitting: Family

## 2016-10-10 LAB — CUP PACEART REMOTE DEVICE CHECK
Battery Remaining Longevity: 65 mo
Battery Remaining Percentage: 65 %
Battery Voltage: 2.9 V
Brady Statistic RA Percent Paced: 1 %
Brady Statistic RV Percent Paced: 96 %
Implantable Lead Implant Date: 20131107
Implantable Lead Location: 753860
Lead Channel Sensing Intrinsic Amplitude: 0.2 mV
Lead Channel Setting Pacing Amplitude: 2.5 V
MDC IDC LEAD IMPLANT DT: 20131107
MDC IDC LEAD LOCATION: 753859
MDC IDC MSMT LEADCHNL RA IMPEDANCE VALUE: 440 Ohm
MDC IDC MSMT LEADCHNL RV IMPEDANCE VALUE: 450 Ohm
MDC IDC MSMT LEADCHNL RV PACING THRESHOLD AMPLITUDE: 0.75 V
MDC IDC MSMT LEADCHNL RV PACING THRESHOLD PULSEWIDTH: 0.4 ms
MDC IDC MSMT LEADCHNL RV SENSING INTR AMPL: 12 mV
MDC IDC PG IMPLANT DT: 20131107
MDC IDC PG SERIAL: 7411696
MDC IDC SESS DTM: 20171019075411
MDC IDC SET LEADCHNL RA PACING AMPLITUDE: 3.5 V
MDC IDC SET LEADCHNL RV PACING PULSEWIDTH: 0.4 ms
MDC IDC SET LEADCHNL RV SENSING SENSITIVITY: 2 mV
MDC IDC STAT BRADY AP VP PERCENT: 1 %
MDC IDC STAT BRADY AP VS PERCENT: 1 %
MDC IDC STAT BRADY AS VP PERCENT: 96 %
MDC IDC STAT BRADY AS VS PERCENT: 3.9 %

## 2016-10-15 ENCOUNTER — Ambulatory Visit: Payer: Medicare Other | Admitting: Family

## 2016-10-15 ENCOUNTER — Inpatient Hospital Stay (HOSPITAL_COMMUNITY): Admission: RE | Admit: 2016-10-15 | Payer: PPO | Source: Ambulatory Visit

## 2016-11-14 ENCOUNTER — Encounter: Payer: Self-pay | Admitting: Family

## 2016-11-26 ENCOUNTER — Ambulatory Visit (INDEPENDENT_AMBULATORY_CARE_PROVIDER_SITE_OTHER): Payer: PPO | Admitting: Family

## 2016-11-26 ENCOUNTER — Ambulatory Visit (HOSPITAL_COMMUNITY)
Admission: RE | Admit: 2016-11-26 | Discharge: 2016-11-26 | Disposition: A | Discharge status: Home / Self Care | Payer: PPO | Source: Ambulatory Visit | Attending: Family | Admitting: Family

## 2016-11-26 ENCOUNTER — Encounter: Payer: Self-pay | Admitting: Family

## 2016-11-26 VITALS — BP 133/79 | HR 62 | Temp 97.5°F | Resp 16 | Ht 64.0 in | Wt 155.7 lb

## 2016-11-26 DIAGNOSIS — Z48812 Encounter for surgical aftercare following surgery on the circulatory system: Secondary | ICD-10-CM

## 2016-11-26 DIAGNOSIS — I771 Stricture of artery: Secondary | ICD-10-CM | POA: Diagnosis not present

## 2016-11-26 DIAGNOSIS — Z9889 Other specified postprocedural states: Secondary | ICD-10-CM

## 2016-11-26 DIAGNOSIS — I6523 Occlusion and stenosis of bilateral carotid arteries: Secondary | ICD-10-CM | POA: Diagnosis not present

## 2016-11-26 NOTE — Progress Notes (Signed)
Chief Complaint: Follow up Extracranial Carotid Artery Stenosis   History of Present Illness  Fernando [name] is a 83 y.o. male patient of Dr. Kellie Simmering who is s/p left carotid endarterectomy with resection of a redundant segment in 2009. He has a moderate right ICA stenosis which we have been following. He did have problems with cancer of the parotid gland in 2014 and has concluded treatment and is doing well, he has some loss of taste and decreased saliva production from his radiation treatment. He is physically active, chops wood, does yard work. The patient denies claudication symptoms with walking, denies non healing wounds. He is left hand dominant, states he has mild weakness in his left arm and hand only when working with his arm/hand, denies dizziness when raising arms above his head.  The patient denies any history of TIA or stroke symptoms, specifically the patient denies a history of amaurosis fugax or monocular blindness, denies a history unilateral of facial drooping, denies a history of hemiplegia, and denies a history of receptive or expressive aphasia.   The patient denies New Medical or Surgical History.  Pt Diabetic: No Pt smoker: former smoker, quit in 1974  Pt meds include: Statin : Yes ASA: Yes Other anticoagulants/antiplatelets: Xarelto, noted history of atrial fib    Past Medical History:  Diagnosis Date  . Arthritis   . Atrial fibrillation ([place])   . Carotid artery disease ([place])    Dr. Kellie Simmering - s/p left CEA  . Coronary artery disease   . Essential hypertension, benign   . Hyperlipidemia   . 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 This mass was initially felt to be 1.7 cm at the time of presentation but had enlarged significantly to 4 cm clinically at the time of surgery. Pathologically it was 3.4 cm. A lymph  node deep to the parotid was enlarged clinically at the time of surgery but was negative   . Tachycardia-bradycardia syndrome [facility])     Social History Social History  Substance Use Topics  . Smoking status: Former Smoker    Types: Cigarettes    Quit date: 11/25/1972  . Smokeless tobacco: Never Used  . Alcohol use 0.6 oz/week    1 Cans of beer per week     Comment: daily    Family History Family History  Problem Relation Age of Onset  . Heart disease Sister   . Heart attack Sister   . Cirrhosis Mother   . Cancer Brother     brain cancer  . Cancer Sister     stomach cancer    Surgical History Past Surgical History:  Procedure Laterality Date  . CAROTID ENDARTERECTOMY Left Jan. 3, 2009   CE  . CATARACT EXTRACTION W/ INTRAOCULAR LENS IMPLANT     Bilateral  . EYE SURGERY    . INSERT / REPLACE / REMOVE PACEMAKER    . PACEMAKER INSERTION  10-01-2012  . PARATHYROIDECTOMY  10/28/2012  . PAROTIDECTOMY  10/28/2012   Procedure: PAROTIDECTOMY;  Surgeon: Ascencion Dike, MD;  Location: [facility];  Service: ENT;  Laterality: Right;  Total Parotidectomy  . PERMANENT PACEMAKER INSERTION N/A 10/01/2012   Procedure: PERMANENT PACEMAKER INSERTION;  Surgeon: Evans Lance, MD;  Location: [facility] CATH LAB;  Service: Cardiovascular;  Laterality: N/A;    No Known Allergies  Current Outpatient Prescriptions  Medication Sig Dispense Refill  . ARIPiprazole (ABILIFY)  20 MG tablet Take 20 mg by mouth daily.    Marland Kitchen atorvastatin (LIPITOR) 40 MG tablet Take 40 mg by mouth every morning.     . Glucosamine HCl 1500 MG TABS Take 1,500 mg by mouth every morning. Reported on 12/14/2015    . levothyroxine (SYNTHROID) 50 MCG tablet Take 1 tablet (50 mcg total) by mouth daily before breakfast. 30 tablet 3  . losartan (COZAAR) 50 MG tablet Take 50 mg by mouth every morning.     . metoprolol tartrate (LOPRESSOR) 25 MG tablet TAKE 1 TABLET TWICE A DAY 180 tablet 0  . naproxen sodium (ALEVE) 220 MG tablet Take 440 mg by mouth  every 12 (twelve) hours as needed. For headaches    . potassium citrate (UROCIT-K) 10 MEQ (1080 MG) SR tablet Take 10 mEq by mouth 3 (three) times daily with meals.    . rivaroxaban (XARELTO) 20 MG TABS tablet Take 1 tablet (20 mg total) by mouth daily. 90 tablet 3   No current facility-administered medications for this visit.     Review of Systems : See HPI for pertinent positives and negatives.  Physical Examination  Vitals:   11/26/16 0848 11/26/16 0851  BP: (!) 164/84 133/79  Pulse: 62   Resp: 16   Temp: 97.5 F (36.4 C)   TempSrc: Oral   SpO2: 98%   Weight: 155 lb 11.2 oz (70.6 kg)   Height: 5\' 4"  (1.626 m)    Body mass index is 26.73 kg/m.  General: WDWN male in NAD GAIT: normal Eyes: PERRLA Pulmonary: Respirations are non labored, CTAB, no rales, rhonchi, or wheezes.  Cardiac: Irregular rhythm,no detected murmur. Pacemaker palpated left upper chest.  VASCULAR EXAM Carotid Bruits Right Left   Negative Positive   Aorta is not palpable. Radial pulses: 3+ palpable right and 2+ palpable left.      LE Pulses Right Left   POPLITEAL not palpable  not palpable   POSTERIOR TIBIAL not palpable  not palpable    DORSALIS PEDIS  ANTERIOR TIBIAL 1+ palpable  1+ palpable     Gastrointestinal: soft, nontender, BS WNL, no r/g,no palpable masses.  Musculoskeletal: No muscle atrophy/wasting. M/S 5/5 throughout, extremities without ischemic changes. OA changes in hands.   Neurologic: A&O X 3; Appropriate Affect, Speech is normal CN 2-12 intact except is hard of hearing, Pain and light touch intact in extremities, Motor exam as listed above.    Assessment: Johua [name] is a 83 y.o. male who is s/p left carotid endarterectomy with resection of a redundant segment in 2009. He has no history  of stroke or TIA. He has no steal symptoms in either hand/arm.  I advised pt to see his PCP re his elevated blood pressure, states he has an appointment in 2 days.  His right arm is the accurate blood pressure since he has left subclavian stenosis.   DATA   Today's carotid duplex suggests 60-79% right proximal  ICA stenosis (232/72 cm/s) and minimal left ICA stenosis which is the CEA site. Left vertebral artery flow is bidirectional, left subclavian artery waveforms are monophasic. Right subclavian artery waveforms are biphasic.  Greater than 20 mm Hg difference in brachial pressures. Disease progression of the right ICA compared to the last carotid duplex on 10-10-15.     Plan: Follow-up in 6 months with Carotid Duplex scan.   I discussed in depth with the patient the nature of atherosclerosis, and emphasized the importance of maximal medical management including strict control of blood  pressure, blood glucose, and lipid levels, obtaining regular exercise, and continued cessation of smoking.  The patient is aware that without maximal medical management the underlying atherosclerotic disease process will progress, limiting the benefit of any interventions. The patient was given information about stroke prevention and what symptoms should prompt the patient to seek immediate medical care. Thank you for allowing [place] to participate in this patient's care.  Clemon Chambers, RN, MSN, FNP-C [facility] Office: [phone]  Clinic Physician: Early  11/26/16 9:45 AM

## 2016-11-26 NOTE — Patient Instructions (Signed)
Stroke Prevention Some medical conditions and behaviors are associated with an increased chance of having a stroke. You may prevent a stroke by making healthy choices and managing medical conditions. How can I reduce my risk of having a stroke?  Stay physically active. Get at least 30 minutes of activity on most or all days.  Do not smoke. It may also be helpful to avoid exposure to secondhand smoke.  Limit alcohol use. Moderate alcohol use is considered to be:  No more than 2 drinks per day for men.  No more than 1 drink per day for nonpregnant women.  Eat healthy foods. This involves:  Eating 5 or more servings of fruits and vegetables a day.  Making dietary changes that address high blood pressure (hypertension), high cholesterol, diabetes, or obesity.  Manage your cholesterol levels.  Making food choices that are high in fiber and low in saturated fat, trans fat, and cholesterol may control cholesterol levels.  Take any prescribed medicines to control cholesterol as directed by your health care provider.  Manage your diabetes.  Controlling your carbohydrate and sugar intake is recommended to manage diabetes.  Take any prescribed medicines to control diabetes as directed by your health care provider.  Control your hypertension.  Making food choices that are low in salt (sodium), saturated fat, trans fat, and cholesterol is recommended to manage hypertension.  Ask your health care provider if you need treatment to lower your blood pressure. Take any prescribed medicines to control hypertension as directed by your health care provider.  If you are 18-39 years of age, have your blood pressure checked every 3-5 years. If you are 40 years of age or older, have your blood pressure checked every year.  Maintain a healthy weight.  Reducing calorie intake and making food choices that are low in sodium, saturated fat, trans fat, and cholesterol are recommended to manage  weight.  Stop drug abuse.  Avoid taking birth control pills.  Talk to your health care provider about the risks of taking birth control pills if you are over 35 years old, smoke, get migraines, or have ever had a blood clot.  Get evaluated for sleep disorders (sleep apnea).  Talk to your health care provider about getting a sleep evaluation if you snore a lot or have excessive sleepiness.  Take medicines only as directed by your health care provider.  For some people, aspirin or blood thinners (anticoagulants) are helpful in reducing the risk of forming abnormal blood clots that can lead to stroke. If you have the irregular heart rhythm of atrial fibrillation, you should be on a blood thinner unless there is a good reason you cannot take them.  Understand all your medicine instructions.  Make sure that other conditions (such as anemia or atherosclerosis) are addressed. Get help right away if:  You have sudden weakness or numbness of the face, arm, or leg, especially on one side of the body.  Your face or eyelid droops to one side.  You have sudden confusion.  You have trouble speaking (aphasia) or understanding.  You have sudden trouble seeing in one or both eyes.  You have sudden trouble walking.  You have dizziness.  You have a loss of balance or coordination.  You have a sudden, severe headache with no known cause.  You have new chest pain or an irregular heartbeat. Any of these symptoms may represent a serious problem that is an emergency. Do not wait to see if the symptoms will go away.   Get medical help at once. Call your local emergency services (911 in [place] Do not drive yourself to the hospital. This information is not intended to replace advice given to you by your health care provider. Make sure you discuss any questions you have with your health care provider. Document Released: 12/19/2004 Document Revised: 04/18/2016 Document Reviewed: 05/14/2013 Elsevier  Interactive Patient Education  2017 Elsevier Inc.  

## 2016-11-27 NOTE — Addendum Note (Signed)
Addended by: Lianne Cure A on: 11/27/2016 11:15 AM   Modules accepted: Orders

## 2016-12-03 DIAGNOSIS — H9193 Unspecified hearing loss, bilateral: Secondary | ICD-10-CM | POA: Diagnosis not present

## 2016-12-03 DIAGNOSIS — E039 Hypothyroidism, unspecified: Secondary | ICD-10-CM | POA: Diagnosis not present

## 2016-12-03 DIAGNOSIS — I10 Essential (primary) hypertension: Secondary | ICD-10-CM | POA: Diagnosis not present

## 2016-12-03 DIAGNOSIS — I482 Chronic atrial fibrillation: Secondary | ICD-10-CM | POA: Diagnosis not present

## 2016-12-26 ENCOUNTER — Encounter (HOSPITAL_COMMUNITY): Payer: Self-pay

## 2016-12-26 ENCOUNTER — Encounter (HOSPITAL_COMMUNITY): Payer: PPO | Attending: Hematology & Oncology

## 2016-12-26 ENCOUNTER — Encounter (HOSPITAL_COMMUNITY): Payer: PPO | Attending: Oncology | Admitting: Oncology

## 2016-12-26 VITALS — BP 155/59 | HR 71 | Temp 98.2°F | Resp 18 | Wt 152.7 lb

## 2016-12-26 DIAGNOSIS — C07 Malignant neoplasm of parotid gland: Secondary | ICD-10-CM | POA: Insufficient documentation

## 2016-12-26 DIAGNOSIS — E039 Hypothyroidism, unspecified: Secondary | ICD-10-CM | POA: Diagnosis not present

## 2016-12-26 LAB — COMPREHENSIVE METABOLIC PANEL
ALT: 13 U/L — ABNORMAL LOW (ref 17–63)
ANION GAP: 9 (ref 5–15)
AST: 25 U/L (ref 15–41)
Albumin: 3.6 g/dL (ref 3.5–5.0)
Alkaline Phosphatase: 81 U/L (ref 38–126)
BUN: 24 mg/dL — ABNORMAL HIGH (ref 6–20)
CHLORIDE: 103 mmol/L (ref 101–111)
CO2: 25 mmol/L (ref 22–32)
Calcium: 9.3 mg/dL (ref 8.9–10.3)
Creatinine, Ser: 1.23 mg/dL (ref 0.61–1.24)
GFR, EST AFRICAN AMERICAN: 59 mL/min — AB (ref >60)
GFR, EST NON AFRICAN AMERICAN: 51 mL/min — AB (ref >60)
Glucose, Bld: 113 mg/dL — ABNORMAL HIGH (ref 65–99)
POTASSIUM: 4 mmol/L (ref 3.5–5.1)
Sodium: 137 mmol/L (ref 135–145)
TOTAL PROTEIN: 7.4 g/dL (ref 6.5–8.1)
Total Bilirubin: 0.9 mg/dL (ref 0.3–1.2)

## 2016-12-26 LAB — CBC WITH DIFFERENTIAL/PLATELET
Basophils Absolute: 0.1 10*3/uL (ref 0.0–0.1)
Basophils Relative: 1 %
EOS PCT: 6 %
Eosinophils Absolute: 0.4 10*3/uL (ref 0.0–0.7)
HCT: 41.1 % (ref 39.0–52.0)
Hemoglobin: 14 g/dL (ref 13.0–17.0)
LYMPHS ABS: 1.2 10*3/uL (ref 0.7–4.0)
LYMPHS PCT: 15 %
MCH: 32.1 pg (ref 26.0–34.0)
MCHC: 34.1 g/dL (ref 30.0–36.0)
MCV: 94.3 fL (ref 78.0–100.0)
MONO ABS: 0.8 10*3/uL (ref 0.1–1.0)
MONOS PCT: 10 %
Neutro Abs: 5.4 10*3/uL (ref 1.7–7.7)
Neutrophils Relative %: 68 %
PLATELETS: 261 10*3/uL (ref 150–400)
RBC: 4.36 MIL/uL (ref 4.22–5.81)
RDW: 14 % (ref 11.5–15.5)
WBC: 7.8 10*3/uL (ref 4.0–10.5)

## 2016-12-26 LAB — TSH: TSH: 4.259 u[IU]/mL (ref 0.350–4.500)

## 2016-12-26 LAB — T4, FREE: Free T4: 1.84 ng/dL — ABNORMAL HIGH (ref 0.61–1.12)

## 2016-12-26 NOTE — Patient Instructions (Signed)
[place] at [facility] Discharge Instructions  RECOMMENDATIONS MADE BY THE CONSULTANT AND ANY TEST RESULTS WILL BE SENT TO YOUR REFERRING PHYSICIAN.  You were seen today by Dr. Talbert Cage Follow up in clinic as needed.  Thank you for choosing [place] at [facility] to provide your oncology and hematology care.  To afford each patient quality time with our provider, please arrive at least 15 minutes before your scheduled appointment time.    If you have a lab appointment with the [facility] please come in thru the  Main Entrance and check in at the main information desk  You need to re-schedule your appointment should you arrive 10 or more minutes late.  We strive to give you quality time with our providers, and arriving late affects you and other patients whose appointments are after yours.  Also, if you no show three or more times for appointments you may be dismissed from the clinic at the providers discretion.     Again, thank you for choosing [facility].  Our hope is that these requests will decrease the amount of time that you wait before being seen by our physicians.       _____________________________________________________________  Should you have questions after your visit to [facility], please contact our office at [phone] between the hours of 8:30 a.m. and 4:30 p.m.  Voicemails left after 4:30 p.m. will not be returned until the following business day.  For prescription refill requests, have your pharmacy contact our office.       Resources For Cancer Patients and their Caregivers ? American Cancer Society: Can assist with transportation, wigs, general needs, runs Look Good Feel Better.        778-870-3466 ? Cancer Care: Provides financial assistance, online support groups, medication/co-pay assistance.  1-800-813-HOPE (331) 408-0272) ? [facility] Assists [place]  cancer patients and their families through emotional , educational and financial support.  (218)734-1040 ? [place] DSS Where to apply for food stamps, Medicaid and utility assistance. 4788332046 ? [facility]: Transportation to medical appointments. 309-562-7945 ? Social Security Administration: May apply for disability if have a Stage IV cancer. 601-361-2114 (810)302-4766 ? [employer], Disability and Transit Services: Assists with nutrition, care and transit needs. [place] Support Programs: @10RELATIVEDAYS @ > Cancer Support Group  2nd Tuesday of the month 1pm-2pm, Journey Room  > Creative Journey  3rd Tuesday of the month 1130am-1pm, Journey Room  > Look Good Feel Better  1st Wednesday of the month 10am-12 noon, Journey Room (Call [place] to register [phone])

## 2016-12-26 NOTE — Progress Notes (Signed)
Fernando [name],TESFAYE, Fernando [name] No address on file   PROGRESS NOTE  Carcinoma of the parotid gland, status post resection with positive margins treated with postop radiotherapy with additional surgery on 10/28/2012.   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 Orlene Erm      01/12/2013 Remission          CURRENT THERAPY:Observation  INTERVAL HISTORY: Fernando [name] 83 y.o. male returns for follow-up of his parotid gland carcinoma.    Patient presents today for continued follow up. He is accompanied by his daughter today.   He states he has been doing well since his last visit.  He has not been sick with any infections.   Denies fatigue, chest pain, abdominal pain.      MEDICAL HISTORY: Past Medical History:  Diagnosis Date  . Arthritis   . Atrial fibrillation ([place])   . Carotid artery disease ([place])    Dr. Kellie Simmering - s/p left CEA  . Coronary artery disease   . Essential hypertension, benign   . Hyperlipidemia   . 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 This mass was initially felt to be 1.7 cm at the time of presentation but had enlarged significantly to 4 cm clinically at the time of surgery. Pathologically it was 3.4 cm. A lymph node deep to the parotid was enlarged clinically at the time of surgery but was negative   . 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     has No Known Allergies.  Mr. Trexler had no medications administered during this visit.  SURGICAL HISTORY: Past Surgical History:  Procedure Laterality Date  . CAROTID ENDARTERECTOMY Left Jan. 3, 2009   CE  . CATARACT EXTRACTION W/ INTRAOCULAR LENS IMPLANT     Bilateral  . EYE SURGERY    . INSERT / REPLACE / REMOVE PACEMAKER    . PACEMAKER INSERTION  10-01-2012  . PARATHYROIDECTOMY  10/28/2012  . PAROTIDECTOMY  10/28/2012   Procedure: PAROTIDECTOMY;  Surgeon: Ascencion Dike, Fernando [name];  Location: [facility];  Service: ENT;  Laterality: Right;  Total Parotidectomy  . PERMANENT PACEMAKER INSERTION N/A 10/01/2012   Procedure: PERMANENT PACEMAKER INSERTION;  Surgeon: Evans Lance, Fernando [name];  Location[facility] LAB;  Service: Cardiovascular;  Laterality: N/A;    SOCIAL HISTORY: Social History   Social History  . Marital status: Widowed    Spouse name: N/A  . Number of children: N/A  . Years of education: N/A   Occupational History  . Not on file.   Social History Main Topics  . Smoking status: Former Smoker    Types: Cigarettes    Quit date: 11/25/1972  . Smokeless tobacco: Never Used  . Alcohol use 0.6 oz/week    1 Cans of beer per week     Comment: daily  . Drug use: No  . Sexual activity: No  Other Topics Concern  . Not on file   Social History Narrative  . No narrative on file    FAMILY HISTORY: Family History  Problem Relation Age of Onset  . Heart disease Sister   . Heart attack Sister   . Cirrhosis Mother   . Cancer Brother     brain cancer  . Cancer Sister     stomach cancer    Review of Systems  Constitutional: Negative.  Negative for malaise/fatigue.  HENT: Negative.   Eyes: Negative.   Respiratory: Negative.   Cardiovascular: Negative.  Negative for chest pain.  Gastrointestinal: Negative.  Negative for abdominal pain.  Genitourinary: Negative.   Musculoskeletal: Negative.   Skin:  Negative.   Neurological: Negative.   Endo/Heme/Allergies: Negative.   Psychiatric/Behavioral: Negative.   All other systems reviewed and are negative.   PHYSICAL EXAMINATION  ECOG PERFORMANCE STATUS: 0 - Asymptomatic  Vitals:   12/26/16 1305  BP: (!) 155/59  Pulse: 71  Resp: 18  Temp: 98.2 F (36.8 C)     Physical Exam  Constitutional: He is oriented to person, place, and time and well-developed, well-nourished, and in no distress.  HENT:  Head: Normocephalic and atraumatic.  Hard of hearing  Eyes: Conjunctivae and EOM are normal. Pupils are equal, round, and reactive to light.  Neck: Normal range of motion. Neck supple.  Cardiovascular: Normal rate, regular rhythm and normal heart sounds.   Pulmonary/Chest: Effort normal and breath sounds normal.  Abdominal: Soft. Bowel sounds are normal.  Musculoskeletal: Normal range of motion.  Neurological: He is alert and oriented to person, place, and time. Gait normal.  Skin: Skin is warm and dry.  Nursing note and vitals reviewed.   RADIOLOGY: I have reviewed the images below and agree with the reported results.   LABORATORY DATA: I have reviewed the data as listed.  CBC    Component Value Date/Time   WBC 7.8 12/26/2016 1225   RBC 4.36 12/26/2016 1225   HGB 14.0 12/26/2016 1225   HCT 41.1 12/26/2016 1225   PLT 261 12/26/2016 1225   MCV 94.3 12/26/2016 1225   MCH 32.1 12/26/2016 1225   MCHC 34.1 12/26/2016 1225   RDW 14.0 12/26/2016 1225   LYMPHSABS 1.2 12/26/2016 1225   MONOABS 0.8 12/26/2016 1225   EOSABS 0.4 12/26/2016 1225   BASOSABS 0.1 12/26/2016 1225   CMP     Component Value Date/Time   NA 137 12/26/2016 1225   K 4.0 12/26/2016 1225   CL 103 12/26/2016 1225   CO2 25 12/26/2016 1225   GLUCOSE 113 (H) 12/26/2016 1225   BUN 24 (H) 12/26/2016 1225   CREATININE 1.23 12/26/2016 1225   CREATININE 0.95 09/28/2012 1308   CALCIUM 9.3 12/26/2016 1225   PROT 7.4 12/26/2016 1225   ALBUMIN 3.6 12/26/2016  1225   AST 25 12/26/2016 1225   ALT 13 (L) 12/26/2016 1225   ALKPHOS 81 12/26/2016 1225   BILITOT 0.9 12/26/2016 1225   GFRNONAA 51 (L) 12/26/2016 1225   GFRAA 59 (L) 12/26/2016 1225     Lab Results  Component Value Date   TSH 4.259 12/26/2016    ASSESSMENT and THERAPY PLAN:  Carcinoma of the parotid gland, status post resection with positive margins treated with postop radiotherapy with additional surgery on 10/28/2012.  Hypothyroidism  Physical exam was negative today for reccurrence. Clinically NED.  He is now 5 years out from his initial diagnosis and likely cured.  I will turn his care over to his  PCP for his management of his hypothyroidism   He may return to clinic as needed.      No orders of the defined types were placed in this encounter.   All questions were answered. The patient knows to call the clinic with any problems, questions or concerns. We can certainly see the patient much sooner if necessary.  This document serves as a record of services personally performed by Twana First, Fernando [name]. It was created on her behalf by Shirlean Mylar, a trained medical scribe. The creation of this record is based on the scribe's personal observations and the provider's statements to them. This document has been checked and approved by the attending provider.   I have reviewed the above documentation for accuracy and completeness, and I agree with the above. [place]

## 2016-12-27 ENCOUNTER — Other Ambulatory Visit (HOSPITAL_COMMUNITY): Payer: Self-pay | Admitting: Emergency Medicine

## 2017-01-16 ENCOUNTER — Ambulatory Visit (INDEPENDENT_AMBULATORY_CARE_PROVIDER_SITE_OTHER): Payer: PPO | Admitting: Internal Medicine

## 2017-01-16 ENCOUNTER — Encounter: Payer: Self-pay | Admitting: Internal Medicine

## 2017-01-16 DIAGNOSIS — I4819 Other persistent atrial fibrillation: Secondary | ICD-10-CM

## 2017-01-16 DIAGNOSIS — I495 Sick sinus syndrome: Secondary | ICD-10-CM

## 2017-01-16 DIAGNOSIS — I481 Persistent atrial fibrillation: Secondary | ICD-10-CM

## 2017-01-16 NOTE — Progress Notes (Signed)
HPI Mr. Kupfer returns today for followup. He is a very pleasant 83 year old man with symptomatic tachybradycardia syndrome, persistent atrial fibrillation, and hypertension. He is status post insertion of a St. Jude dual-chamber pacemaker.  He denies palpitations, chest pain, or shortness of breath. No syncope. His energy level is good. Still cutting fire wood. No edema.    No Known Allergies   Current Outpatient Prescriptions  Medication Sig Dispense Refill  . ARIPiprazole (ABILIFY) 20 MG tablet Take 20 mg by mouth daily.    Marland Kitchen atorvastatin (LIPITOR) 40 MG tablet Take 40 mg by mouth every morning.     . Glucosamine HCl 1500 MG TABS Take 1,500 mg by mouth every morning. Reported on 12/14/2015    . levothyroxine (SYNTHROID) 50 MCG tablet Take 1 tablet (50 mcg total) by mouth daily before breakfast. 30 tablet 3  . losartan (COZAAR) 50 MG tablet Take 50 mg by mouth every morning.     . metoprolol tartrate (LOPRESSOR) 25 MG tablet TAKE 1 TABLET TWICE A DAY 180 tablet 0  . naproxen sodium (ALEVE) 220 MG tablet Take 440 mg by mouth every 12 (twelve) hours as needed. For headaches    . potassium citrate (UROCIT-K) 10 MEQ (1080 MG) SR tablet Take 10 mEq by mouth 3 (three) times daily with meals.    . rivaroxaban (XARELTO) 20 MG TABS tablet Take 1 tablet (20 mg total) by mouth daily. 90 tablet 3   No current facility-administered medications for this visit.      Past Medical History:  Diagnosis Date  . Arthritis   . Atrial fibrillation ([place])   . Carotid artery disease ([place])    Dr. Kellie Simmering - s/p left CEA  . Coronary artery disease   . Essential hypertension, benign   . Hyperlipidemia   . 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 This mass was initially felt to be 1.7 cm at the time of presentation but had enlarged significantly to 4 cm clinically at the  time of surgery. Pathologically it was 3.4 cm. A lymph node deep to the parotid was enlarged clinically at the time of surgery but was negative   . Tachycardia-bradycardia syndrome ([place])     ROS:   All systems reviewed and negative except as noted in the HPI.   Past Surgical History:  Procedure Laterality Date  . CAROTID ENDARTERECTOMY Left Jan. 3, 2009   CE  . CATARACT EXTRACTION W/ INTRAOCULAR LENS IMPLANT     Bilateral  . EYE SURGERY    . INSERT / REPLACE / REMOVE PACEMAKER    . PACEMAKER INSERTION  10-01-2012  . PARATHYROIDECTOMY  10/28/2012  . PAROTIDECTOMY  10/28/2012   Procedure: PAROTIDECTOMY;  Surgeon: Ascencion Dike, MD;  Location: [facility];  Service: ENT;  Laterality: Right;  Total Parotidectomy  . PERMANENT PACEMAKER INSERTION N/A 10/01/2012   Procedure: PERMANENT PACEMAKER INSERTION;  Surgeon: Evans Lance, MD;  Location: [facility] CATH LAB;  Service: Cardiovascular;  Laterality: N/A;     Family History  Problem Relation Age of Onset  . Heart disease Sister   . Heart attack Sister   . Cirrhosis Mother   . Cancer Brother     brain cancer  . Cancer Sister     stomach cancer     Social History   Social History  . Marital status: Widowed    Spouse name: N/A  .  Number of children: N/A  . Years of education: N/A   Occupational History  . Not on file.   Social History Main Topics  . Smoking status: Former Smoker    Types: Cigarettes    Quit date: 11/25/1972  . Smokeless tobacco: Never Used  . Alcohol use 0.6 oz/week    1 Cans of beer per week     Comment: daily  . Drug use: No  . Sexual activity: No   Other Topics Concern  . Not on file   Social History Narrative  . No narrative on file     BP 116/72   Pulse 72   Ht 5\' 4"  (1.626 m)   Wt 153 lb (69.4 kg)   SpO2 99%   BMI 26.26 kg/m   Physical Exam:  Well appearing, elderly man, NAD HEENT: Unremarkable Neck:  7 cm JVD, no thyromegally Lungs:  Clearwith no wheezes, rales, or rhonchi. HEART:  Regular  rate rhythm, no murmurs, no rubs, no clicks Abd:  soft, positive bowel sounds, no organomegally, no rebound, no guarding Ext:  2 plus pulses, no edema, no cyanosis, no clubbing Skin:  No rashes no nodules Neuro:  CN II through XII intact, motor grossly intact  DEVICE  Normal device function.  See PaceArt for details.   Assess/Plan: 1. PAF - he is now chronically in atrial fib but is asymptomatic. He will continue Xarelto. 2. HTN  - his blood pressure is well controlled today. 3. Carotid vascular disease - he is s/p left CEA. He is asymptomatic. 4. PPM - his St. Jude DDD PM is working normally. Will follow.  Mikle Bosworth.D.

## 2017-01-16 NOTE — Patient Instructions (Signed)
Your physician wants you to follow-up in: 1 Year with Dr.Taylor. You will receive a reminder letter in the mail two months in advance. If you don't receive a letter, please call our office to schedule the follow-up appointment.  Remote monitoring is used to monitor your Pacemaker of ICD from home. This monitoring reduces the number of office visits required to check your device to one time per year. It allows [place] to keep an eye on the functioning of your device to ensure it is working properly. You are scheduled for a device check from home on 04/17/17. You may send your transmission at any time that day. If you have a wireless device, the transmission will be sent automatically. After your physician reviews your transmission, you will receive a postcard with your next transmission date.  Your physician recommends that you continue on your current medications as directed. Please refer to the Current Medication list given to you today.  If you need a refill on your cardiac medications before your next appointment, please call your pharmacy.  Thank you for choosing [facility]!

## 2017-01-17 LAB — CUP PACEART INCLINIC DEVICE CHECK
Battery Remaining Longevity: 44 mo
Brady Statistic RA Percent Paced: 1 % — CL
Brady Statistic RV Percent Paced: 95 %
Implantable Lead Implant Date: 20131107
Implantable Lead Location: 753859
Implantable Lead Location: 753860
Lead Channel Impedance Value: 410 Ohm
Lead Channel Pacing Threshold Amplitude: 0.75 V
Lead Channel Pacing Threshold Pulse Width: 0.4 ms
Lead Channel Sensing Intrinsic Amplitude: 12 mV
Lead Channel Setting Pacing Amplitude: 2.5 V
Lead Channel Setting Sensing Sensitivity: 2 mV
MDC IDC LEAD IMPLANT DT: 20131107
MDC IDC MSMT BATTERY REMAINING PERCENTAGE: 57 %
MDC IDC MSMT BATTERY VOLTAGE: 2.89 V
MDC IDC MSMT LEADCHNL RA IMPEDANCE VALUE: 410 Ohm
MDC IDC MSMT LEADCHNL RA SENSING INTR AMPL: 0.2 mV
MDC IDC PG IMPLANT DT: 20131107
MDC IDC SESS DTM: 20180223094910
MDC IDC SET LEADCHNL RA PACING AMPLITUDE: 3.5 V
MDC IDC SET LEADCHNL RV PACING PULSEWIDTH: 0.4 ms
Pulse Gen Serial Number: 7411696

## 2017-01-28 DIAGNOSIS — H1131 Conjunctival hemorrhage, right eye: Secondary | ICD-10-CM | POA: Diagnosis not present

## 2017-04-17 ENCOUNTER — Ambulatory Visit (INDEPENDENT_AMBULATORY_CARE_PROVIDER_SITE_OTHER): Payer: PPO | Admitting: *Deleted

## 2017-04-17 DIAGNOSIS — I495 Sick sinus syndrome: Secondary | ICD-10-CM | POA: Diagnosis not present

## 2017-04-17 NOTE — Progress Notes (Signed)
Remote pacemaker transmission.   

## 2017-04-18 ENCOUNTER — Encounter: Payer: Self-pay | Admitting: Cardiology

## 2017-04-22 DIAGNOSIS — Z961 Presence of intraocular lens: Secondary | ICD-10-CM | POA: Diagnosis not present

## 2017-04-22 LAB — CUP PACEART REMOTE DEVICE CHECK
Battery Remaining Longevity: 54 mo
Battery Remaining Percentage: 57 %
Battery Voltage: 2.89 V
Brady Statistic AS VP Percent: 94 %
Date Time Interrogation Session: 20180524070358
Implantable Lead Location: 753860
Implantable Pulse Generator Implant Date: 20131107
Lead Channel Setting Pacing Amplitude: 2.5 V
Lead Channel Setting Pacing Amplitude: 3.5 V
Lead Channel Setting Pacing Pulse Width: 0.4 ms
MDC IDC LEAD IMPLANT DT: 20131107
MDC IDC LEAD IMPLANT DT: 20131107
MDC IDC LEAD LOCATION: 753859
MDC IDC MSMT LEADCHNL RA IMPEDANCE VALUE: 340 Ohm
MDC IDC MSMT LEADCHNL RV IMPEDANCE VALUE: 390 Ohm
MDC IDC MSMT LEADCHNL RV PACING THRESHOLD AMPLITUDE: 0.75 V
MDC IDC MSMT LEADCHNL RV PACING THRESHOLD PULSEWIDTH: 0.4 ms
MDC IDC MSMT LEADCHNL RV SENSING INTR AMPL: 12 mV
MDC IDC SET LEADCHNL RV SENSING SENSITIVITY: 2 mV
MDC IDC STAT BRADY AP VP PERCENT: 3.2 %
MDC IDC STAT BRADY AP VS PERCENT: 1 %
MDC IDC STAT BRADY AS VS PERCENT: 2.4 %
MDC IDC STAT BRADY RA PERCENT PACED: 4.1 %
MDC IDC STAT BRADY RV PERCENT PACED: 97 %
Pulse Gen Model: 2210
Pulse Gen Serial Number: 7411696

## 2017-05-26 ENCOUNTER — Encounter: Payer: Self-pay | Admitting: Family

## 2017-06-03 ENCOUNTER — Encounter (HOSPITAL_COMMUNITY): Payer: PPO

## 2017-06-03 ENCOUNTER — Ambulatory Visit: Payer: PPO | Admitting: Family

## 2017-06-04 DIAGNOSIS — E039 Hypothyroidism, unspecified: Secondary | ICD-10-CM | POA: Diagnosis not present

## 2017-06-04 DIAGNOSIS — I482 Chronic atrial fibrillation: Secondary | ICD-10-CM | POA: Diagnosis not present

## 2017-06-04 DIAGNOSIS — H9193 Unspecified hearing loss, bilateral: Secondary | ICD-10-CM | POA: Diagnosis not present

## 2017-06-04 DIAGNOSIS — Z950 Presence of cardiac pacemaker: Secondary | ICD-10-CM | POA: Diagnosis not present

## 2017-06-04 DIAGNOSIS — Z1389 Encounter for screening for other disorder: Secondary | ICD-10-CM | POA: Diagnosis not present

## 2017-07-17 ENCOUNTER — Ambulatory Visit (INDEPENDENT_AMBULATORY_CARE_PROVIDER_SITE_OTHER): Payer: PPO | Admitting: *Deleted

## 2017-07-17 DIAGNOSIS — I495 Sick sinus syndrome: Secondary | ICD-10-CM

## 2017-07-17 NOTE — Progress Notes (Signed)
Remote pacemaker transmission.   

## 2017-07-23 LAB — CUP PACEART REMOTE DEVICE CHECK
Battery Remaining Longevity: 47 mo
Brady Statistic AP VS Percent: 1 %
Brady Statistic AS VP Percent: 92 %
Brady Statistic AS VS Percent: 2.5 %
Brady Statistic RA Percent Paced: 6.7 %
Implantable Lead Implant Date: 20131107
Implantable Lead Location: 753860
Lead Channel Impedance Value: 340 Ohm
Lead Channel Pacing Threshold Amplitude: 0.75 V
Lead Channel Pacing Threshold Pulse Width: 0.4 ms
Lead Channel Sensing Intrinsic Amplitude: 0.2 mV
Lead Channel Setting Pacing Amplitude: 2.5 V
Lead Channel Setting Pacing Amplitude: 3.5 V
Lead Channel Setting Pacing Pulse Width: 0.4 ms
MDC IDC LEAD IMPLANT DT: 20131107
MDC IDC LEAD LOCATION: 753859
MDC IDC MSMT BATTERY REMAINING PERCENTAGE: 51 %
MDC IDC MSMT BATTERY VOLTAGE: 2.87 V
MDC IDC MSMT LEADCHNL RV IMPEDANCE VALUE: 350 Ohm
MDC IDC MSMT LEADCHNL RV SENSING INTR AMPL: 11.7 mV
MDC IDC PG IMPLANT DT: 20131107
MDC IDC PG SERIAL: 7411696
MDC IDC SESS DTM: 20180823073942
MDC IDC SET LEADCHNL RV SENSING SENSITIVITY: 2 mV
MDC IDC STAT BRADY AP VP PERCENT: 5.2 %
MDC IDC STAT BRADY RV PERCENT PACED: 97 %

## 2017-07-25 ENCOUNTER — Encounter: Payer: Self-pay | Admitting: Cardiology

## 2017-10-23 ENCOUNTER — Ambulatory Visit (INDEPENDENT_AMBULATORY_CARE_PROVIDER_SITE_OTHER): Payer: PPO | Admitting: *Deleted

## 2017-10-23 DIAGNOSIS — I495 Sick sinus syndrome: Secondary | ICD-10-CM

## 2017-10-23 NOTE — Progress Notes (Signed)
Remote pacemaker transmission.   

## 2017-10-24 ENCOUNTER — Encounter: Payer: Self-pay | Admitting: Cardiology

## 2017-11-04 LAB — CUP PACEART REMOTE DEVICE CHECK
Battery Voltage: 2.87 V
Brady Statistic AP VP Percent: 11 %
Brady Statistic AS VP Percent: 87 %
Brady Statistic AS VS Percent: 2.3 %
Date Time Interrogation Session: 20181129081201
Implantable Lead Implant Date: 20131107
Implantable Lead Location: 753859
Implantable Pulse Generator Implant Date: 20131107
Lead Channel Impedance Value: 390 Ohm
Lead Channel Pacing Threshold Pulse Width: 0.4 ms
Lead Channel Sensing Intrinsic Amplitude: 0.2 mV
Lead Channel Sensing Intrinsic Amplitude: 12 mV
Lead Channel Setting Pacing Amplitude: 3.5 V
Lead Channel Setting Pacing Pulse Width: 0.4 ms
Lead Channel Setting Sensing Sensitivity: 2 mV
MDC IDC LEAD IMPLANT DT: 20131107
MDC IDC LEAD LOCATION: 753860
MDC IDC MSMT BATTERY REMAINING LONGEVITY: 48 mo
MDC IDC MSMT BATTERY REMAINING PERCENTAGE: 51 %
MDC IDC MSMT LEADCHNL RV IMPEDANCE VALUE: 400 Ohm
MDC IDC MSMT LEADCHNL RV PACING THRESHOLD AMPLITUDE: 0.75 V
MDC IDC SET LEADCHNL RV PACING AMPLITUDE: 2.5 V
MDC IDC STAT BRADY AP VS PERCENT: 1 %
MDC IDC STAT BRADY RA PERCENT PACED: 13 %
MDC IDC STAT BRADY RV PERCENT PACED: 97 %
Pulse Gen Model: 2210
Pulse Gen Serial Number: 7411696

## 2017-12-03 DIAGNOSIS — I482 Chronic atrial fibrillation: Secondary | ICD-10-CM | POA: Diagnosis not present

## 2017-12-03 DIAGNOSIS — I10 Essential (primary) hypertension: Secondary | ICD-10-CM | POA: Diagnosis not present

## 2017-12-03 DIAGNOSIS — Z950 Presence of cardiac pacemaker: Secondary | ICD-10-CM | POA: Diagnosis not present

## 2017-12-03 DIAGNOSIS — E039 Hypothyroidism, unspecified: Secondary | ICD-10-CM | POA: Diagnosis not present

## 2018-01-09 ENCOUNTER — Encounter: Payer: PPO | Admitting: Internal Medicine

## 2018-01-22 ENCOUNTER — Ambulatory Visit (INDEPENDENT_AMBULATORY_CARE_PROVIDER_SITE_OTHER): Payer: PPO | Admitting: *Deleted

## 2018-01-22 DIAGNOSIS — I495 Sick sinus syndrome: Secondary | ICD-10-CM

## 2018-01-23 ENCOUNTER — Encounter: Payer: Self-pay | Admitting: Cardiology

## 2018-01-23 NOTE — Progress Notes (Signed)
Letter  

## 2018-01-23 NOTE — Progress Notes (Signed)
Remote pacemaker transmission.   

## 2018-02-10 LAB — CUP PACEART REMOTE DEVICE CHECK
Battery Remaining Longevity: 41 mo
Brady Statistic AP VP Percent: 15 %
Brady Statistic AP VS Percent: 1 %
Brady Statistic AS VP Percent: 83 %
Brady Statistic RV Percent Paced: 98 %
Date Time Interrogation Session: 20190228104143
Implantable Lead Implant Date: 20131107
Implantable Lead Location: 753860
Lead Channel Impedance Value: 400 Ohm
Lead Channel Pacing Threshold Amplitude: 0.75 V
Lead Channel Setting Pacing Amplitude: 2.5 V
MDC IDC LEAD IMPLANT DT: 20131107
MDC IDC LEAD LOCATION: 753859
MDC IDC MSMT BATTERY REMAINING PERCENTAGE: 46 %
MDC IDC MSMT BATTERY VOLTAGE: 2.86 V
MDC IDC MSMT LEADCHNL RA IMPEDANCE VALUE: 380 Ohm
MDC IDC MSMT LEADCHNL RA SENSING INTR AMPL: 0.2 mV
MDC IDC MSMT LEADCHNL RV PACING THRESHOLD PULSEWIDTH: 0.4 ms
MDC IDC MSMT LEADCHNL RV SENSING INTR AMPL: 12 mV
MDC IDC PG IMPLANT DT: 20131107
MDC IDC SET LEADCHNL RA PACING AMPLITUDE: 3.5 V
MDC IDC SET LEADCHNL RV PACING PULSEWIDTH: 0.4 ms
MDC IDC SET LEADCHNL RV SENSING SENSITIVITY: 2 mV
MDC IDC STAT BRADY AS VS PERCENT: 2 %
MDC IDC STAT BRADY RA PERCENT PACED: 18 %
Pulse Gen Model: 2210
Pulse Gen Serial Number: 7411696

## 2018-02-17 ENCOUNTER — Encounter: Payer: Self-pay | Admitting: Internal Medicine

## 2018-02-17 ENCOUNTER — Ambulatory Visit: Payer: PPO | Admitting: Internal Medicine

## 2018-02-17 VITALS — BP 134/64 | HR 71 | Ht 64.0 in | Wt 146.0 lb

## 2018-02-17 DIAGNOSIS — I480 Paroxysmal atrial fibrillation: Secondary | ICD-10-CM

## 2018-02-17 NOTE — Patient Instructions (Signed)
Medication Instructions:  Your physician recommends that you continue on your current medications as directed. Please refer to the Current Medication list given to you today.   Labwork: NONE   Testing/Procedures: NONE   Follow-Up: Your physician wants you to follow-up in: 1 Year with Dr. Taylor.  You will receive a reminder letter in the mail two months in advance. If you don't receive a letter, please call our office to schedule the follow-up appointment.   Any Other Special Instructions Will Be Listed Below (If Applicable).     If you need a refill on your cardiac medications before your next appointment, please call your pharmacy.   

## 2018-02-17 NOTE — Progress Notes (Signed)
HPI [name] returns today for follow-up of symptomatic tachybradycardia syndrome, persistent atrial fibrillation, and hypertension.  He is status post insertion of a Saint Jude dual-chamber pacemaker.  In the interim, he has been stable except that he has had gradual reduction in his exercise tolerance.  He has become more sedentary.  He denies anginal symptoms.  He still cuts wood although he is not doing as much as he once did. No Known Allergies   Current Outpatient Medications  Medication Sig Dispense Refill  . ARIPiprazole (ABILIFY) 20 MG tablet Take 20 mg by mouth daily.    Marland Kitchen atorvastatin (LIPITOR) 40 MG tablet Take 40 mg by mouth every morning.     . Glucosamine HCl 1500 MG TABS Take 1,500 mg by mouth every morning. Reported on 12/14/2015    . levothyroxine (SYNTHROID) 50 MCG tablet Take 1 tablet (50 mcg total) by mouth daily before breakfast. 30 tablet 3  . losartan (COZAAR) 50 MG tablet Take 50 mg by mouth every morning.     . metoprolol tartrate (LOPRESSOR) 25 MG tablet TAKE 1 TABLET TWICE A DAY 180 tablet 0  . naproxen sodium (ALEVE) 220 MG tablet Take 440 mg by mouth every 12 (twelve) hours as needed. For headaches    . potassium citrate (UROCIT-K) 10 MEQ (1080 MG) SR tablet Take 10 mEq by mouth 3 (three) times daily with meals.    . rivaroxaban (XARELTO) 20 MG TABS tablet Take 1 tablet (20 mg total) by mouth daily. 90 tablet 3   No current facility-administered medications for this visit.      Past Medical History:  Diagnosis Date  . Arthritis   . Atrial fibrillation ([place])   . Carotid artery disease ([place])    Dr. Kellie Simmering - s/p left CEA  . Coronary artery disease   . Essential hypertension, benign   . Hyperlipidemia   . 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 This mass was initially felt to be 1.7 cm at the time of  presentation but had enlarged significantly to 4 cm clinically at the time of surgery. Pathologically it was 3.4 cm. A lymph node deep to the parotid was enlarged clinically at the time of surgery but was negative   . Tachycardia-bradycardia syndrome ([place])     ROS:   All systems reviewed and negative except as noted in the HPI.   Past Surgical History:  Procedure Laterality Date  . CAROTID ENDARTERECTOMY Left Jan. 3, 2009   CE  . CATARACT EXTRACTION W/ INTRAOCULAR LENS IMPLANT     Bilateral  . EYE SURGERY    . INSERT / REPLACE / REMOVE PACEMAKER    . PACEMAKER INSERTION  10-01-2012  . PARATHYROIDECTOMY  10/28/2012  . PAROTIDECTOMY  10/28/2012   Procedure: PAROTIDECTOMY;  Surgeon: Ascencion Dike, MD;  Location: [facility];  Service: ENT;  Laterality: Right;  Total Parotidectomy  . PERMANENT PACEMAKER INSERTION N/A 10/01/2012   Procedure: PERMANENT PACEMAKER INSERTION;  Surgeon: Evans Lance, MD;  Location: [facility] CATH LAB;  Service: Cardiovascular;  Laterality: N/A;     Family History  Problem Relation Age of Onset  . Heart disease Sister   . Heart attack Sister   . Cirrhosis Mother   . Cancer Brother        brain cancer  . Cancer Sister        stomach  cancer     Social History   Socioeconomic History  . Marital status: Widowed    Spouse name: Not on file  . Number of children: Not on file  . Years of education: Not on file  . Highest education level: Not on file  Occupational History  . Not on file  Social Needs  . Financial resource strain: Not on file  . Food insecurity:    Worry: Not on file    Inability: Not on file  . Transportation needs:    Medical: Not on file    Non-medical: Not on file  Tobacco Use  . Smoking status: Former Smoker    Types: Cigarettes    Last attempt to quit: 11/25/1972    Years since quitting: 45.2  . Smokeless tobacco: Never Used  Substance and Sexual Activity  . Alcohol use: Yes    Alcohol/week: 0.6 oz    Types: 1 Cans of beer per week     Comment: daily  . Drug use: No  . Sexual activity: Never  Lifestyle  . Physical activity:    Days per week: Not on file    Minutes per session: Not on file  . Stress: Not on file  Relationships  . Social connections:    Talks on phone: Not on file    Gets together: Not on file    Attends religious service: Not on file    Active member of club or organization: Not on file    Attends meetings of clubs or organizations: Not on file    Relationship status: Not on file  . Intimate partner violence:    Fear of current or ex partner: Not on file    Emotionally abused: Not on file    Physically abused: Not on file    Forced sexual activity: Not on file  Other Topics Concern  . Not on file  Social History Narrative  . Not on file     BP 134/64 (BP Location: Right Arm)   Pulse 71   Ht 5\' 4"  (1.626 m)   Wt 146 lb (66.2 kg)   SpO2 99%   BMI 25.06 kg/m   Physical Exam:  Elderly appearing 84 year old man, NAD HEENT: Unremarkable Neck: 7 cm JVD, no thyromegally Lymphatics:  No adenopathy Back:  No CVA tenderness Lungs:  Clear, except for rales in the bases bilaterally.  Rare expiratory wheezes HEART:  IRegular rate rhythm, no murmurs, no rubs, no clicks Abd:  soft, positive bowel sounds, no organomegally, no rebound, no guarding Ext:  2 plus pulses, no edema, no cyanosis, no clubbing Skin:  No rashes no nodules Neuro:  CN II through XII intact, motor grossly intact  EKG -atrial fibrillation with ventricular pacing  DEVICE  Normal device function.  See PaceArt for details.   Assess/Plan: 1.  Persistent atrial fibrillation -his rate is well controlled.  No change in medical therapy. 2.  Pacemaker -his [place] dual-chamber pacemaker is working normally.  He is maintaining atrial fibrillation 98% of the time.  He has approximately 5 years of battery longevity. 3.  Hypertension -his blood pressure is recently well controlled.  No change in medical therapy at this  time.  Crissie Sickles, MD

## 2018-02-19 ENCOUNTER — Encounter: Payer: Self-pay | Admitting: Internal Medicine

## 2018-03-26 ENCOUNTER — Telehealth: Payer: Self-pay | Admitting: Internal Medicine

## 2018-03-26 ENCOUNTER — Encounter: Payer: Self-pay | Admitting: *Deleted

## 2018-03-26 DIAGNOSIS — I10 Essential (primary) hypertension: Secondary | ICD-10-CM | POA: Diagnosis not present

## 2018-03-26 DIAGNOSIS — E039 Hypothyroidism, unspecified: Secondary | ICD-10-CM | POA: Diagnosis not present

## 2018-03-26 DIAGNOSIS — Z950 Presence of cardiac pacemaker: Secondary | ICD-10-CM | POA: Diagnosis not present

## 2018-03-26 DIAGNOSIS — I482 Chronic atrial fibrillation: Secondary | ICD-10-CM | POA: Diagnosis not present

## 2018-03-26 NOTE — Telephone Encounter (Signed)
Spoke with daughter who states pt is fatigue. She reports diastolic BP of 48. When asked for a systolic BP daughter was unable to provide. Daughter reports that pt was seen today by PCP and labs were ordered. Recent office note and labs have been requested.

## 2018-03-26 NOTE — Telephone Encounter (Signed)
Please call [phone] to speak w/ son

## 2018-03-26 NOTE — Telephone Encounter (Signed)
Patient's son would like to speak with nurse regarding patient's BP readings and fatigue. / tg

## 2018-03-27 ENCOUNTER — Encounter (HOSPITAL_COMMUNITY): Payer: Self-pay | Admitting: Emergency Medicine

## 2018-03-27 ENCOUNTER — Inpatient Hospital Stay (HOSPITAL_COMMUNITY)
Admission: EM | Admit: 2018-03-27 | Discharge: 2018-03-29 | DRG: 378 | Disposition: A | Discharge status: Home / Self Care | Payer: PPO | Attending: Internal Medicine | Admitting: Internal Medicine

## 2018-03-27 ENCOUNTER — Other Ambulatory Visit: Payer: Self-pay

## 2018-03-27 ENCOUNTER — Other Ambulatory Visit (HOSPITAL_COMMUNITY)
Admission: RE | Admit: 2018-03-27 | Discharge: 2018-03-27 | Disposition: A | Discharge status: Home / Self Care | Payer: PPO | Source: Ambulatory Visit | Attending: Internal Medicine | Admitting: Internal Medicine

## 2018-03-27 DIAGNOSIS — Z85818 Personal history of malignant neoplasm of other sites of lip, oral cavity, and pharynx: Secondary | ICD-10-CM | POA: Diagnosis not present

## 2018-03-27 DIAGNOSIS — Z7982 Long term (current) use of aspirin: Secondary | ICD-10-CM | POA: Diagnosis not present

## 2018-03-27 DIAGNOSIS — D649 Anemia, unspecified: Secondary | ICD-10-CM | POA: Diagnosis not present

## 2018-03-27 DIAGNOSIS — I482 Chronic atrial fibrillation: Secondary | ICD-10-CM | POA: Diagnosis not present

## 2018-03-27 DIAGNOSIS — R634 Abnormal weight loss: Secondary | ICD-10-CM | POA: Diagnosis present

## 2018-03-27 DIAGNOSIS — N179 Acute kidney failure, unspecified: Secondary | ICD-10-CM | POA: Diagnosis present

## 2018-03-27 DIAGNOSIS — Z923 Personal history of irradiation: Secondary | ICD-10-CM | POA: Diagnosis not present

## 2018-03-27 DIAGNOSIS — I4891 Unspecified atrial fibrillation: Secondary | ICD-10-CM | POA: Diagnosis present

## 2018-03-27 DIAGNOSIS — N189 Chronic kidney disease, unspecified: Secondary | ICD-10-CM | POA: Diagnosis present

## 2018-03-27 DIAGNOSIS — K2971 Gastritis, unspecified, with bleeding: Secondary | ICD-10-CM | POA: Diagnosis present

## 2018-03-27 DIAGNOSIS — R1314 Dysphagia, pharyngoesophageal phase: Secondary | ICD-10-CM | POA: Diagnosis not present

## 2018-03-27 DIAGNOSIS — K922 Gastrointestinal hemorrhage, unspecified: Secondary | ICD-10-CM | POA: Diagnosis present

## 2018-03-27 DIAGNOSIS — K449 Diaphragmatic hernia without obstruction or gangrene: Secondary | ICD-10-CM | POA: Diagnosis not present

## 2018-03-27 DIAGNOSIS — F329 Major depressive disorder, single episode, unspecified: Secondary | ICD-10-CM | POA: Diagnosis not present

## 2018-03-27 DIAGNOSIS — I2510 Atherosclerotic heart disease of native coronary artery without angina pectoris: Secondary | ICD-10-CM | POA: Diagnosis present

## 2018-03-27 DIAGNOSIS — D62 Acute posthemorrhagic anemia: Secondary | ICD-10-CM | POA: Diagnosis present

## 2018-03-27 DIAGNOSIS — I10 Essential (primary) hypertension: Secondary | ICD-10-CM | POA: Diagnosis present

## 2018-03-27 DIAGNOSIS — K2270 Barrett's esophagus without dysplasia: Secondary | ICD-10-CM | POA: Diagnosis not present

## 2018-03-27 DIAGNOSIS — R531 Weakness: Secondary | ICD-10-CM | POA: Diagnosis not present

## 2018-03-27 DIAGNOSIS — Z8249 Family history of ischemic heart disease and other diseases of the circulatory system: Secondary | ICD-10-CM | POA: Diagnosis not present

## 2018-03-27 DIAGNOSIS — I129 Hypertensive chronic kidney disease with stage 1 through stage 4 chronic kidney disease, or unspecified chronic kidney disease: Secondary | ICD-10-CM | POA: Diagnosis not present

## 2018-03-27 DIAGNOSIS — K228 Other specified diseases of esophagus: Secondary | ICD-10-CM | POA: Diagnosis not present

## 2018-03-27 DIAGNOSIS — Z8589 Personal history of malignant neoplasm of other organs and systems: Secondary | ICD-10-CM | POA: Diagnosis not present

## 2018-03-27 DIAGNOSIS — Z950 Presence of cardiac pacemaker: Secondary | ICD-10-CM | POA: Diagnosis not present

## 2018-03-27 DIAGNOSIS — K254 Chronic or unspecified gastric ulcer with hemorrhage: Principal | ICD-10-CM | POA: Diagnosis present

## 2018-03-27 DIAGNOSIS — Z800 Family history of malignant neoplasm of digestive organs: Secondary | ICD-10-CM | POA: Diagnosis not present

## 2018-03-27 DIAGNOSIS — E785 Hyperlipidemia, unspecified: Secondary | ICD-10-CM | POA: Diagnosis present

## 2018-03-27 DIAGNOSIS — Z6824 Body mass index (BMI) 24.0-24.9, adult: Secondary | ICD-10-CM

## 2018-03-27 DIAGNOSIS — K2951 Unspecified chronic gastritis with bleeding: Secondary | ICD-10-CM | POA: Diagnosis not present

## 2018-03-27 DIAGNOSIS — Z66 Do not resuscitate: Secondary | ICD-10-CM | POA: Diagnosis present

## 2018-03-27 DIAGNOSIS — R1310 Dysphagia, unspecified: Secondary | ICD-10-CM

## 2018-03-27 DIAGNOSIS — Z7901 Long term (current) use of anticoagulants: Secondary | ICD-10-CM | POA: Diagnosis not present

## 2018-03-27 DIAGNOSIS — Z808 Family history of malignant neoplasm of other organs or systems: Secondary | ICD-10-CM

## 2018-03-27 DIAGNOSIS — K259 Gastric ulcer, unspecified as acute or chronic, without hemorrhage or perforation: Secondary | ICD-10-CM | POA: Diagnosis not present

## 2018-03-27 DIAGNOSIS — I6529 Occlusion and stenosis of unspecified carotid artery: Secondary | ICD-10-CM | POA: Diagnosis present

## 2018-03-27 DIAGNOSIS — R1319 Other dysphagia: Secondary | ICD-10-CM

## 2018-03-27 DIAGNOSIS — F32A Depression, unspecified: Secondary | ICD-10-CM | POA: Diagnosis present

## 2018-03-27 DIAGNOSIS — R5383 Other fatigue: Secondary | ICD-10-CM | POA: Diagnosis not present

## 2018-03-27 DIAGNOSIS — I495 Sick sinus syndrome: Secondary | ICD-10-CM | POA: Diagnosis present

## 2018-03-27 LAB — CBC WITH DIFFERENTIAL/PLATELET
BASOS ABS: 0.1 10*3/uL (ref 0.0–0.1)
Basophils Relative: 1 %
EOS PCT: 6 %
Eosinophils Absolute: 0.3 10*3/uL (ref 0.0–0.7)
HEMATOCRIT: 21.2 % — AB (ref 39.0–52.0)
HEMOGLOBIN: 6.4 g/dL — AB (ref 13.0–17.0)
LYMPHS ABS: 0.8 10*3/uL (ref 0.7–4.0)
LYMPHS PCT: 15 %
MCH: 21.5 pg — ABNORMAL LOW (ref 26.0–34.0)
MCHC: 30.2 g/dL (ref 30.0–36.0)
MCV: 71.1 fL — AB (ref 78.0–100.0)
MONOS PCT: 11 %
Monocytes Absolute: 0.6 10*3/uL (ref 0.1–1.0)
NEUTROS ABS: 3.3 10*3/uL (ref 1.7–7.7)
Neutrophils Relative %: 67 %
Platelets: 255 10*3/uL (ref 150–400)
RBC: 2.98 MIL/uL — AB (ref 4.22–5.81)
RDW: 18.4 % — ABNORMAL HIGH (ref 11.5–15.5)
WBC: 5.1 10*3/uL (ref 4.0–10.5)

## 2018-03-27 LAB — COMPREHENSIVE METABOLIC PANEL
ALT: 16 U/L — ABNORMAL LOW (ref 17–63)
ANION GAP: 13 (ref 5–15)
AST: 26 U/L (ref 15–41)
Albumin: 3.5 g/dL (ref 3.5–5.0)
Alkaline Phosphatase: 67 U/L (ref 38–126)
BILIRUBIN TOTAL: 1 mg/dL (ref 0.3–1.2)
BUN: 38 mg/dL — ABNORMAL HIGH (ref 6–20)
CHLORIDE: 104 mmol/L (ref 101–111)
CO2: 17 mmol/L — ABNORMAL LOW (ref 22–32)
Calcium: 8.8 mg/dL — ABNORMAL LOW (ref 8.9–10.3)
Creatinine, Ser: 1.49 mg/dL — ABNORMAL HIGH (ref 0.61–1.24)
GFR, EST AFRICAN AMERICAN: 46 mL/min — AB (ref >60)
GFR, EST NON AFRICAN AMERICAN: 40 mL/min — AB (ref >60)
Glucose, Bld: 112 mg/dL — ABNORMAL HIGH (ref 65–99)
POTASSIUM: 4.2 mmol/L (ref 3.5–5.1)
Sodium: 134 mmol/L — ABNORMAL LOW (ref 135–145)
TOTAL PROTEIN: 7.1 g/dL (ref 6.5–8.1)

## 2018-03-27 LAB — PHOSPHORUS: PHOSPHORUS: 4.4 mg/dL (ref 2.5–4.6)

## 2018-03-27 LAB — BASIC METABOLIC PANEL
ANION GAP: 11 (ref 5–15)
BUN: 38 mg/dL — AB (ref 6–20)
CHLORIDE: 106 mmol/L (ref 101–111)
CO2: 18 mmol/L — ABNORMAL LOW (ref 22–32)
Calcium: 8.9 mg/dL (ref 8.9–10.3)
Creatinine, Ser: 1.48 mg/dL — ABNORMAL HIGH (ref 0.61–1.24)
GFR calc Af Amer: 47 mL/min — ABNORMAL LOW (ref >60)
GFR, EST NON AFRICAN AMERICAN: 40 mL/min — AB (ref >60)
GLUCOSE: 115 mg/dL — AB (ref 65–99)
POTASSIUM: 4.3 mmol/L (ref 3.5–5.1)
Sodium: 135 mmol/L (ref 135–145)

## 2018-03-27 LAB — ABO/RH: ABO/RH(D): A POS

## 2018-03-27 LAB — MAGNESIUM: MAGNESIUM: 2 mg/dL (ref 1.7–2.4)

## 2018-03-27 LAB — PREPARE RBC (CROSSMATCH)

## 2018-03-27 LAB — POC OCCULT BLOOD, ED: Fecal Occult Bld: POSITIVE — AB

## 2018-03-27 MED ORDER — ARIPIPRAZOLE 10 MG PO TABS
20.0000 mg | ORAL_TABLET | Freq: Every day | ORAL | Status: DC
  Administered 2018-03-28 – 2018-03-29 (×2): 20 mg via ORAL
  Filled 2018-03-27 (×2): qty 2

## 2018-03-27 MED ORDER — SODIUM CHLORIDE 0.9 % IV BOLUS
1000.0000 mL | Freq: Once | INTRAVENOUS | Status: AC
  Administered 2018-03-27: 1000 mL via INTRAVENOUS

## 2018-03-27 MED ORDER — PANTOPRAZOLE SODIUM 40 MG IV SOLR: 40.0000 mg | Freq: Once | INTRAVENOUS | Status: DC

## 2018-03-27 MED ORDER — PANTOPRAZOLE SODIUM 40 MG PO TBEC: 40.0000 mg | DELAYED_RELEASE_TABLET | Freq: Two times a day (BID) | ORAL | Status: DC

## 2018-03-27 MED ORDER — ATORVASTATIN CALCIUM 40 MG PO TABS
40.0000 mg | ORAL_TABLET | Freq: Every day | ORAL | Status: DC
  Administered 2018-03-28: 40 mg via ORAL
  Filled 2018-03-27: qty 1

## 2018-03-27 MED ORDER — SODIUM CHLORIDE 0.9% FLUSH
3.0000 mL | Freq: Two times a day (BID) | INTRAVENOUS | Status: DC
  Administered 2018-03-27 – 2018-03-28 (×3): 3 mL via INTRAVENOUS

## 2018-03-27 MED ORDER — ENSURE ENLIVE PO LIQD: 237.0000 mL | Freq: Two times a day (BID) | ORAL | Status: DC

## 2018-03-27 MED ORDER — SODIUM CHLORIDE 0.9 % IV SOLN
Freq: Once | INTRAVENOUS | Status: AC
  Administered 2018-03-27: 15:00:00 via INTRAVENOUS

## 2018-03-27 MED ORDER — LEVOTHYROXINE SODIUM 50 MCG PO TABS
50.0000 ug | ORAL_TABLET | Freq: Every day | ORAL | Status: DC
  Administered 2018-03-28 – 2018-03-29 (×2): 50 ug via ORAL
  Filled 2018-03-27 (×2): qty 1

## 2018-03-27 MED ORDER — METOPROLOL TARTRATE 25 MG PO TABS
25.0000 mg | ORAL_TABLET | Freq: Two times a day (BID) | ORAL | Status: DC
  Administered 2018-03-27 – 2018-03-29 (×4): 25 mg via ORAL
  Filled 2018-03-27 (×4): qty 1

## 2018-03-27 MED ORDER — PANTOPRAZOLE SODIUM 40 MG IV SOLR
40.0000 mg | Freq: Two times a day (BID) | INTRAVENOUS | Status: DC
  Administered 2018-03-27: 40 mg via INTRAVENOUS
  Filled 2018-03-27 (×3): qty 40

## 2018-03-27 MED ORDER — FUROSEMIDE 10 MG/ML IJ SOLN
20.0000 mg | Freq: Once | INTRAMUSCULAR | Status: AC
  Administered 2018-03-27: 20 mg via INTRAVENOUS
  Filled 2018-03-27: qty 2

## 2018-03-27 MED ORDER — SODIUM CHLORIDE 0.9 % IV SOLN: 250.0000 mL | INTRAVENOUS | Status: DC | PRN

## 2018-03-27 MED ORDER — SODIUM CHLORIDE 0.9% FLUSH: 3.0000 mL | INTRAVENOUS | Status: DC | PRN

## 2018-03-27 NOTE — Consult Note (Signed)
Referring Provider: [facility] Primary Care Physician:  Rosita Fire, MD Primary Gastroenterologist:  Dr. Gala Romney  Date of Admission: 03/27/18 Date of Consultation: 03/27/18  Reason for Consultation:  Symptomatic anemia, GIB  HPI:  Fernando [name] is a 84 y.o. male with a past medical history of arthritis, atrial fibrillation on chronic anticoagulation, coronary artery disease, hyperlipidemia.  Emergency room provider note unavailable.  Per triage nurse, patient complaining of dark tarry stools and generalized fatigue x6 months.  Saw primary care yesterday with weakness and hemoglobin found to be 5.  Rechecked again today and found to be 6.  No history of GI bleed in the past.  On chronic Xarelto.  Omitted to proceed to the emergency department.  In the ER today hemoglobin found to be 6.4.  Heme positive on exam.  Appears to have chronic kidney disease, creatinine mildly elevated at 1.48 today (baseline appears to be 1.0-1.25).   Tachybradycardia syndrome.  Appears to have implantable pacemaker.  Today he states he began having dark stools about 2 to 3 months ago.  Began having weakness about 2 weeks ago.  He is accompanied by his daughter and son.  They say that his weakness got bad enough that he needed assistance to walk.  Denies history of ulcers, denies ever having EGD.  Denies GERD symptoms.  His son states that he has been having some dysphasia symptoms.  He also has noted to them that he has a globus sensation.  History of radiation treatment for head and neck cancer approximately 7 years ago.  Takes intermittent Advil, as needed for headache, averages every 1 to 2 weeks.  No other NSAIDs.  Denies abdominal pain, nausea, vomiting, hematochezia.  No other GI complaints.  Past Medical History:  Diagnosis Date  . Arthritis   . Atrial fibrillation ([place])   . Carotid artery disease ([place])    Dr. Kellie Simmering - s/p left CEA  . Coronary artery disease   . Essential hypertension, benign   .  Hyperlipidemia   . 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 This mass was initially felt to be 1.7 cm at the time of presentation but had enlarged significantly to 4 cm clinically at the time of surgery. Pathologically it was 3.4 cm. A lymph node deep to the parotid was enlarged clinically at the time of surgery but was negative   . Tachycardia-bradycardia syndrome [facility])     Past Surgical History:  Procedure Laterality Date  . CAROTID ENDARTERECTOMY Left Jan. 3, 2009   CE  . CATARACT EXTRACTION W/ INTRAOCULAR LENS IMPLANT     Bilateral  . EYE SURGERY    . INSERT / REPLACE / REMOVE PACEMAKER    . PACEMAKER INSERTION  10-01-2012  . PARATHYROIDECTOMY  10/28/2012  . PAROTIDECTOMY  10/28/2012   Procedure: PAROTIDECTOMY;  Surgeon: Ascencion Dike, MD;  Location: [place];  Service: ENT;  Laterality: Right;  Total Parotidectomy  . PERMANENT PACEMAKER INSERTION N/A 10/01/2012   Procedure: PERMANENT PACEMAKER INSERTION;  Surgeon: Evans Lance, MD;  Location: [facility] CATH LAB;  Service: Cardiovascular;  Laterality: N/A;    Prior to Admission medications   Medication Sig Start Date End Date Taking? Authorizing Provider  ARIPiprazole (ABILIFY) 20 MG tablet Take 20 mg by mouth daily.   Yes [provider]  aspirin EC 81 MG tablet Take 81 mg by mouth daily.   Yes [provider]  atorvastatin (LIPITOR) 40 MG tablet Take 40 mg by mouth every morning.    Yes [provider]  levothyroxine (SYNTHROID) 50 MCG tablet Take 1 tablet (50 mcg total) by mouth daily before breakfast. 12/26/15  Yes Penland, Kelby Fam, MD  losartan (COZAAR) 50 MG tablet Take 50 mg by mouth every morning.    Yes [provider]  metoprolol tartrate (LOPRESSOR) 25 MG tablet TAKE 1 TABLET TWICE A DAY 12/02/13  Yes Evans Lance, MD  naproxen sodium (ALEVE) 220 MG tablet  Take 440 mg by mouth every 12 (twelve) hours as needed. For headaches   Yes [provider]  potassium citrate (UROCIT-K) 10 MEQ (1080 MG) SR tablet Take 10 mEq by mouth 3 (three) times daily with meals.   Yes [provider]  rivaroxaban (XARELTO) 20 MG TABS tablet Take 1 tablet (20 mg total) by mouth daily. 01/06/16  Yes Almyra Deforest, PA    Current Facility-Administered Medications  Medication Dose Route Frequency Provider Last Rate Last Dose  . 0.9 %  sodium chloride infusion   Intravenous Once Nat Christen, MD       Current Outpatient Medications  Medication Sig Dispense Refill  . ARIPiprazole (ABILIFY) 20 MG tablet Take 20 mg by mouth daily.    Marland Kitchen aspirin EC 81 MG tablet Take 81 mg by mouth daily.    Marland Kitchen atorvastatin (LIPITOR) 40 MG tablet Take 40 mg by mouth every morning.     Marland Kitchen levothyroxine (SYNTHROID) 50 MCG tablet Take 1 tablet (50 mcg total) by mouth daily before breakfast. 30 tablet 3  . losartan (COZAAR) 50 MG tablet Take 50 mg by mouth every morning.     . metoprolol tartrate (LOPRESSOR) 25 MG tablet TAKE 1 TABLET TWICE A DAY 180 tablet 0  . naproxen sodium (ALEVE) 220 MG tablet Take 440 mg by mouth every 12 (twelve) hours as needed. For headaches    . potassium citrate (UROCIT-K) 10 MEQ (1080 MG) SR tablet Take 10 mEq by mouth 3 (three) times daily with meals.    . rivaroxaban (XARELTO) 20 MG TABS tablet Take 1 tablet (20 mg total) by mouth daily. 90 tablet 3    Allergies as of 03/27/2018  . (No Known Allergies)    Family History  Problem Relation Age of Onset  . Heart disease Sister   . Heart attack Sister   . Cirrhosis Mother   . Cancer Brother        brain cancer  . Cancer Sister        stomach cancer    Social History   Socioeconomic History  . Marital status: Widowed    Spouse name: Not on file  . Number of children: Not on file  . Years of education: Not on file  . Highest education level: Not on file  Occupational History  . Not on file   Social Needs  . Financial resource strain: Not on file  . Food insecurity:    Worry: Not on file    Inability: Not on file  . Transportation needs:    Medical: Not on file    Non-medical: Not on file  Tobacco Use  . Smoking status: Former Smoker    Types: Cigarettes    Last attempt to quit: 11/25/1972    Years since quitting: 45.3  . Smokeless tobacco: Never Used  Substance and Sexual Activity  . Alcohol use: Yes    Alcohol/week: 0.6 oz    Types: 1  Cans of beer per week    Comment: daily  . Drug use: No  . Sexual activity: Never  Lifestyle  . Physical activity:    Days per week: Not on file    Minutes per session: Not on file  . Stress: Not on file  Relationships  . Social connections:    Talks on phone: Not on file    Gets together: Not on file    Attends religious service: Not on file    Active member of club or organization: Not on file    Attends meetings of clubs or organizations: Not on file    Relationship status: Not on file  . Intimate partner violence:    Fear of current or ex partner: Not on file    Emotionally abused: Not on file    Physically abused: Not on file    Forced sexual activity: Not on file  Other Topics Concern  . Not on file  Social History Narrative  . Not on file    Review of Systems: General: Negative for anorexia, weight loss, fever, chills. ENT: Negative for hoarseness. Admits solid food dysphagia. CV: Negative for chest pain, angina, palpitations, dyspnea on exertion, peripheral edema.  Respiratory: Negative for dyspnea at rest, dyspnea on exertion, cough, sputum, wheezing.  GI: See history of present illness. MS: Negative for joint pain, low back pain.  Derm: Negative for rash or itching.  Endo: Negative for unusual weight change.  Heme: Negative for bruising or bleeding. Allergy: Negative for rash or hives.  Physical Exam: Vital signs in last 24 hours: Temp:  [97.4 F (36.3 C)-97.5 F (36.4 C)] 97.4 F (36.3 C) (05/03  1333) Pulse Rate:  [56-65] 60 (05/03 1415) Resp:  [18-20] 18 (05/03 1333) BP: (106-145)/(40-94) 144/94 (05/03 1415) SpO2:  [96 %-100 %] 100 % (05/03 1415) Weight:  [143 lb (64.9 kg)] 143 lb (64.9 kg) (05/03 1123)   General:   Alert,  Well-developed, well-nourished, pleasant and cooperative in NAD Head:  Normocephalic and atraumatic. Eyes:  Sclera clear, no icterus. Conjunctiva pink. Ears:  Hard of hearing. Neck:  Supple; no masses or thyromegaly. Lungs:  Clear throughout to auscultation. No wheezes, crackles, or rhonchi. No acute distress. Heart:  Regular rate and rhythm; no murmurs, clicks, rubs,  or gallops. Abdomen:  Soft, nontender and nondistended. No masses, hepatosplenomegaly or hernias noted. Normal bowel sounds, without guarding, and without rebound.   Rectal:  Deferred.   Msk:  Symmetrical without gross deformities. Pulses:  Normal bilateral DP pulses noted. Extremities:  Without clubbing or edema. Neurologic:  Alert and oriented x4;  grossly normal neurologically. Skin:  Intact without significant lesions or rashes. Psych:  Alert and cooperative. Normal mood and affect.  Intake/Output from previous day: No intake/output data recorded. Intake/Output this shift: Total I/O In: 1000 [IV Piggyback:1000] Out: -   Lab Results: Recent Labs    03/27/18 1041  WBC 5.1  HGB 6.4*  HCT 21.2*  PLT 255   BMET Recent Labs    03/27/18 1041 03/27/18 1217  NA 135 134*  K 4.3 4.2  CL 106 104  CO2 18* 17*  GLUCOSE 115* 112*  BUN 38* 38*  CREATININE 1.48* 1.49*  CALCIUM 8.9 8.8*   LFT Recent Labs    03/27/18 1217  PROT 7.1  ALBUMIN 3.5  AST 26  ALT 16*  ALKPHOS 67  BILITOT 1.0   PT/INR No results for input(s): LABPROT, INR in the last 72 hours. Hepatitis Panel No results for  input(s): HEPBSAG, HCVAB, HEPAIGM, HEPBIGM in the last 72 hours. C-Diff No results for input(s): CDIFFTOX in the last 72 hours.  Studies/Results: No results found.  Impression: Very  pleasant 84 year old man who began having dark/melena stools to 3 months ago.  This is been ongoing.  Began having weakness about 2 weeks ago.  It progressed to the point of difficulty ambulating without assistance.  Denies upper GI symptoms.  Does have dysphasia symptoms, however.  This is been ongoing for a bit of time now.  Also with a globus sensation.  Denies GERD.  Intermittent NSAIDs.  Heme positive in the emergency department, hemoglobin in the 6 range.  He is on chronic anticoagulation with Xarelto for atrial fibrillation.  His Xarelto is currently on hold.  Most likely upper GI bleed including esophagitis, gastritis, duodenitis, duodenal ulcer, peptic ulcer disease.  All of this in the setting of NSAIDs and chronic anticoagulation.  He was receiving blood during my visit with him.  He appears alert and active.  His last dose of Xarelto was this morning.  Plan: 1. Agree with PPI 2. Hold Xarelto for now 3. Transfuse as necessary 4. Monitor hemoglobin closely 5. Monitor for recurrent GI bleed 6. He would likely benefit from an upper endoscopy in 2 days at the earliest due to anticoagulation. 7. Supportive measures   Thank you for allowing [place] to participate in the care of Loetta Rough, DNP, AGNP-C Adult & Gerontological Nurse Practitioner [facility]    LOS: 0 days     03/27/2018, 2:28 PM

## 2018-03-27 NOTE — ED Triage Notes (Signed)
Patient c/o dark tarry stools and generalized fatigue. Per patient dark tarry stools intermittent x6 months. Patient seen by PCP yesterday after generalized weakness increased on Sunday. Patient had blood work done at office HGB 5 rechecked today 6. Patient told to come directly to ED. No hx of GI bleed in past. Per family patient takes xarelto.

## 2018-03-27 NOTE — H&P (Signed)
History and Physical    Fernando [name] WEX:[id number] DOB: May 21, 1927 DOA: 03/27/2018  PCP: Rosita Fire, MD   I have briefly reviewed patients previous medical reports in [facility].  Patient coming from: Home  Chief Complaint: Melena, weakness, fatigue and shortness of breath.  HPI: Fernando [name] is a 84 year old male with a past medical history significant for carotid artery stenosis, atrial fibrillation (with tachybradycardia syndrome, status post pacemaker implantation on chronically on Xarelto), history of coronary artery disease, hyperlipidemia, hypertension, depression and dysphagia; who presented to the emergency department secondary to weakness, increased fatigue, shortness of breath and melanotic stools.  Patient reported that his symptoms have been present on and off for the last 4 to 3-month, with drastic worsening in his capacity to perform activities in the last 3 days or so.  Patient was seen by his PCP and blood work was done demonstrating a low hemoglobin.  Patient was instructed to come to the emergency department for further evaluation.  While in the ED repeat blood work show a hemoglobin of 6.4 (last hemoglobin on record was around 14).  A fecal occult blood test was also positive, suggesting GIB. Patient denies chest pain, fever, cough, hematuria, abdominal pain, focal weakness or neurologic deficits.  ED Course: Patient received gentle fluid resuscitation and started on 2 units of PRBC transfusion.  TR[facility] was contacted to admit patient for further management treatment.  1 dose of IV PPI was given.  Review of Systems:  All other systems reviewed and apart from HPI, are negative.  Past Medical History:  Diagnosis Date  . Arthritis   . Atrial fibrillation ([place])   . Carotid artery disease ([place])    Dr. Kellie Simmering - s/p left CEA  . Coronary artery disease   . Essential hypertension, benign   . Hyperlipidemia   . 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 This mass was initially felt to be 1.7 cm at the time of presentation but had enlarged significantly to 4 cm clinically at the time of surgery. Pathologically it was 3.4 cm. A lymph node deep to the parotid was enlarged clinically at the time of surgery but was negative   . Tachycardia-bradycardia syndrome [facility])     Past Surgical History:  Procedure Laterality Date  . CAROTID ENDARTERECTOMY Left Jan. 3, 2009   CE  . CATARACT EXTRACTION W/ INTRAOCULAR LENS IMPLANT     Bilateral  . EYE SURGERY    . INSERT / REPLACE / REMOVE PACEMAKER    . PACEMAKER INSERTION  10-01-2012  . PARATHYROIDECTOMY  10/28/2012  . PAROTIDECTOMY  10/28/2012   Procedure: PAROTIDECTOMY;  Surgeon: Ascencion Dike, MD;  Location: [place];  Service: ENT;  Laterality: Right;  Total Parotidectomy  . PERMANENT PACEMAKER INSERTION N/A 10/01/2012   Procedure: PERMANENT PACEMAKER INSERTION;  Surgeon: Evans Lance, MD;  Location: [facility] CATH LAB;  Service: Cardiovascular;  Laterality: N/A;    Social History  reports that he quit smoking about 45 years ago. His smoking use included cigarettes. He has never used smokeless tobacco. He reports that he drinks about 0.6 oz of alcohol per week. He reports that he does not use drugs.  No Known Allergies  Family History  Problem Relation Age of Onset  . Heart disease Sister   . Heart attack Sister   . Cirrhosis Mother   . Cancer Brother  brain cancer  . Cancer Sister        stomach cancer     Prior to Admission medications   Medication Sig Start Date End Date Taking? Authorizing Provider  ARIPiprazole (ABILIFY) 20 MG tablet Take 20 mg by mouth daily.   Yes [provider]  aspirin EC 81 MG tablet Take 81 mg by mouth daily.   Yes [provider]  atorvastatin (LIPITOR) 40 MG tablet Take 40 mg by mouth every morning.    Yes [provider]  levothyroxine (SYNTHROID) 50 MCG tablet Take 1 tablet (50 mcg total) by mouth daily before breakfast. 12/26/15  Yes Penland, Kelby Fam, MD  losartan (COZAAR) 50 MG tablet Take 50 mg by mouth every morning.    Yes [provider]  metoprolol tartrate (LOPRESSOR) 25 MG tablet TAKE 1 TABLET TWICE A DAY 12/02/13  Yes Evans Lance, MD  naproxen sodium (ALEVE) 220 MG tablet Take 440 mg by mouth every 12 (twelve) hours as needed. For headaches   Yes [provider]  potassium citrate (UROCIT-K) 10 MEQ (1080 MG) SR tablet Take 10 mEq by mouth 3 (three) times daily with meals.   Yes [provider]  rivaroxaban (XARELTO) 20 MG TABS tablet Take 1 tablet (20 mg total) by mouth daily. 01/06/16  Yes Almyra Deforest, [place]    Physical Exam: Vitals:   03/27/18 1430 03/27/18 1445 03/27/18 1540 03/27/18 1611  BP: 138/64 (!) 140/97 (!) 129/93 (!) 142/106  Pulse: 60 (!) 59 65 (!) 59  Resp:   18 16  Temp:   98.4 F (36.9 C) (!) 97.5 F (36.4 C)  TempSrc:   Oral Oral  SpO2: 100% 100% 100% 100%  Weight:   64.7 kg (142 lb 10.2 oz)   Height:   5\' 4"  (1.626 m)     Constitutional: Feeling weak and tired, currently denying chest pain, shortness of breath, nausea, vomiting or abdominal pain.  Elderly male, chronically ill in appearance, on the weight. Eyes: PERTLA, lids and conjunctivae normal, no icterus, no nystagmus. ENMT: Very hard of hearing.  Mucous membranes were dry on exam.  Posterior pharynx clear of any exudate or lesions. No thrush.    Neck: supple, no masses, no thyromegaly, no JVD Respiratory: clear to auscultation bilaterally, no wheezing, no crackles. Normal respiratory effort. No accessory muscle use.  Cardiovascular: S1 & S2 heard, rate controlled, some PVCs appreciated on exam.  Positive soft systolic ejection murmur; no rubs, no gallops. No extremity edema.  Abdomen: No distension, no tenderness, no masses palpated. No hepatosplenomegaly. Bowel sounds normal.    Musculoskeletal: no clubbing / cyanosis. No joint deformity upper and lower extremities. Good ROM, no contractures. Normal muscle tone.  Skin: no rashes, lesions, ulcers. No induration Neurologic: CN grossly intact. Sensation intact, DTR normal. Strength 4/5 in all 4 limbs due to poor effort.Marland Kitchen  Psychiatric: Normal judgment and insight. Alert and oriented x 3. Normal mood.     Labs on Admission: I have personally reviewed following labs and imaging studies  CBC: Recent Labs  Lab 03/27/18 1041  WBC 5.1  NEUTROABS 3.3  HGB 6.4*  HCT 21.2*  MCV 71.1*  PLT 474   Basic Metabolic Panel: Recent Labs  Lab 03/27/18 1041 03/27/18 1217  NA 135 134*  K 4.3 4.2  CL 106 104  CO2 18* 17*  GLUCOSE 115* 112*  BUN 38* 38*  CREATININE 1.48* 1.49*  CALCIUM 8.9 8.8*   Liver Function Tests: Recent Labs  Lab 03/27/18 1217  AST 26  ALT 16*  ALKPHOS 67  BILITOT 1.0  PROT 7.1  ALBUMIN 3.5   Urine analysis:    Component Value Date/Time   COLORURINE YELLOW 12/21/2007 1618   APPEARANCEUR CLEAR 12/21/2007 1618   LABSPEC 1.023 12/21/2007 1618   PHURINE 5.0 12/21/2007 1618   GLUCOSEU NEGATIVE 12/21/2007 1618   HGBUR NEGATIVE 12/21/2007 1618   BILIRUBINUR NEGATIVE 12/21/2007 1618   KETONESUR 15 (A) 12/21/2007 1618   PROTEINUR NEGATIVE 12/21/2007 1618   UROBILINOGEN 0.2 12/21/2007 1618   NITRITE NEGATIVE 12/21/2007 1618   LEUKOCYTESUR  12/21/2007 1618    NEGATIVE MICROSCOPIC NOT DONE ON URINES WITH NEGATIVE PROTEIN, BLOOD, LEUKOCYTES, NITRITE, OR GLUCOSE <1000 mg/dL.     Radiological Exams on Admission: No results found.  EKG: None.  Assessment/Plan 1-GI bleed: Most likely upper GI in nature secondary to melanotic stools. Probably gastritis, esophagitis or PUD. -Patient with symptomatic anemia and will be admitted to the hospital for fluid resuscitation, blood transfusion and further work-up most likely including endoscopy. -Stop NSAIDs and Xarelto -Start IV Protonix twice a  day -Transfused 2 units of PRBCs and follow hemoglobin response -2 large bore IVs has been placed -GI service has been consulted -Patient is hemodynamically stable currently.  2-atrial fibrillation ([place]) -Patient is status post pacemaker due to tachybradycardia syndrome with underlying atrial fibrillation -Rate is controlled currently -Holding Xarelto due to acute GI bleed -Continue metoprolol  3-Essential hypertension, benign -stable -will continue metoprolol only for now in the setting of acute bleeding   4-Esophageal dysphagia -using PPI -SPL consulted -currently on full liquid diet -GI also on board and looking to perform EGD.  5-Symptomatic anemia -due to GI bleed -las Hgb on records 14; now 6.4 -would transfuse 2 units of PRBC's -follow Hgb trend and further transfuse as needed  -stopping NSAID's and anticoagulation   6-Weakness -In the setting of acute symptomatic anemia -Will stabilize hemoglobin -As for physical therapy evaluation once underlying condition under control.  7-CAD (coronary artery disease), native coronary artery -No chest pain. -Holding aspirin due to acute GI bleed -Continue beta-blocker and statins.   8-HLD (hyperlipidemia) -Continue statins  9-Depression -No suicidal ideation or hallucinations. -Continue Abilify.  10-acute on chronic renal failure  -patient with chronic stage 3 at baseline  -most likely due to poor perfusion with acute blood loss anemia and pre-renal azotemia (as family expressed, patient has not been eating or drinking much lately. -stopping nephrotoxic agents -IVF's and blood transfusion provided  -follow renal function trend  -patient denying dysuria   Time: 70 minutes   DVT prophylaxis: SCDs Code Status: DNR Family Communication: Son and daughter at bedside. Disposition Plan: Anticipate discharge back home once bleeding source identified and patient symptomatic anemia corrected. Consults called:  Gastroenterology service Admission status: Inpatient, MedSurg, LOS more than 2 midnights.   Barton Dubois MD [facility] Pager [phone]  If 7PM-7AM, please contact night-coverage [email or web address] Password [facility]  03/27/2018, 5:48 PM

## 2018-03-27 NOTE — ED Provider Notes (Signed)
[facility] EMERGENCY DEPARTMENT Provider Note   CSN: [id number] Arrival date & time: 03/27/18  1111     History   Chief Complaint Chief Complaint  Patient presents with  . Rectal Bleeding    HPI Fernando [name] is a 84 y.o. male.  Level 5 caveat for urgent need for intervention.  Patient complains of generalized fatigue and dark tarry stools intermittently for 6 months.  Family reports a 15 pound weight loss in a relatively short period time.  He was seen by his primary care doctor today and his hemoglobin was in the 6 range (hemoglobin 14.0 on 12/26/2016).  He was instructed to come to the emergency department.  He takes Xarelto for history of atrial fibrillation and coronary artery disease.     Past Medical History:  Diagnosis Date  . Arthritis   . Atrial fibrillation ([place])   . Carotid artery disease ([place])    Dr. Kellie Simmering - s/p left CEA  . Coronary artery disease   . Essential hypertension, benign   . Hyperlipidemia   . 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 This mass was initially felt to be 1.7 cm at the time of presentation but had enlarged significantly to 4 cm clinically at the time of surgery. Pathologically it was 3.4 cm. A lymph node deep to the parotid was enlarged clinically at the time of surgery but was negative   . Tachycardia-bradycardia syndrome [facility])     Patient Active Problem List   Diagnosis Date Noted  . GI bleed 03/27/2018  . Carotid stenosis 09/27/2014  . Aftercare following surgery of the circulatory system 09/27/2014  . 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 Pacemaker-St.[employer]ude 10/02/2012  . Atrial fibrillation ([place]) 09/09/2012  . Essential hypertension, benign 09/09/2012  . Tachy-brady syndrome ([place]) 09/09/2012  . Occlusion and stenosis of carotid artery without mention of cerebral infarction  03/19/2012    Past Surgical History:  Procedure Laterality Date  . CAROTID ENDARTERECTOMY Left Jan. 3, 2009   CE  . CATARACT EXTRACTION W/ INTRAOCULAR LENS IMPLANT     Bilateral  . EYE SURGERY    . INSERT / REPLACE / REMOVE PACEMAKER    . PACEMAKER INSERTION  10-01-2012  . PARATHYROIDECTOMY  10/28/2012  . PAROTIDECTOMY  10/28/2012   Procedure: PAROTIDECTOMY;  Surgeon: Ascencion Dike, MD;  Location: [facility];  Service: ENT;  Laterality: Right;  Total Parotidectomy  . PERMANENT PACEMAKER INSERTION N/A 10/01/2012   Procedure: PERMANENT PACEMAKER INSERTION;  Surgeon: Evans Lance, MD;  Location: [facility] C[facility] LAB;  Service: Cardiovascular;  Laterality: N/A;        Home Medications    Prior to Admission medications   Medication Sig Start Date End Date Taking? Authorizing Provider  ARIPiprazole (ABILIFY) 20 MG tablet Take 20 mg by mouth daily.   Yes [provider]  aspirin EC 81 MG tablet Take 81 mg by mouth daily.   Yes [provider]  atorvastatin (LIPITOR) 40 MG tablet Take 40 mg by mouth every morning.    Yes [provider]  levothyroxine (SYNTHROID) 50 MCG tablet Take 1 tablet (50 mcg total) by mouth daily before breakfast. 12/26/15  Yes Penland, Kelby Fam, MD  losartan (COZAAR) 50 MG tablet Take 50 mg by mouth every morning.    Yes [provider]  metoprolol tartrate (LOPRESSOR) 25 MG tablet TAKE 1 TABLET TWICE  A DAY 12/02/13  Yes Evans Lance, MD  naproxen sodium (ALEVE) 220 MG tablet Take 440 mg by mouth every 12 (twelve) hours as needed. For headaches   Yes [provider]  potassium citrate (UROCIT-K) 10 MEQ (1080 MG) SR tablet Take 10 mEq by mouth 3 (three) times daily with meals.   Yes [provider]  rivaroxaban (XARELTO) 20 MG TABS tablet Take 1 tablet (20 mg total) by mouth daily. 01/06/16  Yes Almyra Deforest, PA    Family History Family History  Problem Relation Age of Onset  . Heart disease Sister   . Heart attack Sister   .  Cirrhosis Mother   . Cancer Brother        brain cancer  . Cancer Sister        stomach cancer    Social History Social History   Tobacco Use  . Smoking status: Former Smoker    Types: Cigarettes    Last attempt to quit: 11/25/1972    Years since quitting: 45.3  . Smokeless tobacco: Never Used  Substance Use Topics  . Alcohol use: Yes    Alcohol/week: 0.6 oz    Types: 1 Cans of beer per week    Comment: daily  . Drug use: No     Allergies   Patient has no known allergies.   Review of Systems Review of Systems  Unable to perform ROS: Acuity of condition     Physical Exam Updated Vital Signs BP (!) 144/94   Pulse 60   Temp (!) 97.4 F (36.3 C) (Oral)   Resp 18   Ht 5\' 4"  (1.626 m)   Wt 64.9 kg (143 lb)   SpO2 100%   BMI 24.55 kg/m   Physical Exam  Constitutional: He is oriented to person, place, and time. He appears well-developed and well-nourished.  Pleasant, pale  HENT:  Head: Normocephalic and atraumatic.  Eyes: Conjunctivae are normal.  Neck: Neck supple.  Cardiovascular: Normal rate and regular rhythm.  Pulmonary/Chest: Effort normal and breath sounds normal.  Abdominal: Soft. Bowel sounds are normal.  Genitourinary:  Genitourinary Comments: Rectal exam: Light brown stool, no obvious tarry stool, heme positive  Musculoskeletal: Normal range of motion.  Neurological: He is alert and oriented to person, place, and time.  Skin: Skin is warm and dry.  Psychiatric: He has a normal mood and affect. His behavior is normal.  Nursing note and vitals reviewed.    ED Treatments / Results  Labs (all labs ordered are listed, but only abnormal results are displayed) Labs Reviewed  COMPREHENSIVE METABOLIC PANEL - Abnormal; Notable for the following components:      Result Value   Sodium 134 (*)    CO2 17 (*)    Glucose, Bld 112 (*)    BUN 38 (*)    Creatinine, Ser 1.49 (*)    Calcium 8.8 (*)    ALT 16 (*)    GFR calc non Af Amer 40 (*)    GFR calc  Af Amer 46 (*)    All other components within normal limits  POC OCCULT BLOOD, ED - Abnormal; Notable for the following components:   Fecal Occult Bld POSITIVE (*)    All other components within normal limits  CBC WITH DIFFERENTIAL/PLATELET  TYPE AND SCREEN  ABO/RH  PREPARE RBC (CROSSMATCH)    EKG None  Radiology No results found.  Procedures Procedures (including critical care time)  Medications Ordered in ED Medications  0.9 %  sodium chloride infusion (has no administration in time range)  pantoprazole (PROTONIX) injection 40 mg (has no administration in time range)  sodium chloride 0.9 % bolus 1,000 mL (0 mLs Intravenous Stopped 03/27/18 1314)     Initial Impression / Assessment and Plan / ED Course  I have reviewed the triage vital signs and the nursing notes.  Pertinent labs & imaging results that were available during my care of the patient were reviewed by me and considered in my medical decision making (see chart for details).     Patient presents with fatigue, weight loss, tarry stool, anemia.  Most likely etiology is a upper GI bleed.  Will initiate IV Protonix, blood transfusion, admit to general medicine.   CRITICAL CARE Performed by: Nat Christen Total critical care time: 30 minutes Critical care time was exclusive of separately billable procedures and treating other patients. Critical care was necessary to treat or prevent imminent or life-threatening deterioration. Critical care was time spent personally by me on the following activities: development of treatment plan with patient and/or surrogate as well as nursing, discussions with consultants, evaluation of patient's response to treatment, examination of patient, obtaining history from patient or surrogate, ordering and performing treatments and interventions, ordering and review of laboratory studies, ordering and review of radiographic studies, pulse oximetry and re-evaluation of patient's  condition.  Final Clinical Impressions(s) / ED Diagnoses   Final diagnoses:  Gastrointestinal hemorrhage, unspecified gastrointestinal hemorrhage type    ED Discharge Orders    None       Nat Christen, MD 03/27/18 [phone]

## 2018-03-28 LAB — CBC WITH DIFFERENTIAL/PLATELET
BASOS ABS: 0.1 10*3/uL (ref 0.0–0.1)
BASOS PCT: 2 %
EOS PCT: 7 %
Eosinophils Absolute: 0.3 10*3/uL (ref 0.0–0.7)
HEMATOCRIT: 26.9 % — AB (ref 39.0–52.0)
Hemoglobin: 8.8 g/dL — ABNORMAL LOW (ref 13.0–17.0)
Lymphocytes Relative: 8 %
Lymphs Abs: 0.4 10*3/uL — ABNORMAL LOW (ref 0.7–4.0)
MCH: 24.4 pg — ABNORMAL LOW (ref 26.0–34.0)
MCHC: 32.7 g/dL (ref 30.0–36.0)
MCV: 74.5 fL — AB (ref 78.0–100.0)
MONO ABS: 1 10*3/uL (ref 0.1–1.0)
Monocytes Relative: 20 %
Neutro Abs: 3.2 10*3/uL (ref 1.7–7.7)
Neutrophils Relative %: 63 %
Platelets: 237 10*3/uL (ref 150–400)
RBC: 3.61 MIL/uL — ABNORMAL LOW (ref 4.22–5.81)
RDW: 19.2 % — AB (ref 11.5–15.5)
WBC: 5 10*3/uL (ref 4.0–10.5)

## 2018-03-28 LAB — BASIC METABOLIC PANEL
ANION GAP: 11 (ref 5–15)
BUN: 38 mg/dL — ABNORMAL HIGH (ref 6–20)
CHLORIDE: 107 mmol/L (ref 101–111)
CO2: 21 mmol/L — AB (ref 22–32)
Calcium: 8.7 mg/dL — ABNORMAL LOW (ref 8.9–10.3)
Creatinine, Ser: 1.46 mg/dL — ABNORMAL HIGH (ref 0.61–1.24)
GFR calc Af Amer: 48 mL/min — ABNORMAL LOW (ref >60)
GFR calc non Af Amer: 41 mL/min — ABNORMAL LOW (ref >60)
Glucose, Bld: 100 mg/dL — ABNORMAL HIGH (ref 65–99)
POTASSIUM: 4.7 mmol/L (ref 3.5–5.1)
Sodium: 139 mmol/L (ref 135–145)

## 2018-03-28 LAB — BPAM RBC
Blood Product Expiration Date: 201906012359
Blood Product Expiration Date: 201906012359
ISSUE DATE / TIME: 201905031812
ISSUE DATE / TIME: 201905031312
UNIT TYPE AND RH: 6200
UNIT TYPE AND RH: 6200

## 2018-03-28 LAB — TYPE AND SCREEN
ABO/RH(D): A POS
ANTIBODY SCREEN: NEGATIVE
UNIT DIVISION: 0
Unit division: 0

## 2018-03-28 MED ORDER — ENSURE ENLIVE PO LIQD
237.0000 mL | Freq: Three times a day (TID) | ORAL | Status: DC
  Administered 2018-03-28 – 2018-03-29 (×4): 237 mL via ORAL

## 2018-03-28 MED ORDER — PANTOPRAZOLE SODIUM 40 MG PO TBEC
40.0000 mg | DELAYED_RELEASE_TABLET | Freq: Two times a day (BID) | ORAL | Status: DC
  Administered 2018-03-28 – 2018-03-29 (×3): 40 mg via ORAL
  Filled 2018-03-28 (×3): qty 1

## 2018-03-28 MED ORDER — SODIUM CHLORIDE 0.9 % IV SOLN
INTRAVENOUS | Status: DC
  Administered 2018-03-29: via INTRAVENOUS

## 2018-03-28 NOTE — Progress Notes (Signed)
[facility] PROGRESS NOTE  Fernando [name] ZOX:[id number] DOB: 1927/02/21 DOA: 03/27/2018 PCP: Rosita Fire, MD  Interim summary and HPI  84 year old male with a past medical history significant for carotid artery stenosis, atrial fibrillation (with tachybradycardia syndrome, status post pacemaker implantation on chronically on Xarelto), history of coronary artery disease, hyperlipidemia, hypertension, depression and dysphagia; who presented to the emergency department secondary to weakness, increased fatigue, shortness of breath and melanotic stools.  Patient reported that his symptoms have been present on and off for the last 4 to 26-month, with drastic worsening in his capacity to perform activities in the last 3 days or so.  Patient was seen by his PCP and blood work was done demonstrating a low hemoglobin.  Patient was instructed to come to the emergency department for further evaluation.  While in the ED repeat blood work show a hemoglobin of 6.4 (last hemoglobin on record was around 14).  A fecal occult blood test was also positive, suggesting GIB. Patient denies chest pain, fever, cough, hematuria, abdominal pain, focal weakness or neurologic deficits.  Assessment/Plan: 1-GI bleed: Most likely upper GI in nature secondary to melanotic stools. Probably gastritis, esophagitis or PUD. -Patient with symptomatic anemia on admission. -continue holding xarelto and avoid NSAID's  -continue PPI Q12 hours.  -Transfused 2 units of PRBCs and follow hemoglobin response -Hgb improved after 2 units of PRBC's  -will follow GI recommendations  -Patient has remained hemodynamically stable.   2-atrial fibrillation ([place]) -Patient is status post pacemaker due to tachybradycardia syndrome with underlying atrial fibrillation. -rate has remained controlled -continue metoprolol -continue holding xarelto   3-Essential hypertension, benign -BP fairly well controlled -continue current antihypertensive  regimen   4-Esophageal dysphagia -continue PPI  -speech therapy consulted; will follow any recommendations for changes in the consistency of his diet.   5-Symptomatic anemia -Secondary to GI bleed -Hemoglobin improved after 2 units of PRBCs and currently 8.8  -IV iron to be pursuit as well -continue PPI -avoid NSAID's -continue holding xarelto  6-Weakness -Patient feeling better after transfusion -Once condition stabilized will ask physical therapy for evaluation.  7-CAD (coronary artery disease), native coronary artery -Has remained chest pain-free and reports improvement in his breathing. -Continue holding aspirin due to acute GI bleed -Will continue beta-blocker and statins.    8-HLD (hyperlipidemia) -Continue statins.  9-Depression -No suicidal ideation or hallucinations -Continue Abilify.   10-acute on chronic renal failure  -patient with chronic stage 3 at baseline  -most likely due to poor perfusion with acute blood loss anemia and pre-renal azotemia (as family expressed, patient has not been eating or drinking much lately) -Continue holding nephrotoxic agents -Gentle IV fluids and blood transfusion given on 03/27/2018 -Continue to follow creatinine trend -Soon improvement seen on repeat metabolic panel today. -Patient denies dysuria and expressed increased urine output..  Code Status: DNR Family Communication: No family at bedside during my examination. Disposition Plan: Remains inpatient, request pharmacy assistance for IV iron, follow hemoglobin trend, continue holding anticoagulations.  GI looking to perform endoscopy on 5/5.  Continue Protonix.   Consultants:  GI service  Procedures:  Planned endoscopy on 03/29/2018.  Antibiotics:  None  HPI/Subjective: Afebrile, feeling better, denying chest pain, shortness of breath and palpitations.  Patient also expressed no seen any black stools today.  Objective: Vitals:   03/27/18 2130 03/28/18 0500   BP: 133/78 (!) 159/97  Pulse: 64 60  Resp: 18 20  Temp: 98.2 F (36.8 C) 97.7 F (36.5 C)  SpO2: 98% 95%  Intake/Output Summary (Last 24 hours) at 03/28/2018 1540 Last data filed at 03/28/2018 1400 Gross per 24 hour  Intake 1515.84 ml  Output 500 ml  Net 1015.84 ml   Filed Weights   03/27/18 1123 03/27/18 1540  Weight: 64.9 kg (143 lb) 64.7 kg (142 lb 10.2 oz)    Exam:   General: Feeling better, no chest pain, no palpitations, no abdominal pain, no nausea or vomiting.  Patient reports no black stools today.  Cardiovascular: Rate controlled, no JVD, no rubs, no gallops, positive systolic ejection murmur.  Respiratory: Good air movement bilaterally, no wheezing, no crackles, no using accessory muscles.  Abdomen: Soft, nontender, nondistended, positive bowel sounds  Musculoskeletal: No edema, no cyanosis, no clubbing.  Data Reviewed: Basic Metabolic Panel: Recent Labs  Lab 03/27/18 1041 03/27/18 1214 03/27/18 1217 03/28/18 0541  NA 135  --  134* 139  K 4.3  --  4.2 4.7  CL 106  --  104 107  CO2 18*  --  17* 21*  GLUCOSE 115*  --  112* 100*  BUN 38*  --  38* 38*  CREATININE 1.48*  --  1.49* 1.46*  CALCIUM 8.9  --  8.8* 8.7*  MG  --  2.0  --   --   PHOS  --  4.4  --   --    Liver Function Tests: Recent Labs  Lab 03/27/18 1217  AST 26  ALT 16*  ALKPHOS 67  BILITOT 1.0  PROT 7.1  ALBUMIN 3.5   CBC: Recent Labs  Lab 03/27/18 1041 03/28/18 0541  WBC 5.1 5.0  NEUTROABS 3.3 3.2  HGB 6.4* 8.8*  HCT 21.2* 26.9*  MCV 71.1* 74.5*  PLT 255 237   Studies: No results found.  Scheduled Meds: . ARIPiprazole  20 mg Oral Daily  . atorvastatin  40 mg Oral q1800  . feeding supplement (ENSURE ENLIVE)  237 mL Oral TID WC & HS  . levothyroxine  50 mcg Oral QAC breakfast  . metoprolol tartrate  25 mg Oral BID  . pantoprazole  40 mg Oral BID AC  . sodium chloride flush  3 mL Intravenous Q12H   Continuous Infusions: . sodium chloride      Principal  Problem:   GI bleed Active Problems:   Atrial fibrillation ([facility])   Essential hypertension, benign   Esophageal dysphagia   Symptomatic anemia   Weakness   CAD (coronary artery disease), native coronary artery   Acute blood loss anemia   HLD (hyperlipidemia)   Depression    Time spent: 30 minutes    [facility] Pager [phone]. If 7PM-7AM, please contact night-coverage at [email or web address], password [facility] 03/28/2018, 3:40 PM  LOS: 1 day

## 2018-03-28 NOTE — H&P (View-Only) (Signed)
Patient ID: Fernando [name], male   DOB: 1927/11/05, 84 y.o.   MRN: [id number]   Assessment/Plan: ADMITTED WITH MELENA AND ANEMIA FOR > 1 MO. NO BRBPR OR MELENA. TOLERATING POs.  PLAN: 1. EGD MAY 5 2. NPO AFTER MN EXCEPT SIPS WITH MEDS 3. PROTONIX PO BID. PO AS EFFECTIVE AS IV. 4. HOLD IRON STUDIES. WILL REFLECT TRANSFUSED BLOOD AND NOT REPRESENT PT BASELINE STATUS.   Subjective: Since I last evaluated the patient HE HAS NO questions or concerns. Wants to confirm he has his EGD May 5.  Objective: Vital signs in last 24 hours: Vitals:   03/27/18 2130 03/28/18 0500  BP: 133/78 (!) 159/97  Pulse: 64 60  Resp: 18 20  Temp: 98.2 F (36.8 C) 97.7 F (36.5 C)  SpO2: 98% 95%   General appearance: alert, cooperative and no distress Resp: clear to auscultation bilaterally Cardio: regular rate and IRREGULAR rhythm GI: soft, non-tender; bowel sounds normal;   Lab Results:   MAY 3  MAY 4 Hb  6.4  8.8 Cr     1.46   Studies/Results: No results found.  Medications: I have reviewed the patient's current medications.

## 2018-03-28 NOTE — Progress Notes (Signed)
Initial Nutrition Assessment  DOCUMENTATION CODES:  Not applicable  INTERVENTION:  Continue Ensure Enlive BID, each supplement provides 350 kcal and 20 grams of protein   Monitor for diet advancement and SLP reccomendations  NUTRITION DIAGNOSIS:  Swallowing difficulty(Chronic) related to cancer and cancer related treatments(History of parotid cancer) as evidenced by per patient/family report.  GOAL:  Patient will meet greater than or equal to 90% of their needs  MONITOR:  PO intake, Supplement acceptance, Diet advancement, Labs, Weight trends, I & O's  REASON FOR ASSESSMENT:  Malnutrition Screening Tool    ASSESSMENT:  84 y/o male PMHx Afib s/p pacemaker, CAD, HLD, HTN, Depression, dysphagia. Has had weakness, fatigue, SOB, melanotic stools on and off for last 4-6 months. Reports loss of 15 lbs recently. Presented from PCP lab work revealed low Hgb (6.4). Admitted for work up of GIB   RD operating remotely on weekends. Patient chart reviewed due to +MST, with patient reporting unintentional weight loss.   Per chart, he only has gonee from 146 lbs in March, to 142.6 lbs now. He does show a more significant loss long term as he had been 150-155 quite consisently from 2014 up until this past March  Per admission notes, there is no mention of patient having decreased PO intake.  GI note states patient has had some dysphagia symptoms/globus sensation, though it is noted this more of chronic issue (likely related to Hx of H&N cancer) as opposed to acute issue.   Patient to be on liquid diet up until he is able to undergo endoscopy in a couple days. Would continue Ensure while patient on restrictive diet.   Physical Exam: Unable to assess  Labs: H/H:8.8/26.9, Glu: 100-115, h/h:8.8/26.9 Meds: Ensure Enlive, PPI  Recent Labs  Lab 03/27/18 1041 03/27/18 1214 03/27/18 1217 03/28/18 0541  NA 135  --  134* 139  K 4.3  --  4.2 4.7  CL 106  --  104 107  CO2 18*  --  17* 21*  BUN  38*  --  38* 38*  CREATININE 1.48*  --  1.49* 1.46*  CALCIUM 8.9  --  8.8* 8.7*  MG  --  2.0  --   --   PHOS  --  4.4  --   --   GLUCOSE 115*  --  112* 100*   NUTRITION - FOCUSED PHYSICAL EXAM: Unable to assess  Diet Order:   Diet Order           Diet full liquid Room service appropriate? Yes; Fluid consistency: Thin  Diet effective now         EDUCATION NEEDS:  No education needs have been identified at this time  Skin:     Last BM:  5/2  Height:  Ht Readings from Last 1 Encounters:  03/27/18 5\' 4"  (1.626 m)   Weight:  Wt Readings from Last 1 Encounters:  03/27/18 142 lb 10.2 oz (64.7 kg)   Wt Readings from Last 10 Encounters:  03/27/18 142 lb 10.2 oz (64.7 kg)  02/17/18 146 lb (66.2 kg)  01/16/17 153 lb (69.4 kg)  12/26/16 152 lb 11.2 oz (69.3 kg)  11/26/16 155 lb 11.2 oz (70.6 kg)  06/24/16 153 lb 8 oz (69.6 kg)  12/26/15 149 lb (67.6 kg)  12/14/15 150 lb (68 kg)  10/10/15 150 lb (68 kg)  06/06/15 151 lb 6.4 oz (68.7 kg)   Ideal Body Weight:  59.1 kg  BMI:  Body mass index is 24.48 kg/m.  Estimated  Nutritional Needs:  Kcal:  1600-1800 kcals (25-28 kcal/kg bw)  Protein:  78-91g Pro (1.2-1.4g/kg bw) Fluid:  >1.6 L fluid (25 ml/kg bw)  Burtis Junes RD, LDN, CNSC Clinical Nutrition Available Tues-Sat via Pager: [phone] 03/28/2018 8:36 AM

## 2018-03-28 NOTE — Progress Notes (Signed)
MEDICATION RELATED CONSULT NOTE - INITIAL   Pharmacy Consult for IV iron Indication: iron deficient anemia  No Known Allergies  Patient Measurements: Height: 5\' 4"  (162.6 cm) Weight: 142 lb 10.2 oz (64.7 kg) IBW/kg (Calculated) : 59.2 Adjusted Body Weight: 61.7kg  Vital Signs: Temp: 97.7 F (36.5 C) (05/04 0500) Temp Source: Oral (05/04 0500) BP: 159/97 (05/04 0500) Pulse Rate: 60 (05/04 0500) Intake/Output from previous day: 05/03 0701 - 05/04 0700 In: 2035.8 [I.V.:250; Blood:785.8; IV Piggyback:1000] Out: 500 [Urine:500] Intake/Output from this shift: Total I/O In: 480 [P.O.:480] Out: -   Labs: Recent Labs    03/27/18 1041 03/27/18 1214 03/27/18 1217 03/28/18 0541  WBC 5.1  --   --  5.0  HGB 6.4*  --   --  8.8*  HCT 21.2*  --   --  26.9*  PLT 255  --   --  237  CREATININE 1.48*  --  1.49* 1.46*  MG  --  2.0  --   --   PHOS  --  4.4  --   --   ALBUMIN  --   --  3.5  --   PROT  --   --  7.1  --   AST  --   --  26  --   ALT  --   --  16*  --   ALKPHOS  --   --  67  --   BILITOT  --   --  1.0  --    Estimated Creatinine Clearance: 29.3 mL/min (A) (by C-G formula based on SCr of 1.46 mg/dL (H)).   Microbiology: No results found for this or any previous visit (from the past 720 hour(s)).  Medical History: Past Medical History:  Diagnosis Date  . Arthritis   . Atrial fibrillation ([place])   . Carotid artery disease ([place])    Dr. Kellie Simmering - s/p left CEA  . Coronary artery disease   . Essential hypertension, benign   . Hyperlipidemia   . 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 This mass was initially felt to be 1.7 cm at the time of presentation but had enlarged significantly to 4 cm clinically at the time of surgery. Pathologically it was 3.4 cm. A lymph node deep to the parotid was enlarged clinically at the time of  surgery but was negative   . Tachycardia-bradycardia syndrome ([place])     Medications:  Medications Prior to Admission  Medication Sig Dispense Refill Last Dose  . ARIPiprazole (ABILIFY) 20 MG tablet Take 20 mg by mouth daily.   03/27/2018 at 0915  . aspirin EC 81 MG tablet Take 81 mg by mouth daily.   Past Week at Unknown time  . atorvastatin (LIPITOR) 40 MG tablet Take 40 mg by mouth every morning.    03/27/2018 at 0915  . levothyroxine (SYNTHROID) 50 MCG tablet Take 1 tablet (50 mcg total) by mouth daily before breakfast. 30 tablet 3 03/27/2018 at 0915  . losartan (COZAAR) 50 MG tablet Take 50 mg by mouth every morning.    03/27/2018 at 0915  . metoprolol tartrate (LOPRESSOR) 25 MG tablet TAKE 1 TABLET TWICE A DAY 180 tablet 0 03/27/2018 at 0915  . naproxen sodium (ALEVE) 220 MG tablet Take 440 mg by mouth every 12 (twelve) hours as needed. For headaches   Past Month at Unknown time  . potassium citrate (UROCIT-K) 10  MEQ (1080 MG) SR tablet Take 10 mEq by mouth 3 (three) times daily with meals.   03/27/2018 at 0915  . rivaroxaban (XARELTO) 20 MG TABS tablet Take 1 tablet (20 mg total) by mouth daily. 90 tablet 3 03/27/2018 at 0915    Assessment: 84 yo male who presented with melena, weakness, fatigue, and SOB to the ED. On chronic Xarelto for atrial fibrillation which has been stopped. A fecal occult blood test was also positive, suggesting GIB. Hemoglobin on admission was 6.4 with a previous record of around 14. Patient transfused 2 units PRBC. Pharmacy asked to dose IV iron.   Goal of Therapy:  Replace iron if low  Plan:  Order Ferritin, Iron, TIBC, transferrin F/u labs and order IV iron in AM  Isac Sarna, BS Vena [place], BCPS Clinical Pharmacist Pager [phone] 03/28/2018,4:08 PM

## 2018-03-28 NOTE — Progress Notes (Addendum)
Patient ID: Fernando [name], male   DOB: 1927/05/13, 84 y.o.   MRN: [id number]   Assessment/Plan: ADMITTED WITH MELENA AND ANEMIA FOR > 1 MO. NO BRBPR OR MELENA. TOLERATING POs.  PLAN: 1. EGD MAY 5 2. NPO AFTER MN EXCEPT SIPS WITH MEDS 3. PROTONIX PO BID. PO AS EFFECTIVE AS IV. 4. HOLD IRON STUDIES. WILL REFLECT TRANSFUSED BLOOD AND NOT REPRESENT PT BASELINE STATUS.   Subjective: Since I last evaluated the patient HE HAS NO questions or concerns. Wants to confirm he has his EGD May 5.  Objective: Vital signs in last 24 hours: Vitals:   03/27/18 2130 03/28/18 0500  BP: 133/78 (!) 159/97  Pulse: 64 60  Resp: 18 20  Temp: 98.2 F (36.8 C) 97.7 F (36.5 C)  SpO2: 98% 95%   General appearance: alert, cooperative and no distress Resp: clear to auscultation bilaterally Cardio: regular rate and REGULAR rhythm GI: soft, non-tender; bowel sounds normal;   Lab Results:   MAY 3  MAY 4 Hb  6.4  8.8 Cr     1.46   Studies/Results: No results found.  Medications: I have reviewed the patient's current medications.

## 2018-03-29 ENCOUNTER — Encounter (HOSPITAL_COMMUNITY): Payer: Self-pay | Admitting: *Deleted

## 2018-03-29 ENCOUNTER — Telehealth: Payer: Self-pay | Admitting: Gastroenterology

## 2018-03-29 ENCOUNTER — Encounter (HOSPITAL_COMMUNITY)
Admission: EM | Disposition: A | Discharge status: Home / Self Care | Payer: Self-pay | Source: Home / Self Care | Attending: Internal Medicine

## 2018-03-29 DIAGNOSIS — K228 Other specified diseases of esophagus: Secondary | ICD-10-CM

## 2018-03-29 DIAGNOSIS — K259 Gastric ulcer, unspecified as acute or chronic, without hemorrhage or perforation: Secondary | ICD-10-CM

## 2018-03-29 HISTORY — PX: ESOPHAGOGASTRODUODENOSCOPY: SHX5428

## 2018-03-29 LAB — CBC
HCT: 26.7 % — ABNORMAL LOW (ref 39.0–52.0)
HEMOGLOBIN: 8.3 g/dL — AB (ref 13.0–17.0)
MCH: 23.4 pg — ABNORMAL LOW (ref 26.0–34.0)
MCHC: 31.1 g/dL (ref 30.0–36.0)
MCV: 75.4 fL — ABNORMAL LOW (ref 78.0–100.0)
Platelets: 223 10*3/uL (ref 150–400)
RBC: 3.54 MIL/uL — AB (ref 4.22–5.81)
RDW: 19.6 % — ABNORMAL HIGH (ref 11.5–15.5)
WBC: 5.1 10*3/uL (ref 4.0–10.5)

## 2018-03-29 SURGERY — EGD (ESOPHAGOGASTRODUODENOSCOPY)
Anesthesia: Moderate Sedation

## 2018-03-29 MED ORDER — MEPERIDINE HCL 100 MG/ML IJ SOLN
INTRAMUSCULAR | Status: AC
  Filled 2018-03-29: qty 2

## 2018-03-29 MED ORDER — MIDAZOLAM HCL 5 MG/5ML IJ SOLN
INTRAMUSCULAR | Status: DC | PRN
  Administered 2018-03-29: 1 mg via INTRAVENOUS
  Administered 2018-03-29: 2 mg via INTRAVENOUS

## 2018-03-29 MED ORDER — SODIUM CHLORIDE 0.9 % IV SOLN
510.0000 mg | Freq: Once | INTRAVENOUS | Status: AC
  Administered 2018-03-29: 510 mg via INTRAVENOUS
  Filled 2018-03-29: qty 17

## 2018-03-29 MED ORDER — MIDAZOLAM HCL 5 MG/5ML IJ SOLN
INTRAMUSCULAR | Status: AC
  Filled 2018-03-29: qty 10

## 2018-03-29 MED ORDER — LIDOCAINE VISCOUS 2 % MT SOLN
OROMUCOSAL | Status: DC | PRN
  Administered 2018-03-29: 1 via OROMUCOSAL

## 2018-03-29 MED ORDER — ASPIRIN EC 81 MG PO TBEC: 81.0000 mg | DELAYED_RELEASE_TABLET | Freq: Every day | ORAL | Status: DC

## 2018-03-29 MED ORDER — LIDOCAINE VISCOUS 2 % MT SOLN
OROMUCOSAL | Status: AC
  Filled 2018-03-29: qty 15

## 2018-03-29 MED ORDER — PANTOPRAZOLE SODIUM 40 MG PO TBEC: 40.0000 mg | DELAYED_RELEASE_TABLET | Freq: Two times a day (BID) | ORAL | 3 refills | Status: DC

## 2018-03-29 MED ORDER — MEPERIDINE HCL 100 MG/ML IJ SOLN
INTRAMUSCULAR | Status: DC | PRN
  Administered 2018-03-29: 25 mg via INTRAVENOUS

## 2018-03-29 MED ORDER — RIVAROXABAN 20 MG PO TABS: 20.0000 mg | ORAL_TABLET | Freq: Every day | ORAL | Status: DC

## 2018-03-29 NOTE — Progress Notes (Signed)
Patient discharged home.  IV removed - WNL.  Reviewed AVS and medications with patient and family.  Verbalize understanding.  Instructed to follow up with GI and PCP.  Patient has no questions.  Assisted off unit via WC - in NAD

## 2018-03-29 NOTE — Discharge Summary (Signed)
Physician Discharge Summary  Fernando [name] OZD:[id number] DOB: 1927-07-24 DOA: 03/27/2018  PCP: Rosita Fire, MD  Admit date: 03/27/2018 Discharge date: 03/29/2018  Time spent: 35 minutes  Recommendations for Outpatient Follow-up:  1. Repeat CBC to follow hemoglobin trend 2. Repeat basic metabolic panel to follow electrolytes and renal function 3. Make sure the patient has no continue to use any NSAIDs. 4. Outpatient follow-up with gastroenterology service as instructed.   Discharge Diagnoses:  Principal Problem:   GI bleed Active Problems:   Atrial fibrillation (HCC)   Essential hypertension, benign   Esophageal dysphagia   Symptomatic anemia   Weakness   CAD (coronary artery disease), native coronary artery   Acute blood loss anemia   HLD (hyperlipidemia)   Depression   Discharge Condition: Stable and improved.  At discharge no further blood in his stool appreciated.  Patient instructed to follow-up with his PCP in 10 days.  No use of NSAIDs (except for his baby aspirin, that he can resume on 03/30/2018), resumption of Xarelto on 04/02/2018.  Diet recommendation: Heart healthy diet.  Filed Weights   03/27/18 1123 03/27/18 1540  Weight: 64.9 kg (143 lb) 64.7 kg (142 lb 10.2 oz)    History of present illness:  84 year old male with a past medical history significant for carotid artery stenosis, atrial fibrillation (with tachybradycardia syndrome, status post pacemaker implantation on chronically on Xarelto), history of coronary artery disease, hyperlipidemia, hypertension, depressionand dysphagia;who presented to the emergency department secondary to weakness, increased fatigue, shortness of breath and melanotic stools. Patient reported that his symptoms have been present on and off for the last 4 to 28-month, with drastic worsening in his capacity to perform activities in the last 3 days or so. Patient was seen by his PCP and blood work was done demonstrating a low hemoglobin.  Patient was instructed to come to the emergency department for further evaluation. While in the ED repeat blood work show a hemoglobin of 6.4 (last hemoglobin on record was around 14). A fecal occult blood test was also positive, suggesting GIB. Patient denies chest pain, fever, cough, hematuria, abdominal pain, focal weakness or neurologic deficits.  Hospital Course:  1-GI bleed:Most likely upper GI in nature secondary to melanotic stools.Probably gastritis, esophagitis or PUD. -Patient with symptomatic anemia on admission. At discharge was asymptomatic and feeling better.  -Hgb improved after 2 units of PRBC's  -will follow GI recommendations and discharge home with use of PPI BID. Repeat EGD in 3 months. Continue holding xarelto un5/9/19 and ok to resume baby aspirin on 03/30/18. No further use of any other NSAID's.  -Patient has remained hemodynamically stable.   2-atrial fibrillation ([place]) -Patient is status post pacemaker due to tachybradycardia syndrome with underlying atrial fibrillation. -rate has remained controlled -continue metoprolol -continue holding xarelto until 04/02/18 as indicated by GI   3-Essential hypertension, benign -BP fairly well controlled -continue current antihypertensive regimen -heart healthy diet recommended   4-Esophageal dysphagia -continue PPI  -possible dilatation down the road as per GI reports on EGD dictation.    5-Symptomatic anemia -Secondary to GI bleed -Hemoglobin improved after 2 units of PRBCs and currently 8.3  -IV iron infuse prior to discharge -continue PPI BID as indicated by GI service (for a total of 3 months, then can be change to daily) -avoid use of NSAID's -continue holding xarelto until 04/02/18  6-Weakness -Patient feeling better after transfusion -at this moment would like to go home with family care. -no acute complaints.   7-CAD (coronary artery disease),  native coronary artery -Has remained chest pain-free and  reports improvement in his DOE symptoms. -Continue holding aspirin due to acute GI bleed until 03/30/18 -Will continue beta-blocker and statins.    8-HLD (hyperlipidemia) -Continue statins.  9-Depression -No suicidal ideation or hallucinations -Continue Abilify.   10-acute on chronic renal failure  -patient with chronic stage 3 at baseline  -most likely due to poor perfusion with acute blood loss anemia and pre-renal azotemia (as family expressed, patient has not been eating or drinking much lately) -improved with transfusion and IVF's -ok to resume home antihypertensive meds -repeat BMET at follow up visit to reassess Cr trend.   Procedures:  EGD: Which demonstrated esophagitis and gastric ulcer.  Consultations:  Gastroenterology service  Discharge Exam: Vitals:   03/29/18 1040 03/29/18 1114  BP: (!) 143/74 (!) 173/66  Pulse: (!) 39 (!) 59  Resp: 20 16  Temp:    SpO2: (!) 85% 99%    General: Feeling much better, no chest pain, no palpitations, no abdominal pain, no nausea or vomiting.  Patient reports no further black stools.  Cardiovascular: Rate controlled, no JVD, no rubs, no gallops, positive systolic ejection murmur.  Respiratory: Good air movement bilaterally, no wheezing, no crackles, no using accessory muscles.  Abdomen: Soft, nontender, nondistended, positive bowel sounds  Musculoskeletal: No edema, no cyanosis, no clubbing.   Discharge Instructions   Discharge Instructions    Diet - low sodium heart healthy   Complete by:  As directed    Discharge instructions   Complete by:  As directed    Take medications as prescribed  Please resume baby aspirin (enteric coated) on 03/30/18 No other NSAID's Ok to resume xarelto on 04/02/18 Follow up with GI for repeat endoscopy in 3 months Arrange follow up with PCP in 10 days  Keep yourself well hydrated  Follow soft diet     Allergies as of 03/29/2018   No Known Allergies     Medication List    TAKE  these medications   ARIPiprazole 20 MG tablet Commonly known as:  ABILIFY Take 20 mg by mouth daily.   aspirin EC 81 MG tablet Take 1 tablet (81 mg total) by mouth daily. Start taking on:  03/30/2018   atorvastatin 40 MG tablet Commonly known as:  LIPITOR Take 40 mg by mouth every morning.   levothyroxine 50 MCG tablet Commonly known as:  SYNTHROID Take 1 tablet (50 mcg total) by mouth daily before breakfast.   losartan 50 MG tablet Commonly known as:  COZAAR Take 50 mg by mouth every morning.   metoprolol tartrate 25 MG tablet Commonly known as:  LOPRESSOR TAKE 1 TABLET TWICE A DAY   pantoprazole 40 MG tablet Commonly known as:  PROTONIX Take 1 tablet (40 mg total) by mouth 2 (two) times daily before a meal. Twice a day for 3 months and then start taking daily.   potassium citrate 10 MEQ (1080 MG) SR tablet Commonly known as:  UROCIT-K Take 10 mEq by mouth 3 (three) times daily with meals.   rivaroxaban 20 MG Tabs tablet Commonly known as:  XARELTO Take 1 tablet (20 mg total) by mouth daily. Start taking on:  04/02/2018 What changed:  These instructions start on 04/02/2018. If you are unsure what to do until then, ask your doctor or other care provider.      No Known Allergies Follow-up Information    Rosita Fire, MD. Schedule an appointment as soon as possible for a visit in 10 day(s).  Specialty:  Internal Medicine Contact information: [place] [address] [phone]            The results of significant diagnostics from this hospitalization (including imaging, microbiology, ancillary and laboratory) are listed below for reference.    Significant Diagnostic Studies: No results found.  Microbiology: No results found for this or any previous visit (from the past 240 hour(s)).   Labs: Basic Metabolic Panel: Recent Labs  Lab 03/27/18 1041 03/27/18 1214 03/27/18 1217 03/28/18 0541  NA 135  --  134* 139  K 4.3  --  4.2 4.7   CL 106  --  104 107  CO2 18*  --  17* 21*  GLUCOSE 115*  --  112* 100*  BUN 38*  --  38* 38*  CREATININE 1.48*  --  1.49* 1.46*  CALCIUM 8.9  --  8.8* 8.7*  MG  --  2.0  --   --   PHOS  --  4.4  --   --    Liver Function Tests: Recent Labs  Lab 03/27/18 1217  AST 26  ALT 16*  ALKPHOS 67  BILITOT 1.0  PROT 7.1  ALBUMIN 3.5   No results for input(s): LIPASE, AMYLASE in the last 168 hours. No results for input(s): AMMONIA in the last 168 hours. CBC: Recent Labs  Lab 03/27/18 1041 03/28/18 0541 03/29/18 0602  WBC 5.1 5.0 5.1  NEUTROABS 3.3 3.2  --   HGB 6.4* 8.8* 8.3*  HCT 21.2* 26.9* 26.7*  MCV 71.1* 74.5* 75.4*  PLT 255 237 223   Cardiac Enzymes: No results for input(s): CKTOTAL, CKMB, CKMBINDEX, TROPONINI in the last 168 hours. BNP: BNP (last 3 results) No results for input(s): BNP in the last 8760 hours.  ProBNP (last 3 results) No results for input(s): PROBNP in the last 8760 hours.  CBG: No results for input(s): GLUCAP in the last 168 hours.     Signed:  Barton Dubois MD.  [facility] 03/29/2018, 12:53 PM

## 2018-03-29 NOTE — Telephone Encounter (Signed)
TRIAGE FOR EGD/POSSIBLE DILATION IN AUG 2019, Dx: DYSPHAGIA/PUD. NEEDS CBC IN PREOP AUG 2019.   OUTPATIENT VISIT IN 6 MOS W/ SLF ONLY, Dx: DYSPHAGIA/PUD/ANEMIA

## 2018-03-29 NOTE — Op Note (Addendum)
[facility] Patient Name: Fernando [name] Procedure Date: 03/29/2018 8:40 AM MRN: [id number] Date of Birth: Sep 10, 1927 Attending MD: Barney Drain MD, MD CSN: [id number] Age: 84 Admit Type: Inpatient Procedure:                Upper GI endoscopy WITH COLD FORCEPS BIOPSY Indications:              Melena ON ASA, NAPROXEN, AND XARELTO AND NO PPI. Providers:                Barney Drain MD, MD, Lurline Del, RN, Charlsie Quest                            Theda Sers RN, RN Referring MD:             Rosita Fire MD, MD Medicines:                Meperidine 25 mg IV, Midazolam 3 mg IV Complications:            No immediate complications. Estimated Blood Loss:     Estimated blood loss was minimal. Procedure:                Pre-Anesthesia Assessment:                           - Prior to the procedure, a History and Physical                            was performed, and patient medications and                            allergies were reviewed. The patient's tolerance of                            previous anesthesia was also reviewed. The risks                            and benefits of the procedure and the sedation                            options and risks were discussed with the patient.                            All questions were answered, and informed consent                            was obtained. Prior Anticoagulants: The patient has                            taken Xarelto (rivaroxaban), last dose was 2 days                            prior to procedure. ASA Grade Assessment: II - A                            patient with mild systemic disease. Prior  Anticoagulants: The patient last took aspirin 7                            days and naproxen 7 days prior to the procedure.                            ASA Grade Assessment: II - A patient with mild                            systemic disease. After reviewing the risks and                            benefits, the  patient was deemed in satisfactory                            condition to undergo the procedure. After obtaining                            informed consent, the endoscope was passed under                            direct vision. Throughout the procedure, the                            patient's blood pressure, pulse, and oxygen                            saturations were monitored continuously. The                            [id number] ([id number]) scope was introduced through the                            mouth, and advanced to the second part of duodenum.                            The upper GI endoscopy was accomplished without                            difficulty. The patient tolerated the procedure                            well. Scope In: 10:02:06 AM Scope Out: 10:18:22 AM Total Procedure Duration: 0 hours 16 minutes 16 seconds  Findings:      Salmon-colored mucosa was present. No other visible abnormalities were       present. The maximum longitudinal extent of these esophageal mucosal       changes was 5 cm in length.      One non-bleeding cratered gastric ulcer with no stigmata of bleeding was       found in the gastric body.      Diffuse severe inflammation characterized by congestion (edema),       erosions, erythema and friability was found in the entire examined  stomach. Biopsies were taken with a cold forceps for Helicobacter pylori       testing.      A small hiatal hernia was present.      The examined duodenum was normal. Impression:               - Salmon-colored mucosa suspicious for long-segment                            Barrett's esophagus.                           - MELENA DUE TO Gastric ulcer AND SEVERE EROSIVE                            GASTRITIS. PT IS LOW RISK FOR RE-BLEED.                           - Small hiatal hernia.                           - Moderate Sedation:      Moderate (conscious) sedation was administered by the endoscopy nurse        and supervised by the endoscopist. The following parameters were       monitored: oxygen saturation, heart rate, blood pressure, and response       to care. Total physician intraservice time was 26 minutes. Recommendation:           - Await pathology results.                           - Resume previous diet. OK TO D/C HOME TODAY.                           - Continue present medications. MAY RE-START ASA                            MAY 6, XARELTO MAY 9, AND AVOID NAPROXEN. Protonix                            bid for 3 mos then once daily forever.                           - Repeat upper endoscopy in 3 months to check                            healing of ulcer.                           - Return to GI office in 6 months.                           - Return patient to hospital ward for ongoing care. Procedure Code(s):        --- Professional ---  09811, Esophagogastroduodenoscopy, flexible,                            transoral; with biopsy, single or multiple                           G0500, Moderate sedation services provided by the                            same physician or other qualified health care                            professional performing a gastrointestinal                            endoscopic service that sedation supports,                            requiring the presence of an independent trained                            observer to assist in the monitoring of the                            patient's level of consciousness and physiological                            status; initial 15 minutes of intra-service time;                            patient age 22 years or older (additional time may                            be reported with [phone], as appropriate)                           [phone], Moderate sedation services provided by the                            same physician or other qualified health care                            professional  performing the diagnostic or                            therapeutic service that the sedation supports,                            requiring the presence of an independent trained                            observer to assist in the monitoring of the  patient's level of consciousness and physiological                            status; each additional 15 minutes intraservice                            time (List separately in addition to code for                            primary service) Diagnosis Code(s):        --- Professional ---                           K22.8, Other specified diseases of esophagus                           K25.9, Gastric ulcer, unspecified as acute or                            chronic, without hemorrhage or perforation                           K29.70, Gastritis, unspecified, without bleeding                           K44.9, Diaphragmatic hernia without obstruction or                            gangrene                           K92.1, Melena (includes Hematochezia) CPT copyright 2017 American Medical Association. All rights reserved. The codes documented in this report are preliminary and upon coder review may  be revised to meet current compliance requirements. Barney Drain, MD Barney Drain MD, MD 03/29/2018 10:30:11 AM This report has been signed electronically. Number of Addenda: 0

## 2018-03-29 NOTE — Interval H&P Note (Signed)
History and Physical Interval Note:  03/29/2018 9:50 AM  Fernando [name]  has presented today for surgery, with the diagnosis of MELENA  The various methods of treatment have been discussed with the patient and family. After consideration of risks, benefits and other options for treatment, the patient has consented to  Procedure(s): ESOPHAGOGASTRODUODENOSCOPY (EGD) (N/A) as a surgical intervention .  The patient's history has been reviewed, patient examined, no change in status, stable for surgery.  I have reviewed the patient's chart and labs.  Questions were answered to the patient's satisfaction.     [employer]

## 2018-03-29 NOTE — Progress Notes (Signed)
MEDICATION RELATED CONSULT NOTE - follow up  Pharmacy Consult for IV iron Indication: iron deficient anemia  No Known Allergies  Patient Measurements: Height: 5\' 4"  (162.6 cm) Weight: 142 lb 10.2 oz (64.7 kg) IBW/kg (Calculated) : 59.2 Adjusted Body Weight: 61.7kg  Vital Signs: Temp: 97.9 F (36.6 C) (05/05 0451) Temp Source: Oral (05/05 0451) BP: 122/65 (05/05 0451) Pulse Rate: 62 (05/05 0451) Intake/Output from previous day: 05/04 0701 - 05/05 0700 In: 5621 [P.O.:1200; I.V.:153] Out: -  Intake/Output from this shift: No intake/output data recorded.  Labs: Recent Labs    03/27/18 1041 03/27/18 1214 03/27/18 1217 03/28/18 0541 03/29/18 0602  WBC 5.1  --   --  5.0 5.1  HGB 6.4*  --   --  8.8* 8.3*  HCT 21.2*  --   --  26.9* 26.7*  PLT 255  --   --  237 223  CREATININE 1.48*  --  1.49* 1.46*  --   MG  --  2.0  --   --   --   PHOS  --  4.4  --   --   --   ALBUMIN  --   --  3.5  --   --   PROT  --   --  7.1  --   --   AST  --   --  26  --   --   ALT  --   --  16*  --   --   ALKPHOS  --   --  67  --   --   BILITOT  --   --  1.0  --   --    Estimated Creatinine Clearance: 29.3 mL/min (A) (by C-G formula based on SCr of 1.46 mg/dL (H)).   Microbiology: No results found for this or any previous visit (from the past 720 hour(s)).  Medical History: Past Medical History:  Diagnosis Date  . Arthritis   . Atrial fibrillation ([place])   . Carotid artery disease ([place])    Dr. Kellie Simmering - s/p left CEA  . Coronary artery disease   . Essential hypertension, benign   . Hyperlipidemia   . 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 This mass was initially felt to be 1.7 cm at the time of presentation but had enlarged significantly to 4 cm clinically at the time of surgery. Pathologically it was 3.4 cm. A lymph node deep to the parotid was  enlarged clinically at the time of surgery but was negative   . Tachycardia-bradycardia syndrome ([place])     Medications:  Medications Prior to Admission  Medication Sig Dispense Refill Last Dose  . ARIPiprazole (ABILIFY) 20 MG tablet Take 20 mg by mouth daily.   03/27/2018 at 0915  . aspirin EC 81 MG tablet Take 81 mg by mouth daily.   Past Week at Unknown time  . atorvastatin (LIPITOR) 40 MG tablet Take 40 mg by mouth every morning.    03/27/2018 at 0915  . levothyroxine (SYNTHROID) 50 MCG tablet Take 1 tablet (50 mcg total) by mouth daily before breakfast. 30 tablet 3 03/27/2018 at 0915  . losartan (COZAAR) 50 MG tablet Take 50 mg by mouth every morning.    03/27/2018 at 0915  . metoprolol tartrate (LOPRESSOR) 25 MG tablet TAKE 1 TABLET TWICE A DAY 180 tablet 0 03/27/2018 at 0915  . naproxen sodium (ALEVE) 220 MG  tablet Take 440 mg by mouth every 12 (twelve) hours as needed. For headaches   Past Month at Unknown time  . potassium citrate (UROCIT-K) 10 MEQ (1080 MG) SR tablet Take 10 mEq by mouth 3 (three) times daily with meals.   03/27/2018 at 0915  . rivaroxaban (XARELTO) 20 MG TABS tablet Take 1 tablet (20 mg total) by mouth daily. 90 tablet 3 03/27/2018 at 0915    Assessment: 84 yo male who presented with melena, weakness, fatigue, and SOB to the ED. On chronic Xarelto for atrial fibrillation which has been stopped. A fecal occult blood test was also positive, suggesting GIB. Hemoglobin on admission was 6.4 with a previous record of around 14. Patient transfused 2 units PRBC. Pharmacy asked to dose IV iron.  D/W Dr. Dyann Kief, and baseline iron panel discontinued by Dr. Oneida Alar. Will proceed with IV iron  Goal of Therapy:  Replace iron   Plan:  Feraheme 510mg  IV x 1 dose F/U with GI or PCP for further iron dosing Will sign off, please reconsult if needed  Thanks for the opportunity to participate in the care of this patient  Isac Sarna, BS [place]ena [place], [place] Pharmacist Pager  [phone] 03/29/2018,9:03 AM

## 2018-03-30 ENCOUNTER — Telehealth: Payer: Self-pay

## 2018-03-30 ENCOUNTER — Other Ambulatory Visit: Payer: Self-pay | Admitting: *Deleted

## 2018-03-30 DIAGNOSIS — R1310 Dysphagia, unspecified: Secondary | ICD-10-CM

## 2018-03-30 DIAGNOSIS — K279 Peptic ulcer, site unspecified, unspecified as acute or chronic, without hemorrhage or perforation: Secondary | ICD-10-CM

## 2018-03-30 DIAGNOSIS — R1319 Other dysphagia: Secondary | ICD-10-CM

## 2018-03-30 NOTE — Telephone Encounter (Signed)
ON RECALL  °

## 2018-03-30 NOTE — Telephone Encounter (Signed)
Spoke with RDS nurse.  Will schedule Pt for f/u with Dr. Lovena Le in a month.  Pt information given to scheduling.

## 2018-03-30 NOTE — Telephone Encounter (Signed)
Pt hospitalized with GI bleed and symptomatic anemia.  No further action needed at this time.  Pt with PCP scheduled f/u 10 days s/p hospitaliztion.  Will cont to follow.

## 2018-03-30 NOTE — Telephone Encounter (Signed)
Spoke with pt and he is scheduled for 06/29/18 at 8:30am. Instructions mailed.

## 2018-03-31 ENCOUNTER — Encounter (HOSPITAL_COMMUNITY): Payer: Self-pay | Admitting: Gastroenterology

## 2018-04-02 ENCOUNTER — Telehealth: Payer: Self-pay | Admitting: Gastroenterology

## 2018-04-02 NOTE — Telephone Encounter (Signed)
Called daughter, Milana Obey @ [phone] and many rings and no answer.

## 2018-04-02 NOTE — Telephone Encounter (Signed)
Please call pt'S DAUGHTER. His stomach Bx shows gastritis DUE TO ASA/NAPROXEN USE.   -HE SHOULD'VE RE-STARTED ASA MAY 6, & XARELTO MAY 9, AND AVOID NAPROXEN.  -Protonix bid for 3 mos then once daily forever. - TRIAGE FOR Repeat upper endoscopy in 3 months to check healing of ulcer. - Return to GI office in 6 months E30 GI BLEED/GASTRIC ULCER.

## 2018-04-03 NOTE — Telephone Encounter (Signed)
ON RECALL  °

## 2018-04-06 ENCOUNTER — Other Ambulatory Visit: Payer: Self-pay

## 2018-04-06 NOTE — Patient Outreach (Signed)
[facility]) Care Management  04/06/2018  Kito Cuffe 04-27-1927 [id number]   EMMI- General Discharge RED ON EMMI ALERT Day # 4 Date: 04/06/18 Red Alert Reason: Scheduled Follow up? no  Outreach attempt # 1 to patient for EMMI follow up.  No answer.  HIPAA compliant voice message left.    Plan: RN CM will send letter and attempt patient again within 4 business days.  Jone Baseman, RN, MSN [facility] Care Management Care Management Coordinator Direct Line [phone] Toll Free: [phone]  Fax: [phone]

## 2018-04-07 ENCOUNTER — Other Ambulatory Visit: Payer: Self-pay

## 2018-04-07 NOTE — Patient Outreach (Signed)
[facility]) Care Management  04/07/2018  Khalib Fendley 07-10-1927 [id number]    EMMI- General Discharge RED ON EMMI ALERT Day # 4 Date:04/06/18 Red Alert Reason:Scheduled Follow up? no   Telephone call to patient for EMMI follow up.  Spoke with son Kawhi Diebold.  He is able to verify HIPAA. Patient is HOH.  He states that patient is doing much better and was very impressed by hospital stay.  Addressed red alert.  Son reports that patient had not made a follow up appointment as patient had a stomach bug and was waiting for him to get better. He states that he will call to make an appointment today.  Stressed importance of follow up for blood work to make sure patient blood counts are staying up.  He verbalized understanding.  He states that patient has all his medication and offers no problems with medications.    Discussed [facility] Care Management services.  He declines any needs at this time and no needs assessed.   Plan: RN CM will close case and send letter.  Jone Baseman, RN, MSN [facility] Care Management Care Management Coordinator Direct Line [phone] Toll Free: [phone]  Fax: [phone]

## 2018-04-07 NOTE — Telephone Encounter (Signed)
Pt's daughter, Lynelle Smoke, is aware.

## 2018-04-07 NOTE — Telephone Encounter (Signed)
LMOM for a return call from Tammy.

## 2018-04-09 DIAGNOSIS — K922 Gastrointestinal hemorrhage, unspecified: Secondary | ICD-10-CM | POA: Diagnosis not present

## 2018-04-09 DIAGNOSIS — K228 Other specified diseases of esophagus: Secondary | ICD-10-CM | POA: Diagnosis not present

## 2018-04-09 DIAGNOSIS — I10 Essential (primary) hypertension: Secondary | ICD-10-CM | POA: Diagnosis not present

## 2018-04-09 DIAGNOSIS — I4891 Unspecified atrial fibrillation: Secondary | ICD-10-CM | POA: Diagnosis not present

## 2018-04-14 ENCOUNTER — Telehealth: Payer: Self-pay

## 2018-04-14 NOTE — Telephone Encounter (Addendum)
pts son- Joesph Marcy called- pt has been coughing up bright red blood first thing every morning since he came home from the hospital,  It looks like small blood clots, about 1/4-1/2 inch in size. It normally only happens in the morning, but sometimes it will happen at later times in the day. They went to Dr.Fanta and was told to take robitussin and it has not helped. He said the pt did get the flu after he left the hospital but he recovered quickly. He is feeling ok now, his stools are normal and not dark anymore. He has resumed his normal activities.  The pts children are just concerned about this and want to know if they need to do anything. They are aware [facility] is doing procedures today and someone will call them back as soon as they can.    SonHasheem Voland- [phone] Daughter- Milana Obey- [phone]

## 2018-04-14 NOTE — Telephone Encounter (Signed)
PLEASE CALL PT'S FAMILY. He's on blood thinner so any irritation in his airways will cause them to see blood in his sputum . If it continues they should ask for a referral to see Pulmonology. USING A HUMIDFIER WHEN HE SLEEPS WILL ALSO HELP.

## 2018-04-14 NOTE — Telephone Encounter (Signed)
Tried to call son- NA Called and spoke with Tammy and she is aware of [facility] recommendations.

## 2018-04-23 ENCOUNTER — Ambulatory Visit (INDEPENDENT_AMBULATORY_CARE_PROVIDER_SITE_OTHER): Payer: PPO | Admitting: *Deleted

## 2018-04-23 DIAGNOSIS — I495 Sick sinus syndrome: Secondary | ICD-10-CM

## 2018-04-23 NOTE — Progress Notes (Signed)
Remote pacemaker transmission.   

## 2018-04-27 ENCOUNTER — Encounter: Payer: Self-pay | Admitting: Cardiology

## 2018-04-28 ENCOUNTER — Encounter (INDEPENDENT_AMBULATORY_CARE_PROVIDER_SITE_OTHER): Payer: Self-pay

## 2018-04-28 ENCOUNTER — Ambulatory Visit: Payer: PPO | Admitting: Internal Medicine

## 2018-04-28 ENCOUNTER — Encounter: Payer: Self-pay | Admitting: Internal Medicine

## 2018-04-28 VITALS — BP 124/80 | HR 60 | Ht 64.0 in | Wt 140.0 lb

## 2018-04-28 DIAGNOSIS — I482 Chronic atrial fibrillation, unspecified: Secondary | ICD-10-CM

## 2018-04-28 DIAGNOSIS — I4819 Other persistent atrial fibrillation: Secondary | ICD-10-CM

## 2018-04-28 DIAGNOSIS — I495 Sick sinus syndrome: Secondary | ICD-10-CM | POA: Diagnosis not present

## 2018-04-28 DIAGNOSIS — I10 Essential (primary) hypertension: Secondary | ICD-10-CM | POA: Diagnosis not present

## 2018-04-28 DIAGNOSIS — I481 Persistent atrial fibrillation: Secondary | ICD-10-CM

## 2018-04-28 DIAGNOSIS — Z950 Presence of cardiac pacemaker: Secondary | ICD-10-CM | POA: Diagnosis not present

## 2018-04-28 LAB — CUP PACEART INCLINIC DEVICE CHECK
Brady Statistic RV Percent Paced: 95 %
Implantable Lead Implant Date: 20131107
Implantable Lead Location: 753859
Implantable Pulse Generator Implant Date: 20131107
Lead Channel Impedance Value: 380 Ohm
Lead Channel Pacing Threshold Amplitude: 0.75 V
Lead Channel Pacing Threshold Pulse Width: 0.4 ms
Lead Channel Sensing Intrinsic Amplitude: 11.9 mV
Lead Channel Setting Pacing Amplitude: 0.875
Lead Channel Setting Pacing Pulse Width: 0.4 ms
MDC IDC LEAD IMPLANT DT: 20131107
MDC IDC LEAD LOCATION: 753860
MDC IDC MSMT BATTERY REMAINING LONGEVITY: 69 mo
MDC IDC MSMT BATTERY REMAINING PERCENTAGE: 51 %
MDC IDC MSMT BATTERY VOLTAGE: 2.87 V
MDC IDC SESS DTM: 20190604110724
MDC IDC SET LEADCHNL RV SENSING SENSITIVITY: 4 mV
Pulse Gen Serial Number: 7411696

## 2018-04-28 MED ORDER — RIVAROXABAN 15 MG PO TABS: 15.0000 mg | ORAL_TABLET | Freq: Every day | ORAL | 3 refills | Status: DC

## 2018-04-28 NOTE — Patient Instructions (Addendum)
Medication Instructions:  Your physician has recommended you make the following change in your medication:  1.  Stop taking ASPIRIN 2.  Reduce your XARELTO to 15 mg one tablet by mouth daily with supper.  Labwork: None ordered.  Testing/Procedures: None ordered.  Follow-Up: Your physician wants you to follow-up in: one year with Dr. Lovena Le.   You will receive a reminder letter in the mail two months in advance. If you don't receive a letter, please call our office to schedule the follow-up appointment.  Remote monitoring is used to monitor your Pacemaker from home. This monitoring reduces the number of office visits required to check your device to one time per year. It allows [place] to keep an eye on the functioning of your device to ensure it is working properly. You are scheduled for a device check from home on 07/23/2018. You may send your transmission at any time that day. If you have a wireless device, the transmission will be sent automatically. After your physician reviews your transmission, you will receive a postcard with your next transmission date.  Any Other Special Instructions Will Be Listed Below (If Applicable).  If you need a refill on your cardiac medications before your next appointment, please call your pharmacy.

## 2018-04-28 NOTE — Progress Notes (Signed)
HPI [name] returns today for PPM followup. He is a pleasant elderly man with chronic atrial fib, CHB, s/p PPM insertion who has recently had trouble with anemia due to GI bleeding. He had ulcers. He is better although chronically anemic. He bleeds very easily and his son who is his caretaker wonders about ASA and the dose of Xarelto. He admits to sodium indiscretion.  No Known Allergies   Current Outpatient Medications  Medication Sig Dispense Refill  . ARIPiprazole (ABILIFY) 20 MG tablet Take 20 mg by mouth daily.    Marland Kitchen aspirin EC 81 MG tablet Take 1 tablet (81 mg total) by mouth daily.    Marland Kitchen atorvastatin (LIPITOR) 40 MG tablet Take 40 mg by mouth every morning.     Marland Kitchen levothyroxine (SYNTHROID) 50 MCG tablet Take 1 tablet (50 mcg total) by mouth daily before breakfast. 30 tablet 3  . losartan (COZAAR) 50 MG tablet Take 50 mg by mouth every morning.     . metoprolol tartrate (LOPRESSOR) 25 MG tablet TAKE 1 TABLET TWICE A DAY 180 tablet 0  . pantoprazole (PROTONIX) 40 MG tablet Take 1 tablet (40 mg total) by mouth 2 (two) times daily before a meal. Twice a day for 3 months and then start taking daily. 60 tablet 3  . potassium citrate (UROCIT-K) 10 MEQ (1080 MG) SR tablet Take 10 mEq by mouth 3 (three) times daily with meals.    . rivaroxaban (XARELTO) 20 MG TABS tablet Take 1 tablet (20 mg total) by mouth daily.     No current facility-administered medications for this visit.      Past Medical History:  Diagnosis Date  . Arthritis   . Atrial fibrillation ([place])   . Carotid artery disease ([place])    Dr. Kellie Simmering - s/p left CEA  . Coronary artery disease   . Essential hypertension, benign   . Hyperlipidemia   . 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 This mass was initially felt to be 1.7 cm at the time of presentation but had enlarged  significantly to 4 cm clinically at the time of surgery. Pathologically it was 3.4 cm. A lymph node deep to the parotid was enlarged clinically at the time of surgery but was negative   . Tachycardia-bradycardia syndrome ([place])     ROS:   All systems reviewed and negative except as noted in the HPI.   Past Surgical History:  Procedure Laterality Date  . CAROTID ENDARTERECTOMY Left Jan. 3, 2009   CE  . CATARACT EXTRACTION W/ INTRAOCULAR LENS IMPLANT     Bilateral  . ESOPHAGOGASTRODUODENOSCOPY N/A 03/29/2018   Procedure: ESOPHAGOGASTRODUODENOSCOPY (EGD);  Surgeon: Danie Binder, MD;  [facility];  Service: Endoscopy;  Laterality: N/A;  . EYE SURGERY    . INSERT / REPLACE / REMOVE PACEMAKER    . PACEMAKER INSERTION  10-01-2012  . PARATHYROIDECTOMY  10/28/2012  . PAROTIDECTOMY  10/28/2012   Procedure: PAROTIDECTOMY;  Surgeon: Ascencion Dike, MD;  Location: [place];  Service: ENT;  Laterality: Right;  Total Parotidectomy  . PERMANENT PACEMAKER INSERTION N/A 10/01/2012   Procedure: PERMANENT PACEMAKER INSERTION;  Surgeon: Evans Lance, MD;  Location: [facility] CATH LAB;  Service: Cardiovascular;  Laterality: N/A;     Family History  Problem Relation Age of Onset  . Heart disease Sister   . Heart attack Sister   .  Cirrhosis Mother   . Cancer Brother        brain cancer  . Cancer Sister        stomach cancer     Social History   Socioeconomic History  . Marital status: Widowed    Spouse name: Not on file  . Number of children: Not on file  . Years of education: Not on file  . Highest education level: Not on file  Occupational History  . Not on file  Social Needs  . Financial resource strain: Not on file  . Food insecurity:    Worry: Not on file    Inability: Not on file  . Transportation needs:    Medical: Not on file    Non-medical: Not on file  Tobacco Use  . Smoking status: Former Smoker    Types: Cigarettes    Last attempt to quit: 11/25/1972    Years since  quitting: 45.4  . Smokeless tobacco: Never Used  Substance and Sexual Activity  . Alcohol use: Yes    Alcohol/week: 0.6 oz    Types: 1 Cans of beer per week    Comment: daily  . Drug use: No  . Sexual activity: Never  Lifestyle  . Physical activity:    Days per week: Not on file    Minutes per session: Not on file  . Stress: Not on file  Relationships  . Social connections:    Talks on phone: Not on file    Gets together: Not on file    Attends religious service: Not on file    Active member of club or organization: Not on file    Attends meetings of clubs or organizations: Not on file    Relationship status: Not on file  . Intimate partner violence:    Fear of current or ex partner: Not on file    Emotionally abused: Not on file    Physically abused: Not on file    Forced sexual activity: Not on file  Other Topics Concern  . Not on file  Social History Narrative  . Not on file     BP 124/80   Pulse 60   Ht 5\' 4"  (1.626 m)   Wt 140 lb (63.5 kg)   BMI 24.03 kg/m   Physical Exam:  Well appearing elderly man, NAD HEENT: Unremarkable Neck:  6 cm JVD, no thyromegally Lymphatics:  No adenopathy Back:  No CVA tenderness Lungs:  Clear with no wheezes HEART:  Regular rate rhythm, no murmurs, no rubs, no clicks Abd:  soft, positive bowel sounds, no organomegally, no rebound, no guarding Ext:  2 plus pulses, no edema, no cyanosis, no clubbing Skin:  No rashes no nodules Neuro:  CN II through XII intact, motor grossly intact  EKG - atrial fib with ventricular pacing  DEVICE  Normal device function.  See PaceArt for details.   Assess/Plan: 1. CHB - he is asymptomatic, s/p PPM insertion. 2. PPM - his St. Jude dual chamber PPM is working normally and is programmed VVI 3. Anemia - his blood counts have stabilized, and I have asked him to stop the ASA and reduce his dose of xarelto to 15 mg daily. 4. Atrial fib - his ventricular rates are well controlled. We will  follow.  Fernando [name].D.

## 2018-05-13 LAB — CUP PACEART REMOTE DEVICE CHECK
Battery Remaining Longevity: 74 mo
Battery Remaining Percentage: 51 %
Battery Voltage: 2.87 V
Brady Statistic RV Percent Paced: 95 %
Date Time Interrogation Session: 20190530071513
Implantable Lead Implant Date: 20131107
Implantable Lead Implant Date: 20131107
Implantable Lead Location: 753859
Implantable Lead Location: 753860
Implantable Pulse Generator Implant Date: 20131107
Lead Channel Impedance Value: 380 Ohm
Lead Channel Pacing Threshold Amplitude: 0.625 V
Lead Channel Pacing Threshold Pulse Width: 0.4 ms
Lead Channel Sensing Intrinsic Amplitude: 12 mV
Lead Channel Setting Pacing Amplitude: 0.875
Lead Channel Setting Pacing Pulse Width: 0.4 ms
Lead Channel Setting Sensing Sensitivity: 4 mV
Pulse Gen Model: 2210
Pulse Gen Serial Number: 7411696

## 2018-05-26 ENCOUNTER — Encounter: Payer: Self-pay | Admitting: Gastroenterology

## 2018-06-22 ENCOUNTER — Telehealth: Payer: Self-pay

## 2018-06-22 NOTE — Telephone Encounter (Signed)
PLEASE CALL PT. HIS EGD WAS TO MAKE SURE HIS ULCERS HAVE HEALED AND WERE NOT AN EARLY STOMACH CANCER. I UNDERSTAND HE DOESN'T WANT A BILL RIGHT NOW SO WHEN HE IS READY HE SHOULD CALL TO RESCHEDULE.

## 2018-06-22 NOTE — Telephone Encounter (Signed)
Pt's son called office to cancel EGD for 06/29/18. He is doing better and doesn't want to create any more bill right now. Endo scheduler informed to cancel.

## 2018-06-23 NOTE — Telephone Encounter (Signed)
Tried to call pt's son, Marcello Moores and many rings and no answer.

## 2018-06-24 NOTE — Telephone Encounter (Signed)
Mailed letter to pt and put ATTN to his son Marcello Moores.

## 2018-06-29 ENCOUNTER — Ambulatory Visit (HOSPITAL_COMMUNITY): Admission: RE | Admit: 2018-06-29 | Payer: PPO | Source: Ambulatory Visit | Admitting: Gastroenterology

## 2018-06-29 ENCOUNTER — Encounter (HOSPITAL_COMMUNITY): Admission: RE | Payer: Self-pay | Source: Ambulatory Visit

## 2018-06-29 SURGERY — EGD (ESOPHAGOGASTRODUODENOSCOPY)
Anesthesia: Moderate Sedation

## 2018-07-23 ENCOUNTER — Ambulatory Visit (INDEPENDENT_AMBULATORY_CARE_PROVIDER_SITE_OTHER): Payer: PPO | Admitting: *Deleted

## 2018-07-23 DIAGNOSIS — I495 Sick sinus syndrome: Secondary | ICD-10-CM | POA: Diagnosis not present

## 2018-07-23 NOTE — Progress Notes (Signed)
Remote pacemaker transmission.   

## 2018-07-24 ENCOUNTER — Encounter: Payer: Self-pay | Admitting: Cardiology

## 2018-08-24 LAB — CUP PACEART REMOTE DEVICE CHECK
Battery Remaining Percentage: 51 %
Battery Voltage: 2.87 V
Brady Statistic RV Percent Paced: 92 %
Implantable Lead Implant Date: 20131107
Implantable Lead Location: 753859
Lead Channel Impedance Value: 390 Ohm
Lead Channel Sensing Intrinsic Amplitude: 12 mV
Lead Channel Setting Pacing Amplitude: 1 V
Lead Channel Setting Pacing Pulse Width: 0.4 ms
Lead Channel Setting Sensing Sensitivity: 4 mV
MDC IDC LEAD IMPLANT DT: 20131107
MDC IDC LEAD LOCATION: 753860
MDC IDC MSMT BATTERY REMAINING LONGEVITY: 74 mo
MDC IDC MSMT LEADCHNL RV PACING THRESHOLD AMPLITUDE: 0.75 V
MDC IDC MSMT LEADCHNL RV PACING THRESHOLD PULSEWIDTH: 0.4 ms
MDC IDC PG IMPLANT DT: 20131107
MDC IDC SESS DTM: 20190829094437
Pulse Gen Model: 2210
Pulse Gen Serial Number: 7411696

## 2018-09-03 ENCOUNTER — Encounter: Payer: Self-pay | Admitting: Gastroenterology

## 2018-10-29 ENCOUNTER — Telehealth: Payer: Self-pay

## 2018-10-29 NOTE — Telephone Encounter (Signed)
Confirmed remote transmission w/ pt son.    

## 2018-11-03 ENCOUNTER — Encounter: Payer: Self-pay | Admitting: Cardiology

## 2018-11-20 DIAGNOSIS — I10 Essential (primary) hypertension: Secondary | ICD-10-CM | POA: Diagnosis not present

## 2018-11-20 DIAGNOSIS — D649 Anemia, unspecified: Secondary | ICD-10-CM | POA: Diagnosis not present

## 2018-11-20 DIAGNOSIS — Z1331 Encounter for screening for depression: Secondary | ICD-10-CM | POA: Diagnosis not present

## 2018-11-20 DIAGNOSIS — Z0001 Encounter for general adult medical examination with abnormal findings: Secondary | ICD-10-CM | POA: Diagnosis not present

## 2018-11-20 DIAGNOSIS — Z1389 Encounter for screening for other disorder: Secondary | ICD-10-CM | POA: Diagnosis not present

## 2018-11-20 DIAGNOSIS — E039 Hypothyroidism, unspecified: Secondary | ICD-10-CM | POA: Diagnosis not present

## 2018-11-20 DIAGNOSIS — I482 Chronic atrial fibrillation, unspecified: Secondary | ICD-10-CM | POA: Diagnosis not present

## 2018-11-20 DIAGNOSIS — E559 Vitamin D deficiency, unspecified: Secondary | ICD-10-CM | POA: Diagnosis not present

## 2018-11-20 DIAGNOSIS — C07 Malignant neoplasm of parotid gland: Secondary | ICD-10-CM | POA: Diagnosis not present

## 2018-11-24 ENCOUNTER — Telehealth: Payer: Self-pay

## 2018-11-24 NOTE — Telephone Encounter (Signed)
I called and informed Fernando [name], he said his dad had left the house and he doesn't know where he went. I told him to try to get in touch with him and get him to ED to be evaluated, he may need transfusion. He said he will let him know when he returns.

## 2018-11-24 NOTE — Telephone Encounter (Signed)
T/C from [place] at Dr. Josephine Cables office to report a Hemoglobin of 8.1. Dr. Legrand Rams is out of the country and they were just calling to inform GI for recommendations. She is faxing the lab over that was drawn on 11/20/2018. I am forwarding to Walden Field, NP who is doing hospital call today.

## 2018-11-24 NOTE — Telephone Encounter (Signed)
Fernando [name] returned call and pt denies any shortness of breath of chest pain. No blood in stool or black stool. He does have a little dizziness when he gets up to walk and is more fatigued than usual. [name], please advise!

## 2018-11-24 NOTE — Telephone Encounter (Signed)
At this point, given fatigue and dizziness with hgb down again to 8.1, he should probably be sent to the ER for further workup/evaluation

## 2018-11-24 NOTE — Telephone Encounter (Signed)
Call the patient and ask him:  Is he still taking the PPI (acid blocker)? Any dizziness, shortness of breath, chest pain, worsening fatigue/weakness? Any blood in his stools or black stools?

## 2018-11-24 NOTE — Telephone Encounter (Signed)
I called Fernando [name] and she does not have any other labs on him between May and now. The last previous before May when he had transfusion was 06/04/2017 and his hemoglobin was 10.

## 2018-11-24 NOTE — Telephone Encounter (Signed)
I called and pt's son, Marcello Moores, answered the phone, pt was in bathroom. Gershon Mussel said pt is taking the PPI bid according to the bottle instructions. He said pt is having more fatigue and weakness, he is not sure about the other symptoms. He will ask him when he comes out of the bathroom and call me back.

## 2018-11-26 DIAGNOSIS — E039 Hypothyroidism, unspecified: Secondary | ICD-10-CM | POA: Diagnosis not present

## 2018-11-26 DIAGNOSIS — I10 Essential (primary) hypertension: Secondary | ICD-10-CM | POA: Diagnosis not present

## 2018-11-26 DIAGNOSIS — I482 Chronic atrial fibrillation, unspecified: Secondary | ICD-10-CM | POA: Diagnosis not present

## 2018-11-26 DIAGNOSIS — D509 Iron deficiency anemia, unspecified: Secondary | ICD-10-CM | POA: Diagnosis not present

## 2018-11-26 NOTE — Telephone Encounter (Signed)
LMOM to call for the son.

## 2018-11-26 NOTE — Telephone Encounter (Signed)
Pt's son, Gershon Mussel, called this morning and said that his dad did not come back home until late on New Year's Eve. So they did not take him to the hospital yesterday, on New Year's day. He asked if he still needed to take him and I told him he should do so to have him evaluated. He asked if we could just order a transfusion and I told him that [name] had advised to go to the ED. He said he had a lot of family issues and it was hard to get everything done, but he will take him.

## 2018-11-26 NOTE — Telephone Encounter (Signed)
I just spoke to Tonia Ghent at Dr. Josephine Cables and she said their dr that is doing call for Dr. Legrand Rams has ordered an iron transfusion. It will be done over the next 2 Tuesdays. She said pt and family are aware.

## 2018-11-26 NOTE — Telephone Encounter (Signed)
We have not seen the patient in the office since 2007 (or at all). We were consulted during his last hospitalization. He has had, apparently, a 2 gm drop in hgb since July. I am not familiar enough with him to "just order a blood transfusion" (which would take a couple days to process and complete at least). If the patient is not wanting to take our advice for ER workup to try and find out why he's bleeding and treat the cause, I would recommend getting in touch with whoever is covering for Dr. Legrand Rams in his absence (we were initially sent his labs by their office and requested advice d/t Dr. Legrand Rams being out of the country.  Cc: [facility] for any further recommendations.

## 2018-11-26 NOTE — Telephone Encounter (Signed)
[name], Dr. Oneida Alar has left for the day. Any other recommendations?

## 2018-11-26 NOTE — Telephone Encounter (Signed)
Fernando [name] called from Dr. Josephine Cables office and said pt's son and daughter called and said pt is not going to the hospital. They are wanting someone to order blood transfusion if needed. Dr. Oneida Alar, can you advise on this since Dr. Legrand Rams is out of town and pt has declined to do what [name] said to do.

## 2018-11-27 NOTE — Telephone Encounter (Signed)
Noted  

## 2018-11-30 NOTE — Telephone Encounter (Signed)
LMOM for a return call from [place].

## 2018-11-30 NOTE — Telephone Encounter (Signed)
PT's son, Gershon Mussel returned the call and was informed. He said again, pt has appt for the iron transfusion tomorrow, 12/01/2018. I told him that Dr. Oneida Alar is aware of that, and went over her recommendations again. He said his Dad just simply refuses to go to the ED and he is not in favor of it either, with all of the flu going on and he is 85 years old. I told him that I would forward the message to Dr. Oneida Alar.

## 2018-11-30 NOTE — Telephone Encounter (Signed)
PLEASE CALL PT'S SON. PT WAS SUPPOSE TO RETURN FOR EGD TO CONFIRM HIS ULCER IS HEALED. HE WAS LAST SEEN IN MAY 2019 AND WE [place] PHONE. IN MAY 2019 HIS Hb WAS 8.3 AND HE WAS TAKING XARELTO. HE SHOULD PROCEED TO THE ED FOR AN EVALUATION AND ADMISSION FOR pRBCS AND AN EGD.

## 2018-12-01 ENCOUNTER — Encounter (HOSPITAL_COMMUNITY)
Admission: RE | Admit: 2018-12-01 | Discharge: 2018-12-01 | Disposition: A | Discharge status: Home / Self Care | Payer: PPO | Source: Ambulatory Visit | Attending: Internal Medicine | Admitting: Internal Medicine

## 2018-12-01 DIAGNOSIS — D649 Anemia, unspecified: Secondary | ICD-10-CM | POA: Insufficient documentation

## 2018-12-03 NOTE — Telephone Encounter (Signed)
REVIEWED-NO ADDITIONAL RECOMMENDATIONS. 

## 2018-12-08 ENCOUNTER — Encounter (HOSPITAL_COMMUNITY): Payer: Self-pay

## 2018-12-08 ENCOUNTER — Encounter (HOSPITAL_COMMUNITY)
Admission: RE | Admit: 2018-12-08 | Discharge: 2018-12-08 | Disposition: A | Discharge status: Home / Self Care | Payer: PPO | Source: Ambulatory Visit | Attending: Internal Medicine | Admitting: Internal Medicine

## 2018-12-08 DIAGNOSIS — D649 Anemia, unspecified: Secondary | ICD-10-CM | POA: Diagnosis not present

## 2018-12-08 MED ORDER — SODIUM CHLORIDE 0.9 % IV SOLN
Freq: Once | INTRAVENOUS | Status: AC
  Administered 2018-12-08: 10:00:00 via INTRAVENOUS

## 2018-12-08 MED ORDER — SODIUM CHLORIDE 0.9 % IV SOLN
510.0000 mg | Freq: Once | INTRAVENOUS | Status: AC
  Administered 2018-12-08: 510 mg via INTRAVENOUS
  Filled 2018-12-08: qty 17

## 2018-12-09 ENCOUNTER — Encounter: Payer: Self-pay | Admitting: Internal Medicine

## 2018-12-09 ENCOUNTER — Ambulatory Visit: Payer: PPO | Admitting: Internal Medicine

## 2018-12-09 DIAGNOSIS — I495 Sick sinus syndrome: Secondary | ICD-10-CM | POA: Diagnosis not present

## 2018-12-09 LAB — CUP PACEART INCLINIC DEVICE CHECK
Battery Remaining Longevity: 62 mo
Battery Voltage: 2.86 V
Brady Statistic RV Percent Paced: 91 %
Implantable Lead Implant Date: 20131107
Lead Channel Impedance Value: 387.5 Ohm
Lead Channel Pacing Threshold Amplitude: 0.75 V
Lead Channel Pacing Threshold Pulse Width: 0.4 ms
Lead Channel Sensing Intrinsic Amplitude: 0.2 mV
Lead Channel Setting Pacing Pulse Width: 0.4 ms
Lead Channel Setting Sensing Sensitivity: 4 mV
MDC IDC LEAD IMPLANT DT: 20131107
MDC IDC LEAD LOCATION: 753859
MDC IDC LEAD LOCATION: 753860
MDC IDC MSMT LEADCHNL RV PACING THRESHOLD AMPLITUDE: 0.75 V
MDC IDC MSMT LEADCHNL RV PACING THRESHOLD PULSEWIDTH: 0.4 ms
MDC IDC MSMT LEADCHNL RV SENSING INTR AMPL: 10.6 mV
MDC IDC PG IMPLANT DT: 20131107
MDC IDC PG SERIAL: 7411696
MDC IDC SESS DTM: 20200115142050
MDC IDC SET LEADCHNL RV PACING AMPLITUDE: 0.875
MDC IDC STAT BRADY RA PERCENT PACED: 0 %

## 2018-12-09 NOTE — Progress Notes (Signed)
HPI Fernando [name] returns today for followup of atrial fib, on Xarelto, sinus node dysfunction, s/p PPM, chronic diastolic heart failure andHTN. He was found to  Be anemic earlier and was transfused. GI workup was reported as negative. He has had some falls but has not injured himself. No chest pain.  No Known Allergies   Current Outpatient Medications  Medication Sig Dispense Refill  . ARIPiprazole (ABILIFY) 20 MG tablet Take 20 mg by mouth daily.    Marland Kitchen atorvastatin (LIPITOR) 40 MG tablet Take 40 mg by mouth every morning.     Marland Kitchen levothyroxine (SYNTHROID) 50 MCG tablet Take 1 tablet (50 mcg total) by mouth daily before breakfast. 30 tablet 3  . losartan (COZAAR) 50 MG tablet Take 50 mg by mouth every morning.     . metoprolol tartrate (LOPRESSOR) 25 MG tablet TAKE 1 TABLET TWICE A DAY 180 tablet 0  . pantoprazole (PROTONIX) 40 MG tablet Take 1 tablet (40 mg total) by mouth 2 (two) times daily before a meal. Twice a day for 3 months and then start taking daily. 60 tablet 3  . potassium citrate (UROCIT-K) 10 MEQ (1080 MG) SR tablet Take 10 mEq by mouth 3 (three) times daily with meals.    . Rivaroxaban (XARELTO) 15 MG TABS tablet Take 1 tablet (15 mg total) by mouth daily with supper. 90 tablet 3   No current facility-administered medications for this visit.      Past Medical History:  Diagnosis Date  . Arthritis   . Atrial fibrillation ([place])   . Carotid artery disease ([place])    Dr. Kellie Simmering - s/p left CEA  . Coronary artery disease   . Essential hypertension, benign   . Hyperlipidemia   . 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 This mass was initially felt to be 1.7 cm at the time of presentation but had enlarged significantly to 4 cm clinically at the time of surgery. Pathologically it was 3.4 cm. A lymph node deep to the parotid was enlarged  clinically at the time of surgery but was negative   . Tachycardia-bradycardia syndrome ([place])     ROS:   All systems reviewed and negative except as noted in the HPI.   Past Surgical History:  Procedure Laterality Date  . CAROTID ENDARTERECTOMY Left Jan. 3, 2009   CE  . CATARACT EXTRACTION W/ INTRAOCULAR LENS IMPLANT     Bilateral  . ESOPHAGOGASTRODUODENOSCOPY N/A 03/29/2018   Procedure: ESOPHAGOGASTRODUODENOSCOPY (EGD);  Surgeon: Danie Binder, MD;  [facility];  Service: Endoscopy;  Laterality: N/A;  . EYE SURGERY    . INSERT / REPLACE / REMOVE PACEMAKER    . PACEMAKER INSERTION  10-01-2012  . PARATHYROIDECTOMY  10/28/2012  . PAROTIDECTOMY  10/28/2012   Procedure: PAROTIDECTOMY;  Surgeon: Ascencion Dike, MD;  Location: [place];  Service: ENT;  Laterality: Right;  Total Parotidectomy  . PERMANENT PACEMAKER INSERTION N/A 10/01/2012   Procedure: PERMANENT PACEMAKER INSERTION;  Surgeon: Evans Lance, MD;  [facility]: [facility] C[facility] LAB;  Service: Cardiovascular;  Laterality: N/A;     Family History  Problem Relation Age of Onset  . Heart disease Sister   . Heart attack Sister   . Cirrhosis Mother   . Cancer Brother        brain cancer  . Cancer Sister  stomach cancer     Social History   Socioeconomic History  . Marital status: Widowed    Spouse name: Not on file  . Number of children: Not on file  . Years of education: Not on file  . Highest education level: Not on file  Occupational History  . Not on file  Social Needs  . Financial resource strain: Not on file  . Food insecurity:    Worry: Not on file    Inability: Not on file  . Transportation needs:    Medical: Not on file    Non-medical: Not on file  Tobacco Use  . Smoking status: Former Smoker    Types: Cigarettes    Last attempt to quit: 11/25/1972    Years since quitting: 46.0  . Smokeless tobacco: Never Used  Substance and Sexual Activity  . Alcohol use: Yes    Alcohol/week: 1.0 standard drinks     Types: 1 Cans of beer per week    Comment: daily  . Drug use: No  . Sexual activity: Never  Lifestyle  . Physical activity:    Days per week: Not on file    Minutes per session: Not on file  . Stress: Not on file  Relationships  . Social connections:    Talks on phone: Not on file    Gets together: Not on file    Attends religious service: Not on file    Active member of club or organization: Not on file    Attends meetings of clubs or organizations: Not on file    Relationship status: Not on file  . Intimate partner violence:    Fear of current or ex partner: Not on file    Emotionally abused: Not on file    Physically abused: Not on file    Forced sexual activity: Not on file  Other Topics Concern  . Not on file  Social History Narrative  . Not on file     BP (!) 116/56   Pulse 63   Ht 5\' 4"  (1.626 m)   Wt 130 lb 9.6 oz (59.2 kg)   SpO2 99%   BMI 22.42 kg/m   Physical Exam:  Well appearing elderly man, NAD HEENT: Unremarkable Neck:  6 cm JVD, no thyromegally Lymphatics:  No adenopathy Back:  No CVA tenderness Lungs:  Clear with no wheezes HEART:  Regular rate rhythm, no murmurs, no rubs, no clicks Abd:  soft, positive bowel sounds, no organomegally, no rebound, no guarding Ext:  2 plus pulses, no edema, no cyanosis, no clubbing Skin:  No rashes no nodules Neuro:  CN II through XII intact, motor grossly intact   DEVICE  Normal device function.  See PaceArt for details.   Assess/Plan: 1. Sinus node dysfunction - he is asymptomatic, s/p PPM insertion.  2. Atrial fib - his rates are controlled. He is asymptomatic. 3. Anemia - we discussed continuing his anti-coagulation. I think we will continue xarelto for now. If he starts falling then we would need to reconsider. 4. PPM - his St. Jude PPM is programmed VVIR. No change for now.  Mikle Bosworth.D.

## 2018-12-09 NOTE — Patient Instructions (Signed)
Medication Instructions:  Your physician recommends that you continue on your current medications as directed. Please refer to the Current Medication list given to you today.  If you need a refill on your cardiac medications before your next appointment, please call your pharmacy.   Lab work: NONE  If you have labs (blood work) drawn today and your tests are completely normal, you will receive your results only by: . MyChart Message (if you have MyChart) OR . A paper copy in the mail If you have any lab test that is abnormal or we need to change your treatment, we will call you to review the results.  Testing/Procedures: NONE   Follow-Up: At [facility], you and your health needs are our priority.  As part of our continuing mission to provide you with exceptional heart care, we have created designated Provider Care Teams.  These Care Teams include your primary Cardiologist (physician) and Advanced Practice Providers (APPs -  Physician Assistants and Nurse Practitioners) who all work together to provide you with the care you need, when you need it. You will need a follow up appointment in 1 years.  Please call our office 2 months in advance to schedule this appointment.  You may see Dr. Taylor or one of the following Advanced Practice Providers on your designated Care Team:   Amber Seiler, NP . Renee Ursuy, PA-C  Any Other Special Instructions Will Be Listed Below (If Applicable). Thank you for choosing [facility]!     

## 2018-12-16 ENCOUNTER — Encounter (HOSPITAL_COMMUNITY): Payer: Self-pay

## 2018-12-16 ENCOUNTER — Encounter (HOSPITAL_COMMUNITY)
Admission: RE | Admit: 2018-12-16 | Discharge: 2018-12-16 | Disposition: A | Discharge status: Home / Self Care | Payer: PPO | Source: Ambulatory Visit | Attending: Internal Medicine | Admitting: Internal Medicine

## 2018-12-16 DIAGNOSIS — D649 Anemia, unspecified: Secondary | ICD-10-CM | POA: Diagnosis not present

## 2018-12-16 MED ORDER — SODIUM CHLORIDE 0.9 % IV SOLN
Freq: Once | INTRAVENOUS | Status: AC
  Administered 2018-12-16: 10:00:00 via INTRAVENOUS

## 2018-12-16 MED ORDER — SODIUM CHLORIDE 0.9 % IV SOLN
510.0000 mg | Freq: Once | INTRAVENOUS | Status: AC
  Administered 2018-12-16: 510 mg via INTRAVENOUS
  Filled 2018-12-16: qty 510

## 2019-01-21 ENCOUNTER — Ambulatory Visit (INDEPENDENT_AMBULATORY_CARE_PROVIDER_SITE_OTHER): Payer: PPO | Admitting: *Deleted

## 2019-01-21 DIAGNOSIS — I495 Sick sinus syndrome: Secondary | ICD-10-CM

## 2019-01-22 LAB — CUP PACEART REMOTE DEVICE CHECK
Implantable Lead Implant Date: 20131107
Implantable Lead Location: 753860
Implantable Pulse Generator Implant Date: 20131107
Lead Channel Impedance Value: 410 Ohm
Lead Channel Pacing Threshold Amplitude: 0.75 V
Lead Channel Pacing Threshold Pulse Width: 0.4 ms
Lead Channel Sensing Intrinsic Amplitude: 12 mV
MDC IDC LEAD IMPLANT DT: 20131107
MDC IDC LEAD LOCATION: 753859
MDC IDC MSMT BATTERY REMAINING LONGEVITY: 66 mo
MDC IDC MSMT BATTERY REMAINING PERCENTAGE: 46 %
MDC IDC MSMT BATTERY VOLTAGE: 2.86 V
MDC IDC SESS DTM: 20200227070009
MDC IDC SET LEADCHNL RV PACING AMPLITUDE: 1 V
MDC IDC SET LEADCHNL RV PACING PULSEWIDTH: 0.4 ms
MDC IDC SET LEADCHNL RV SENSING SENSITIVITY: 4 mV
MDC IDC STAT BRADY RV PERCENT PACED: 91 %
Pulse Gen Model: 2210
Pulse Gen Serial Number: 7411696

## 2019-01-27 ENCOUNTER — Encounter: Payer: Self-pay | Admitting: Cardiology

## 2019-01-27 NOTE — Progress Notes (Signed)
Remote pacemaker transmission.   

## 2019-04-22 ENCOUNTER — Ambulatory Visit (INDEPENDENT_AMBULATORY_CARE_PROVIDER_SITE_OTHER): Payer: PPO | Admitting: *Deleted

## 2019-04-22 DIAGNOSIS — I495 Sick sinus syndrome: Secondary | ICD-10-CM

## 2019-04-22 LAB — CUP PACEART REMOTE DEVICE CHECK
Battery Remaining Longevity: 60 mo
Battery Remaining Percentage: 40 %
Battery Voltage: 2.84 V
Brady Statistic RV Percent Paced: 91 %
Date Time Interrogation Session: 20200528151430
Implantable Lead Implant Date: 20131107
Implantable Lead Implant Date: 20131107
Implantable Lead Location: 753859
Implantable Lead Location: 753860
Implantable Pulse Generator Implant Date: 20131107
Lead Channel Impedance Value: 390 Ohm
Lead Channel Pacing Threshold Amplitude: 0.625 V
Lead Channel Pacing Threshold Pulse Width: 0.4 ms
Lead Channel Sensing Intrinsic Amplitude: 12 mV
Lead Channel Setting Pacing Amplitude: 1 V
Lead Channel Setting Pacing Pulse Width: 0.4 ms
Lead Channel Setting Sensing Sensitivity: 4 mV
Pulse Gen Model: 2210
Pulse Gen Serial Number: 7411696

## 2019-04-29 ENCOUNTER — Encounter: Payer: Self-pay | Admitting: Cardiology

## 2019-04-29 NOTE — Progress Notes (Signed)
Remote pacemaker transmission.   

## 2019-05-06 DIAGNOSIS — E039 Hypothyroidism, unspecified: Secondary | ICD-10-CM | POA: Diagnosis not present

## 2019-05-06 DIAGNOSIS — Z1331 Encounter for screening for depression: Secondary | ICD-10-CM | POA: Diagnosis not present

## 2019-05-06 DIAGNOSIS — Z1389 Encounter for screening for other disorder: Secondary | ICD-10-CM | POA: Diagnosis not present

## 2019-05-06 DIAGNOSIS — I4821 Permanent atrial fibrillation: Secondary | ICD-10-CM | POA: Diagnosis not present

## 2019-05-06 DIAGNOSIS — I10 Essential (primary) hypertension: Secondary | ICD-10-CM | POA: Diagnosis not present

## 2019-05-06 DIAGNOSIS — Z0001 Encounter for general adult medical examination with abnormal findings: Secondary | ICD-10-CM | POA: Diagnosis not present

## 2019-05-07 DIAGNOSIS — I482 Chronic atrial fibrillation, unspecified: Secondary | ICD-10-CM | POA: Diagnosis not present

## 2019-05-07 DIAGNOSIS — I779 Disorder of arteries and arterioles, unspecified: Secondary | ICD-10-CM | POA: Diagnosis not present

## 2019-05-07 DIAGNOSIS — I10 Essential (primary) hypertension: Secondary | ICD-10-CM | POA: Diagnosis not present

## 2019-05-07 DIAGNOSIS — E039 Hypothyroidism, unspecified: Secondary | ICD-10-CM | POA: Diagnosis not present

## 2019-06-05 DIAGNOSIS — E039 Hypothyroidism, unspecified: Secondary | ICD-10-CM | POA: Diagnosis not present

## 2019-06-05 DIAGNOSIS — I10 Essential (primary) hypertension: Secondary | ICD-10-CM | POA: Diagnosis not present

## 2019-07-06 DIAGNOSIS — E039 Hypothyroidism, unspecified: Secondary | ICD-10-CM | POA: Diagnosis not present

## 2019-07-06 DIAGNOSIS — I10 Essential (primary) hypertension: Secondary | ICD-10-CM | POA: Diagnosis not present

## 2019-07-20 ENCOUNTER — Other Ambulatory Visit: Payer: Self-pay | Admitting: Internal Medicine

## 2019-07-20 NOTE — Telephone Encounter (Signed)
Prescription refill request for Xarelto received.   Last office visit: Fernando [name] (12-09-2018) Weight: 59.2 kg (12-09-2018) Age: 85 y.o. Scr: 1.22 (05-07-2019) from PCP CrCl: 34 ml/min  Prescription refill sent.

## 2019-07-22 ENCOUNTER — Ambulatory Visit (INDEPENDENT_AMBULATORY_CARE_PROVIDER_SITE_OTHER): Payer: PPO | Admitting: *Deleted

## 2019-07-22 DIAGNOSIS — I495 Sick sinus syndrome: Secondary | ICD-10-CM

## 2019-07-22 DIAGNOSIS — I4891 Unspecified atrial fibrillation: Secondary | ICD-10-CM

## 2019-07-22 LAB — CUP PACEART REMOTE DEVICE CHECK
Battery Remaining Longevity: 58 mo
Battery Remaining Percentage: 40 %
Battery Voltage: 2.84 V
Brady Statistic RV Percent Paced: 90 %
Date Time Interrogation Session: 20200827075154
Implantable Lead Implant Date: 20131107
Implantable Lead Implant Date: 20131107
Implantable Lead Location: 753859
Implantable Lead Location: 753860
Implantable Pulse Generator Implant Date: 20131107
Lead Channel Impedance Value: 380 Ohm
Lead Channel Pacing Threshold Amplitude: 0.625 V
Lead Channel Pacing Threshold Pulse Width: 0.4 ms
Lead Channel Sensing Intrinsic Amplitude: 12 mV
Lead Channel Setting Pacing Amplitude: 0.875
Lead Channel Setting Pacing Pulse Width: 0.4 ms
Lead Channel Setting Sensing Sensitivity: 4 mV
Pulse Gen Model: 2210
Pulse Gen Serial Number: 7411696

## 2019-07-29 NOTE — Progress Notes (Signed)
Remote pacemaker transmission.   

## 2019-08-06 DIAGNOSIS — E039 Hypothyroidism, unspecified: Secondary | ICD-10-CM | POA: Diagnosis not present

## 2019-08-06 DIAGNOSIS — I10 Essential (primary) hypertension: Secondary | ICD-10-CM | POA: Diagnosis not present

## 2019-09-04 DIAGNOSIS — I10 Essential (primary) hypertension: Secondary | ICD-10-CM | POA: Diagnosis not present

## 2019-09-04 DIAGNOSIS — E039 Hypothyroidism, unspecified: Secondary | ICD-10-CM | POA: Diagnosis not present

## 2019-10-06 DIAGNOSIS — E039 Hypothyroidism, unspecified: Secondary | ICD-10-CM | POA: Diagnosis not present

## 2019-10-06 DIAGNOSIS — I10 Essential (primary) hypertension: Secondary | ICD-10-CM | POA: Diagnosis not present

## 2019-10-07 DIAGNOSIS — I10 Essential (primary) hypertension: Secondary | ICD-10-CM | POA: Diagnosis not present

## 2019-10-07 DIAGNOSIS — I4821 Permanent atrial fibrillation: Secondary | ICD-10-CM | POA: Diagnosis not present

## 2019-10-07 DIAGNOSIS — R531 Weakness: Secondary | ICD-10-CM | POA: Diagnosis not present

## 2019-10-11 ENCOUNTER — Other Ambulatory Visit (HOSPITAL_COMMUNITY): Payer: Self-pay | Admitting: Internal Medicine

## 2019-10-11 ENCOUNTER — Other Ambulatory Visit: Payer: Self-pay | Admitting: Internal Medicine

## 2019-10-11 DIAGNOSIS — R55 Syncope and collapse: Secondary | ICD-10-CM

## 2019-10-18 ENCOUNTER — Other Ambulatory Visit: Payer: Self-pay | Admitting: Internal Medicine

## 2019-10-18 NOTE — Telephone Encounter (Signed)
Age 85, weight 59.2kg, SCr 1.22 on 05/07/19, CrCl 33.53mL/min, pt on appropriate dose of Xarelto. Last OV January 2020, afib indication

## 2019-10-22 LAB — CUP PACEART REMOTE DEVICE CHECK
Battery Remaining Longevity: 50 mo
Battery Remaining Percentage: 35 %
Battery Voltage: 2.83 V
Brady Statistic RV Percent Paced: 90 %
Date Time Interrogation Session: 20201126032258
Implantable Lead Implant Date: 20131107
Implantable Lead Implant Date: 20131107
Implantable Lead Location: 753859
Implantable Lead Location: 753860
Implantable Pulse Generator Implant Date: 20131107
Lead Channel Impedance Value: 390 Ohm
Lead Channel Pacing Threshold Amplitude: 0.625 V
Lead Channel Pacing Threshold Pulse Width: 0.4 ms
Lead Channel Sensing Intrinsic Amplitude: 12 mV
Lead Channel Setting Pacing Amplitude: 0.875
Lead Channel Setting Pacing Pulse Width: 0.4 ms
Lead Channel Setting Sensing Sensitivity: 4 mV
Pulse Gen Model: 2210
Pulse Gen Serial Number: 7411696

## 2019-10-25 ENCOUNTER — Ambulatory Visit (INDEPENDENT_AMBULATORY_CARE_PROVIDER_SITE_OTHER): Payer: PPO | Admitting: *Deleted

## 2019-10-25 DIAGNOSIS — I495 Sick sinus syndrome: Secondary | ICD-10-CM

## 2019-10-28 ENCOUNTER — Ambulatory Visit (HOSPITAL_COMMUNITY): Payer: PPO

## 2019-10-28 ENCOUNTER — Encounter (HOSPITAL_COMMUNITY): Payer: Self-pay

## 2019-11-06 DIAGNOSIS — I10 Essential (primary) hypertension: Secondary | ICD-10-CM | POA: Diagnosis not present

## 2019-11-06 DIAGNOSIS — E039 Hypothyroidism, unspecified: Secondary | ICD-10-CM | POA: Diagnosis not present

## 2019-11-17 NOTE — Progress Notes (Signed)
PPM remote 

## 2019-12-07 DIAGNOSIS — I10 Essential (primary) hypertension: Secondary | ICD-10-CM | POA: Diagnosis not present

## 2019-12-07 DIAGNOSIS — E039 Hypothyroidism, unspecified: Secondary | ICD-10-CM | POA: Diagnosis not present

## 2019-12-22 ENCOUNTER — Ambulatory Visit: Payer: PPO | Admitting: Internal Medicine

## 2019-12-22 ENCOUNTER — Encounter: Payer: Self-pay | Admitting: Internal Medicine

## 2019-12-22 ENCOUNTER — Other Ambulatory Visit: Payer: Self-pay

## 2019-12-22 VITALS — BP 142/71 | HR 62 | Ht 64.0 in | Wt 135.0 lb

## 2019-12-22 DIAGNOSIS — Z950 Presence of cardiac pacemaker: Secondary | ICD-10-CM

## 2019-12-22 NOTE — Patient Instructions (Signed)
Medication Instructions:  Your physician recommends that you continue on your current medications as directed. Please refer to the Current Medication list given to you today.  *If you need a refill on your cardiac medications before your next appointment, please call your pharmacy*  Lab Work: NONE  If you have labs (blood work) drawn today and your tests are completely normal, you will receive your results only by: . MyChart Message (if you have MyChart) OR . A paper copy in the mail If you have any lab test that is abnormal or we need to change your treatment, we will call you to review the results.  Testing/Procedures: NONE   Follow-Up: At [facility], you and your health needs are our priority.  As part of our continuing mission to provide you with exceptional heart care, we have created designated Provider Care Teams.  These Care Teams include your primary Cardiologist (physician) and Advanced Practice Providers (APPs -  Physician Assistants and Nurse Practitioners) who all work together to provide you with the care you need, when you need it.  Your next appointment:   1 year(s)  The format for your next appointment:   In Person  Provider:   Gregg Taylor, MD  Other Instructions Thank you for choosing [facility]!    

## 2019-12-22 NOTE — Progress Notes (Signed)
HPI Fernando [name] returns today for followup of atrial fib, on Xarelto, sinus node dysfunction, s/p PPM, chronic diastolic heart failure andHTN. He was found to be anemic earlier and was transfused. GI workup was reported as negative. He has had some falls but has not injured himself. No chest pain. He has gotten a DUI in the interim. In addition, he has had a mini stroke for which he recovered completely. The patient admits to at times missing a dose of xarelto.  No Known Allergies   Current Outpatient Medications  Medication Sig Dispense Refill  . ARIPiprazole (ABILIFY) 20 MG tablet Take 20 mg by mouth daily.    Marland Kitchen atorvastatin (LIPITOR) 40 MG tablet Take 40 mg by mouth every morning.     Marland Kitchen levothyroxine (SYNTHROID) 50 MCG tablet Take 1 tablet (50 mcg total) by mouth daily before breakfast. 30 tablet 3  . losartan (COZAAR) 50 MG tablet Take 50 mg by mouth every morning.     . metoprolol tartrate (LOPRESSOR) 25 MG tablet TAKE 1 TABLET TWICE A DAY 180 tablet 0  . pantoprazole (PROTONIX) 40 MG tablet Take 1 tablet (40 mg total) by mouth 2 (two) times daily before a meal. Twice a day for 3 months and then start taking daily. 60 tablet 3  . potassium citrate (UROCIT-K) 10 MEQ (1080 MG) SR tablet Take 10 mEq by mouth 3 (three) times daily with meals.    Alveda Reasons 15 MG TABS tablet TAKE 1 TABLET BY MOUTH ONCE DAILY WITH SUPPER 90 tablet 1   No current facility-administered medications for this visit.     Past Medical History:  Diagnosis Date  . Arthritis   . Atrial fibrillation ([place])   . Carotid artery disease ([place])    Dr. Kellie Simmering - s/p left CEA  . Coronary artery disease   . Essential hypertension, benign   . Hyperlipidemia   . 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 This mass was initially felt to be 1.7 cm at the time of presentation but had  enlarged significantly to 4 cm clinically at the time of surgery. Pathologically it was 3.4 cm. A lymph node deep to the parotid was enlarged clinically at the time of surgery but was negative   . Tachycardia-bradycardia syndrome ([place])     ROS:   All systems reviewed and negative except as noted in the HPI.   Past Surgical History:  Procedure Laterality Date  . CAROTID ENDARTERECTOMY Left Jan. 3, 2009   CE  . CATARACT EXTRACTION W/ INTRAOCULAR LENS IMPLANT     Bilateral  . ESOPHAGOGASTRODUODENOSCOPY N/A 03/29/2018   Procedure: ESOPHAGOGASTRODUODENOSCOPY (EGD);  Surgeon: Danie Binder, MD;  [facility];  Service: Endoscopy;  Laterality: N/A;  . EYE SURGERY    . INSERT / REPLACE / REMOVE PACEMAKER    . PACEMAKER INSERTION  10-01-2012  . PARATHYROIDECTOMY  10/28/2012  . PAROTIDECTOMY  10/28/2012   Procedure: PAROTIDECTOMY;  Surgeon: Ascencion Dike, MD;  Location: [place];  Service: ENT;  Laterality: Right;  Total Parotidectomy  . PERMANENT PACEMAKER INSERTION N/A 10/01/2012   Procedure: PERMANENT PACEMAKER INSERTION;  Surgeon: Evans Lance, MD;  Location: [facility] CATH LAB;  Service: Cardiovascular;  Laterality: N/A;     Family History  Problem Relation Age of Onset  . Heart disease Sister   . Heart attack Sister   .  Cirrhosis Mother   . Cancer Brother        brain cancer  . Cancer Sister        stomach cancer     Social History   Socioeconomic History  . Marital status: Widowed    Spouse name: Not on file  . Number of children: Not on file  . Years of education: Not on file  . Highest education level: Not on file  Occupational History  . Not on file  Tobacco Use  . Smoking status: Former Smoker    Types: Cigarettes    Quit date: 11/25/1972    Years since quitting: 47.1  . Smokeless tobacco: Never Used  Substance and Sexual Activity  . Alcohol use: Yes    Alcohol/week: 1.0 standard drinks    Types: 1 Cans of beer per week    Comment: daily  . Drug use: No  .  Sexual activity: Never  Other Topics Concern  . Not on file  Social History Narrative  . Not on file   Social Determinants of Health   Financial Resource Strain:   . Difficulty of Paying Living Expenses: Not on file  Food Insecurity:   . Worried About Charity fundraiser in the Last Year: Not on file  . Ran Out of Food in the Last Year: Not on file  Transportation Needs:   . Lack of Transportation (Medical): Not on file  . Lack of Transportation (Non-Medical): Not on file  Physical Activity:   . Days of Exercise per Week: Not on file  . Minutes of Exercise per Session: Not on file  Stress:   . Feeling of Stress : Not on file  Social Connections:   . Frequency of Communication with Friends and Family: Not on file  . Frequency of Social Gatherings with Friends and Family: Not on file  . Attends Religious Services: Not on file  . Active Member of Clubs or Organizations: Not on file  . Attends Archivist Meetings: Not on file  . Marital Status: Not on file  Intimate Partner Violence:   . Fear of Current or Ex-Partner: Not on file  . Emotionally Abused: Not on file  . Physically Abused: Not on file  . Sexually Abused: Not on file     BP (!) 142/71   Pulse 62   Ht 5\' 4"  (1.626 m)   Wt 135 lb (61.2 kg)   SpO2 99%   BMI 23.17 kg/m   Physical Exam:  Well appearing NAD HEENT: Unremarkable Neck:  No JVD, no thyromegally Lymphatics:  No adenopathy Back:  No CVA tenderness Lungs:  Clear with no wheezes HEART:  Regular rate rhythm, no murmurs, no rubs, no clicks Abd:  soft, positive bowel sounds, no organomegally, no rebound, no guarding Ext:  2 plus pulses, no edema, no cyanosis, no clubbing Skin:  No rashes no nodules Neuro:  CN II through XII intact, motor grossly intact  DEVICE  Normal device function.  See PaceArt for details.   Assess/Plan: 1. Diastolic heart failure - his symptoms are class 2. He will continue his current meds. I encouraged him to stay  active and avoid salty foods. 2. Atrial fib- he is asymptomatic and remains on xarelto. 3. HTN - his bp is fairly well controlled. He will continue his current meds as I am not inclined to drive his bp too low. 4. PPM - his [employer] PPM is programmed VVI and is working normally.   Carleene Overlie  Leonela Kivi,M.D.

## 2020-01-07 DIAGNOSIS — I10 Essential (primary) hypertension: Secondary | ICD-10-CM | POA: Diagnosis not present

## 2020-01-07 DIAGNOSIS — E039 Hypothyroidism, unspecified: Secondary | ICD-10-CM | POA: Diagnosis not present

## 2020-01-24 ENCOUNTER — Ambulatory Visit (INDEPENDENT_AMBULATORY_CARE_PROVIDER_SITE_OTHER): Payer: PPO | Admitting: *Deleted

## 2020-01-24 DIAGNOSIS — I495 Sick sinus syndrome: Secondary | ICD-10-CM

## 2020-01-24 LAB — CUP PACEART REMOTE DEVICE CHECK
Battery Remaining Longevity: 41 mo
Battery Remaining Percentage: 29 %
Battery Voltage: 2.81 V
Brady Statistic RV Percent Paced: 97 %
Date Time Interrogation Session: 20210301035334
Implantable Lead Implant Date: 20131107
Implantable Lead Implant Date: 20131107
Implantable Lead Location: 753859
Implantable Lead Location: 753860
Implantable Pulse Generator Implant Date: 20131107
Lead Channel Impedance Value: 380 Ohm
Lead Channel Pacing Threshold Amplitude: 0.75 V
Lead Channel Pacing Threshold Pulse Width: 0.4 ms
Lead Channel Sensing Intrinsic Amplitude: 11.9 mV
Lead Channel Setting Pacing Amplitude: 1 V
Lead Channel Setting Pacing Pulse Width: 0.4 ms
Lead Channel Setting Sensing Sensitivity: 4 mV
Pulse Gen Model: 2210
Pulse Gen Serial Number: 7411696

## 2020-01-25 NOTE — Progress Notes (Signed)
PPM Remote  

## 2020-02-04 DIAGNOSIS — E039 Hypothyroidism, unspecified: Secondary | ICD-10-CM | POA: Diagnosis not present

## 2020-02-04 DIAGNOSIS — I10 Essential (primary) hypertension: Secondary | ICD-10-CM | POA: Diagnosis not present

## 2020-03-06 DIAGNOSIS — I10 Essential (primary) hypertension: Secondary | ICD-10-CM | POA: Diagnosis not present

## 2020-03-06 DIAGNOSIS — E039 Hypothyroidism, unspecified: Secondary | ICD-10-CM | POA: Diagnosis not present

## 2020-04-12 DIAGNOSIS — Z0001 Encounter for general adult medical examination with abnormal findings: Secondary | ICD-10-CM | POA: Diagnosis not present

## 2020-04-12 DIAGNOSIS — Z1331 Encounter for screening for depression: Secondary | ICD-10-CM | POA: Diagnosis not present

## 2020-04-12 DIAGNOSIS — E039 Hypothyroidism, unspecified: Secondary | ICD-10-CM | POA: Diagnosis not present

## 2020-04-12 DIAGNOSIS — I10 Essential (primary) hypertension: Secondary | ICD-10-CM | POA: Diagnosis not present

## 2020-04-12 DIAGNOSIS — Z1389 Encounter for screening for other disorder: Secondary | ICD-10-CM | POA: Diagnosis not present

## 2020-04-12 DIAGNOSIS — I4821 Permanent atrial fibrillation: Secondary | ICD-10-CM | POA: Diagnosis not present

## 2020-04-25 ENCOUNTER — Other Ambulatory Visit: Payer: Self-pay | Admitting: Internal Medicine

## 2020-04-26 ENCOUNTER — Ambulatory Visit (INDEPENDENT_AMBULATORY_CARE_PROVIDER_SITE_OTHER): Payer: PPO | Admitting: *Deleted

## 2020-04-26 DIAGNOSIS — I495 Sick sinus syndrome: Secondary | ICD-10-CM

## 2020-04-26 LAB — CUP PACEART REMOTE DEVICE CHECK
Battery Remaining Longevity: 35 mo
Battery Remaining Percentage: 25 %
Battery Voltage: 2.8 V
Brady Statistic RV Percent Paced: 98 %
Date Time Interrogation Session: 20210531032234
Implantable Lead Implant Date: 20131107
Implantable Lead Implant Date: 20131107
Implantable Lead Location: 753859
Implantable Lead Location: 753860
Implantable Pulse Generator Implant Date: 20131107
Lead Channel Impedance Value: 380 Ohm
Lead Channel Pacing Threshold Amplitude: 0.75 V
Lead Channel Pacing Threshold Pulse Width: 0.4 ms
Lead Channel Sensing Intrinsic Amplitude: 12 mV
Lead Channel Setting Pacing Amplitude: 1 V
Lead Channel Setting Pacing Pulse Width: 0.4 ms
Lead Channel Setting Sensing Sensitivity: 4 mV
Pulse Gen Model: 2210
Pulse Gen Serial Number: 7411696

## 2020-05-02 NOTE — Progress Notes (Signed)
Remote pacemaker transmission.   

## 2020-05-08 ENCOUNTER — Other Ambulatory Visit: Payer: Self-pay

## 2020-05-08 ENCOUNTER — Encounter (HOSPITAL_COMMUNITY): Payer: Self-pay | Admitting: *Deleted

## 2020-05-08 ENCOUNTER — Emergency Department (HOSPITAL_COMMUNITY): Payer: PPO

## 2020-05-08 ENCOUNTER — Emergency Department (HOSPITAL_COMMUNITY)
Admission: EM | Admit: 2020-05-08 | Discharge: 2020-05-08 | Disposition: A | Discharge status: Home / Self Care | Payer: PPO | Attending: Emergency Medicine | Admitting: Emergency Medicine

## 2020-05-08 DIAGNOSIS — Z7901 Long term (current) use of anticoagulants: Secondary | ICD-10-CM | POA: Insufficient documentation

## 2020-05-08 DIAGNOSIS — R1032 Left lower quadrant pain: Secondary | ICD-10-CM | POA: Diagnosis not present

## 2020-05-08 DIAGNOSIS — Z950 Presence of cardiac pacemaker: Secondary | ICD-10-CM | POA: Diagnosis not present

## 2020-05-08 DIAGNOSIS — R110 Nausea: Secondary | ICD-10-CM | POA: Diagnosis not present

## 2020-05-08 DIAGNOSIS — R1110 Vomiting, unspecified: Secondary | ICD-10-CM | POA: Insufficient documentation

## 2020-05-08 DIAGNOSIS — I2510 Atherosclerotic heart disease of native coronary artery without angina pectoris: Secondary | ICD-10-CM | POA: Insufficient documentation

## 2020-05-08 DIAGNOSIS — R1030 Lower abdominal pain, unspecified: Secondary | ICD-10-CM | POA: Diagnosis not present

## 2020-05-08 DIAGNOSIS — I4891 Unspecified atrial fibrillation: Secondary | ICD-10-CM | POA: Insufficient documentation

## 2020-05-08 DIAGNOSIS — Z87891 Personal history of nicotine dependence: Secondary | ICD-10-CM | POA: Insufficient documentation

## 2020-05-08 DIAGNOSIS — I10 Essential (primary) hypertension: Secondary | ICD-10-CM | POA: Insufficient documentation

## 2020-05-08 DIAGNOSIS — R112 Nausea with vomiting, unspecified: Secondary | ICD-10-CM | POA: Diagnosis not present

## 2020-05-08 DIAGNOSIS — Z85858 Personal history of malignant neoplasm of other endocrine glands: Secondary | ICD-10-CM | POA: Diagnosis not present

## 2020-05-08 LAB — CBC
HCT: 35.7 % — ABNORMAL LOW (ref 39.0–52.0)
Hemoglobin: 11.2 g/dL — ABNORMAL LOW (ref 13.0–17.0)
MCH: 25.7 pg — ABNORMAL LOW (ref 26.0–34.0)
MCHC: 31.4 g/dL (ref 30.0–36.0)
MCV: 81.9 fL (ref 80.0–100.0)
Platelets: 228 10*3/uL (ref 150–400)
RBC: 4.36 MIL/uL (ref 4.22–5.81)
RDW: 17.3 % — ABNORMAL HIGH (ref 11.5–15.5)
WBC: 7.3 10*3/uL (ref 4.0–10.5)
nRBC: 0 % (ref 0.0–0.2)

## 2020-05-08 LAB — COMPREHENSIVE METABOLIC PANEL
ALT: 13 U/L (ref 0–44)
AST: 21 U/L (ref 15–41)
Albumin: 4.2 g/dL (ref 3.5–5.0)
Alkaline Phosphatase: 55 U/L (ref 38–126)
Anion gap: 10 (ref 5–15)
BUN: 34 mg/dL — ABNORMAL HIGH (ref 8–23)
CO2: 23 mmol/L (ref 22–32)
Calcium: 9.1 mg/dL (ref 8.9–10.3)
Chloride: 99 mmol/L (ref 98–111)
Creatinine, Ser: 1.35 mg/dL — ABNORMAL HIGH (ref 0.61–1.24)
GFR calc Af Amer: 53 mL/min — ABNORMAL LOW (ref >60)
GFR calc non Af Amer: 46 mL/min — ABNORMAL LOW (ref >60)
Glucose, Bld: 156 mg/dL — ABNORMAL HIGH (ref 70–99)
Potassium: 4.6 mmol/L (ref 3.5–5.1)
Sodium: 132 mmol/L — ABNORMAL LOW (ref 135–145)
Total Bilirubin: 0.9 mg/dL (ref 0.3–1.2)
Total Protein: 8 g/dL (ref 6.5–8.1)

## 2020-05-08 LAB — LIPASE, BLOOD: Lipase: 27 U/L (ref 11–51)

## 2020-05-08 MED ORDER — IOHEXOL 300 MG/ML  SOLN
75.0000 mL | Freq: Once | INTRAMUSCULAR | Status: AC | PRN
  Administered 2020-05-08: 75 mL via INTRAVENOUS

## 2020-05-08 MED ORDER — SODIUM CHLORIDE 0.9% FLUSH: 3.0000 mL | Freq: Once | INTRAVENOUS | Status: DC

## 2020-05-08 MED ORDER — ONDANSETRON 4 MG PO TBDP: ORAL_TABLET | ORAL | 0 refills | Status: DC

## 2020-05-08 NOTE — ED Triage Notes (Signed)
Family member state he has had abdominal pain, nausea and vomiting onset this am.

## 2020-05-08 NOTE — Discharge Instructions (Addendum)
Drink only liquids today and tomorrow.  Return to the emergency department if vomiting or significant discomfort.  Otherwise she can follow-up with your family doctor

## 2020-05-08 NOTE — ED Notes (Signed)
Patient transported to CT 

## 2020-05-08 NOTE — ED Provider Notes (Signed)
[facility] EMERGENCY DEPARTMENT Provider Note   CSN: [id number] Arrival date & time: 05/08/20  1434     History Chief Complaint  Patient presents with  . Abdominal Pain    Fernando [name] is a 86 y.o. male.  Patient was brought in for abdominal pain and vomited once.  Patient had minimal discomfort now.  No nausea  The history is provided by the patient and a relative. No language interpreter was used.  Abdominal Pain Pain location:  LLQ Pain quality: aching   Pain radiates to:  Does not radiate Pain severity:  Mild Onset quality:  Sudden Timing:  Intermittent Progression:  Waxing and waning Chronicity:  New Context: not alcohol use   Relieved by:  Nothing Worsened by:  Nothing Ineffective treatments:  None tried Associated symptoms: vomiting   Associated symptoms: no chest pain, no cough, no diarrhea, no fatigue and no hematuria        Past Medical History:  Diagnosis Date  . Arthritis   . Atrial fibrillation ([place])   . Carotid artery disease ([place])    Dr. Kellie Simmering - s/p left CEA  . Coronary artery disease   . Essential hypertension, benign   . Hyperlipidemia   . 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 This mass was initially felt to be 1.7 cm at the time of presentation but had enlarged significantly to 4 cm clinically at the time of surgery. Pathologically it was 3.4 cm. A lymph node deep to the parotid was enlarged clinically at the time of surgery but was negative   . Tachycardia-bradycardia syndrome [facility])     Patient Active Problem List   Diagnosis Date Noted  . GI bleed 03/27/2018  . CAD (coronary artery disease), native coronary artery 03/27/2018  . Acute blood loss anemia 03/27/2018  . HLD (hyperlipidemia) 03/27/2018  . Depression 03/27/2018  . Esophageal dysphagia   . History of radiation therapy   .  Symptomatic anemia   . Weakness   . Carotid stenosis 09/27/2014  . Aftercare following surgery of the circulatory system 09/27/2014  . 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 Pacemaker-St.Jude 10/02/2012  . Atrial fibrillation ([place]) 09/09/2012  . Essential hypertension, benign 09/09/2012  . Tachy-brady syndrome ([place]) 09/09/2012  . Occlusion and stenosis of carotid artery without mention of cerebral infarction 03/19/2012    Past Surgical History:  Procedure Laterality Date  . CAROTID ENDARTERECTOMY Left Jan. 3, 2009   CE  . CATARACT EXTRACTION W/ INTRAOCULAR LENS IMPLANT     Bilateral  . ESOPHAGOGASTRODUODENOSCOPY N/A 03/29/2018   Procedure: ESOPHAGOGASTRODUODENOSCOPY (EGD);  Surgeon: Danie Binder, MD;  [facility];  Service: Endoscopy;  Laterality: N/A;  . EYE SURGERY    . INSERT / REPLACE / REMOVE PACEMAKER    . PACEMAKER INSERTION  10-01-2012  . PARATHYROIDECTOMY  10/28/2012  . PAROTIDECTOMY  10/28/2012   Procedure: PAROTIDECTOMY;  Surgeon: Ascencion Dike, MD;  Location: [place];  Service: ENT;  Laterality: Right;  Total Parotidectomy  . PERMANENT PACEMAKER INSERTION N/A 10/01/2012   Procedure: PERMANENT PACEMAKER INSERTION;  Surgeon: Evans Lance, MD;  Location: [facility] CATH LAB;  Service: Cardiovascular;  Laterality: N/A;       Family History  Problem Relation Age of Onset  . Heart disease Sister   . Heart attack Sister   . Cirrhosis Mother   . Cancer Brother  brain cancer  . Cancer Sister        stomach cancer    Social History   Tobacco Use  . Smoking status: Former Smoker    Types: Cigarettes    Quit date: 11/25/1972    Years since quitting: 47.4  . Smokeless tobacco: Never Used  Vaping Use  . Vaping Use: Never used  Substance Use Topics  . Alcohol use: Yes    Alcohol/week: 1.0 standard drink    Types: 1 Cans of beer per week    Comment: daily  . Drug use: No    Home Medications Prior to Admission medications   Medication Sig  Start Date End Date Taking? Authorizing Provider  atorvastatin (LIPITOR) 40 MG tablet Take 40 mg by mouth every morning.    Yes [provider]  levothyroxine (SYNTHROID) 50 MCG tablet Take 1 tablet (50 mcg total) by mouth daily before breakfast. 12/26/15  Yes Penland, Kelby Fam, MD  losartan (COZAAR) 50 MG tablet Take 50 mg by mouth every morning.    Yes [provider]  metoprolol tartrate (LOPRESSOR) 25 MG tablet TAKE 1 TABLET TWICE A DAY Patient taking differently: Take 25 mg by mouth 2 (two) times daily.  12/02/13  Yes Evans Lance, MD  pantoprazole (PROTONIX) 40 MG tablet Take 1 tablet (40 mg total) by mouth 2 (two) times daily before a meal. Twice a day for 3 months and then start taking daily. 03/29/18  Yes Barton Dubois, MD  XARELTO 15 MG TABS tablet TAKE 1 TABLET BY MOUTH ONCE DAILY WITH SUPPER Patient taking differently: Take 15 mg by mouth daily with supper.  04/25/20  Yes Evans Lance, MD  ondansetron (ZOFRAN ODT) 4 MG disintegrating tablet 4mg  ODT q4 hours prn nausea/vomit 05/08/20   Milton Ferguson, MD    Allergies    Patient has no known allergies.  Review of Systems   Review of Systems  Constitutional: Negative for appetite change and fatigue.  HENT: Negative for congestion, ear discharge and sinus pressure.   Eyes: Negative for discharge.  Respiratory: Negative for cough.   Cardiovascular: Negative for chest pain.  Gastrointestinal: Positive for abdominal pain and vomiting. Negative for diarrhea.  Genitourinary: Negative for frequency and hematuria.  Musculoskeletal: Negative for back pain.  Skin: Negative for rash.  Neurological: Negative for seizures and headaches.  Psychiatric/Behavioral: Negative for hallucinations.    Physical Exam Updated Vital Signs BP (!) 199/83   Pulse 66   Temp 97.6 F (36.4 C) (Oral)   Resp 17   Ht 5\' 4"  (1.626 m)   Wt 59 kg   SpO2 97%   BMI 22.31 kg/m   Physical Exam Vitals and nursing note reviewed.    Constitutional:      Appearance: He is well-developed.  HENT:     Head: Normocephalic.     Nose: Nose normal.  Eyes:     General: No scleral icterus.    Conjunctiva/sclera: Conjunctivae normal.  Neck:     Thyroid: No thyromegaly.  Cardiovascular:     Rate and Rhythm: Normal rate and regular rhythm.     Heart sounds: No murmur heard.  No friction rub. No gallop.   Pulmonary:     Breath sounds: No stridor. No wheezing or rales.  Chest:     Chest wall: No tenderness.  Abdominal:     General: There is no distension.     Tenderness: There is abdominal tenderness. There is no rebound.  Musculoskeletal:  General: Normal range of motion.     Cervical back: Neck supple.  Lymphadenopathy:     Cervical: No cervical adenopathy.  Skin:    Findings: No erythema or rash.  Neurological:     Mental Status: He is alert and oriented to person, place, and time.     Motor: No abnormal muscle tone.     Coordination: Coordination normal.  Psychiatric:        Behavior: Behavior normal.     ED Results / Procedures / Treatments   Labs (all labs ordered are listed, but only abnormal results are displayed) Labs Reviewed  COMPREHENSIVE METABOLIC PANEL - Abnormal; Notable for the following components:      Result Value   Sodium 132 (*)    Glucose, Bld 156 (*)    BUN 34 (*)    Creatinine, Ser 1.35 (*)    GFR calc non Af Amer 46 (*)    GFR calc Af Amer 53 (*)    All other components within normal limits  CBC - Abnormal; Notable for the following components:   Hemoglobin 11.2 (*)    HCT 35.7 (*)    MCH 25.7 (*)    RDW 17.3 (*)    All other components within normal limits  LIPASE, BLOOD    EKG None  Radiology CT ABDOMEN PELVIS W CONTRAST  Result Date: 05/08/2020 CLINICAL DATA:  Abdomen pain with nausea and vomiting EXAM: CT ABDOMEN AND PELVIS WITH CONTRAST TECHNIQUE: Multidetector CT imaging of the abdomen and pelvis was performed using the standard protocol following bolus  administration of intravenous contrast. CONTRAST:  65mL OMNIPAQUE IOHEXOL 300 MG/ML  SOLN COMPARISON:  PET CT 11/17/2012 FINDINGS: Lower chest: Lung bases demonstrate small right greater than left pleural effusions. Cardiomegaly with biatrial enlargement. Incompletely visualized cardiac pacing leads. Hepatobiliary: Mild contour nodularity. No calcified gallstone or biliary dilatation Pancreas: Unremarkable. No pancreatic ductal dilatation or surrounding inflammatory changes. Spleen: Normal in size without focal abnormality. Adrenals/Urinary Tract: Adrenal glands are normal. Renal cortical atrophy. No hydronephrosis. Subcentimeter hypodensities in the left kidney too small to further characterize. Right retroperitoneal soft tissue nodule measuring 2.1 cm compared with 18 mm previously. Urinary bladder is unremarkable Stomach/Bowel: The stomach is nonenlarged. Mildly distended jejunal loops in the left upper quadrant with gradual transition to nondilated mid to distal ileal loops. No bowel wall thickening. Scattered diverticular disease of the colon without acute inflammatory change. Vascular/Lymphatic: Extensive aortic atherosclerosis. Aneurysm of the distal infrarenal abdominal aorta measuring up to 3.9 cm transverse by 5.3 cm craniocaudad. Negative for retroperitoneal hematoma or stranding. No suspicious adenopathy Reproductive: Slightly heterogeneous prostate. Other: Negative for free air or free fluid. Musculoskeletal: Degenerative changes of the spine. No acute or suspicious osseous abnormality IMPRESSION: 1. Mild fluid distended proximal small bowel loops with gradual transition to nondilated mid to distal ileal loops suggesting possible ileus or partial obstruction. Negative for free air. Negative for bowel wall thickening. 2. Cardiomegaly with small bilateral pleural effusions 3. Suggestion of mild surface nodularity of the liver as may be seen with cirrhosis 4. Nonspecific 2.1 cm soft tissue nodule posterior  to the right kidney, slightly increased in size as compared with previous PET-CT from 2013 at which time lesion did not demonstrate significant metabolic activity 5. 3.9 cm infrarenal abdominal aortic aneurysm. Recommend followup by ultrasound in 2 years. This recommendation follows ACR consensus guidelines: White Paper of the ACR Incidental Findings Committee II on Vascular Findings. J Am Coll Radiol 2013; 10:789-794. Aortic aneurysm NOS (ICD10-I71.9)  Electronically Signed   By: Donavan Foil M.D.   On: 05/08/2020 17:57    Procedures Procedures (including critical care time)  Medications Ordered in ED Medications  sodium chloride flush (NS) 0.9 % injection 3 mL (3 mLs Intravenous Not Given 05/08/20 1614)  iohexol (OMNIPAQUE) 300 MG/ML solution 75 mL (75 mLs Intravenous Contrast Given 05/08/20 1711)    ED Course  I have reviewed the triage vital signs and the nursing notes.  Pertinent labs & imaging results that were available during my care of the patient were reviewed by me and considered in my medical decision making (see chart for details).    MDM Rules/Calculators/A&P                          Patient with mild ileus or early small bowel obstruction.  Symptoms improved.  I spoke with general surgery and it was suggested that we discharged the patient on Zofran and liquids for 24 hours and have the patient return if any problem       This patient presents to the ED for concern of abdominal pain and vomiting, this involves an extensive number of treatment options, and is a complaint that carries with it a high risk of complications and morbidity.  The differential diagnosis includes ileus small bowel obstruction   Lab Tests:   I Ordered, reviewed, and interpreted labs, which included CBC and chemistries.  Patient has mild anemia and mild dehydration with elevated BUN  Medicines ordered:   I ordered medication Zofran for nausea  Imaging Studies ordered:   I ordered imaging  studies which included CT scan of the abdomen and  I independently visualized and interpreted imaging which showed ileus possible small bowel obstruction  Additional history obtained:   Additional history obtained from son  Previous records obtained and reviewed   Consultations Obtained:   I consulted general surgery and discussed lab and imaging findings  Reevaluation:  After the interventions stated above, I reevaluated the patient and found improved  Critical Interventions:  .   Final Clinical Impression(s) / ED Diagnoses Final diagnoses:  Lower abdominal pain    Rx / DC Orders ED Discharge Orders         Ordered    ondansetron (ZOFRAN ODT) 4 MG disintegrating tablet     Discontinue  Reprint     05/08/20 1923           Milton Ferguson, MD 05/08/20 1931

## 2020-05-12 DIAGNOSIS — R7989 Other specified abnormal findings of blood chemistry: Secondary | ICD-10-CM | POA: Diagnosis not present

## 2020-05-12 DIAGNOSIS — K567 Ileus, unspecified: Secondary | ICD-10-CM | POA: Diagnosis not present

## 2020-06-11 DIAGNOSIS — I10 Essential (primary) hypertension: Secondary | ICD-10-CM | POA: Diagnosis not present

## 2020-06-11 DIAGNOSIS — Z950 Presence of cardiac pacemaker: Secondary | ICD-10-CM | POA: Diagnosis not present

## 2020-06-11 DIAGNOSIS — E039 Hypothyroidism, unspecified: Secondary | ICD-10-CM | POA: Diagnosis not present

## 2020-06-15 ENCOUNTER — Telehealth: Payer: Self-pay | Admitting: Internal Medicine

## 2020-06-15 NOTE — Telephone Encounter (Signed)
[facility] tech services informed Fernando [name] that his Father's remote monitor is not able to transmit and he needs a new monitor. [facility] recommended in clinic check.  Offered in clinic appointment 06/16/20 at 1400 , son hesitant to come for appointment in [place] office due to Covid concerns. Reassured that masks are required for everyone entering the building and that as soon as the patient arrives and is checked in he will be brought back to his appointment.

## 2020-06-15 NOTE — Telephone Encounter (Addendum)
Fernando [name] contacted clinic from [place] and requested that patient monitor be brought in at appointment anda [employer] rep will be here to trouble shoot monitor at appointment. Marcello Moores (son) contacted and he will bring the patient's monitor to the appointment tomorrow.

## 2020-06-15 NOTE — Telephone Encounter (Signed)
Pt's Son Marcello Moores, would like to update Dr.Taylor on Pt's device   Please call Marcello Moores [phone]   Thanks renee

## 2020-06-15 NOTE — Telephone Encounter (Signed)
Fernando [name] reports his father has been having episodes where his legs " become paralyzed" and he falls. It was discovered after one episode that the patient had been taking twice as much metoprolol due to multiple RX. Patients BP is now averaging SBP of 117 per the son. His father had another episode and fell. He did not have LOC or hit his head, no CP, no SOB, no dizziness. Attempted to have remote transmission sent but unable to send transmission due to monitor issue. [facility], dispensing # provided for son to call to trouble shoot monitor. Device clinic # provided for Fernando [name] to notify the clinic of results from call with tech services and encouraged him to contact patient's PCP for appointment. ED precautions given.

## 2020-06-16 ENCOUNTER — Other Ambulatory Visit: Payer: Self-pay

## 2020-06-16 ENCOUNTER — Ambulatory Visit (INDEPENDENT_AMBULATORY_CARE_PROVIDER_SITE_OTHER): Payer: PPO | Admitting: Emergency Medicine

## 2020-06-16 DIAGNOSIS — I495 Sick sinus syndrome: Secondary | ICD-10-CM

## 2020-06-16 LAB — CUP PACEART INCLINIC DEVICE CHECK
Battery Remaining Longevity: 34 mo
Battery Voltage: 2.8 V
Brady Statistic RA Percent Paced: 0 %
Brady Statistic RV Percent Paced: 97 %
Date Time Interrogation Session: 20210723135200
Implantable Lead Implant Date: 20131107
Implantable Lead Implant Date: 20131107
Implantable Lead Location: 753859
Implantable Lead Location: 753860
Implantable Pulse Generator Implant Date: 20131107
Lead Channel Impedance Value: 400 Ohm
Lead Channel Pacing Threshold Amplitude: 0.625 V
Lead Channel Pacing Threshold Pulse Width: 0.4 ms
Lead Channel Sensing Intrinsic Amplitude: 0.2 mV
Lead Channel Sensing Intrinsic Amplitude: 12 mV
Lead Channel Setting Pacing Amplitude: 0.875
Lead Channel Setting Pacing Pulse Width: 0.4 ms
Lead Channel Setting Sensing Sensitivity: 4 mV
Pulse Gen Model: 2210
Pulse Gen Serial Number: 7411696

## 2020-06-16 NOTE — Progress Notes (Signed)
Pacemaker check in clinic. Unable to send a remote transmission after fall with paralysis of BLE for a few minutes at home on 06/14/20. Normal device function. Thresholds, sensing, impedances consistent with previous measurements. Device programmed to maximize longevity. No high ventricular rates noted. Device programmed at appropriate safety margins. Histogram distribution appropriate for patient activity level. Device programmed to optimize intrinsic conduction. Estimated longevity 2 yrs 10 months. Patient enrolled in remote follow-up and next remote transmission 07/27/20. Patient education completed.Cell adapter provided for remote monitor and monitor connecting with patient. Encouraged patient and family to schedule patient for f/u with PCP.

## 2020-06-30 ENCOUNTER — Telehealth: Payer: Self-pay | Admitting: Internal Medicine

## 2020-06-30 NOTE — Telephone Encounter (Signed)
Patient is returning call.  °

## 2020-06-30 NOTE — Telephone Encounter (Signed)
LMTCB to [facility] office and ask for Dr. Tanna Furry nurse

## 2020-06-30 NOTE — Telephone Encounter (Signed)
Returned call to Pt's son.  Pt got a DUI in January of this year and son wants a letter stating Pt is unable to go to court.  States Pt has trouble walking and the son is concerned about covid.  Advised that Dr. Lovena Le states he is unable to write this letter.  Pt's son indicates understanding.  Did state "that does not sound like Dr. Lovena Le".

## 2020-06-30 NOTE — Telephone Encounter (Signed)
Pt's Son called requesting a letter from Dr.Taylor to excuse Pt from court for DWI.   Please call Son [phone]   Thanks renee

## 2020-07-02 ENCOUNTER — Emergency Department (HOSPITAL_COMMUNITY): Payer: PPO

## 2020-07-02 ENCOUNTER — Observation Stay (HOSPITAL_COMMUNITY)
Admission: EM | Admit: 2020-07-02 | Discharge: 2020-07-03 | Disposition: A | Discharge status: Home / Self Care | Payer: PPO | Attending: Internal Medicine | Admitting: Internal Medicine

## 2020-07-02 ENCOUNTER — Encounter (HOSPITAL_COMMUNITY): Payer: Self-pay

## 2020-07-02 ENCOUNTER — Other Ambulatory Visit: Payer: Self-pay

## 2020-07-02 DIAGNOSIS — G8192 Hemiplegia, unspecified affecting left dominant side: Secondary | ICD-10-CM | POA: Diagnosis not present

## 2020-07-02 DIAGNOSIS — I2510 Atherosclerotic heart disease of native coronary artery without angina pectoris: Secondary | ICD-10-CM | POA: Diagnosis not present

## 2020-07-02 DIAGNOSIS — I4891 Unspecified atrial fibrillation: Secondary | ICD-10-CM | POA: Insufficient documentation

## 2020-07-02 DIAGNOSIS — Z20822 Contact with and (suspected) exposure to covid-19: Secondary | ICD-10-CM | POA: Diagnosis not present

## 2020-07-02 DIAGNOSIS — E039 Hypothyroidism, unspecified: Secondary | ICD-10-CM | POA: Diagnosis not present

## 2020-07-02 DIAGNOSIS — R29810 Facial weakness: Secondary | ICD-10-CM | POA: Diagnosis not present

## 2020-07-02 DIAGNOSIS — Z950 Presence of cardiac pacemaker: Secondary | ICD-10-CM | POA: Diagnosis not present

## 2020-07-02 DIAGNOSIS — I69952 Hemiplegia and hemiparesis following unspecified cerebrovascular disease affecting left dominant side: Secondary | ICD-10-CM | POA: Diagnosis not present

## 2020-07-02 DIAGNOSIS — R4781 Slurred speech: Secondary | ICD-10-CM | POA: Diagnosis not present

## 2020-07-02 DIAGNOSIS — Z87891 Personal history of nicotine dependence: Secondary | ICD-10-CM | POA: Diagnosis not present

## 2020-07-02 DIAGNOSIS — E785 Hyperlipidemia, unspecified: Secondary | ICD-10-CM | POA: Diagnosis not present

## 2020-07-02 DIAGNOSIS — R410 Disorientation, unspecified: Secondary | ICD-10-CM | POA: Diagnosis not present

## 2020-07-02 DIAGNOSIS — R404 Transient alteration of awareness: Secondary | ICD-10-CM | POA: Diagnosis not present

## 2020-07-02 DIAGNOSIS — R29818 Other symptoms and signs involving the nervous system: Secondary | ICD-10-CM | POA: Diagnosis not present

## 2020-07-02 DIAGNOSIS — I6521 Occlusion and stenosis of right carotid artery: Secondary | ICD-10-CM | POA: Diagnosis not present

## 2020-07-02 DIAGNOSIS — I10 Essential (primary) hypertension: Secondary | ICD-10-CM | POA: Diagnosis not present

## 2020-07-02 DIAGNOSIS — R9431 Abnormal electrocardiogram [ECG] [EKG]: Secondary | ICD-10-CM | POA: Diagnosis not present

## 2020-07-02 DIAGNOSIS — Z79899 Other long term (current) drug therapy: Secondary | ICD-10-CM | POA: Diagnosis not present

## 2020-07-02 DIAGNOSIS — I959 Hypotension, unspecified: Secondary | ICD-10-CM | POA: Diagnosis not present

## 2020-07-02 DIAGNOSIS — I671 Cerebral aneurysm, nonruptured: Secondary | ICD-10-CM | POA: Diagnosis not present

## 2020-07-02 DIAGNOSIS — D649 Anemia, unspecified: Secondary | ICD-10-CM | POA: Diagnosis present

## 2020-07-02 DIAGNOSIS — I498 Other specified cardiac arrhythmias: Secondary | ICD-10-CM | POA: Insufficient documentation

## 2020-07-02 DIAGNOSIS — R4701 Aphasia: Secondary | ICD-10-CM | POA: Diagnosis not present

## 2020-07-02 LAB — SARS CORONAVIRUS 2 BY RT PCR (HOSPITAL ORDER, PERFORMED IN ~~LOC~~ HOSPITAL LAB): SARS Coronavirus 2: NEGATIVE

## 2020-07-02 LAB — COMPREHENSIVE METABOLIC PANEL
ALT: 13 U/L (ref 0–44)
AST: 20 U/L (ref 15–41)
Albumin: 3.6 g/dL (ref 3.5–5.0)
Alkaline Phosphatase: 86 U/L (ref 38–126)
Anion gap: 10 (ref 5–15)
BUN: 34 mg/dL — ABNORMAL HIGH (ref 8–23)
CO2: 20 mmol/L — ABNORMAL LOW (ref 22–32)
Calcium: 8.7 mg/dL — ABNORMAL LOW (ref 8.9–10.3)
Chloride: 102 mmol/L (ref 98–111)
Creatinine, Ser: 1.57 mg/dL — ABNORMAL HIGH (ref 0.61–1.24)
GFR calc Af Amer: 44 mL/min — ABNORMAL LOW (ref >60)
GFR calc non Af Amer: 38 mL/min — ABNORMAL LOW (ref >60)
Glucose, Bld: 143 mg/dL — ABNORMAL HIGH (ref 70–99)
Potassium: 3.9 mmol/L (ref 3.5–5.1)
Sodium: 132 mmol/L — ABNORMAL LOW (ref 135–145)
Total Bilirubin: 0.7 mg/dL (ref 0.3–1.2)
Total Protein: 7.2 g/dL (ref 6.5–8.1)

## 2020-07-02 LAB — I-STAT CHEM 8, ED
BUN: 31 mg/dL — ABNORMAL HIGH (ref 8–23)
Calcium, Ion: 1.2 mmol/L (ref 1.15–1.40)
Chloride: 101 mmol/L (ref 98–111)
Creatinine, Ser: 1.6 mg/dL — ABNORMAL HIGH (ref 0.61–1.24)
Glucose, Bld: 141 mg/dL — ABNORMAL HIGH (ref 70–99)
HCT: 33 % — ABNORMAL LOW (ref 39.0–52.0)
Hemoglobin: 11.2 g/dL — ABNORMAL LOW (ref 13.0–17.0)
Potassium: 4.1 mmol/L (ref 3.5–5.1)
Sodium: 136 mmol/L (ref 135–145)
TCO2: 21 mmol/L — ABNORMAL LOW (ref 22–32)

## 2020-07-02 LAB — TROPONIN I (HIGH SENSITIVITY)
Troponin I (High Sensitivity): 19 ng/L — ABNORMAL HIGH (ref <18)
Troponin I (High Sensitivity): 20 ng/L — ABNORMAL HIGH (ref <18)

## 2020-07-02 LAB — DIFFERENTIAL
Abs Immature Granulocytes: 0.01 10*3/uL (ref 0.00–0.07)
Basophils Absolute: 0.1 10*3/uL (ref 0.0–0.1)
Basophils Relative: 1 %
Eosinophils Absolute: 0.2 10*3/uL (ref 0.0–0.5)
Eosinophils Relative: 3 %
Immature Granulocytes: 0 %
Lymphocytes Relative: 14 %
Lymphs Abs: 0.9 10*3/uL (ref 0.7–4.0)
Monocytes Absolute: 0.9 10*3/uL (ref 0.1–1.0)
Monocytes Relative: 14 %
Neutro Abs: 4.3 10*3/uL (ref 1.7–7.7)
Neutrophils Relative %: 68 %

## 2020-07-02 LAB — CBC
HCT: 31.4 % — ABNORMAL LOW (ref 39.0–52.0)
Hemoglobin: 9.9 g/dL — ABNORMAL LOW (ref 13.0–17.0)
MCH: 25.4 pg — ABNORMAL LOW (ref 26.0–34.0)
MCHC: 31.5 g/dL (ref 30.0–36.0)
MCV: 80.7 fL (ref 80.0–100.0)
Platelets: 257 10*3/uL (ref 150–400)
RBC: 3.89 MIL/uL — ABNORMAL LOW (ref 4.22–5.81)
RDW: 18.6 % — ABNORMAL HIGH (ref 11.5–15.5)
WBC: 6.4 10*3/uL (ref 4.0–10.5)
nRBC: 0 % (ref 0.0–0.2)

## 2020-07-02 LAB — PROTIME-INR
INR: 1.5 — ABNORMAL HIGH (ref 0.8–1.2)
Prothrombin Time: 17.4 seconds — ABNORMAL HIGH (ref 11.4–15.2)

## 2020-07-02 LAB — ETHANOL: Alcohol, Ethyl (B): <10 mg/dL (ref <10)

## 2020-07-02 LAB — CBG MONITORING, ED: Glucose-Capillary: 156 mg/dL — ABNORMAL HIGH (ref 70–99)

## 2020-07-02 LAB — APTT: aPTT: 42 seconds — ABNORMAL HIGH (ref 24–36)

## 2020-07-02 MED ORDER — STROKE: EARLY STAGES OF RECOVERY BOOK
Freq: Once | Status: DC
  Filled 2020-07-02: qty 1

## 2020-07-02 MED ORDER — LEVOTHYROXINE SODIUM 50 MCG PO TABS
50.0000 ug | ORAL_TABLET | Freq: Every day | ORAL | Status: DC
  Administered 2020-07-03: 50 ug via ORAL
  Filled 2020-07-02: qty 1

## 2020-07-02 MED ORDER — IOHEXOL 350 MG/ML SOLN
75.0000 mL | Freq: Once | INTRAVENOUS | Status: AC | PRN
  Administered 2020-07-02: 75 mL via INTRAVENOUS

## 2020-07-02 MED ORDER — ATORVASTATIN CALCIUM 40 MG PO TABS
40.0000 mg | ORAL_TABLET | Freq: Every morning | ORAL | Status: DC
  Administered 2020-07-03: 40 mg via ORAL
  Filled 2020-07-02: qty 1

## 2020-07-02 MED ORDER — APIXABAN 2.5 MG PO TABS
2.5000 mg | ORAL_TABLET | Freq: Two times a day (BID) | ORAL | Status: DC
  Administered 2020-07-03: 2.5 mg via ORAL
  Filled 2020-07-02: qty 1

## 2020-07-02 MED ORDER — PANTOPRAZOLE SODIUM 40 MG PO TBEC
40.0000 mg | DELAYED_RELEASE_TABLET | Freq: Two times a day (BID) | ORAL | Status: DC
  Administered 2020-07-03: 40 mg via ORAL
  Filled 2020-07-02: qty 1

## 2020-07-02 NOTE — Consult Note (Addendum)
[facility] Consult Services   Date of Service:   07/02/2020 18:27:05  Impression:     .  I63.9 - Cerebrovascular accident (CVA), unspecified mechanism ([place])  Comments/Sign-Out: 86 year old male with a history of atrial fibrillation on Xarelto, hypertension, coronary artery disease, hyperlipidemia. Presents with the abrupt onset of left hemiparesis. He does have mild drift in the left upper and lower extremity as well as facial droop as well as a left upper quadrantanopsia. Not a TPA candidate due to Xarelto use within the past 48 hours. CT angiogram head and neck pending. If no LVO, recommend admission for stroke work-up.  Metrics: Last Known Well: 07/02/2020 16:00:00 [facility] Notification Time: 07/02/2020 18:27:04 Arrival Time: 07/02/2020 18:20:00 Stamp Time: 07/02/2020 18:27:05 Time First Login Attempt: 07/02/2020 18:27:36 Symptoms: Left hemiparesis.Marland Kitchen NIHSS Start Assessment Time: 07/02/2020 18:42:11 Patient is not a candidate for Thrombolytic. Thrombolytic Medical Decision: 07/02/2020 18:50:29 Patient was not deemed candidate for Thrombolytic because of following reasons: Use of NOAs within 48 hours.  CT head showed no acute hemorrhage or acute core infarct.  ED Physician notified of diagnostic impression and management plan on 07/02/2020 18:53:10  Advanced Imaging: CTA Head and Neck ordered   Our recommendations are outlined below.  Recommendations:     .  Activate Stroke Protocol Admission/Order Set     .  Stroke/Telemetry Floor     .  Neuro Checks     .  Bedside Swallow Eval     .  DVT Prophylaxis     .  IV Fluids, Normal Saline     .  Head of Bed 30 Degrees     .  Euglycemia and Avoid Hyperthermia (PRN Acetaminophen)     .  MRI brain without IV contrast if able to (does have pacemaker in)     .  TSH, A1c, lipid profile     .  Continue AC     .  Transthoracic echocardiogram     .  Continuous telemetry     .  Physical,  occupational, and speech therapies     .  q4h neuro checks/NIHSS     .  NPO until bedside swallow     .  Neurology follow-up  Routine Consultation with [facility] for Follow up Care  Sign Out:     .  Discussed with Emergency Department Provider    ------------------------------------------------------------------------------  History of Present Illness: Patient is a 86 year old Male.  Patient was brought by EMS for symptoms of Left hemiparesis.Marland Kitchen  5M h/o HTN, HLD, CAD, afib on Xarelto. Presents with dysarthria, left facial droop. Exam shows mild left hemiparesis, left facial droop, dysarthria. Last took Xarelto last night. CTH neg for acute abnl. Does have a history of pacemaker placement.   Past Medical History:     . Hypertension     . Hyperlipidemia     . Atrial Fibrillation     . Coronary Artery Disease     . There is NO history of Diabetes Mellitus     . There is NO history of Stroke     . pacemaker  Social History: Smoking: No Alcohol Use: No Drug Use: No  Anticoagulant use:  No  Antiplatelet use: No    Examination: BP(130/72), Pulse(64), Blood Glucose(156) 1A: Level of Consciousness - Alert; keenly responsive + 0 1B: Ask Month and Age - Both Questions Right + 0 1C: Blink Eyes & Squeeze Hands - Performs Both Tasks + 0 2: Test Horizontal Extraocular Movements - Normal +  0 3: Test Visual Fields - Partial Hemianopia + 1 4: Test Facial Palsy (Use Grimace if Obtunded) - Minor paralysis (flat nasolabial fold, smile asymmetry) + 1 5A: Test Left Arm Motor Drift - Drift, but doesn't hit bed + 1 5B: Test Right Arm Motor Drift - No Drift for 10 Seconds + 0 6A: Test Left Leg Motor Drift - Drift, but doesn't hit bed + 1 6B: Test Right Leg Motor Drift - No Drift for 5 Seconds + 0 7: Test Limb Ataxia (FNF/Heel-Shin) - No Ataxia + 0 8: Test Sensation - Normal; No sensory loss + 0 9: Test Language/Aphasia - Normal; No aphasia + 0 10: Test Dysarthria -  Mild-Moderate Dysarthria: Slurring but can be understood + 1 11: Test Extinction/Inattention - No abnormality + 0  NIHSS Score: 5  Pre-Morbid Modified Rankin Scale: 0 Points = No symptoms at all   Patient/Family was informed the Neurology Consult would occur via TeleHealth consult by way of interactive audio and video telecommunications and consented to receiving care in this manner.   Patient is being evaluated for possible acute neurologic impairment and high probability of imminent or life-threatening deterioration. I spent total of 35 minutes providing care to this patient, including time for face to face visit via telemedicine, review of medical records, imaging studies and discussion of findings with providers, the patient and/or family.   Dr Knox Royalty   [facility] [phone]  Case [id number]   UPDATE: No LVO on CTA head/neck. There's is some mixed density plaque proximal R ICA, but no hemodynamically significant stenosis.

## 2020-07-02 NOTE — ED Notes (Signed)
Dr Langston Masker at bedside to assess pt.

## 2020-07-02 NOTE — ED Notes (Signed)
Pt transported back to CT 

## 2020-07-02 NOTE — ED Provider Notes (Signed)
[facility] EMERGENCY DEPARTMENT Provider Note   CSN: [id number] Arrival date & time: 07/02/20  1820  An emergency department physician performed an initial assessment on this suspected stroke patient at 1824.  History Chief Complaint  Patient presents with  . Code Stroke    Callan Norden is a 86 y.o. male presented to emergency department by EMS as a code stroke with concern for confusion and slurred speech beginning around 4 PM today.  This was a witnessed event with family members.  The patient was reportedly out on the boat with family members.  He had been behaving at his baseline earlier in the day.  Noted around 4:00 that he appeared to "slumped over on his side" and was then having slurred speech and a questionable left-sided facial droop.  The family thinks that the patient was favoring his left side when ambulating afterwards.  The patient himself is very hard of hearing cannot provide much more history.  He tells me he denies any numbness or weakness.  He denies headache.  Medical chart review from cardiology shows that the patient has a history of A. fib on Xarelto, sinus node dysfunction status post [facility] pacemaker programmed VVI, chronic diastolic heart failure, and hypertension.  Update at 8 PM, additional history provided by the patient's son arrived.  He reports that for several weeks the patient has had episodes where "his legs lock up and he seems to not be able to walk".  Today while they were on the boat around 4 PM, the patient seemed to slump over to his left side.  His legs again had locked up and he was unable to bear weight or to walk.  While they were driving home they felt like the patient was confused, had some slurred speech, and a family member was concerned about possible facial droop.  When they got home, the patient again could not walk.  EMS arrived on scene around 6 PM.  His son tells me the patient has been evaluated in the past for the ambulatory  dysfunction.  Is not clear what is causing this.  They have checked his blood pressure at home when this happens and has been normal.  He reports the patient did have an echocardiogram done about 2 weeks ago as an outpatient via his cardiologist, was told everything was normal.  He also the pacemaker that was interrogated at that time.  HPI     Past Medical History:  Diagnosis Date  . Arthritis   . Atrial fibrillation ([place])   . Carotid artery disease ([place])    Dr. Kellie Simmering - s/p left CEA  . Coronary artery disease   . Essential hypertension, benign   . Hyperlipidemia   . 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 This mass was initially felt to be 1.7 cm at the time of presentation but had enlarged significantly to 4 cm clinically at the time of surgery. Pathologically it was 3.4 cm. A lymph node deep to the parotid was enlarged clinically at the time of surgery but was negative   . 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 Hemiparesis of left dominant side ([place]) 07/02/2020  . Normocytic anemia 07/02/2020  . Hypothyroidism 07/02/2020  . GI bleed 03/27/2018  . CAD (coronary artery disease), native coronary artery 03/27/2018  . Acute blood  loss anemia 03/27/2018  . HLD (hyperlipidemia) 03/27/2018  . Depression 03/27/2018  . Esophageal dysphagia   . History of radiation therapy   . Symptomatic anemia   . Weakness   . Carotid stenosis 09/27/2014  . Aftercare following surgery of the circulatory system 09/27/2014  . 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 Pacemaker-[employer] 10/02/2012  . Atrial fibrillation ([place]) 09/09/2012  . Essential hypertension, benign 09/09/2012  . Tachy-brady syndrome ([place]) 09/09/2012  . Occlusion and stenosis of carotid artery without mention of cerebral  infarction 03/19/2012    Past Surgical History:  Procedure Laterality Date  . CAROTID ENDARTERECTOMY Left Jan. 3, 2009   CE  . CATARACT EXTRACTION W/ INTRAOCULAR LENS IMPLANT     Bilateral  . ESOPHAGOGASTRODUODENOSCOPY N/A 03/29/2018   Procedure: ESOPHAGOGASTRODUODENOSCOPY (EGD);  Surgeon: Danie Binder, MD;  Location[facility];  Service: Endoscopy;  Laterality: N/A;  . EYE SURGERY    . INSERT / REPLACE / REMOVE PACEMAKER    . PACEMAKER INSERTION  10-01-2012  . PARATHYROIDECTOMY  10/28/2012  . PAROTIDECTOMY  10/28/2012   Procedure: PAROTIDECTOMY;  Surgeon: Ascencion Dike, MD;  Location: [place];  Service: ENT;  Laterality: Right;  Total Parotidectomy  . PERMANENT PACEMAKER INSERTION N/A 10/01/2012   Procedure: PERMANENT PACEMAKER INSERTION;  Surgeon: Evans Lance, MD;  Location: [facility] CATH LAB;  Service: Cardiovascular;  Laterality: N/A;       Family History  Problem Relation Age of Onset  . Heart disease Sister   . Heart attack Sister   . Cirrhosis Mother   . Cancer Brother        brain cancer  . Cancer Sister        stomach cancer    Social History   Tobacco Use  . Smoking status: Former Smoker    Types: Cigarettes    Quit date: 11/25/1972    Years since quitting: 47.6  . Smokeless tobacco: Never Used  Vaping Use  . Vaping Use: Never used  Substance Use Topics  . Alcohol use: Yes    Alcohol/week: 1.0 standard drink    Types: 1 Cans of beer per week    Comment: daily  . Drug use: No    Home Medications Prior to Admission medications   Medication Sig Start Date End Date Taking? Authorizing Provider  atorvastatin (LIPITOR) 40 MG tablet Take 40 mg by mouth every morning.    Yes [provider]  levothyroxine (SYNTHROID) 50 MCG tablet Take 1 tablet (50 mcg total) by mouth daily before breakfast. 12/26/15  Yes Penland, Kelby Fam, MD  losartan (COZAAR) 50 MG tablet Take 50 mg by mouth every morning.    Yes [provider]  metoprolol tartrate (LOPRESSOR)  25 MG tablet TAKE 1 TABLET TWICE A DAY Patient taking differently: Take 25 mg by mouth 2 (two) times daily.  12/02/13  Yes Evans Lance, MD  pantoprazole (PROTONIX) 40 MG tablet Take 1 tablet (40 mg total) by mouth 2 (two) times daily before a meal. Twice a day for 3 months and then start taking daily. 03/29/18  Yes Barton Dubois, MD  XARELTO 15 MG TABS tablet TAKE 1 TABLET BY MOUTH ONCE DAILY WITH SUPPER Patient taking differently: Take 15 mg by mouth daily with supper.  04/25/20  Yes Evans Lance, MD  ondansetron (ZOFRAN ODT) 4 MG disintegrating tablet 4mg  ODT q4 hours prn nausea/vomit 05/08/20   Milton Ferguson, MD    Allergies    Patient has  no known allergies.  Review of Systems   Review of Systems  Unable to perform ROS: Dementia (level 5 caveat)    Physical Exam Updated Vital Signs BP (!) 175/66   Pulse (!) 58   Temp 97.6 F (36.4 C) (Oral)   Resp 18   Ht 5\' 4"  (1.626 m)   Wt 65.3 kg   SpO2 99%   BMI 24.70 kg/m   Physical Exam Vitals and nursing note reviewed.  Constitutional:      Appearance: He is well-developed.     Comments: Speech mildly slurred, unclear what baseline is  HENT:     Head: Normocephalic and atraumatic.  Eyes:     Conjunctiva/sclera: Conjunctivae normal.  Cardiovascular:     Rate and Rhythm: Normal rate and regular rhythm.     Pulses: Normal pulses.  Pulmonary:     Effort: Pulmonary effort is normal. No respiratory distress.     Breath sounds: Normal breath sounds.  Abdominal:     Palpations: Abdomen is soft.     Tenderness: There is no abdominal tenderness.  Musculoskeletal:     Cervical back: Neck supple.  Skin:    General: Skin is warm and dry.  Neurological:     Mental Status: He is alert.     GCS: GCS eye subscore is 4. GCS verbal subscore is 5. GCS motor subscore is 6.     Cranial Nerves: Facial asymmetry present. No cranial nerve deficit.     Sensory: Sensation is intact. No sensory deficit.     Motor: Motor function is intact.  No weakness.     Coordination: Finger-Nose-Finger Test abnormal.     ED Results / Procedures / Treatments   Labs (all labs ordered are listed, but only abnormal results are displayed) Labs Reviewed  PROTIME-INR - Abnormal; Notable for the following components:      Result Value   Prothrombin Time 17.4 (*)    INR 1.5 (*)    All other components within normal limits  APTT - Abnormal; Notable for the following components:   aPTT 42 (*)    All other components within normal limits  CBC - Abnormal; Notable for the following components:   RBC 3.89 (*)    Hemoglobin 9.9 (*)    HCT 31.4 (*)    MCH 25.4 (*)    RDW 18.6 (*)    All other components within normal limits  COMPREHENSIVE METABOLIC PANEL - Abnormal; Notable for the following components:   Sodium 132 (*)    CO2 20 (*)    Glucose, Bld 143 (*)    BUN 34 (*)    Creatinine, Ser 1.57 (*)    Calcium 8.7 (*)    GFR calc non Af Amer 38 (*)    GFR calc Af Amer 44 (*)    All other components within normal limits  CBG MONITORING, ED - Abnormal; Notable for the following components:   Glucose-Capillary 156 (*)    All other components within normal limits  I-STAT CHEM 8, ED - Abnormal; Notable for the following components:   BUN 31 (*)    Creatinine, Ser 1.60 (*)    Glucose, Bld 141 (*)    TCO2 21 (*)    Hemoglobin 11.2 (*)    HCT 33.0 (*)    All other components within normal limits  TROPONIN I (HIGH SENSITIVITY) - Abnormal; Notable for the following components:   Troponin I (High Sensitivity) 19 (*)    All other components within  normal limits  TROPONIN I (HIGH SENSITIVITY) - Abnormal; Notable for the following components:   Troponin I (High Sensitivity) 20 (*)    All other components within normal limits  SARS CORONAVIRUS 2 BY RT PCR (HOSPITAL ORDER, [facility])  ETHANOL  DIFFERENTIAL  RAPID URINE DRUG SCREEN, HOSP PERFORMED  URINALYSIS, ROUTINE W REFLEX MICROSCOPIC  HEMOGLOBIN A1C  LIPID  PANEL    EKG EKG Interpretation  Date/Time:  Sunday July 02 2020 20:29:51 EDT Ventricular Rate:  60 PR Interval:    QRS Duration: 158 QT Interval:  462 QTC Calculation: 462 R Axis:   -71 Text Interpretation: Junctional rhythm Nonspecific IVCD with LAD LVH with secondary repolarization abnormality No STEMI Confirmed by Octaviano Glow [phone]) on 07/02/2020 8:42:54 PM   Radiology CT HEAD CODE STROKE WO CONTRAST  Result Date: 07/02/2020 CLINICAL DATA:  Code stroke. Acute neuro deficit. Left-sided weakness expressive aphasia EXAM: CT HEAD WITHOUT CONTRAST TECHNIQUE: Contiguous axial images were obtained from the base of the skull through the vertex without intravenous contrast. COMPARISON:  None. FINDINGS: Brain: Generalized atrophy without hydrocephalus. Mild white matter hypodensity bilaterally. Small chronic infarct left thalamus. Small chronic infarct left cerebellum. Negative for acute infarct, hemorrhage, mass Vascular: Negative for hyperdense vessel Skull: Negative Sinuses/Orbits: Bony thickening left maxillary sinus with mild mucosal edema. Mucosal edema in the ethmoid sinuses bilaterally. Mild right mastoid effusion. Other: None ASPECTS ([place] Stroke Program Early CT Score) - Ganglionic level infarction (caudate, lentiform nuclei, internal capsule, insula, M1-M3 cortex): 7 - Supraganglionic infarction (M4-M6 cortex): 3 Total score (0-10 with 10 being normal): 10 IMPRESSION: 1. No acute abnormality 2. ASPECTS is 10 3. These results were called by telephone at the time of interpretation on 07/02/2020 at 6:37 pm to provider Duaine Radin , who verbally acknowledged these results. Electronically Signed   By: Franchot Gallo M.D.   On: 07/02/2020 18:37   CT ANGIO HEAD CODE STROKE  Result Date: 07/02/2020 CLINICAL DATA:  Left-sided weakness and expressive aphasia EXAM: CT ANGIOGRAPHY HEAD AND NECK TECHNIQUE: Multidetector CT imaging of the head and neck was performed using the standard protocol  during bolus administration of intravenous contrast. Multiplanar CT image reconstructions and MIPs were obtained to evaluate the vascular anatomy. Carotid stenosis measurements (when applicable) are obtained utilizing NASCET criteria, using the distal internal carotid diameter as the denominator. CONTRAST:  32mL OMNIPAQUE IOHEXOL 350 MG/ML SOLN COMPARISON:  None. FINDINGS: CTA NECK FINDINGS SKELETON: There is no bony spinal canal stenosis. No lytic or blastic lesion. OTHER NECK: Normal pharynx, larynx and major salivary glands. No cervical lymphadenopathy. Unremarkable thyroid gland. UPPER CHEST: No pneumothorax or pleural effusion. No nodules or masses. AORTIC ARCH: There is mild calcific atherosclerosis of the aortic arch. 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 Conventional 3 vessel aortic branching pattern. There is atherosclerotic calcification within the proximal arch vessels. RIGHT CAROTID SYSTEM: There is extensive atherosclerotic calcification of the right carotid system. There is mixed density plaque causing an angiographic string sign of the proximal right internal carotid artery. The vessel remains patent and is of normal caliber distally. LEFT CAROTID SYSTEM: There is atherosclerotic calcification of the left carotid bifurcation without hemodynamically significant stenosis by NASCET criteria. VERTEBRAL ARTERIES: Right dominant configuration. Both origins are clearly patent. There is no dissection, occlusion or flow-limiting stenosis to the skull base (V1-V3 segments). CTA HEAD FINDINGS POSTERIOR CIRCULATION: --Vertebral arteries: Normal V4 segments. --Inferior cerebellar arteries: Normal. --Basilar artery: Normal. --  Superior cerebellar arteries: Normal. --Posterior cerebral arteries (PCA): Normal. ANTERIOR CIRCULATION: --Intracranial internal carotid arteries: There is atherosclerotic calcification of both internal carotid arteries at the skull  base with mild stenosis of the distal cavernous segment on the left. --Anterior cerebral arteries (ACA): Normal. Absent left A1 segment, normal variant --Middle cerebral arteries (MCA): Normal. VENOUS SINUSES: As permitted by contrast timing, patent. ANATOMIC VARIANTS: Fetal origin of the right posterior cerebral artery. Review of the MIP images confirms the above findings. IMPRESSION: 1. No intracranial arterial occlusion or high-grade stenosis. 2. Short segment angiographic string sign of the proximal right internal carotid artery secondary to mixed density plaque. The vessel remains patent and is of normal caliber distally. 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 4. Aortic Atherosclerosis (ICD10-I70.0). Electronically Signed   By: Ulyses Jarred M.D.   On: 07/02/2020 19:34   CT ANGIO NECK CODE STROKE  Result Date: 07/02/2020 CLINICAL DATA:  Left-sided weakness and expressive aphasia EXAM: CT ANGIOGRAPHY HEAD AND NECK TECHNIQUE: Multidetector CT imaging of the head and neck was performed using the standard protocol during bolus administration of intravenous contrast. Multiplanar CT image reconstructions and MIPs were obtained to evaluate the vascular anatomy. Carotid stenosis measurements (when applicable) are obtained utilizing NASCET criteria, using the distal internal carotid diameter as the denominator. CONTRAST:  68mL OMNIPAQUE IOHEXOL 350 MG/ML SOLN COMPARISON:  None. FINDINGS: CTA NECK FINDINGS SKELETON: There is no bony spinal canal stenosis. No lytic or blastic lesion. OTHER NECK: Normal pharynx, larynx and major salivary glands. No cervical lymphadenopathy. Unremarkable thyroid gland. UPPER CHEST: No pneumothorax or pleural effusion. No nodules or masses. AORTIC ARCH: There is mild calcific atherosclerosis of the aortic arch. 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 Conventional 3 vessel aortic branching pattern. There is atherosclerotic  calcification within the proximal arch vessels. RIGHT CAROTID SYSTEM: There is extensive atherosclerotic calcification of the right carotid system. There is mixed density plaque causing an angiographic string sign of the proximal right internal carotid artery. The vessel remains patent and is of normal caliber distally. LEFT CAROTID SYSTEM: There is atherosclerotic calcification of the left carotid bifurcation without hemodynamically significant stenosis by NASCET criteria. VERTEBRAL ARTERIES: Right dominant configuration. Both origins are clearly patent. There is no dissection, occlusion or flow-limiting stenosis to the skull base (V1-V3 segments). CTA HEAD FINDINGS POSTERIOR CIRCULATION: --Vertebral arteries: Normal V4 segments. --Inferior cerebellar arteries: Normal. --Basilar artery: Normal. --Superior cerebellar arteries: Normal. --Posterior cerebral arteries (PCA): Normal. ANTERIOR CIRCULATION: --Intracranial internal carotid arteries: There is atherosclerotic calcification of both internal carotid arteries at the skull base with mild stenosis of the distal cavernous segment on the left. --Anterior cerebral arteries (ACA): Normal. Absent left A1 segment, normal variant --Middle cerebral arteries (MCA): Normal. VENOUS SINUSES: As permitted by contrast timing, patent. ANATOMIC VARIANTS: Fetal origin of the right posterior cerebral artery. Review of the MIP images confirms the above findings. IMPRESSION: 1. No intracranial arterial occlusion or high-grade stenosis. 2. Short segment angiographic string sign of the proximal right internal carotid artery secondary to mixed density plaque. The vessel remains patent and is of normal caliber distally. 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 4. Aortic Atherosclerosis (ICD10-I70.0). Electronically Signed   By: Ulyses Jarred M.D.   On: 07/02/2020 19:34    Procedures Procedures (including critical care time)  Medications Ordered in ED Medications   stroke:  mapping our early stages of recovery book ( Does not apply Not Given 07/02/20 2159)  levothyroxine (  SYNTHROID) tablet 50 mcg (has no administration in time range)  atorvastatin (LIPITOR) tablet 40 mg (has no administration in time range)  pantoprazole (PROTONIX) EC tablet 40 mg (has no administration in time range)  apixaban (ELIQUIS) tablet 2.5 mg (2.5 mg Oral Not Given 07/03/20 0138)  iohexol (OMNIPAQUE) 350 MG/ML injection 75 mL (75 mLs Intravenous Contrast Given 07/02/20 1856)    ED Course  I have reviewed the triage vital signs and the nursing notes.  Pertinent labs & imaging results that were available during my care of the patient were reviewed by me and considered in my medical decision making (see chart for details).  86 yo male here as stroke alert with left sided facial droop, left-sided weakness and slurred speech reported by family onset 4 pm.    CTH and CTA without acute findings Neuro consulted and recommending MRI imaging, carotid dopplers  Patient's son provided supplemental history.  It appears he's had an echo in the past 2 weeks and was told it was normal.    DDx also included hypoglycemia vs dehydraiton vs ACS vs anemia vs other Labs reviewed and not indicative of these other conditions. ECG per my interpretation shows LBBB consistent with pacemaker pattern but does not meet sgarbossa criteria for STEMI.  I doubt ACS    Clinical Course as of Jul 04 139  Sun Jul 02, 2020  1841 No acute findings on [facility]   [MT]  2030 Plan to admit to the hospitalist per neurology recommendation for MRI and further stroke work-up.  On reassessment the patient has no new deficits.  He also spoke with his son who is now at bedside and updated the history.   [MT]  2042 Signed out to hospitalist   [MT]    Clinical Course User Index [MT] Curlie Sittner, Carola Rhine, MD    Final Clinical Impression(s) / ED Diagnoses Final diagnoses:  Slurred speech    Rx / DC Orders ED Discharge Orders     None       Quavion Boule, Carola Rhine, MD 07/03/20 0140

## 2020-07-02 NOTE — Progress Notes (Signed)
Code Stroke Time Documentation   1822 Call Time 1823 Beeper Time 2550 Exam Started  0164 Exam Finished [id number] Images sent to [place] Exam completed in [place] radiology called

## 2020-07-02 NOTE — H&P (Signed)
History and Physical    Fernando [name] WGY:[id number] DOB: 20-Sep-1927 DOA: 07/02/2020  PCP: Rosita Fire, MD   Patient coming from: Home.  I have personally briefly reviewed patient's old medical records in [place]  Chief Complaint: Code stroke.  Confusion, left facial droop left-sided weakness  HPI: Fernando [name] is a 86 y.o. male with medical history significant of osteoarthritis, chronic atrial fibrillation on Xarelto, carotid artery disease with history of left CEA, coronary artery disease, essential hypertension, hyperlipidemia, squamous cell carcinoma parotid gland, tachycardia-bradycardia syndrome, S/P pacemaker placement who is brought to the emergency department after family noticed that he was "slumped on his side" developing confusion and left-sided weakness around 1600 while he was out at the [place] with 2 oh his kids and 2 of his grandkids.  The patient was unable to ambulate after the symptoms started suddenly and had to be carried inside the vehicle.  While in the vehicle, the patient seemed to be slumping over the right, seemed confused and have mild slurred speech.   After coming back from the [facility] around 1800, he had improved, but still favoring his left side while ambulating.  They subsequently called EMS.  The patient's son Fernando [name]) stated that for the last few weeks he has had a episodes where he feels that he is extremities feel locked up and he is unable to ambulate.  He denies headache, slurred speech, blurred vision, nausea, emesis or vertigo sensation.  He denies rhinorrhea, sore throat, productive cough, wheezing or hemoptysis.  No chest pain, palpitations, diaphoresis, PND, orthopnea or recent pitting edema of the lower extremities.  No abdominal pain, nausea, vomiting, constipation, melena hematochezia.  No dysuria, frequency or hematuria.  No polyuria, polydipsia, polyphagia or blurred vision.  ED Course:  Initial vital signs were temperature 97.6 F, pulse 61,  respiration 18, blood pressure 139/63 mmHg and O2 sat 98% on room air.  Code stroke was called.  No TPA given since the patient is on anticoagulation.  CBC showed a white count of 6.4, hemoglobin 9.9 g/dL and platelets 257.  WBC differential was normal.  PT 17.4, INR 1.5 APTT 42.  LFTs were normal.  Sodium is 132 and CO2 20 mmol/L.  The rest of the electrolytes were within normal range when calcium corrected to albumin.  Glucose 143, BUN 34 and creatinine 1.57 mg/dL.  Troponin was 19 and then 20 ng/L.  Imaging: No acute stroke was seen on imaging.  Please see images and full regular report for further detail.  Review of Systems: As per HPI otherwise all other systems reviewed and are negative.  Past Medical History:  Diagnosis Date  . Arthritis   . Atrial fibrillation ([place])   . Carotid artery disease ([place])    Dr. Kellie Simmering - s/p left CEA  . Coronary artery disease   . Essential hypertension, benign   . Hyperlipidemia   . 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 This mass was initially felt to be 1.7 cm at the time of presentation but had enlarged significantly to 4 cm clinically at the time of surgery. Pathologically it was 3.4 cm. A lymph node deep to the parotid was enlarged clinically at the time of surgery but was negative   . Tachycardia-bradycardia syndrome [facility]     Past Surgical History:  Procedure Laterality Date  . CAROTID ENDARTERECTOMY Left Jan. 3, 2009   CE  .  CATARACT EXTRACTION W/ INTRAOCULAR LENS IMPLANT     Bilateral  . ESOPHAGOGASTRODUODENOSCOPY N/A 03/29/2018   Procedure: ESOPHAGOGASTRODUODENOSCOPY (EGD);  Surgeon: Danie Binder, MD;  Location[facility];  Service: Endoscopy;  Laterality: N/A;  . EYE SURGERY    . INSERT / REPLACE / REMOVE PACEMAKER    . PACEMAKER INSERTION  10-01-2012  . PARATHYROIDECTOMY  10/28/2012  .  PAROTIDECTOMY  10/28/2012   Procedure: PAROTIDECTOMY;  Surgeon: Ascencion Dike, MD;  Location: [facility];  Service: ENT;  Laterality: Right;  Total Parotidectomy  . PERMANENT PACEMAKER INSERTION N/A 10/01/2012   Procedure: PERMANENT PACEMAKER INSERTION;  Surgeon: Evans Lance, MD;  Location: [facility] CATH LAB;  Service: Cardiovascular;  Laterality: N/A;    Social History  reports that he quit smoking about 47 years ago. His smoking use included cigarettes. He has never used smokeless tobacco. He reports current alcohol use of about 1.0 standard drink of alcohol per week. He reports that he does not use drugs.  No Known Allergies  Family History  Problem Relation Age of Onset  . Heart disease Sister   . Heart attack Sister   . Cirrhosis Mother   . Cancer Brother        brain cancer  . Cancer Sister        stomach cancer   Prior to Admission medications   Medication Sig Start Date End Date Taking? Authorizing Provider  atorvastatin (LIPITOR) 40 MG tablet Take 40 mg by mouth every morning.    Yes [provider]  levothyroxine (SYNTHROID) 50 MCG tablet Take 1 tablet (50 mcg total) by mouth daily before breakfast. 12/26/15  Yes Penland, Kelby Fam, MD  losartan (COZAAR) 50 MG tablet Take 50 mg by mouth every morning.    Yes [provider]  metoprolol tartrate (LOPRESSOR) 25 MG tablet TAKE 1 TABLET TWICE A DAY Patient taking differently: Take 25 mg by mouth 2 (two) times daily.  12/02/13  Yes Evans Lance, MD  pantoprazole (PROTONIX) 40 MG tablet Take 1 tablet (40 mg total) by mouth 2 (two) times daily before a meal. Twice a day for 3 months and then start taking daily. 03/29/18  Yes Barton Dubois, MD  XARELTO 15 MG TABS tablet TAKE 1 TABLET BY MOUTH ONCE DAILY WITH SUPPER Patient taking differently: Take 15 mg by mouth daily with supper.  04/25/20  Yes Evans Lance, MD  ondansetron (ZOFRAN ODT) 4 MG disintegrating tablet 4mg  ODT q4 hours prn nausea/vomit 05/08/20   Milton Ferguson, MD    Physical Exam: Vitals:   07/02/20 1905 07/02/20 1930 07/02/20 2000 07/02/20 2030  BP: (!) 151/59 (!) 135/54 (!) 153/63 (!) 187/79  Pulse: (!) 56 (!) 59 (!) 59 60  Resp: 19 (!) 21 17 (!) 21  Temp:      TempSrc:      SpO2: 91% 97% 97% 98%  Weight:      Height:       Constitutional: NAD, calm, comfortable Eyes: PERRL, EOMI, lids and conjunctivae normal ENMT: Mucous membranes are moist. Posterior pharynx clear of any exudate or lesions. Neck: normal, supple, no masses, no thyromegaly Respiratory: clear to auscultation bilaterally, no wheezing, no crackles. Normal respiratory effort. No accessory muscle use.  Cardiovascular: Irregularly irregular, 1/6 SEM.  Rubs / gallops. No extremity edema. 2+ pedal pulses. No carotid bruits.  Abdomen: no tenderness, no masses palpated. No hepatosplenomegaly. Bowel sounds positive.  Musculoskeletal: no clubbing / cyanosis. Good ROM, no contractures. Normal muscle tone.  Skin: Some areas of ecchymosis on extremities. Neurologic: Left facial droop.  Sensation seems to be equal.  Good coordination on nose to finger.Good handgrip bilaterally.  However, LLE is 4/5 when compared to the right. Psychiatric: Normal judgment and insight. Alert and oriented x 3. Normal mood.   Labs on Admission: I have personally reviewed following labs and imaging studies  CBC: Recent Labs  Lab 07/02/20 1842 07/02/20 1845  WBC  --  6.4  NEUTROABS  --  4.3  HGB 11.2* 9.9*  HCT 33.0* 31.4*  MCV  --  80.7  PLT  --  151    Basic Metabolic Panel: Recent Labs  Lab 07/02/20 1842 07/02/20 1845  NA 136 132*  K 4.1 3.9  CL 101 102  CO2  --  20*  GLUCOSE 141* 143*  BUN 31* 34*  CREATININE 1.60* 1.57*  CALCIUM  --  8.7*    GFR: Estimated Creatinine Clearance: 26.2 mL/min (A) (by C-G formula based on SCr of 1.57 mg/dL (H)).  Liver Function Tests: Recent Labs  Lab 07/02/20 1845  AST 20  ALT 13  ALKPHOS 86  BILITOT 0.7  PROT 7.2  ALBUMIN 3.6   Radiological  Exams on Admission: CT HEAD CODE STROKE WO CONTRAST  Result Date: 07/02/2020 CLINICAL DATA:  Code stroke. Acute neuro deficit. Left-sided weakness expressive aphasia EXAM: CT HEAD WITHOUT CONTRAST TECHNIQUE: Contiguous axial images were obtained from the base of the skull through the vertex without intravenous contrast. COMPARISON:  None. FINDINGS: Brain: Generalized atrophy without hydrocephalus. Mild white matter hypodensity bilaterally. Small chronic infarct left thalamus. Small chronic infarct left cerebellum. Negative for acute infarct, hemorrhage, mass Vascular: Negative for hyperdense vessel Skull: Negative Sinuses/Orbits: Bony thickening left maxillary sinus with mild mucosal edema. Mucosal edema in the ethmoid sinuses bilaterally. Mild right mastoid effusion. Other: None ASPECTS ([place] Stroke Program Early CT Score) - Ganglionic level infarction (caudate, lentiform nuclei, internal capsule, insula, M1-M3 cortex): 7 - Supraganglionic infarction (M4-M6 cortex): 3 Total score (0-10 with 10 being normal): 10 IMPRESSION: 1. No acute abnormality 2. ASPECTS is 10 3. These results were called by telephone at the time of interpretation on 07/02/2020 at 6:37 pm to provider MATTHEW TRIFAN , who verbally acknowledged these results. Electronically Signed   By: Franchot Gallo M.D.   On: 07/02/2020 18:37   CT ANGIO HEAD CODE STROKE  Result Date: 07/02/2020 CLINICAL DATA:  Left-sided weakness and expressive aphasia EXAM: CT ANGIOGRAPHY HEAD AND NECK TECHNIQUE: Multidetector CT imaging of the head and neck was performed using the standard protocol during bolus administration of intravenous contrast. Multiplanar CT image reconstructions and MIPs were obtained to evaluate the vascular anatomy. Carotid stenosis measurements (when applicable) are obtained utilizing NASCET criteria, using the distal internal carotid diameter as the denominator. CONTRAST:  60mL OMNIPAQUE IOHEXOL 350 MG/ML SOLN COMPARISON:  None. FINDINGS:  CTA NECK FINDINGS SKELETON: There is no bony spinal canal stenosis. No lytic or blastic lesion. OTHER NECK: Normal pharynx, larynx and major salivary glands. No cervical lymphadenopathy. Unremarkable thyroid gland. UPPER CHEST: No pneumothorax or pleural effusion. No nodules or masses. AORTIC ARCH: There is mild calcific atherosclerosis of the aortic arch. 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 Conventional 3 vessel aortic branching pattern. There is atherosclerotic calcification within the proximal arch vessels. RIGHT CAROTID SYSTEM: There is extensive atherosclerotic calcification of the right carotid system. There is mixed density plaque causing an angiographic string sign of the proximal right  internal carotid artery. The vessel remains patent and is of normal caliber distally. LEFT CAROTID SYSTEM: There is atherosclerotic calcification of the left carotid bifurcation without hemodynamically significant stenosis by NASCET criteria. VERTEBRAL ARTERIES: Right dominant configuration. Both origins are clearly patent. There is no dissection, occlusion or flow-limiting stenosis to the skull base (V1-V3 segments). CTA HEAD FINDINGS POSTERIOR CIRCULATION: --Vertebral arteries: Normal V4 segments. --Inferior cerebellar arteries: Normal. --Basilar artery: Normal. --Superior cerebellar arteries: Normal. --Posterior cerebral arteries (PCA): Normal. ANTERIOR CIRCULATION: --Intracranial internal carotid arteries: There is atherosclerotic calcification of both internal carotid arteries at the skull base with mild stenosis of the distal cavernous segment on the left. --Anterior cerebral arteries (ACA): Normal. Absent left A1 segment, normal variant --Middle cerebral arteries (MCA): Normal. VENOUS SINUSES: As permitted by contrast timing, patent. ANATOMIC VARIANTS: Fetal origin of the right posterior cerebral artery. Review of the MIP images confirms the above findings.  IMPRESSION: 1. No intracranial arterial occlusion or high-grade stenosis. 2. Short segment angiographic string sign of the proximal right internal carotid artery secondary to mixed density plaque. The vessel remains patent and is of normal caliber distally. 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 4. Aortic Atherosclerosis (ICD10-I70.0). Electronically Signed   By: Ulyses Jarred M.D.   On: 07/02/2020 19:34   CT ANGIO NECK CODE STROKE  Result Date: 07/02/2020 CLINICAL DATA:  Left-sided weakness and expressive aphasia EXAM: CT ANGIOGRAPHY HEAD AND NECK TECHNIQUE: Multidetector CT imaging of the head and neck was performed using the standard protocol during bolus administration of intravenous contrast. Multiplanar CT image reconstructions and MIPs were obtained to evaluate the vascular anatomy. Carotid stenosis measurements (when applicable) are obtained utilizing NASCET criteria, using the distal internal carotid diameter as the denominator. CONTRAST:  5mL OMNIPAQUE IOHEXOL 350 MG/ML SOLN COMPARISON:  None. FINDINGS: CTA NECK FINDINGS SKELETON: There is no bony spinal canal stenosis. No lytic or blastic lesion. OTHER NECK: Normal pharynx, larynx and major salivary glands. No cervical lymphadenopathy. Unremarkable thyroid gland. UPPER CHEST: No pneumothorax or pleural effusion. No nodules or masses. AORTIC ARCH: There is mild calcific atherosclerosis of the aortic arch. 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 Conventional 3 vessel aortic branching pattern. There is atherosclerotic calcification within the proximal arch vessels. RIGHT CAROTID SYSTEM: There is extensive atherosclerotic calcification of the right carotid system. There is mixed density plaque causing an angiographic string sign of the proximal right internal carotid artery. The vessel remains patent and is of normal caliber distally. LEFT CAROTID SYSTEM: There is atherosclerotic calcification of  the left carotid bifurcation without hemodynamically significant stenosis by NASCET criteria. VERTEBRAL ARTERIES: Right dominant configuration. Both origins are clearly patent. There is no dissection, occlusion or flow-limiting stenosis to the skull base (V1-V3 segments). CTA HEAD FINDINGS POSTERIOR CIRCULATION: --Vertebral arteries: Normal V4 segments. --Inferior cerebellar arteries: Normal. --Basilar artery: Normal. --Superior cerebellar arteries: Normal. --Posterior cerebral arteries (PCA): Normal. ANTERIOR CIRCULATION: --Intracranial internal carotid arteries: There is atherosclerotic calcification of both internal carotid arteries at the skull base with mild stenosis of the distal cavernous segment on the left. --Anterior cerebral arteries (ACA): Normal. Absent left A1 segment, normal variant --Middle cerebral arteries (MCA): Normal. VENOUS SINUSES: As permitted by contrast timing, patent. ANATOMIC VARIANTS: Fetal origin of the right posterior cerebral artery. Review of the MIP images confirms the above findings. IMPRESSION: 1. No intracranial arterial occlusion or high-grade stenosis. 2. Short segment angiographic string sign of the proximal right internal carotid artery secondary to mixed  density plaque. The vessel remains patent and is of normal caliber distally. 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 4. Aortic Atherosclerosis (ICD10-I70.0). Electronically Signed   By: Ulyses Jarred M.D.   On: 07/02/2020 19:34    EKG: Independently reviewed.  Vent. rate 60 BPM PR interval * ms QRS duration 158 ms QT/QTc 462/462 ms P-R-T axes * -71 88 Junctional rhythm Nonspecific IVCD with LAD LVH with secondary repolarization abnormality  Assessment/Plan Principal Problem:   Hemiparesis of left dominant side (HCC) Observation/telemetry. Frequent neuro checks. Swallow screen. PT/OT/SLP. Check hemoglobin A1c Check fasting lipids. Carotid Doppler and echocardiogram. Check MRI of brain without  contrast. On apixaban. Allow permissive hypertension. Consult neurology.  Active Problems:   Atrial fibrillation (HCC) CHA?DS?-VASc Score of at least 4. Continue anticoagulation.      Essential hypertension, benign Allow permissive hypertension. Monitor blood pressure.    CAD (coronary artery disease), native coronary artery On anticoagulation. Continue atorvastatin. Hold metoprolol.    HLD (hyperlipidemia) On atorvastatin.    Normocytic anemia Likely due to chronic AC. Monitor H&H.    Hypothyroidism Continue levothyroxine 50 mcg p.o. daily.    DVT prophylaxis: On Xarelto (changed to apixaban due to GFR). Code Status:   Full code. Family Communication:  His son Fernando [name] was in the ED room and provided information. Disposition Plan:   Patient is from:  Home.  Anticipated DC to:  Home.  Anticipated DC date:  07/03/2020 or 07/04/2020.  Anticipated DC barriers: Work-up and clinical status.  Consults called:  Routine neurology consult. Admission status:  Observation/telemetry.  Severity of Illness:  Reubin Milan MD [facility]  How to contact the [facility] Attending or Consulting provider [place] or covering provider during after hours [place], for this patient?   1. Check the care team in [facility] and look for a) attending/consulting [facility] provider listed and b) the [facility] team listed 2. Log into [email or web address] and use [facility]'s universal password to access. If you do not have the password, please contact the hospital operator. 3. Locate the [facility] provider you are looking for under [facility] and page to a number that you can be directly reached. 4. If you still have difficulty reaching the provider, please page the [facility] (Director on Call) for the Hospitalists listed on amion for assistance.  07/02/2020, 10:47 PM   This document was prepared using Dragon voice recognition software and may contain some unintended transcription errors.

## 2020-07-02 NOTE — ED Triage Notes (Signed)
Pt brought to ED via [facility] for confusion, slurred speech started at 1600 witnessed by family. [id number]

## 2020-07-02 NOTE — Progress Notes (Signed)
ANTICOAGULATION CONSULT NOTE - Initial Consult  Pharmacy Consult for eliquis Indication: atrial fibrillation  No Known Allergies  Patient Measurements: Height: 5\' 4"  (162.6 cm) Weight: 65.3 kg (143 lb 14.4 oz) IBW/kg (Calculated) : 59.2   Vital Signs: Temp: 97.6 F (36.4 C) (08/08 1841) Temp Source: Oral (08/08 1841) BP: 187/79 (08/08 2030) Pulse Rate: 60 (08/08 2030)  Labs: Recent Labs    07/02/20 1842 07/02/20 1845 07/02/20 2045  HGB 11.2* 9.9*  --   HCT 33.0* 31.4*  --   PLT  --  257  --   APTT  --  42*  --   LABPROT  --  17.4*  --   INR  --  1.5*  --   CREATININE 1.60* 1.57*  --   TROPONINIHS  --  19* 20*    Estimated Creatinine Clearance: 26.2 mL/min (A) (by C-G formula based on SCr of 1.57 mg/dL (H)).   Medical History: Past Medical History:  Diagnosis Date  . Arthritis   . Atrial fibrillation ([place])   . Carotid artery disease ([place])    Dr. Kellie Simmering - s/p left CEA  . Coronary artery disease   . Essential hypertension, benign   . Hyperlipidemia   . 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 This mass was initially felt to be 1.7 cm at the time of presentation but had enlarged significantly to 4 cm clinically at the time of surgery. Pathologically it was 3.4 cm. A lymph node deep to the parotid was enlarged clinically at the time of surgery but was negative   . Tachycardia-bradycardia syndrome ([place])     Medications:  See medication history  Assessment: 86 yo man on xarelto PTA to change to eliquis.  Last dose xarelto 8/7 at 19:00 Goal of Therapy:  Therapeutic anticoagulation Monitor platelets by anticoagulation protocol: Yes   Plan:  Eliquis 2.5 mg po bid F/u education  Excell Seltzer Poteet 07/02/2020,11:29 PM

## 2020-07-02 NOTE — ED Notes (Signed)
CODE STROKE paged out.  

## 2020-07-03 ENCOUNTER — Observation Stay (HOSPITAL_COMMUNITY): Payer: PPO

## 2020-07-03 ENCOUNTER — Observation Stay (HOSPITAL_BASED_OUTPATIENT_CLINIC_OR_DEPARTMENT_OTHER): Payer: PPO

## 2020-07-03 DIAGNOSIS — E039 Hypothyroidism, unspecified: Secondary | ICD-10-CM | POA: Diagnosis not present

## 2020-07-03 DIAGNOSIS — I340 Nonrheumatic mitral (valve) insufficiency: Secondary | ICD-10-CM | POA: Diagnosis not present

## 2020-07-03 DIAGNOSIS — I10 Essential (primary) hypertension: Secondary | ICD-10-CM

## 2020-07-03 DIAGNOSIS — I361 Nonrheumatic tricuspid (valve) insufficiency: Secondary | ICD-10-CM | POA: Diagnosis not present

## 2020-07-03 DIAGNOSIS — G8192 Hemiplegia, unspecified affecting left dominant side: Secondary | ICD-10-CM | POA: Diagnosis not present

## 2020-07-03 DIAGNOSIS — I4891 Unspecified atrial fibrillation: Secondary | ICD-10-CM | POA: Diagnosis not present

## 2020-07-03 LAB — URINALYSIS, ROUTINE W REFLEX MICROSCOPIC
Bacteria, UA: NONE SEEN
Bilirubin Urine: NEGATIVE
Glucose, UA: NEGATIVE mg/dL
Ketones, ur: 5 mg/dL — AB
Leukocytes,Ua: NEGATIVE
Nitrite: NEGATIVE
Protein, ur: NEGATIVE mg/dL
Specific Gravity, Urine: 1.019 (ref 1.005–1.030)
pH: 5 (ref 5.0–8.0)

## 2020-07-03 LAB — HEMOGLOBIN A1C
Hgb A1c MFr Bld: 5.9 % — ABNORMAL HIGH (ref 4.8–5.6)
Mean Plasma Glucose: 122.63 mg/dL

## 2020-07-03 LAB — ECHOCARDIOGRAM COMPLETE
Area-P 1/2: 3.79 cm2
Height: 64 in
MV M vel: 4.44 m/s
MV Peak grad: 78.9 mmHg
P 1/2 time: 561 msec
S' Lateral: 2.72 cm
Weight: 2302.4 oz

## 2020-07-03 LAB — LIPID PANEL
Cholesterol: 136 mg/dL (ref 0–200)
HDL: 67 mg/dL (ref >40)
LDL Cholesterol: 61 mg/dL (ref 0–99)
Total CHOL/HDL Ratio: 2 RATIO
Triglycerides: 40 mg/dL (ref <150)
VLDL: 8 mg/dL (ref 0–40)

## 2020-07-03 LAB — RAPID URINE DRUG SCREEN, HOSP PERFORMED
Amphetamines: NOT DETECTED
Barbiturates: NOT DETECTED
Benzodiazepines: NOT DETECTED
Cocaine: NOT DETECTED
Opiates: NOT DETECTED
Tetrahydrocannabinol: NOT DETECTED

## 2020-07-03 MED ORDER — ASPIRIN EC 81 MG PO TBEC
81.0000 mg | DELAYED_RELEASE_TABLET | Freq: Every day | ORAL | 0 refills | Status: AC
Start: 2020-07-03 — End: 2020-07-24

## 2020-07-03 NOTE — Progress Notes (Signed)
*  PRELIMINARY RESULTS* Echocardiogram 2D Echocardiogram has been performed.  Leavy Cella 07/03/2020, 9:46 AM

## 2020-07-03 NOTE — Discharge Instructions (Signed)

## 2020-07-03 NOTE — ED Notes (Signed)
Pt. Given applesauce and diet ginger ale.

## 2020-07-03 NOTE — Evaluation (Addendum)
Physical Therapy Evaluation Patient Details Name: Fernando [name] MRN: [id number] DOB: [birth year] Today's Date: 07/03/2020   History of Present Illness  Fernando [name] is a 86 y.o. male with medical history significant of osteoarthritis, chronic atrial fibrillation on Xarelto, carotid artery disease with history of left CEA, coronary artery disease, essential hypertension, hyperlipidemia, squamous cell carcinoma parotid gland, tachycardia-bradycardia syndrome, S/P pacemaker placement who is brought to the emergency department after family noticed that he was "slumped on his side" developing confusion and left-sided weakness around 1600 while he was out at the [employer] with 2 oh his kids and 2 of his grandkids.  The patient was unable to ambulate after the symptoms started suddenly and had to be carried inside the vehicle.  While in the vehicle, the patient seemed to be slumping over the right, seemed confused and have mild slurred speech.    Clinical Impression  The patient had difficulty following directions for transfers and ambulation, possibly due to cognitive deficits and difficulty hearing. The patient was able to transfer to the commode with minimal assistance and ambulated 100 feet without loss of balance. He demonstrated the physical ability to participate in basic bed mobility, transfers, and ambulation. A formal lower quarter screen was unable to be completed due to difficulty in following directions, but the patient did not display gross imbalance in strength or sensation between both lower extremities as noted during ambulation. The patient was left in the bed with son present after therapy today. Patient discharged from physical therapy to care of nursing for ambulation daily as tolerated for length of stay.      Follow Up Recommendations Home health PT;Supervision - Intermittent;Supervision for mobility/OOB    Equipment Recommendations    None recommended by PT.    Recommendations for  Other Services   None recommended by PT.     Precautions / Restrictions Precautions Precautions: Fall;ICD/Pacemaker Restrictions Weight Bearing Restrictions: No      Mobility  Bed Mobility Overal bed mobility: Modified Independent             General bed mobility comments: difficulty following directions for bed mobility   Transfers Overall transfer level: Modified independent Equipment used: Rolling walker (2 wheeled)                Ambulation/Gait Ambulation/Gait assistance: Min guard;Min assist Gait Distance (Feet):  (100) Assistive device: Rolling walker (2 wheeled) Gait Pattern/deviations: Step-through pattern;Decreased step length - right;Decreased step length - left;Shuffle Gait velocity: limited household ambulator   General Gait Details: mild difficulty maneuvering around obstacles  Stairs            Wheelchair Mobility    Modified Rankin (Stroke Patients Only)       Balance Overall balance assessment: Modified Independent                                           Pertinent Vitals/Pain Pain Assessment: No/denies pain    Home Living Family/patient expects to be discharged to:: Private residence Living Arrangements: Children Available Help at Discharge: Family Type of Home: House Home Access: Stairs to enter Entrance Stairs-Rails: Right;Left (railings on both sides, but can only reach one at a time) Entrance Stairs-Number of Steps: 5 Home Layout: One level Home Equipment: Walker - 2 wheels;Cane - single point;Bedside commode;Shower seat - built in      Prior Function Level of Independence: Needs assistance  Gait / Transfers Assistance Needed: mod assist  ADL's / Homemaking Assistance Needed: mod to max assist        Hand Dominance   Dominant Hand: Left    Extremity/Trunk Assessment        Lower Extremity Assessment Lower Extremity Assessment: Overall WFL for tasks assessed;Difficult to assess due  to impaired cognition    Cervical / Trunk Assessment Cervical / Trunk Assessment: Normal  Communication   Communication: HOH  Cognition Arousal/Alertness: Awake/alert Behavior During Therapy: Impulsive Overall Cognitive Status: History of cognitive impairments - at baseline                                        General Comments      Exercises     Assessment/Plan    PT Assessment Patent does not need any further PT services  PT Problem List         PT Treatment Interventions      PT Goals (Current goals can be found in the Care Plan section)  Acute Rehab PT Goals Patient Stated Goal: go home  PT Goal Formulation: All assessment and education complete, DC therapy Time For Goal Achievement: 07/03/20 Potential to Achieve Goals: Good    Frequency     Barriers to discharge        Co-evaluation               AM-PAC PT "6 Clicks" Mobility  Outcome Measure Help needed turning from your back to your side while in a flat bed without using bedrails?: None Help needed moving from lying on your back to sitting on the side of a flat bed without using bedrails?: A Little Help needed moving to and from a bed to a chair (including a wheelchair)?: A Lot Help needed standing up from a chair using your arms (e.g., wheelchair or bedside chair)?: A Little Help needed to walk in hospital room?: A Little Help needed climbing 3-5 steps with a railing? : A Lot 6 Click Score: 17    End of Session   Activity Tolerance: Patient tolerated treatment well Patient left: in bed;with call bell/phone within reach;with family/visitor present Nurse Communication: Mobility status;Weight bearing status PT Visit Diagnosis: Unsteadiness on feet (R26.81);Other abnormalities of gait and mobility (R26.89);Muscle weakness (generalized) (M62.81);Other symptoms and signs involving the nervous system (R29.898);Other (comment)    Time: 9983-3825 PT Time Calculation (min) (ACUTE  ONLY): 27 min   Charges:   PT Evaluation $PT Eval Moderate Complexity: 1 Mod PT Treatments $Therapeutic Activity: 23-37 mins        4:20 PM , 07/03/20 Karlyn Agee, SPT Physical Therapy with [facility] [phone] office   During this treatment session, the therapist was present, participating in and directing the treatment.  4:20 PM, 07/03/20 Lonell Grandchild, MPT Physical Therapist with [facility] [phone] office [phone] mobile phone

## 2020-07-03 NOTE — TOC Initial Note (Signed)
Transition of Care [facility]) - Initial/Assessment Note   Patient Details  Name: Fernando [name] MRN: [id number] Date of Birth: 1927/10/23  [facility]) CM/SW Contact:    Sherie Don, LCSW Phone Number: 07/03/2020, 4:17 PM  Clinical Narrative: Patient is a 86 year old male who is under observation for hemiparesis of left dominant side. Patient has a history of atrial fibrillation, hypertension, coronary artery disease, hyperlipidemia, and hypothyroidism. PT recommends HHPT. CSW contacted [facility], [facility], and [facility] to determine availability. [facility] unable to service [place] at this time, [facility] unable to service [place] due to limits with staffing. Tim with [facility] verifying if [facility] can provide services to patient. [facility] unable to provide services at this time. CSW made referral to [facility]. CSW informed by Vaughan Basta that PT services will not be available for several days. OP PT set up for patient. CSW called patient's daughter, Milana Obey, to notify her patient has been referred to OP PT. TOC signing off.  Expected Discharge Plan: [place] Barriers to Discharge: Continued Medical Work up  Patient Goals and CMS Choice Patient states their goals for this hospitalization and ongoing recovery are:: Return home CMS Medicare.gov Compare Post Acute Care list provided to:: Patient Represenative (must comment) Klye Besecker (son)) Choice offered to / list presented to : Adult Children  Expected Discharge Plan and Services Expected Discharge Plan: [place] In-house Referral: Clinical Social Work Discharge Planning Services: NA Post Acute Care Choice: [place] arrangements for the past 2 months: [place] Expected Discharge Date: 07/03/20               HH Arranged: PT [place]: [facility] [employer] [facility] [employer] Date [place]: 07/03/20 Time [place]: 1602 Representative spoke with at  [place]: Tim  Prior Living Arrangements/Services Living arrangements for the past 2 months: [place] Lives with:: Self Patient language and need for interpreter reviewed:: Yes Do you feel safe going back to the place where you live?: Yes      Need for Family Participation in Patient Care: Yes (Comment) (Code stroke called on patient) Care giver support system in place?: Yes (comment) Criminal Activity/Legal Involvement Pertinent to Current Situation/Hospitalization: No - Comment as needed  Activities of Daily Living Home Assistive Devices/Equipment: Gilford Rile (specify type) ADL Screening (condition at time of admission) Patient's cognitive ability adequate to safely complete daily activities?: Yes Is the patient deaf or have difficulty hearing?: No Does the patient have difficulty seeing, even when wearing glasses/contacts?: No Does the patient have difficulty concentrating, remembering, or making decisions?: No Patient able to express need for assistance with ADLs?: Yes Does the patient have difficulty dressing or bathing?: No Independently performs ADLs?: Yes (appropriate for developmental age) Does the patient have difficulty walking or climbing stairs?: Yes Weakness of Legs: Both Weakness of Arms/Hands: None  Permission Sought/Granted Permission sought to share information with : Other (comment) Permission granted to share info w AGENCY: [place] companies  Emotional Assessment Appearance:: Appears stated age Alcohol / Substance Use: Not Applicable Psych Involvement: No (comment)  Admission diagnosis:  Hemiparesis of left dominant side [facility]) [G81.92] Patient Active Problem List   Diagnosis Date Noted  . Hemiparesis of left dominant side ([place]) 07/02/2020  . Normocytic anemia 07/02/2020  . Hypothyroidism 07/02/2020  . GI bleed 03/27/2018  . CAD (coronary artery disease), native coronary artery 03/27/2018  . Acute blood loss anemia 03/27/2018  . HLD (hyperlipidemia)  03/27/2018  .  Depression 03/27/2018  . Esophageal dysphagia   . History of radiation therapy   . Symptomatic anemia   . Weakness   . Carotid stenosis 09/27/2014  . Aftercare following surgery of the circulatory system 09/27/2014  . Squamous cell carcinoma of parotid ([place]) 01/19/2013  . Pacemaker-St.Jude 10/02/2012  . Atrial fibrillation ([place]) 09/09/2012  . Essential hypertension, benign 09/09/2012  . Tachy-brady syndrome ([place]) 09/09/2012  . Occlusion and stenosis of carotid artery without mention of cerebral infarction 03/19/2012   PCP:  Rosita Fire, MD Pharmacy:   [place], [place] - [address] [place] [address] Phone: [phone] Fax: [phone]  [place] [address] [place] [address] [place] [address] Phone: [phone] Fax: [phone]  Readmission Risk Interventions No flowsheet data found.

## 2020-07-03 NOTE — Discharge Summary (Signed)
Physician Discharge Summary  [name] UKG:[id number] DOB: 05-13-1927 DOA: 07/02/2020  PCP: Rosita Fire, MD  Admit date: 07/02/2020 Discharge date: 07/03/2020  Admitted From: Home Disposition: Home  Recommendations for Outpatient Follow-up:  1. Follow up with PCP in 1-2 weeks 2. Please obtain BMP/CBC in one week 3. Patient has been advised not to drive until cleared by neurology 4. Follow-up with neurology on 8/11  Home Health: Home health PT Equipment/Devices:  Discharge Condition: Stable CODE STATUS: Full code Diet recommendation: Heart healthy  Brief/Interim Summary: HPI: Fernando [name] is a 86 y.o. male with medical history significant of osteoarthritis, chronic atrial fibrillation on Xarelto, carotid artery disease with history of left CEA, coronary artery disease, essential hypertension, hyperlipidemia, squamous cell carcinoma parotid gland, tachycardia-bradycardia syndrome, S/P pacemaker placement who is brought to the emergency department after family noticed that he was "slumped on his side" developing confusion and left-sided weakness around 1600 while he was out at the [place] with 2 oh his kids and 2 of his grandkids.  The patient was unable to ambulate after the symptoms started suddenly and had to be carried inside the vehicle.  While in the vehicle, the patient seemed to be slumping over the right, seemed confused and have mild slurred speech.   After coming back from the [facility] around 1800, he had improved, but still favoring his left side while ambulating.  They subsequently called EMS.  The patient's son Gershon Mussel) stated that for the last few weeks he has had a episodes where he feels that he is extremities feel locked up and he is unable to ambulate.  He denies headache, slurred speech, blurred vision, nausea, emesis or vertigo sensation.  He denies rhinorrhea, sore throat, productive cough, wheezing or hemoptysis.  No chest pain, palpitations, diaphoresis, PND, orthopnea or  recent pitting edema of the lower extremities.  No abdominal pain, nausea, vomiting, constipation, melena hematochezia.  No dysuria, frequency or hematuria.  No polyuria, polydipsia, polyphagia or blurred vision.  ED Course:  Initial vital signs were temperature 97.6 F, pulse 61, respiration 18, blood pressure 139/63 mmHg and O2 sat 98% on room air.  Code stroke was called.  No TPA given since the patient is on anticoagulation.  CBC showed a white count of 6.4, hemoglobin 9.9 g/dL and platelets 257.  WBC differential was normal.  PT 17.4, INR 1.5 APTT 42.  LFTs were normal.  Sodium is 132 and CO2 20 mmol/L.  The rest of the electrolytes were within normal range when calcium corrected to albumin.  Glucose 143, BUN 34 and creatinine 1.57 mg/dL.  Troponin was 19 and then 20 ng/L.  Discharge Diagnoses:  Principal Problem:   Hemiparesis of left dominant side (HCC) Active Problems:   Atrial fibrillation (HCC)   Essential hypertension, benign   CAD (coronary artery disease), native coronary artery   HLD (hyperlipidemia)   Normocytic anemia   Hypothyroidism  1. Transient weakness.  Patient presented with transient weakness, initially reported on the left side, but on further questioning, son reports that weakness involves both legs and possibly left arm.  Was feeling dizzy and lightheaded and felt as though he was going to pass out.  Suspect etiology may be TIA versus presyncope.  CT angiogram of the head and neck did not show any acute findings.  Unable to perform MRI due to presence of pacemaker.  His pacemaker was interrogated and no significant arrhythmias were noted.  Echocardiogram performed with results currently pending at the time of discharge.  While being  monitored in the hospital, patient symptoms have completely resolved and he feels back to baseline.  He worked with physical therapy with recommendations for home health physical therapy.  Case was reviewed with Dr. Merlene Laughter who recommended  starting the patient on low-dose aspirin in addition to anticoagulation for the next 3 weeks.  He will be followed up with Dr. Merlene Laughter later this week to determine if further work-up is necessary.  He is advised not to drive until he follows up with neurology. 2. Atrial fibrillation.  Heart rate is currently stable.  He is chronically anticoagulated on Xarelto. 3. Hypertension.  Resume home medications on discharge. 4. Coronary artery disease.  Continue on Lipitor and beta-blockers. 5. Hyperlipidemia.  Continue Lipitor 6. Hypothyroidism.  Continue on Synthroid 7. Chronic kidney disease stage IIIb.  Will discontinue losartan in light of renal dysfunction.  Discharge Instructions  Discharge Instructions    Ambulatory referral to Physical Therapy   Complete by: As directed    Diet - low sodium heart healthy   Complete by: As directed    Increase activity slowly   Complete by: As directed      Allergies as of 07/03/2020   No Known Allergies     Medication List    STOP taking these medications   losartan 50 MG tablet Commonly known as: COZAAR     TAKE these medications   aspirin EC 81 MG tablet Take 1 tablet (81 mg total) by mouth daily for 21 days. Swallow whole.   atorvastatin 40 MG tablet Commonly known as: LIPITOR Take 40 mg by mouth every morning.   levothyroxine 50 MCG tablet Commonly known as: Synthroid Take 1 tablet (50 mcg total) by mouth daily before breakfast.   metoprolol tartrate 25 MG tablet Commonly known as: LOPRESSOR TAKE 1 TABLET TWICE A DAY   ondansetron 4 MG disintegrating tablet Commonly known as: Zofran ODT 4mg  ODT q4 hours prn nausea/vomit   pantoprazole 40 MG tablet Commonly known as: PROTONIX Take 1 tablet (40 mg total) by mouth 2 (two) times daily before a meal. Twice a day for 3 months and then start taking daily.   Xarelto 15 MG Tabs tablet Generic drug: Rivaroxaban TAKE 1 TABLET BY MOUTH ONCE DAILY WITH SUPPER What changed: See the new  instructions.       Follow-up Information    Phillips Odor, MD Follow up on 07/05/2020.   Specialty: Neurology Why: 10:00am Contact information: [address] [place] [address] [phone]              No Known Allergies  Consultations:     Procedures/Studies: CUP PACEART INCLINIC DEVICE CHECK  Result Date: 06/16/2020 Pacemaker check in clinic. Normal device function. Thresholds, sensing, impedances consistent with previous measurements. Device programmed to maximize longevity. No high ventricular rates noted. Device programmed at appropriate safety margins. Histogram  distribution appropriate for patient activity level. Device programmed to optimize intrinsic conduction. Estimated longevity 2 yrs 10 months. Patient enrolled in remote follow-up and next remote transmission 07/27/20. Patient education completed.  CT HEAD CODE STROKE WO CONTRAST  Result Date: 07/02/2020 CLINICAL DATA:  Code stroke. Acute neuro deficit. Left-sided weakness expressive aphasia EXAM: CT HEAD WITHOUT CONTRAST TECHNIQUE: Contiguous axial images were obtained from the base of the skull through the vertex without intravenous contrast. COMPARISON:  None. FINDINGS: Brain: Generalized atrophy without hydrocephalus. Mild white matter hypodensity bilaterally. Small chronic infarct left thalamus. Small chronic infarct left cerebellum. Negative for acute infarct, hemorrhage, mass Vascular: Negative for  hyperdense vessel Skull: Negative Sinuses/Orbits: Bony thickening left maxillary sinus with mild mucosal edema. Mucosal edema in the ethmoid sinuses bilaterally. Mild right mastoid effusion. Other: None ASPECTS ([place] Stroke Program Early CT Score) - Ganglionic level infarction (caudate, lentiform nuclei, internal capsule, insula, M1-M3 cortex): 7 - Supraganglionic infarction (M4-M6 cortex): 3 Total score (0-10 with 10 being normal): 10 IMPRESSION: 1. No acute abnormality 2. ASPECTS is 10 3. These results  were called by telephone at the time of interpretation on 07/02/2020 at 6:37 pm to provider MATTHEW TRIFAN , who verbally acknowledged these results. Electronically Signed   By: Franchot Gallo M.D.   On: 07/02/2020 18:37   CT ANGIO HEAD CODE STROKE  Result Date: 07/02/2020 CLINICAL DATA:  Left-sided weakness and expressive aphasia EXAM: CT ANGIOGRAPHY HEAD AND NECK TECHNIQUE: Multidetector CT imaging of the head and neck was performed using the standard protocol during bolus administration of intravenous contrast. Multiplanar CT image reconstructions and MIPs were obtained to evaluate the vascular anatomy. Carotid stenosis measurements (when applicable) are obtained utilizing NASCET criteria, using the distal internal carotid diameter as the denominator. CONTRAST:  3mL OMNIPAQUE IOHEXOL 350 MG/ML SOLN COMPARISON:  None. FINDINGS: CTA NECK FINDINGS SKELETON: There is no bony spinal canal stenosis. No lytic or blastic lesion. OTHER NECK: Normal pharynx, larynx and major salivary glands. No cervical lymphadenopathy. Unremarkable thyroid gland. UPPER CHEST: No pneumothorax or pleural effusion. No nodules or masses. AORTIC ARCH: There is mild calcific atherosclerosis of the aortic arch. 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 Conventional 3 vessel aortic branching pattern. There is atherosclerotic calcification within the proximal arch vessels. RIGHT CAROTID SYSTEM: There is extensive atherosclerotic calcification of the right carotid system. There is mixed density plaque causing an angiographic string sign of the proximal right internal carotid artery. The vessel remains patent and is of normal caliber distally. LEFT CAROTID SYSTEM: There is atherosclerotic calcification of the left carotid bifurcation without hemodynamically significant stenosis by NASCET criteria. VERTEBRAL ARTERIES: Right dominant configuration. Both origins are clearly patent. There is no  dissection, occlusion or flow-limiting stenosis to the skull base (V1-V3 segments). CTA HEAD FINDINGS POSTERIOR CIRCULATION: --Vertebral arteries: Normal V4 segments. --Inferior cerebellar arteries: Normal. --Basilar artery: Normal. --Superior cerebellar arteries: Normal. --Posterior cerebral arteries (PCA): Normal. ANTERIOR CIRCULATION: --Intracranial internal carotid arteries: There is atherosclerotic calcification of both internal carotid arteries at the skull base with mild stenosis of the distal cavernous segment on the left. --Anterior cerebral arteries (ACA): Normal. Absent left A1 segment, normal variant --Middle cerebral arteries (MCA): Normal. VENOUS SINUSES: As permitted by contrast timing, patent. ANATOMIC VARIANTS: Fetal origin of the right posterior cerebral artery. Review of the MIP images confirms the above findings. IMPRESSION: 1. No intracranial arterial occlusion or high-grade stenosis. 2. Short segment angiographic string sign of the proximal right internal carotid artery secondary to mixed density plaque. The vessel remains patent and is of normal caliber distally. 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 4. Aortic Atherosclerosis (ICD10-I70.0). Electronically Signed   By: Ulyses Jarred M.D.   On: 07/02/2020 19:34   CT ANGIO NECK CODE STROKE  Result Date: 07/02/2020 CLINICAL DATA:  Left-sided weakness and expressive aphasia EXAM: CT ANGIOGRAPHY HEAD AND NECK TECHNIQUE: Multidetector CT imaging of the head and neck was performed using the standard protocol during bolus administration of intravenous contrast. Multiplanar CT image reconstructions and MIPs were obtained to evaluate the vascular anatomy. Carotid stenosis measurements (when applicable) are obtained utilizing NASCET criteria,  using the distal internal carotid diameter as the denominator. CONTRAST:  89mL OMNIPAQUE IOHEXOL 350 MG/ML SOLN COMPARISON:  None. FINDINGS: CTA NECK FINDINGS SKELETON: There is no bony spinal canal stenosis.  No lytic or blastic lesion. OTHER NECK: Normal pharynx, larynx and major salivary glands. No cervical lymphadenopathy. Unremarkable thyroid gland. UPPER CHEST: No pneumothorax or pleural effusion. No nodules or masses. AORTIC ARCH: There is mild calcific atherosclerosis of the aortic arch. 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 Conventional 3 vessel aortic branching pattern. There is atherosclerotic calcification within the proximal arch vessels. RIGHT CAROTID SYSTEM: There is extensive atherosclerotic calcification of the right carotid system. There is mixed density plaque causing an angiographic string sign of the proximal right internal carotid artery. The vessel remains patent and is of normal caliber distally. LEFT CAROTID SYSTEM: There is atherosclerotic calcification of the left carotid bifurcation without hemodynamically significant stenosis by NASCET criteria. VERTEBRAL ARTERIES: Right dominant configuration. Both origins are clearly patent. There is no dissection, occlusion or flow-limiting stenosis to the skull base (V1-V3 segments). CTA HEAD FINDINGS POSTERIOR CIRCULATION: --Vertebral arteries: Normal V4 segments. --Inferior cerebellar arteries: Normal. --Basilar artery: Normal. --Superior cerebellar arteries: Normal. --Posterior cerebral arteries (PCA): Normal. ANTERIOR CIRCULATION: --Intracranial internal carotid arteries: There is atherosclerotic calcification of both internal carotid arteries at the skull base with mild stenosis of the distal cavernous segment on the left. --Anterior cerebral arteries (ACA): Normal. Absent left A1 segment, normal variant --Middle cerebral arteries (MCA): Normal. VENOUS SINUSES: As permitted by contrast timing, patent. ANATOMIC VARIANTS: Fetal origin of the right posterior cerebral artery. Review of the MIP images confirms the above findings. IMPRESSION: 1. No intracranial arterial occlusion or high-grade  stenosis. 2. Short segment angiographic string sign of the proximal right internal carotid artery secondary to mixed density plaque. The vessel remains patent and is of normal caliber distally. 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 4. Aortic Atherosclerosis (ICD10-I70.0). Electronically Signed   By: Ulyses Jarred M.D.   On: 07/02/2020 19:34       Subjective: No further weakness.  Feels that he is back to baseline.  Wants to go home.  Discharge Exam: Vitals:   07/03/20 1430 07/03/20 1530 07/03/20 1600 07/03/20 1655  BP: (!) 160/52   (!) 179/61  Pulse: 65 60 62   Resp: 20 20 20    Temp:    97.8 F (36.6 C)  TempSrc:    Oral  SpO2: 100% 98% 97% (!) 0%  Weight:      Height:        General: Pt is alert, awake, not in acute distress Cardiovascular: RRR, S1/S2 +, no rubs, no gallops Respiratory: CTA bilaterally, no wheezing, no rhonchi Abdominal: Soft, NT, ND, bowel sounds + Extremities: no edema, no cyanosis Neuro: Strength is symmetrical in upper and lower extremities.  Reflexes in lower extremities are normal.    The results of significant diagnostics from this hospitalization (including imaging, microbiology, ancillary and laboratory) are listed below for reference.     Microbiology: Recent Results (from the past 240 hour(s))  SARS Coronavirus 2 by RT PCR (hospital order, performed in [facility] hospital lab) Nasopharyngeal Nasopharyngeal Swab     Status: None   Collection Time: 07/02/20  8:28 PM   Specimen: Nasopharyngeal Swab  Result Value Ref Range Status   SARS Coronavirus 2 NEGATIVE NEGATIVE Final    Comment: (NOTE) SARS-CoV-2 target nucleic acids are NOT DETECTED.  The SARS-CoV-2 RNA is generally  detectable in upper and lower respiratory specimens during the acute phase of infection. The lowest concentration of SARS-CoV-2 viral copies this assay can detect is 250 copies / mL. A negative result does not preclude SARS-CoV-2 infection and should not be used as the  sole basis for treatment or other patient management decisions.  A negative result may occur with improper specimen collection / handling, submission of specimen other than nasopharyngeal swab, presence of viral mutation(s) within the areas targeted by this assay, and inadequate number of viral copies (<250 copies / mL). A negative result must be combined with clinical observations, patient history, and epidemiological information.  Fact Sheet for Patients:   StrictlyIdeas.no  Fact Sheet for Healthcare Providers: BankingDealers.co.za  This test is not yet approved or  cleared by the [place] FDA and has been authorized for detection and/or diagnosis of SARS-CoV-2 by FDA under an Emergency Use Authorization (EUA).  This EUA will remain in effect (meaning this test can be used) for the duration of the COVID-19 declaration under Section 564(b)(1) of the Act, 21 U.S.C. section 360bbb-3(b)(1), unless the authorization is terminated or revoked sooner.  Performed at [facility], [address] [place], [place] [address]      Labs: BNP (last 3 results) No results for input(s): BNP in the last 8760 hours. Basic Metabolic Panel: Recent Labs  Lab 07/02/20 1842 07/02/20 1845  NA 136 132*  K 4.1 3.9  CL 101 102  CO2  --  20*  GLUCOSE 141* 143*  BUN 31* 34*  CREATININE 1.60* 1.57*  CALCIUM  --  8.7*   Liver Function Tests: Recent Labs  Lab 07/02/20 1845  AST 20  ALT 13  ALKPHOS 86  BILITOT 0.7  PROT 7.2  ALBUMIN 3.6   No results for input(s): LIPASE, AMYLASE in the last 168 hours. No results for input(s): AMMONIA in the last 168 hours. CBC: Recent Labs  Lab 07/02/20 1842 07/02/20 1845  WBC  --  6.4  NEUTROABS  --  4.3  HGB 11.2* 9.9*  HCT 33.0* 31.4*  MCV  --  80.7  PLT  --  257   Cardiac Enzymes: No results for input(s): CKTOTAL, CKMB, CKMBINDEX, TROPONINI in the last 168 hours. BNP: Invalid input(s):  POCBNP CBG: Recent Labs  Lab 07/02/20 1825  GLUCAP 156*   D-Dimer No results for input(s): DDIMER in the last 72 hours. Hgb A1c Recent Labs    07/03/20 0420  HGBA1C 5.9*   Lipid Profile Recent Labs    07/03/20 0420  CHOL 136  HDL 67  LDLCALC 61  TRIG 40  CHOLHDL 2.0   Thyroid function studies No results for input(s): TSH, T4TOTAL, T3FREE, THYROIDAB in the last 72 hours.  Invalid input(s): FREET3 Anemia work up No results for input(s): VITAMINB12, FOLATE, FERRITIN, TIBC, IRON, RETICCTPCT in the last 72 hours. Urinalysis    Component Value Date/Time   COLORURINE YELLOW 07/02/2020 1830   APPEARANCEUR CLEAR 07/02/2020 1830   LABSPEC 1.019 07/02/2020 1830   PHURINE 5.0 07/02/2020 1830   GLUCOSEU NEGATIVE 07/02/2020 1830   HGBUR SMALL (A) 07/02/2020 1830   BILIRUBINUR NEGATIVE 07/02/2020 1830   KETONESUR 5 (A) 07/02/2020 1830   PROTEINUR NEGATIVE 07/02/2020 1830   UROBILINOGEN 0.2 12/21/2007 1618   NITRITE NEGATIVE 07/02/2020 1830   LEUKOCYTESUR NEGATIVE 07/02/2020 1830   Sepsis Labs Invalid input(s): PROCALCITONIN,  WBC,  LACTICIDVEN Microbiology Recent Results (from the past 240 hour(s))  SARS Coronavirus 2 by RT PCR (hospital order, performed in  [facility] hospital lab) Nasopharyngeal Nasopharyngeal Swab     Status: None   Collection Time: 07/02/20  8:28 PM   Specimen: Nasopharyngeal Swab  Result Value Ref Range Status   SARS Coronavirus 2 NEGATIVE NEGATIVE Final    Comment: (NOTE) SARS-CoV-2 target nucleic acids are NOT DETECTED.  The SARS-CoV-2 RNA is generally detectable in upper and lower respiratory specimens during the acute phase of infection. The lowest concentration of SARS-CoV-2 viral copies this assay can detect is 250 copies / mL. A negative result does not preclude SARS-CoV-2 infection and should not be used as the sole basis for treatment or other patient management decisions.  A negative result may occur with improper specimen collection /  handling, submission of specimen other than nasopharyngeal swab, presence of viral mutation(s) within the areas targeted by this assay, and inadequate number of viral copies (<250 copies / mL). A negative result must be combined with clinical observations, patient history, and epidemiological information.  Fact Sheet for Patients:   StrictlyIdeas.no  Fact Sheet for Healthcare Providers: BankingDealers.co.za  This test is not yet approved or  cleared by the [place] FDA and has been authorized for detection and/or diagnosis of SARS-CoV-2 by FDA under an Emergency Use Authorization (EUA).  This EUA will remain in effect (meaning this test can be used) for the duration of the COVID-19 declaration under Section 564(b)(1) of the Act, 21 U.S.C. section 360bbb-3(b)(1), unless the authorization is terminated or revoked sooner.  Performed at [facility], [address], [place], [place] [address]      Time coordinating discharge: 8mins  SIGNED:   Kathie Dike, MD  [facility] 07/03/2020, 5:09 PM   If 7PM-7AM, please contact night-coverage [email or web address]

## 2020-07-03 NOTE — Progress Notes (Signed)
SLP Cancellation Note  Patient Details Name: Fernando [name] MRN: [id number] DOB: 1927-10-17   Cancelled treatment:       Reason Eval/Treat Not Completed: SLP screened, no needs identified, will sign off. Pt's speech and cognition are at baseline.    Fernando [name], CCC-SLP Speech Language Pathologist    Wende Bushy 07/03/2020, 12:09 PM

## 2020-07-06 DIAGNOSIS — G723 Periodic paralysis: Secondary | ICD-10-CM | POA: Diagnosis not present

## 2020-07-06 DIAGNOSIS — R296 Repeated falls: Secondary | ICD-10-CM | POA: Diagnosis not present

## 2020-07-06 DIAGNOSIS — I482 Chronic atrial fibrillation, unspecified: Secondary | ICD-10-CM | POA: Diagnosis not present

## 2020-07-06 DIAGNOSIS — I509 Heart failure, unspecified: Secondary | ICD-10-CM | POA: Diagnosis not present

## 2020-07-06 DIAGNOSIS — E039 Hypothyroidism, unspecified: Secondary | ICD-10-CM | POA: Diagnosis not present

## 2020-07-06 DIAGNOSIS — E1142 Type 2 diabetes mellitus with diabetic polyneuropathy: Secondary | ICD-10-CM | POA: Diagnosis not present

## 2020-07-06 DIAGNOSIS — Z79899 Other long term (current) drug therapy: Secondary | ICD-10-CM | POA: Diagnosis not present

## 2020-07-16 DIAGNOSIS — E039 Hypothyroidism, unspecified: Secondary | ICD-10-CM | POA: Diagnosis not present

## 2020-07-16 DIAGNOSIS — I10 Essential (primary) hypertension: Secondary | ICD-10-CM | POA: Diagnosis not present

## 2020-07-24 ENCOUNTER — Telehealth (HOSPITAL_COMMUNITY): Payer: Self-pay | Admitting: Physical Therapy

## 2020-07-24 NOTE — Telephone Encounter (Signed)
S/w Son Marcello Moores -Dad doesn't need PT anymore the MD remove on of his meds and that seems to have fixed the issue. Please Close referral

## 2020-07-25 ENCOUNTER — Ambulatory Visit (HOSPITAL_COMMUNITY): Payer: PPO | Admitting: Physical Therapy

## 2020-07-26 ENCOUNTER — Ambulatory Visit (INDEPENDENT_AMBULATORY_CARE_PROVIDER_SITE_OTHER): Payer: PPO | Admitting: *Deleted

## 2020-07-26 DIAGNOSIS — I495 Sick sinus syndrome: Secondary | ICD-10-CM | POA: Diagnosis not present

## 2020-07-26 LAB — CUP PACEART REMOTE DEVICE CHECK
Battery Remaining Longevity: 31 mo
Battery Remaining Percentage: 22 %
Battery Voltage: 2.78 V
Brady Statistic RV Percent Paced: 97 %
Date Time Interrogation Session: 20210830035756
Implantable Lead Implant Date: 20131107
Implantable Lead Implant Date: 20131107
Implantable Lead Location: 753859
Implantable Lead Location: 753860
Implantable Pulse Generator Implant Date: 20131107
Lead Channel Impedance Value: 350 Ohm
Lead Channel Pacing Threshold Amplitude: 0.75 V
Lead Channel Pacing Threshold Pulse Width: 0.4 ms
Lead Channel Sensing Intrinsic Amplitude: 12 mV
Lead Channel Setting Pacing Amplitude: 1 V
Lead Channel Setting Pacing Pulse Width: 0.4 ms
Lead Channel Setting Sensing Sensitivity: 4 mV
Pulse Gen Model: 2210
Pulse Gen Serial Number: 7411696

## 2020-07-28 NOTE — Progress Notes (Signed)
Remote pacemaker transmission.   

## 2020-08-16 DIAGNOSIS — I10 Essential (primary) hypertension: Secondary | ICD-10-CM | POA: Diagnosis not present

## 2020-08-16 DIAGNOSIS — E039 Hypothyroidism, unspecified: Secondary | ICD-10-CM | POA: Diagnosis not present

## 2020-09-15 DIAGNOSIS — E039 Hypothyroidism, unspecified: Secondary | ICD-10-CM | POA: Diagnosis not present

## 2020-09-15 DIAGNOSIS — I10 Essential (primary) hypertension: Secondary | ICD-10-CM | POA: Diagnosis not present

## 2020-10-06 ENCOUNTER — Inpatient Hospital Stay: Admit: 2020-10-06 | Payer: PPO | Admitting: Interventional Radiology

## 2020-10-06 ENCOUNTER — Emergency Department (HOSPITAL_COMMUNITY): Payer: PPO

## 2020-10-06 ENCOUNTER — Inpatient Hospital Stay (HOSPITAL_COMMUNITY): Payer: PPO | Admitting: Registered Nurse

## 2020-10-06 ENCOUNTER — Inpatient Hospital Stay (HOSPITAL_COMMUNITY): Payer: PPO

## 2020-10-06 ENCOUNTER — Inpatient Hospital Stay (HOSPITAL_COMMUNITY)
Admission: EM | Admit: 2020-10-06 | Discharge: 2020-10-14 | DRG: 023 | Disposition: A | Discharge status: Rehabilitation Facility | Payer: PPO | Attending: Neurology | Admitting: Neurology

## 2020-10-06 ENCOUNTER — Inpatient Hospital Stay: Admit: 2020-10-06 | Payer: PPO

## 2020-10-06 ENCOUNTER — Encounter (HOSPITAL_COMMUNITY): Payer: Self-pay | Admitting: Emergency Medicine

## 2020-10-06 ENCOUNTER — Encounter (HOSPITAL_COMMUNITY)
Admission: EM | Disposition: A | Discharge status: Rehabilitation Facility | Payer: Self-pay | Source: Home / Self Care | Attending: Neurology

## 2020-10-06 ENCOUNTER — Other Ambulatory Visit: Payer: Self-pay

## 2020-10-06 DIAGNOSIS — I6521 Occlusion and stenosis of right carotid artery: Secondary | ICD-10-CM | POA: Diagnosis not present

## 2020-10-06 DIAGNOSIS — Z20822 Contact with and (suspected) exposure to covid-19: Secondary | ICD-10-CM | POA: Diagnosis present

## 2020-10-06 DIAGNOSIS — E1122 Type 2 diabetes mellitus with diabetic chronic kidney disease: Secondary | ICD-10-CM | POA: Diagnosis present

## 2020-10-06 DIAGNOSIS — Z800 Family history of malignant neoplasm of digestive organs: Secondary | ICD-10-CM | POA: Diagnosis not present

## 2020-10-06 DIAGNOSIS — E785 Hyperlipidemia, unspecified: Secondary | ICD-10-CM | POA: Diagnosis present

## 2020-10-06 DIAGNOSIS — I130 Hypertensive heart and chronic kidney disease with heart failure and stage 1 through stage 4 chronic kidney disease, or unspecified chronic kidney disease: Secondary | ICD-10-CM | POA: Diagnosis present

## 2020-10-06 DIAGNOSIS — I2510 Atherosclerotic heart disease of native coronary artery without angina pectoris: Secondary | ICD-10-CM | POA: Diagnosis present

## 2020-10-06 DIAGNOSIS — R0602 Shortness of breath: Secondary | ICD-10-CM

## 2020-10-06 DIAGNOSIS — R414 Neurologic neglect syndrome: Secondary | ICD-10-CM | POA: Diagnosis present

## 2020-10-06 DIAGNOSIS — I088 Other rheumatic multiple valve diseases: Secondary | ICD-10-CM | POA: Diagnosis not present

## 2020-10-06 DIAGNOSIS — A419 Sepsis, unspecified organism: Secondary | ICD-10-CM | POA: Diagnosis not present

## 2020-10-06 DIAGNOSIS — D6489 Other specified anemias: Secondary | ICD-10-CM | POA: Diagnosis present

## 2020-10-06 DIAGNOSIS — E039 Hypothyroidism, unspecified: Secondary | ICD-10-CM | POA: Diagnosis present

## 2020-10-06 DIAGNOSIS — I5020 Unspecified systolic (congestive) heart failure: Secondary | ICD-10-CM

## 2020-10-06 DIAGNOSIS — E876 Hypokalemia: Secondary | ICD-10-CM | POA: Diagnosis present

## 2020-10-06 DIAGNOSIS — Z66 Do not resuscitate: Secondary | ICD-10-CM | POA: Diagnosis not present

## 2020-10-06 DIAGNOSIS — I63511 Cerebral infarction due to unspecified occlusion or stenosis of right middle cerebral artery: Principal | ICD-10-CM | POA: Diagnosis present

## 2020-10-06 DIAGNOSIS — Z950 Presence of cardiac pacemaker: Secondary | ICD-10-CM | POA: Diagnosis not present

## 2020-10-06 DIAGNOSIS — I495 Sick sinus syndrome: Secondary | ICD-10-CM | POA: Diagnosis present

## 2020-10-06 DIAGNOSIS — I6529 Occlusion and stenosis of unspecified carotid artery: Secondary | ICD-10-CM | POA: Diagnosis not present

## 2020-10-06 DIAGNOSIS — J811 Chronic pulmonary edema: Secondary | ICD-10-CM | POA: Diagnosis not present

## 2020-10-06 DIAGNOSIS — R1310 Dysphagia, unspecified: Secondary | ICD-10-CM | POA: Diagnosis present

## 2020-10-06 DIAGNOSIS — F418 Other specified anxiety disorders: Secondary | ICD-10-CM | POA: Diagnosis not present

## 2020-10-06 DIAGNOSIS — R7881 Bacteremia: Secondary | ICD-10-CM | POA: Diagnosis not present

## 2020-10-06 DIAGNOSIS — I700 Atherosclerosis of aorta: Secondary | ICD-10-CM | POA: Diagnosis present

## 2020-10-06 DIAGNOSIS — Z79890 Hormone replacement therapy (postmenopausal): Secondary | ICD-10-CM | POA: Diagnosis not present

## 2020-10-06 DIAGNOSIS — I6523 Occlusion and stenosis of bilateral carotid arteries: Secondary | ICD-10-CM | POA: Diagnosis not present

## 2020-10-06 DIAGNOSIS — I4891 Unspecified atrial fibrillation: Secondary | ICD-10-CM | POA: Diagnosis not present

## 2020-10-06 DIAGNOSIS — Z87891 Personal history of nicotine dependence: Secondary | ICD-10-CM

## 2020-10-06 DIAGNOSIS — Z23 Encounter for immunization: Secondary | ICD-10-CM | POA: Diagnosis not present

## 2020-10-06 DIAGNOSIS — G8194 Hemiplegia, unspecified affecting left nondominant side: Secondary | ICD-10-CM | POA: Diagnosis present

## 2020-10-06 DIAGNOSIS — R29818 Other symptoms and signs involving the nervous system: Secondary | ICD-10-CM | POA: Diagnosis not present

## 2020-10-06 DIAGNOSIS — R0689 Other abnormalities of breathing: Secondary | ICD-10-CM | POA: Diagnosis not present

## 2020-10-06 DIAGNOSIS — N1832 Chronic kidney disease, stage 3b: Secondary | ICD-10-CM | POA: Diagnosis present

## 2020-10-06 DIAGNOSIS — R1312 Dysphagia, oropharyngeal phase: Secondary | ICD-10-CM | POA: Diagnosis not present

## 2020-10-06 DIAGNOSIS — J810 Acute pulmonary edema: Secondary | ICD-10-CM | POA: Diagnosis not present

## 2020-10-06 DIAGNOSIS — I499 Cardiac arrhythmia, unspecified: Secondary | ICD-10-CM | POA: Diagnosis not present

## 2020-10-06 DIAGNOSIS — I4811 Longstanding persistent atrial fibrillation: Secondary | ICD-10-CM | POA: Diagnosis not present

## 2020-10-06 DIAGNOSIS — I651 Occlusion and stenosis of basilar artery: Secondary | ICD-10-CM | POA: Diagnosis not present

## 2020-10-06 DIAGNOSIS — M1990 Unspecified osteoarthritis, unspecified site: Secondary | ICD-10-CM | POA: Diagnosis not present

## 2020-10-06 DIAGNOSIS — Z79899 Other long term (current) drug therapy: Secondary | ICD-10-CM

## 2020-10-06 DIAGNOSIS — J9601 Acute respiratory failure with hypoxia: Secondary | ICD-10-CM | POA: Diagnosis not present

## 2020-10-06 DIAGNOSIS — Z8249 Family history of ischemic heart disease and other diseases of the circulatory system: Secondary | ICD-10-CM

## 2020-10-06 DIAGNOSIS — H9190 Unspecified hearing loss, unspecified ear: Secondary | ICD-10-CM | POA: Diagnosis present

## 2020-10-06 DIAGNOSIS — I4819 Other persistent atrial fibrillation: Secondary | ICD-10-CM | POA: Diagnosis not present

## 2020-10-06 DIAGNOSIS — I429 Cardiomyopathy, unspecified: Secondary | ICD-10-CM | POA: Diagnosis present

## 2020-10-06 DIAGNOSIS — J90 Pleural effusion, not elsewhere classified: Secondary | ICD-10-CM | POA: Diagnosis not present

## 2020-10-06 DIAGNOSIS — R29810 Facial weakness: Secondary | ICD-10-CM | POA: Diagnosis present

## 2020-10-06 DIAGNOSIS — I5023 Acute on chronic systolic (congestive) heart failure: Secondary | ICD-10-CM | POA: Diagnosis not present

## 2020-10-06 DIAGNOSIS — I69391 Dysphagia following cerebral infarction: Secondary | ICD-10-CM | POA: Diagnosis not present

## 2020-10-06 DIAGNOSIS — I340 Nonrheumatic mitral (valve) insufficiency: Secondary | ICD-10-CM | POA: Diagnosis not present

## 2020-10-06 DIAGNOSIS — R4701 Aphasia: Secondary | ICD-10-CM | POA: Diagnosis present

## 2020-10-06 DIAGNOSIS — Z8673 Personal history of transient ischemic attack (TIA), and cerebral infarction without residual deficits: Secondary | ICD-10-CM

## 2020-10-06 DIAGNOSIS — Z808 Family history of malignant neoplasm of other organs or systems: Secondary | ICD-10-CM

## 2020-10-06 DIAGNOSIS — I491 Atrial premature depolarization: Secondary | ICD-10-CM | POA: Diagnosis not present

## 2020-10-06 DIAGNOSIS — I129 Hypertensive chronic kidney disease with stage 1 through stage 4 chronic kidney disease, or unspecified chronic kidney disease: Secondary | ICD-10-CM | POA: Diagnosis not present

## 2020-10-06 DIAGNOSIS — I5021 Acute systolic (congestive) heart failure: Secondary | ICD-10-CM | POA: Diagnosis not present

## 2020-10-06 DIAGNOSIS — E1165 Type 2 diabetes mellitus with hyperglycemia: Secondary | ICD-10-CM | POA: Diagnosis present

## 2020-10-06 DIAGNOSIS — R0902 Hypoxemia: Secondary | ICD-10-CM | POA: Diagnosis not present

## 2020-10-06 DIAGNOSIS — G460 Middle cerebral artery syndrome: Secondary | ICD-10-CM | POA: Diagnosis not present

## 2020-10-06 DIAGNOSIS — D72829 Elevated white blood cell count, unspecified: Secondary | ICD-10-CM | POA: Diagnosis not present

## 2020-10-06 DIAGNOSIS — Z85818 Personal history of malignant neoplasm of other sites of lip, oral cavity, and pharynx: Secondary | ICD-10-CM

## 2020-10-06 DIAGNOSIS — I509 Heart failure, unspecified: Secondary | ICD-10-CM | POA: Diagnosis not present

## 2020-10-06 DIAGNOSIS — E872 Acidosis: Secondary | ICD-10-CM | POA: Diagnosis not present

## 2020-10-06 DIAGNOSIS — I63231 Cerebral infarction due to unspecified occlusion or stenosis of right carotid arteries: Secondary | ICD-10-CM | POA: Diagnosis not present

## 2020-10-06 DIAGNOSIS — I639 Cerebral infarction, unspecified: Secondary | ICD-10-CM | POA: Diagnosis present

## 2020-10-06 DIAGNOSIS — I350 Nonrheumatic aortic (valve) stenosis: Secondary | ICD-10-CM | POA: Diagnosis not present

## 2020-10-06 DIAGNOSIS — I10 Essential (primary) hypertension: Secondary | ICD-10-CM | POA: Diagnosis present

## 2020-10-06 DIAGNOSIS — I69952 Hemiplegia and hemiparesis following unspecified cerebrovascular disease affecting left dominant side: Secondary | ICD-10-CM | POA: Diagnosis not present

## 2020-10-06 DIAGNOSIS — I255 Ischemic cardiomyopathy: Secondary | ICD-10-CM | POA: Diagnosis not present

## 2020-10-06 DIAGNOSIS — I69354 Hemiplegia and hemiparesis following cerebral infarction affecting left non-dominant side: Secondary | ICD-10-CM | POA: Diagnosis not present

## 2020-10-06 DIAGNOSIS — I361 Nonrheumatic tricuspid (valve) insufficiency: Secondary | ICD-10-CM | POA: Diagnosis not present

## 2020-10-06 DIAGNOSIS — A409 Streptococcal sepsis, unspecified: Secondary | ICD-10-CM | POA: Diagnosis present

## 2020-10-06 DIAGNOSIS — I5022 Chronic systolic (congestive) heart failure: Secondary | ICD-10-CM | POA: Diagnosis not present

## 2020-10-06 DIAGNOSIS — K921 Melena: Secondary | ICD-10-CM | POA: Diagnosis not present

## 2020-10-06 DIAGNOSIS — R7989 Other specified abnormal findings of blood chemistry: Secondary | ICD-10-CM | POA: Diagnosis present

## 2020-10-06 DIAGNOSIS — I4821 Permanent atrial fibrillation: Secondary | ICD-10-CM | POA: Diagnosis present

## 2020-10-06 DIAGNOSIS — R471 Dysarthria and anarthria: Secondary | ICD-10-CM | POA: Diagnosis not present

## 2020-10-06 DIAGNOSIS — D62 Acute posthemorrhagic anemia: Secondary | ICD-10-CM | POA: Diagnosis present

## 2020-10-06 DIAGNOSIS — Z7901 Long term (current) use of anticoagulants: Secondary | ICD-10-CM

## 2020-10-06 DIAGNOSIS — I6621 Occlusion and stenosis of right posterior cerebral artery: Secondary | ICD-10-CM | POA: Diagnosis not present

## 2020-10-06 DIAGNOSIS — I351 Nonrheumatic aortic (valve) insufficiency: Secondary | ICD-10-CM | POA: Diagnosis not present

## 2020-10-06 DIAGNOSIS — R29719 NIHSS score 19: Secondary | ICD-10-CM | POA: Diagnosis present

## 2020-10-06 DIAGNOSIS — A491 Streptococcal infection, unspecified site: Secondary | ICD-10-CM | POA: Diagnosis not present

## 2020-10-06 DIAGNOSIS — Z9889 Other specified postprocedural states: Secondary | ICD-10-CM | POA: Diagnosis not present

## 2020-10-06 DIAGNOSIS — K219 Gastro-esophageal reflux disease without esophagitis: Secondary | ICD-10-CM | POA: Diagnosis present

## 2020-10-06 DIAGNOSIS — R0989 Other specified symptoms and signs involving the circulatory and respiratory systems: Secondary | ICD-10-CM | POA: Diagnosis not present

## 2020-10-06 DIAGNOSIS — R6520 Severe sepsis without septic shock: Secondary | ICD-10-CM | POA: Diagnosis not present

## 2020-10-06 DIAGNOSIS — R52 Pain, unspecified: Secondary | ICD-10-CM | POA: Diagnosis not present

## 2020-10-06 DIAGNOSIS — I69322 Dysarthria following cerebral infarction: Secondary | ICD-10-CM | POA: Diagnosis not present

## 2020-10-06 DIAGNOSIS — I6782 Cerebral ischemia: Secondary | ICD-10-CM | POA: Diagnosis not present

## 2020-10-06 DIAGNOSIS — Z9862 Peripheral vascular angioplasty status: Secondary | ICD-10-CM | POA: Diagnosis not present

## 2020-10-06 DIAGNOSIS — Z978 Presence of other specified devices: Secondary | ICD-10-CM | POA: Diagnosis not present

## 2020-10-06 DIAGNOSIS — Z7982 Long term (current) use of aspirin: Secondary | ICD-10-CM

## 2020-10-06 DIAGNOSIS — F0390 Unspecified dementia without behavioral disturbance: Secondary | ICD-10-CM | POA: Diagnosis present

## 2020-10-06 DIAGNOSIS — I6389 Other cerebral infarction: Secondary | ICD-10-CM | POA: Diagnosis not present

## 2020-10-06 DIAGNOSIS — R7303 Prediabetes: Secondary | ICD-10-CM | POA: Diagnosis not present

## 2020-10-06 DIAGNOSIS — N1831 Chronic kidney disease, stage 3a: Secondary | ICD-10-CM | POA: Diagnosis not present

## 2020-10-06 DIAGNOSIS — I959 Hypotension, unspecified: Secondary | ICD-10-CM | POA: Diagnosis not present

## 2020-10-06 HISTORY — PX: RADIOLOGY WITH ANESTHESIA: SHX6223

## 2020-10-06 HISTORY — PX: IR INTRAVSC STENT CERV CAROTID W/EMB-PROT MOD SED INCL ANGIO: IMG2303

## 2020-10-06 HISTORY — PX: IR US GUIDE VASC ACCESS LEFT: IMG2389

## 2020-10-06 HISTORY — PX: IR PERCUTANEOUS ART THROMBECTOMY/INFUSION INTRACRANIAL INC DIAG ANGIO: IMG6087

## 2020-10-06 HISTORY — PX: IR US GUIDE VASC ACCESS RIGHT: IMG2390

## 2020-10-06 LAB — CBC WITH DIFFERENTIAL/PLATELET
Abs Immature Granulocytes: 0.16 10*3/uL — ABNORMAL HIGH (ref 0.00–0.07)
Basophils Absolute: 0 10*3/uL (ref 0.0–0.1)
Basophils Relative: 0 %
Eosinophils Absolute: 0 10*3/uL (ref 0.0–0.5)
Eosinophils Relative: 0 %
HCT: 30.1 % — ABNORMAL LOW (ref 39.0–52.0)
Hemoglobin: 9.1 g/dL — ABNORMAL LOW (ref 13.0–17.0)
Immature Granulocytes: 1 %
Lymphocytes Relative: 2 %
Lymphs Abs: 0.3 10*3/uL — ABNORMAL LOW (ref 0.7–4.0)
MCH: 23 pg — ABNORMAL LOW (ref 26.0–34.0)
MCHC: 30.2 g/dL (ref 30.0–36.0)
MCV: 76.2 fL — ABNORMAL LOW (ref 80.0–100.0)
Monocytes Absolute: 1.6 10*3/uL — ABNORMAL HIGH (ref 0.1–1.0)
Monocytes Relative: 9 %
Neutro Abs: 15 10*3/uL — ABNORMAL HIGH (ref 1.7–7.7)
Neutrophils Relative %: 88 %
Platelets: 242 10*3/uL (ref 150–400)
RBC: 3.95 MIL/uL — ABNORMAL LOW (ref 4.22–5.81)
RDW: 18.2 % — ABNORMAL HIGH (ref 11.5–15.5)
WBC: 17.1 10*3/uL — ABNORMAL HIGH (ref 4.0–10.5)
nRBC: 0 % (ref 0.0–0.2)

## 2020-10-06 LAB — COMPREHENSIVE METABOLIC PANEL
ALT: 18 U/L (ref 0–44)
AST: 29 U/L (ref 15–41)
Albumin: 3.7 g/dL (ref 3.5–5.0)
Alkaline Phosphatase: 62 U/L (ref 38–126)
Anion gap: 16 — ABNORMAL HIGH (ref 5–15)
BUN: 37 mg/dL — ABNORMAL HIGH (ref 8–23)
CO2: 20 mmol/L — ABNORMAL LOW (ref 22–32)
Calcium: 9.1 mg/dL (ref 8.9–10.3)
Chloride: 102 mmol/L (ref 98–111)
Creatinine, Ser: 1.49 mg/dL — ABNORMAL HIGH (ref 0.61–1.24)
GFR, Estimated: 44 mL/min — ABNORMAL LOW (ref >60)
Glucose, Bld: 172 mg/dL — ABNORMAL HIGH (ref 70–99)
Potassium: 3.8 mmol/L (ref 3.5–5.1)
Sodium: 138 mmol/L (ref 135–145)
Total Bilirubin: 1 mg/dL (ref 0.3–1.2)
Total Protein: 7.8 g/dL (ref 6.5–8.1)

## 2020-10-06 LAB — URINALYSIS, ROUTINE W REFLEX MICROSCOPIC
Bilirubin Urine: NEGATIVE
Glucose, UA: NEGATIVE mg/dL
Ketones, ur: NEGATIVE mg/dL
Leukocytes,Ua: NEGATIVE
Nitrite: NEGATIVE
Protein, ur: 100 mg/dL — AB
RBC / HPF: >50 RBC/hpf — ABNORMAL HIGH (ref 0–5)
Specific Gravity, Urine: 1.017 (ref 1.005–1.030)
pH: 5 (ref 5.0–8.0)

## 2020-10-06 LAB — I-STAT CHEM 8, ED
BUN: 32 mg/dL — ABNORMAL HIGH (ref 8–23)
Calcium, Ion: 1.16 mmol/L (ref 1.15–1.40)
Chloride: 109 mmol/L (ref 98–111)
Creatinine, Ser: 1.3 mg/dL — ABNORMAL HIGH (ref 0.61–1.24)
Glucose, Bld: 145 mg/dL — ABNORMAL HIGH (ref 70–99)
HCT: 28 % — ABNORMAL LOW (ref 39.0–52.0)
Hemoglobin: 9.5 g/dL — ABNORMAL LOW (ref 13.0–17.0)
Potassium: 3.7 mmol/L (ref 3.5–5.1)
Sodium: 142 mmol/L (ref 135–145)
TCO2: 19 mmol/L — ABNORMAL LOW (ref 22–32)

## 2020-10-06 LAB — RESPIRATORY PANEL BY RT PCR (FLU A&B, COVID)
Influenza A by PCR: NEGATIVE
Influenza B by PCR: NEGATIVE
SARS Coronavirus 2 by RT PCR: NEGATIVE

## 2020-10-06 LAB — BLOOD GAS, VENOUS
Acid-base deficit: 4.1 mmol/L — ABNORMAL HIGH (ref 0.0–2.0)
Bicarbonate: 20.7 mmol/L (ref 20.0–28.0)
FIO2: 28
O2 Saturation: 79.7 %
Patient temperature: 37
pCO2, Ven: 38.5 mmHg — ABNORMAL LOW (ref 44.0–60.0)
pH, Ven: 7.348 (ref 7.250–7.430)
pO2, Ven: 54 mmHg — ABNORMAL HIGH (ref 32.0–45.0)

## 2020-10-06 LAB — LACTIC ACID, PLASMA
Lactic Acid, Venous: 5 mmol/L (ref 0.5–1.9)
Lactic Acid, Venous: 4.8 mmol/L (ref 0.5–1.9)

## 2020-10-06 LAB — CBG MONITORING, ED: Glucose-Capillary: 132 mg/dL — ABNORMAL HIGH (ref 70–99)

## 2020-10-06 LAB — BRAIN NATRIURETIC PEPTIDE: B Natriuretic Peptide: 1097 pg/mL — ABNORMAL HIGH (ref 0.0–100.0)

## 2020-10-06 LAB — PROTIME-INR
INR: 1.5 — ABNORMAL HIGH (ref 0.8–1.2)
Prothrombin Time: 17.6 seconds — ABNORMAL HIGH (ref 11.4–15.2)

## 2020-10-06 LAB — APTT: aPTT: 42 seconds — ABNORMAL HIGH (ref 24–36)

## 2020-10-06 LAB — ETHANOL: Alcohol, Ethyl (B): <10 mg/dL (ref <10)

## 2020-10-06 SURGERY — IR WITH ANESTHESIA
Anesthesia: General

## 2020-10-06 MED ORDER — SODIUM CHLORIDE 0.9 % IV SOLN
2.0000 g | Freq: Once | INTRAVENOUS | Status: AC
  Administered 2020-10-06: 2 g via INTRAVENOUS
  Filled 2020-10-06: qty 2

## 2020-10-06 MED ORDER — ASPIRIN 81 MG PO CHEW: 81.0000 mg | CHEWABLE_TABLET | Freq: Every day | ORAL | Status: DC

## 2020-10-06 MED ORDER — CEFAZOLIN SODIUM-DEXTROSE 2-4 GM/100ML-% IV SOLN
INTRAVENOUS | Status: AC
  Filled 2020-10-06: qty 100

## 2020-10-06 MED ORDER — VANCOMYCIN HCL 750 MG/150ML IV SOLN
750.0000 mg | INTRAVENOUS | Status: DC
  Filled 2020-10-06 (×3): qty 150

## 2020-10-06 MED ORDER — CANGRELOR BOLUS VIA INFUSION
15.0000 ug/kg | Freq: Once | INTRAVENOUS | Status: AC
  Administered 2020-10-06: 974 ug via INTRAVENOUS
  Filled 2020-10-06: qty 974

## 2020-10-06 MED ORDER — IOHEXOL 350 MG/ML SOLN
100.0000 mL | Freq: Once | INTRAVENOUS | Status: AC | PRN
  Administered 2020-10-06: 100 mL via INTRAVENOUS

## 2020-10-06 MED ORDER — CANGRELOR TETRASODIUM 50 MG IV SOLR
INTRAVENOUS | Status: AC
  Filled 2020-10-06: qty 50

## 2020-10-06 MED ORDER — ASPIRIN 325 MG PO TABS
ORAL_TABLET | ORAL | Status: AC
  Administered 2020-10-06: 650 mg via OROGASTRIC
  Filled 2020-10-06: qty 2

## 2020-10-06 MED ORDER — VERAPAMIL HCL 2.5 MG/ML IV SOLN
INTRAVENOUS | Status: AC
  Administered 2020-10-06: 2.5 mg via INTRA_ARTERIAL
  Filled 2020-10-06: qty 2

## 2020-10-06 MED ORDER — HEPARIN SODIUM (PORCINE) 1000 UNIT/ML IJ SOLN
INTRAMUSCULAR | Status: AC
  Administered 2020-10-06: 2000 [IU] via INTRA_ARTERIAL
  Filled 2020-10-06: qty 1

## 2020-10-06 MED ORDER — LACTATED RINGERS IV SOLN: INTRAVENOUS | Status: DC

## 2020-10-06 MED ORDER — ROCURONIUM BROMIDE 10 MG/ML (PF) SYRINGE
PREFILLED_SYRINGE | INTRAVENOUS | Status: DC | PRN
  Administered 2020-10-06 (×2): 20 mg via INTRAVENOUS
  Administered 2020-10-06: 50 mg via INTRAVENOUS
  Administered 2020-10-06: 20 mg via INTRAVENOUS

## 2020-10-06 MED ORDER — SODIUM CHLORIDE 0.9 % IV SOLN: 2.0000 g | INTRAVENOUS | Status: DC

## 2020-10-06 MED ORDER — PROPOFOL 500 MG/50ML IV EMUL
INTRAVENOUS | Status: DC | PRN
  Administered 2020-10-06: 50 ug/kg/min via INTRAVENOUS

## 2020-10-06 MED ORDER — NITROGLYCERIN 1 MG/10 ML FOR IR/CATH LAB
INTRA_ARTERIAL | Status: AC
  Administered 2020-10-06: 200 ug via INTRA_ARTERIAL
  Filled 2020-10-06: qty 10

## 2020-10-06 MED ORDER — ETOMIDATE 2 MG/ML IV SOLN
INTRAVENOUS | Status: DC | PRN
  Administered 2020-10-06: 12 mg via INTRAVENOUS

## 2020-10-06 MED ORDER — LABETALOL HCL 5 MG/ML IV SOLN
INTRAVENOUS | Status: DC | PRN
  Administered 2020-10-06: 5 mg via INTRAVENOUS

## 2020-10-06 MED ORDER — EPTIFIBATIDE 20 MG/10ML IV SOLN
INTRAVENOUS | Status: AC
  Filled 2020-10-06: qty 10

## 2020-10-06 MED ORDER — IOHEXOL 300 MG/ML  SOLN
150.0000 mL | Freq: Once | INTRAMUSCULAR | Status: AC | PRN
  Administered 2020-10-06: 60 mL via INTRA_ARTERIAL

## 2020-10-06 MED ORDER — PHENYLEPHRINE 40 MCG/ML (10ML) SYRINGE FOR IV PUSH (FOR BLOOD PRESSURE SUPPORT)
PREFILLED_SYRINGE | INTRAVENOUS | Status: DC | PRN
  Administered 2020-10-06: 80 ug via INTRAVENOUS
  Administered 2020-10-06: 120 ug via INTRAVENOUS
  Administered 2020-10-06 (×2): 80 ug via INTRAVENOUS

## 2020-10-06 MED ORDER — PROPOFOL 10 MG/ML IV BOLUS
INTRAVENOUS | Status: DC | PRN
  Administered 2020-10-06: 50 mg via INTRAVENOUS

## 2020-10-06 MED ORDER — TIROFIBAN HCL IN NACL 5-0.9 MG/100ML-% IV SOLN
INTRAVENOUS | Status: AC
  Filled 2020-10-06: qty 100

## 2020-10-06 MED ORDER — LIDOCAINE 2% (20 MG/ML) 5 ML SYRINGE
INTRAMUSCULAR | Status: DC | PRN
  Administered 2020-10-06: 100 mg via INTRAVENOUS

## 2020-10-06 MED ORDER — SODIUM CHLORIDE 0.9 % IV BOLUS
500.0000 mL | Freq: Once | INTRAVENOUS | Status: AC
  Administered 2020-10-06: 500 mL via INTRAVENOUS

## 2020-10-06 MED ORDER — TICAGRELOR 90 MG PO TABS
ORAL_TABLET | ORAL | Status: AC
  Administered 2020-10-06: 180 mg via OROGASTRIC
  Filled 2020-10-06: qty 2

## 2020-10-06 MED ORDER — ACETAMINOPHEN 650 MG RE SUPP
650.0000 mg | Freq: Once | RECTAL | Status: AC
  Administered 2020-10-06: 650 mg via RECTAL
  Filled 2020-10-06: qty 1

## 2020-10-06 MED ORDER — TICAGRELOR 90 MG PO TABS: 90.0000 mg | ORAL_TABLET | Freq: Two times a day (BID) | ORAL | Status: DC

## 2020-10-06 MED ORDER — LACTATED RINGERS IV SOLN: INTRAVENOUS | Status: DC | PRN

## 2020-10-06 MED ORDER — CLOPIDOGREL BISULFATE 300 MG PO TABS
ORAL_TABLET | ORAL | Status: AC
  Filled 2020-10-06: qty 1

## 2020-10-06 MED ORDER — IOHEXOL 300 MG/ML  SOLN
50.0000 mL | Freq: Once | INTRAMUSCULAR | Status: AC | PRN
  Administered 2020-10-06: 25 mL

## 2020-10-06 MED ORDER — SUCCINYLCHOLINE CHLORIDE 200 MG/10ML IV SOSY
PREFILLED_SYRINGE | INTRAVENOUS | Status: DC | PRN
  Administered 2020-10-06: 100 mg via INTRAVENOUS

## 2020-10-06 MED ORDER — SODIUM CHLORIDE 0.9 % IV SOLN: INTRAVENOUS | Status: DC | PRN

## 2020-10-06 MED ORDER — SODIUM CHLORIDE 0.9 % IV BOLUS
1500.0000 mL | Freq: Once | INTRAVENOUS | Status: AC
  Administered 2020-10-06: 1500 mL via INTRAVENOUS

## 2020-10-06 MED ORDER — SODIUM CHLORIDE 0.9 % IV SOLN
2.0000 ug/kg/min | INTRAVENOUS | Status: DC
  Administered 2020-10-06 – 2020-10-07 (×2): 2 ug/kg/min via INTRAVENOUS
  Filled 2020-10-06: qty 50

## 2020-10-06 MED ORDER — METRONIDAZOLE 500 MG PO TABS
500.0000 mg | ORAL_TABLET | Freq: Three times a day (TID) | ORAL | Status: DC
  Administered 2020-10-06: 500 mg via ORAL
  Filled 2020-10-06 (×2): qty 1

## 2020-10-06 MED ORDER — EPHEDRINE SULFATE-NACL 50-0.9 MG/10ML-% IV SOSY
PREFILLED_SYRINGE | INTRAVENOUS | Status: DC | PRN
  Administered 2020-10-06: 5 mg via INTRAVENOUS
  Administered 2020-10-06: 2.5 mg via INTRAVENOUS
  Administered 2020-10-06: 5 mg via INTRAVENOUS

## 2020-10-06 MED ORDER — PHENYLEPHRINE HCL-NACL 10-0.9 MG/250ML-% IV SOLN
INTRAVENOUS | Status: DC | PRN
  Administered 2020-10-06: 25 ug/min via INTRAVENOUS

## 2020-10-06 MED ORDER — ACETAMINOPHEN 325 MG PO TABS: 650.0000 mg | ORAL_TABLET | Freq: Once | ORAL | Status: DC

## 2020-10-06 MED ORDER — ASPIRIN 81 MG PO CHEW
CHEWABLE_TABLET | ORAL | Status: AC
  Filled 2020-10-06: qty 1

## 2020-10-06 MED ORDER — VANCOMYCIN HCL 1500 MG/300ML IV SOLN
1500.0000 mg | Freq: Once | INTRAVENOUS | Status: AC
  Administered 2020-10-06: 1500 mg via INTRAVENOUS
  Filled 2020-10-06: qty 300

## 2020-10-06 MED ORDER — IOHEXOL 300 MG/ML  SOLN
150.0000 mL | Freq: Once | INTRAMUSCULAR | Status: AC | PRN
  Administered 2020-10-06: 75 mL

## 2020-10-06 NOTE — ED Provider Notes (Signed)
[facility] EMERGENCY DEPARTMENT Provider Note   CSN: [id number] Arrival date & time: 10/06/20  0941     History Chief Complaint  Patient presents with  . Shortness of Breath  . Back Pain    Fernando [name] is a 86 y.o. male.  HPI Patient seen by me in hallway bed, at 10:05 AM.  This time he is alert, and unable to sit up because of weakness.  He is unable to give me any history.  He presented by EMS for evaluation of "not right."  There is also report of shortness of breath and back pain.  He apparently lives with family members.  Level 5 caveat-altered mental status    Past Medical History:  Diagnosis Date  . Arthritis   . Atrial fibrillation ([place])   . Carotid artery disease ([place])    Dr. Kellie Simmering - s/p left CEA  . Coronary artery disease   . Essential hypertension, benign   . Hyperlipidemia   . 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 This mass was initially felt to be 1.7 cm at the time of presentation but had enlarged significantly to 4 cm clinically at the time of surgery. Pathologically it was 3.4 cm. A lymph node deep to the parotid was enlarged clinically at the time of surgery but was negative   . 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 Hemiparesis of left dominant side ([place]) 07/02/2020  . Normocytic anemia 07/02/2020  . Hypothyroidism 07/02/2020  . GI bleed 03/27/2018  . CAD (coronary artery disease), native coronary artery 03/27/2018  . Acute blood loss anemia 03/27/2018  . HLD (hyperlipidemia) 03/27/2018  . Depression 03/27/2018  . Esophageal dysphagia   . History of radiation therapy   . Symptomatic anemia   . Weakness   . Carotid stenosis 09/27/2014  . Aftercare following surgery of the circulatory system 09/27/2014  . Squamous cell carcinoma of parotid  ([place]) 01/19/2013  . Pacemaker-St.[employer]ude 10/02/2012  . Atrial fibrillation ([place]) 09/09/2012  . Essential hypertension, benign 09/09/2012  . Tachy-brady syndrome ([place]) 09/09/2012  . Occlusion and stenosis of carotid artery without mention of cerebral infarction 03/19/2012    Past Surgical History:  Procedure Laterality Date  . CAROTID ENDARTERECTOMY Left Jan. 3, 2009   CE  . CATARACT EXTRACTION W/ INTRAOCULAR LENS IMPLANT     Bilateral  . ESOPHAGOGASTRODUODENOSCOPY N/A 03/29/2018   Procedure: ESOPHAGOGASTRODUODENOSCOPY (EGD);  Surgeon: Danie Binder, MD;  Location[facility];  Service: Endoscopy;  Laterality: N/A;  . EYE SURGERY    . INSERT / REPLACE / REMOVE PACEMAKER    . PACEMAKER INSERTION  10-01-2012  . PARATHYROIDECTOMY  10/28/2012  . PAROTIDECTOMY  10/28/2012   Procedure: PAROTIDECTOMY;  Surgeon: Ascencion Dike, MD;  Location: [facility];  Service: ENT;  Laterality: Right;  Total Parotidectomy  . PERMANENT PACEMAKER INSERTION N/A 10/01/2012   Procedure: PERMANENT PACEMAKER INSERTION;  Surgeon: Evans Lance, MD;  Location: [facility] CATH LAB;  Service: Cardiovascular;  Laterality: N/A;       Family History  Problem Relation Age of Onset  . Heart disease Sister   . Heart attack Sister   . Cirrhosis Mother   . Cancer Brother        brain cancer  . Cancer Sister        stomach cancer  Social History   Tobacco Use  . Smoking status: Former Smoker    Types: Cigarettes    Quit date: 11/25/1972    Years since quitting: 47.8  . Smokeless tobacco: Never Used  Vaping Use  . Vaping Use: Never used  Substance Use Topics  . Alcohol use: Yes    Alcohol/week: 1.0 standard drink    Types: 1 Cans of beer per week    Comment: daily  . Drug use: No    Home Medications Prior to Admission medications   Medication Sig Start Date End Date Taking? Authorizing Provider  atorvastatin (LIPITOR) 40 MG tablet Take 40 mg by mouth every morning.     [provider]  levothyroxine  (SYNTHROID) 50 MCG tablet Take 1 tablet (50 mcg total) by mouth daily before breakfast. 12/26/15   Penland, Kelby Fam, MD  metoprolol tartrate (LOPRESSOR) 25 MG tablet TAKE 1 TABLET TWICE A DAY Patient taking differently: Take 25 mg by mouth 2 (two) times daily.  12/02/13   Evans Lance, MD  ondansetron (ZOFRAN ODT) 4 MG disintegrating tablet 4mg  ODT q4 hours prn nausea/vomit 05/08/20   Milton Ferguson, MD  pantoprazole (PROTONIX) 40 MG tablet Take 1 tablet (40 mg total) by mouth 2 (two) times daily before a meal. Twice a day for 3 months and then start taking daily. 03/29/18   Barton Dubois, MD  XARELTO 15 MG TABS tablet TAKE 1 TABLET BY MOUTH ONCE DAILY WITH SUPPER Patient taking differently: Take 15 mg by mouth daily with supper.  04/25/20   Evans Lance, MD    Allergies    Patient has no known allergies.  Review of Systems   Review of Systems  Unable to perform ROS: Mental status change    Physical Exam Updated Vital Signs BP 110/80   Pulse 61   Temp 97.9 F (36.6 C) (Oral)   Resp (!) 24   Wt 64.9 kg   SpO2 100%   BMI 24.55 kg/m   Physical Exam Vitals and nursing note reviewed.  Constitutional:      General: He is not in acute distress.    Appearance: He is well-developed. He is ill-appearing. He is not toxic-appearing or diaphoretic.  HENT:     Head: Normocephalic and atraumatic.     Right Ear: External ear normal.     Left Ear: External ear normal.  Eyes:     Conjunctiva/sclera: Conjunctivae normal.     Pupils: Pupils are equal, round, and reactive to light.  Neck:     Trachea: Phonation normal.  Cardiovascular:     Rate and Rhythm: Regular rhythm. Tachycardia present.     Heart sounds: Normal heart sounds. No murmur heard.   Pulmonary:     Effort: Pulmonary effort is normal. No respiratory distress.     Breath sounds: No stridor. Rhonchi present.  Abdominal:     General: There is no distension.     Palpations: Abdomen is soft.     Tenderness: There is no  abdominal tenderness.  Musculoskeletal:        General: No swelling or tenderness. Normal range of motion.     Cervical back: Normal range of motion and neck supple.     Right lower leg: No edema.     Left lower leg: No edema.  Skin:    General: Skin is warm and dry.  Neurological:     Mental Status: He is alert.     Cranial Nerves: No cranial nerve deficit.  Motor: No abnormal muscle tone.     Coordination: Coordination normal.  Psychiatric:        Mood and Affect: Mood normal.        Behavior: Behavior normal.     ED Results / Procedures / Treatments   Labs (all labs ordered are listed, but only abnormal results are displayed) Labs Reviewed  BLOOD GAS, VENOUS - Abnormal; Notable for the following components:      Result Value   pCO2, Ven 38.5 (*)    pO2, Ven 54.0 (*)    Acid-base deficit 4.1 (*)    All other components within normal limits  COMPREHENSIVE METABOLIC PANEL - Abnormal; Notable for the following components:   CO2 20 (*)    Glucose, Bld 172 (*)    BUN 37 (*)    Creatinine, Ser 1.49 (*)    GFR, Estimated 44 (*)    Anion gap 16 (*)    All other components within normal limits  LACTIC ACID, PLASMA - Abnormal; Notable for the following components:   Lactic Acid, Venous 5.0 (*)    All other components within normal limits  CBC WITH DIFFERENTIAL/PLATELET - Abnormal; Notable for the following components:   WBC 17.1 (*)    RBC 3.95 (*)    Hemoglobin 9.1 (*)    HCT 30.1 (*)    MCV 76.2 (*)    MCH 23.0 (*)    RDW 18.2 (*)    Neutro Abs 15.0 (*)    Lymphs Abs 0.3 (*)    Monocytes Absolute 1.6 (*)    Abs Immature Granulocytes 0.16 (*)    All other components within normal limits  RESPIRATORY PANEL BY RT PCR (FLU A&B, COVID)  CULTURE, BLOOD (ROUTINE X 2)  CULTURE, BLOOD (ROUTINE X 2)  URINALYSIS, ROUTINE W REFLEX MICROSCOPIC  LACTIC ACID, PLASMA    EKG EKG Interpretation  Date/Time:  Friday October 06 2020 09:48:17 EST Ventricular Rate:  78 PR  Interval:    QRS Duration: 82 QT Interval:  382 QTC Calculation: 435 R Axis:   14 Text Interpretation: Atrial fibrillation with premature ventricular or aberrantly conducted complexes Anterior infarct , age undetermined ST & T wave abnormality, consider inferolateral ischemia Abnormal ECG Since last tracing now with recurrent atrial fibrillation prior EKG 07/02/20 appears to be V paced Otherwise no significant change Confirmed by Daleen Bo [phone]) on 10/06/2020 10:05:52 AM   Radiology DG Chest Port 1 View  Result Date: 10/06/2020 CLINICAL DATA:  Shortness of breath, back pain since this morning EXAM: PORTABLE CHEST 1 VIEW COMPARISON:  Portable exam 1207 hours compared to 10/02/2012 FINDINGS: LEFT subclavian sequential transvenous pacemaker leads project at RIGHT atrium and RIGHT ventricle. Enlargement of cardiac silhouette. Mediastinal contours and pulmonary vascularity normal. Atherosclerotic calcification aorta. Slight accentuation of interstitial markings in the mid to lower lungs increased from previous exam, question minimal pulmonary edema or developing chronic interstitial lung disease. No pleural effusion or pneumothorax. Bones demineralized with BILATERAL glenohumeral degenerative changes. IMPRESSION: Enlargement of cardiac silhouette with pulmonary vascular congestion. Increased interstitial markings in the mid to lower lungs question minimal edema or developing chronic interstitial lung disease. Electronically Signed   By: Lavonia Dana M.D.   On: 10/06/2020 12:23    Procedures .Critical Care Performed by: Daleen Bo, MD Authorized by: Daleen Bo, MD   Critical care provider statement:    Critical care time (minutes):  50   Critical care start time:  10/06/2020 10:05 AM   Critical care time was  exclusive of:  Separately billable procedures and treating other patients   Critical care was necessary to treat or prevent imminent or life-threatening deterioration of the  following conditions:  Sepsis   Critical care was time spent personally by me on the following activities:  Blood draw for specimens, development of treatment plan with patient or surrogate, discussions with consultants, evaluation of patient's response to treatment, examination of patient, obtaining history from patient or surrogate, ordering and performing treatments and interventions, ordering and review of laboratory studies, pulse oximetry, re-evaluation of patient's condition, review of old charts and ordering and review of radiographic studies   (including critical care time)  Medications Ordered in ED Medications  lactated ringers infusion (has no administration in time range)  metroNIDAZOLE (FLAGYL) tablet 500 mg (500 mg Oral Given 10/06/20 1331)  vancomycin (VANCOREADY) IVPB 1500 mg/300 mL (has no administration in time range)  sodium chloride 0.9 % bolus 500 mL (0 mLs Intravenous Stopped 10/06/20 1246)  sodium chloride 0.9 % bolus 1,500 mL (1,500 mLs Intravenous New Bag/Given 10/06/20 1246)  ceFEPIme (MAXIPIME) 2 g in sodium chloride 0.9 % 100 mL IVPB (2 g Intravenous New Bag/Given 10/06/20 1331)    ED Course  I have reviewed the triage vital signs and the nursing notes.  Pertinent labs & imaging results that were available during my care of the patient were reviewed by me and considered in my medical decision making (see chart for details).  Clinical Course as of Oct 06 1950  Fri Oct 06, 2020  1200 Abnormal, elevated  Lactic acid, plasma(!!) [EW]  1201 Normal except white count high, RBC indices low, hemoglobin low  CBC with Differential(!) [EW]  1201 Normal except CO2 low, glucose high, BUN high, creatinine high  Comprehensive metabolic panel(!) [EW]  2376 Normal  Respiratory Panel by RT PCR (Flu A&B, Covid) - Nasopharyngeal Swab [EW]  1429 Normal except white count high, hemoglobin low  CBC with Differential(!) [EW]  1429 Elevated  Lactic acid, plasma(!!) [EW]  1429  Normal except CO2 low, glucose high, BUN high, creatinine high  Comprehensive metabolic panel(!) [EW]  2831 Normal except PCO2 low, PO2 high  Blood gas, venous(!) [EW]  1430 Per radiologist, nonspecific interstitial markings primarily at the bases.  DG Chest Port 1 View [EW]  5176 Abnormal catheterized sample containing blood, but no white cells and rare bacteria  Urinalysis, Routine w reflex microscopic Urine, Catheterized(!) [EW]  1549 Improving lactate  Lactic acid, plasma(!!) [EW]  1948 CT HEAD CODE STROKE WO CONTRAST [EW]    Clinical Course User Index [EW] Daleen Bo, MD   MDM Rules/Calculators/A&P                           Patient Vitals for the past 24 hrs:  BP Temp Temp src Pulse Resp SpO2 Weight  10/06/20 1400 110/80 -- -- 61 (!) 24 100 % --  10/06/20 1345 -- -- -- 92 (!) 26 100 % --  10/06/20 1330 (!) 113/98 -- -- 94 (!) 30 95 % --  10/06/20 1315 -- -- -- 76 (!) 40 94 % --  10/06/20 1300 107/71 -- -- 88 (!) 37 95 % 64.9 kg  10/06/20 1245 -- -- -- 95 20 97 % --  10/06/20 1230 (!) 124/59 -- -- (!) 32 (!) 28 94 % --  10/06/20 1215 -- -- -- 94 (!) 35 100 % --  10/06/20 1200 139/90 -- -- 98 (!) 36 97 % --  10/06/20 1145 -- -- -- 95 (!) 43 100 % --  10/06/20 1133 121/85 -- -- 100 (!) 32 97 % --  10/06/20 1130 121/85 -- -- (!) 124 (!) 30 (!) 78 % --  10/06/20 0954 (!) 208/75 97.9 F (36.6 C) Oral (!) 129 (!) 27 91 % --    4:14 PM Reevaluation with update and discussion. After initial assessment and treatment, an updated evaluation reveals he remains confused, and restless.  Son updated on findings and plan. Daleen Bo   Medical Decision Making:  This patient is presenting for evaluation of shortness of breath and altered mental status, which does require a range of treatment options, and is a complaint that involves a high risk of morbidity and mortality. The differential diagnoses include acute infectious process, metabolic disorder, progression of age-related  mental disorder. I decided to review old records, and in summary elderly male living at home, presenting with nonspecific symptoms concerning for acute infectious process.  I obtained additional historical information from son at bedside.  Clinical Laboratory Tests Ordered, included CBC, Metabolic panel, Urinalysis and Lactate, blood culture, VBG, Covid test. Review indicates elevated lactate and white count concerning for sepsis, with return of these, sepsis protocol initiated.Also elevated BUN and creatinine. Radiologic Tests Ordered, included chest x-ray.  I independently Visualized: Radiographic images, which show nonspecific interstitial changes    Critical Interventions-clinical evaluation, laboratory testing, empiric antibiotics initiated, IV fluid treatment high-volume bolus, observation and reassessment  After These Interventions, the Patient was reevaluated and was found persistent confusion, and generalized weakness, without focality.  Patient's son arrived and states he began feeling ill yesterday and complained of some back pain.  Subsequent to that the patient had gradually progressive weakness and confusion, whereupon the son called for EMS evaluation today.  Patient has waxing and waning periods of weakness, and confusion, and son reports that is difficult to get him out of the house to go to medical providers.  Initial evaluation concerning for febrile illness, code sepsis initiated when back to return greater than 5.  Lactate decreased with IV fluids but patient symptoms persisted.  No clear evidence for source for sepsis, consideration given to CNS abnormality.  CT scan of the brain ordered.  Patient will require admission.  CRITICAL CARE-yes Performed by: Daleen Bo  Nursing Notes Reviewed/ Care Coordinated Applicable Imaging Reviewed Interpretation of Laboratory Data incorporated into ED treatment  Plan disposition following CT imaging, with appropriate treatment at the  time.  Anticipate hospitalization.    Final Clinical Impression(s) / ED Diagnoses Final diagnoses:  Sepsis with acute organ dysfunction without septic shock, due to unspecified organism, unspecified type ([place])  SOB (shortness of breath)    Rx / DC Orders ED Discharge Orders    None       Daleen Bo, MD 10/06/20 1951

## 2020-10-06 NOTE — Anesthesia Procedure Notes (Addendum)
Arterial Line Insertion Start/End11/10/2020 11:37 PM Performed by: Gaynelle Cage, CRNA, attending  Patient location: OOR procedure area. Preanesthetic checklist: patient identified, IV checked, site marked, risks and benefits discussed, surgical consent, monitors and equipment checked, pre-op evaluation, timeout performed and anesthesia consent Lidocaine 1% used for infiltration Left, radial was placed Catheter size: 20 Fr Hand hygiene performed  and maximum sterile barriers used   Attempts: 1 Procedure performed using ultrasound guided technique. Ultrasound Notes:anatomy identified, needle tip was noted to be adjacent to the nerve/plexus identified and no ultrasound evidence of intravascular and/or intraneural injection Following insertion, dressing applied and Biopatch. Post procedure assessment: normal and unchanged  Patient tolerated the procedure well with no immediate complications.

## 2020-10-06 NOTE — Procedures (Signed)
Neuro-Interventional Radiology  Post Cerebral Angiogram Procedure Note  Operator:    Dr. Earleen Newport Assistant:   None  History:   87 yo male with acute right MCA syndrome, presents for acute cerebral angiogram and treatment of right ICA stenosis  Baseline mRS:  3 NIHSS:    19  Procedure: US guided right CFA access US guided left CFA access for arterial pressure monitor US guided access right radial artery Right Cervical & Cerebral Angiogram Acute right ICA angioplasty with cerebral protection using a NAV-6 device 35mm x 66mm angioplasty Deployment of 76F Angioseal right CFA access Right radial TR band = 15cc air  Findings:    Pre: Critical stenosis of right ICA origin, NASCET = 86% Post: 41 % NASCET  Anesthesia:   GETA  EBL:    91TA     Complication:  None   Medication: IV tPA administered?: no IA Medication:  No 650mg  PO ASA via OG tube 180mg  PO brilinta via OG tube Cangrelor infusion 75mcg/kg bolus, with infusion of 35mcg/kg.  Recommendations: - right hip straight overnight - left hip straight overnight - Goal SBP 120-140.   - Right TR band at radial access site.  15cc air to start.  May start decreasing band 3minutes after the cangrelor infusion has stopped.  Scheduled to stop at 12:15am.  May start decreasing air at 1am - Frequent NV checks - Repeat CT or MRI imaging recommended within 36 hours, discretion of Neurology - NIR to follow - recommend dual anti-platelet with aspirin and brilinta.  brilinta for 30 days and then switch to plavix is reasonable  Signed,  Dulcy Fanny. Earleen Newport, DO

## 2020-10-06 NOTE — Anesthesia Procedure Notes (Signed)
Procedure Name: Intubation Date/Time: 10/06/2020 7:39 PM Performed by: Jearld Pies, CRNA Pre-anesthesia Checklist: Patient identified, Emergency Drugs available, Suction available and Patient being monitored Patient Re-evaluated:Patient Re-evaluated prior to induction Oxygen Delivery Method: Circle System Utilized Preoxygenation: Pre-oxygenation with 100% oxygen Induction Type: IV induction, Rapid sequence and Cricoid Pressure applied Laryngoscope Size: Mac and 3 Grade View: Grade I Tube type: Oral Tube size: 7.5 mm Number of attempts: 1 Airway Equipment and Method: Stylet Placement Confirmation: ETT inserted through vocal cords under direct vision,  positive ETCO2 and breath sounds checked- equal and bilateral Secured at: 22 cm Tube secured with: Tape Dental Injury: Teeth and Oropharynx as per pre-operative assessment

## 2020-10-06 NOTE — ED Notes (Signed)
CRITICAL VALUE ALERT  Critical Value:  Lactic Acid 5.0  Date & Time Notied:  10/06/20 1159  Provider Notified: Dr. Eulis Foster  Orders Received/Actions taken: EDP notified

## 2020-10-06 NOTE — Anesthesia Preprocedure Evaluation (Signed)
Anesthesia Evaluation  Preop documentation limited or incomplete due to emergent nature of procedure.  Airway        Dental   Pulmonary former smoker,           Cardiovascular hypertension, + CAD  + pacemaker      Neuro/Psych    GI/Hepatic   Endo/Other  Hypothyroidism   Renal/GU      Musculoskeletal   Abdominal   Peds  Hematology  (+) anemia ,   Anesthesia Other Findings   Reproductive/Obstetrics                            Anesthesia Physical Anesthesia Plan  ASA: III and emergent  Anesthesia Plan: General   Post-op Pain Management:    Induction: Intravenous  PONV Risk Score and Plan: Treatment may vary due to age or medical condition  Airway Management Planned: Oral ETT  Additional Equipment:   Intra-op Plan:   Post-operative Plan: Possible Post-op intubation/ventilation  Informed Consent:   Plan Discussed with: Anesthesiologist  Anesthesia Plan Comments:         Anesthesia Quick Evaluation

## 2020-10-06 NOTE — H&P (Addendum)
NEUROLOGY CONSULTATION NOTE   Date of service: October 06, 2020 Patient Name: Fernando [name] MRN:  [id number] DOB:  1927-07-29 Reason for consult: "Stroke code"  History of Present Illness  Fernando [name] is a 86 y.o. male with PMH significant for chronic Afibb on Xarelto, CAD, HTN, HLD, prior L CEA, SCC of the parotid gland, recent TIA in august 2021 with workup at that time revealing significant short R ICA stenosis with string sign on CT Angios who presents as a transfer from [facility] for stenting of the R ICA.  Patient was initially seen at [facility] for weakness and poor responsiveness. A stroke code was called and he was seen by teleneurology for progressively worsening weakness. He was noted to be able to raise both his arms up as recently as 1630 on 10/06/20.  He was not a candidate for tPA due to him being on Xarelto. He had a CT angio and CT perfusion which demonstrated persistent R ICA string sign with large mismatch of 3104ml and no core in the entire R cerebral hemisphere with delayed transit.  He was sent over for urgent stent placement in the R CCA.  NIHSS: 19 MRS: 3 TPA: No, on xarelto Thrombectomy: yes.   ROS   Unable to obtain due to the acuity of the situation and importance of getting him to IR in time.  Past History   Past Medical History:  Diagnosis Date  . Arthritis   . Atrial fibrillation ([place])   . Carotid artery disease ([place])    Dr. Kellie Simmering - s/p left CEA  . Coronary artery disease   . Essential hypertension, benign   . Hyperlipidemia   . 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 This mass was initially felt to be 1.7 cm at the time of presentation but had enlarged significantly to 4 cm clinically at the time of surgery. Pathologically it was 3.4 cm. A lymph node deep to the parotid was enlarged clinically at the  time of surgery but was negative   . Tachycardia-bradycardia syndrome [facility])    Past Surgical History:  Procedure Laterality Date  . CAROTID ENDARTERECTOMY Left Jan. 3, 2009   CE  . CATARACT EXTRACTION W/ INTRAOCULAR LENS IMPLANT     Bilateral  . ESOPHAGOGASTRODUODENOSCOPY N/A 03/29/2018   Procedure: ESOPHAGOGASTRODUODENOSCOPY (EGD);  Surgeon: Danie Binder, MD;  Location[facility];  Service: Endoscopy;  Laterality: N/A;  . EYE SURGERY    . INSERT / REPLACE / REMOVE PACEMAKER    . PACEMAKER INSERTION  10-01-2012  . PARATHYROIDECTOMY  10/28/2012  . PAROTIDECTOMY  10/28/2012   Procedure: PAROTIDECTOMY;  Surgeon: Ascencion Dike, MD;  Location: [place];  Service: ENT;  Laterality: Right;  Total Parotidectomy  . PERMANENT PACEMAKER INSERTION N/A 10/01/2012   Procedure: PERMANENT PACEMAKER INSERTION;  Surgeon: Evans Lance, MD;  Location: [facility] CATH LAB;  Service: Cardiovascular;  Laterality: N/A;   Family History  Problem Relation Age of Onset  . Heart disease Sister   . Heart attack Sister   . Cirrhosis Mother   . Cancer Brother        brain cancer  . Cancer Sister        stomach cancer   Social History   Socioeconomic History  . Marital status: Widowed    Spouse name: Not on file  . Number of children: Not on file  .  Years of education: Not on file  . Highest education level: Not on file  Occupational History  . Not on file  Tobacco Use  . Smoking status: Former Smoker    Types: Cigarettes    Quit date: 11/25/1972    Years since quitting: 47.8  . Smokeless tobacco: Never Used  Vaping Use  . Vaping Use: Never used  Substance and Sexual Activity  . Alcohol use: Yes    Alcohol/week: 1.0 standard drink    Types: 1 Cans of beer per week    Comment: daily  . Drug use: No  . Sexual activity: Never  Other Topics Concern  . Not on file  Social History Narrative  . Not on file   Social Determinants of Health   Financial Resource Strain:   . Difficulty of Paying Living  Expenses: Not on file  Food Insecurity:   . Worried About Charity fundraiser in the Last Year: Not on file  . Ran Out of Food in the Last Year: Not on file  Transportation Needs:   . Lack of Transportation (Medical): Not on file  . Lack of Transportation (Non-Medical): Not on file  Physical Activity:   . Days of Exercise per Week: Not on file  . Minutes of Exercise per Session: Not on file  Stress:   . Feeling of Stress : Not on file  Social Connections:   . Frequency of Communication with Friends and Family: Not on file  . Frequency of Social Gatherings with Friends and Family: Not on file  . Attends Religious Services: Not on file  . Active Member of Clubs or Organizations: Not on file  . Attends Archivist Meetings: Not on file  . Marital Status: Not on file   No Known Allergies  Medications   Medications Prior to Admission  Medication Sig Dispense Refill Last Dose  . atorvastatin (LIPITOR) 40 MG tablet Take 40 mg by mouth every morning.      Marland Kitchen levothyroxine (SYNTHROID) 50 MCG tablet Take 1 tablet (50 mcg total) by mouth daily before breakfast. 30 tablet 3   . metoprolol tartrate (LOPRESSOR) 25 MG tablet TAKE 1 TABLET TWICE A DAY (Patient taking differently: Take 25 mg by mouth 2 (two) times daily. ) 180 tablet 0   . ondansetron (ZOFRAN ODT) 4 MG disintegrating tablet 4mg  ODT q4 hours prn nausea/vomit 12 tablet 0   . pantoprazole (PROTONIX) 40 MG tablet Take 1 tablet (40 mg total) by mouth 2 (two) times daily before a meal. Twice a day for 3 months and then start taking daily. 60 tablet 3   . XARELTO 15 MG TABS tablet TAKE 1 TABLET BY MOUTH ONCE DAILY WITH SUPPER (Patient taking differently: Take 15 mg by mouth daily with supper. ) 90 tablet 3      Vitals   Vitals:   10/06/20 1805 10/06/20 1815 10/06/20 1830 10/06/20 1845  BP: (!) 138/108 (!) 149/103 (!) 141/98 (!) 159/75  Pulse: 81 81 92 84  Resp: (!) 29 (!) 23 (!) 34 (!) 28  Temp:      TempSrc:      SpO2:  (!) 89% 97% 94% 100%  Weight:         Body mass index is 24.55 kg/m.  Physical Exam   General: Laying comfortably in bed; in no acute distress. HENT: Normal oropharynx and mucosa. Normal external appearance of ears and nose. Neck: Supple, no pain or tenderness CV: No JVD. No peripheral edema. Pulmonary:  Symmetric Chest rise. Normal respiratory effort. Abdomen: Soft to touch, non-tender. Ext: No cyanosis, edema, or deformity Skin: No rash. Normal palpation of skin.  Musculoskeletal: Normal digits and nails by inspection. No clubbing.  Neurologic Examination  Mental status/Cognition: Alert, oriented to self only. Speech/language: significantly dysarthric speech, non fluent, comprehension intact to simple commands only. Cranial nerves:   CN II Pupils equal and reactive to light, L hemianopsia.   CN III,IV,VI Gaze deviation to the right, unable to cross midline.   CN V Does not regard touch in left face.   CN VII L facial droop   CN VIII Hard of hearing.   CN IX & X -   CN XI -   CN XII -   Motor:  Muscle bulk: poor, tone normal, pronator drift yes tremor none Unable to do detailed strength testing but does have LUE drift and no antigravity movement in LLE. Spontaneously moving his RUE and RLE atleast antigracity with no drift.  Reflexes: Deferred  Sensation:  Light touch Does not regad light touch or nailbed pressure in LUE, LLE and L face.   Pin prick    Temperature    Vibration   Proprioception     Labs   CBC:  Recent Labs  Lab 10/06/20 1042 10/06/20 1738  WBC 17.1*  --   NEUTROABS 15.0*  --   HGB 9.1* 9.5*  HCT 30.1* 28.0*  MCV 76.2*  --   PLT 242  --     Basic Metabolic Panel:  Lab Results  Component Value Date   NA 142 10/06/2020   K 3.7 10/06/2020   CO2 20 (L) 10/06/2020   GLUCOSE 145 (H) 10/06/2020   BUN 32 (H) 10/06/2020   CREATININE 1.30 (H) 10/06/2020   CALCIUM 9.1 10/06/2020   GFRNONAA 44 (L) 10/06/2020   GFRAA 44 (L) 07/02/2020    Lipid Panel:  Lab Results  Component Value Date   LDLCALC 61 07/03/2020   HgbA1c:  Lab Results  Component Value Date   HGBA1C 5.9 (H) 07/03/2020   Urine Drug Screen:     Component Value Date/Time   LABOPIA NONE DETECTED 07/02/2020 1830   COCAINSCRNUR NONE DETECTED 07/02/2020 1830   LABBENZ NONE DETECTED 07/02/2020 1830   AMPHETMU NONE DETECTED 07/02/2020 1830   THCU NONE DETECTED 07/02/2020 1830   LABBARB NONE DETECTED 07/02/2020 1830    Alcohol Level     Component Value Date/Time   ETH <10 10/06/2020 1727     Results for orders placed during the hospital encounter of 10/06/20  CT Code Stroke 1. Hyperdense Right MCA suspicious for Emergent Large Vessel Occlusion. 2. No acute cortically based infarct or acute intracranial hemorrhage identified. ASPECTS 10. 3. Stable CT appearance of brain parenchyma since August.  CT ANGIOGRAPHY HEAD AND NECK and CT PERFUSION BRAIN  IMPRESSION: 1. No emergent large vessel occlusion. 2. Persistent radiographic string sign of the proximal right ICA. 3. Delayed transit throughout the entire right cerebral hemisphere and left ACA territory related to the high-grade proximal right ICA stenosis and chronically diminutive/diseased left ACA. No core infarct identified by CTP. 4. Severe right P2 stenosis. 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 6. Bilateral ground-glass lung opacities which could reflect edema or infection. Moderate right pleural effusion. 7.  Aortic Atherosclerosis (ICD10-I70.0).    Impression   Antjuan Rothe is a 86 y.o. male with PMH significant for chronic Afibb on Xarelto, CAD, HTN, HLD, prior L CEA, SCC of the parotid gland, recent TIA in august  2021 with workup at that time revealing significant short R ICA stenosis with string sign on CT Angios who presents as a transfer from [facility] for stenting of the R ICA. His neurologic examination is notable for LUE and LLE weakness and numbness with left sided  neglect and slurred speech with facial droop. Symptoms are concerning for R hemispheric stroke in the setting of R Carotid string sign.  Given the severity of his symptoms and less reponsive to non invasive measures, he was sent for carotid intervention.  Recommendations   - Frequent Neuro checks per stroke unit protocol - MRI Brain without contrast - TTE pending - Lipid panel with LDL - Atorvastatin 80mg  daily - HbA1c - Antithrombotic - Asprin 81mg  daily and Brilinta 90 BID - DVT prophylaxis ordered - SBP goal per Neuro IR between 357-897 systolic. - Telemetry monitoring for arrythmia - Bedside swallow screen prior to PO intake. - Stroke education booklet - PT/OT/SLP consult   Hypoxic respiratory failure and intubated: - Will have PCCM team assist with management.  Sepsis with suspected Respiratory source and Lactic acidosis: - Fluids. - Ceftriaxone and Azithromycin per PCCM team. - F/up Blood cultures.  Hypothyroidism: - cont home Levothyroxine  GERD: - cont Protonix. ______________________________________________________________________   Thank you for the opportunity to take part in the care of this patient. If you have any further questions, please contact the neurology consultation attending.  Signed,  [place] Pager Number [phone]

## 2020-10-06 NOTE — ED Provider Notes (Signed)
Blood pressure 134/73, pulse 92, temperature (!) 101.1 F (38.4 C), temperature source Rectal, resp. rate (!) 26, weight 64.9 kg, SpO2 95 %.  Assuming care from Dr. Eulis Foster.  In short, Fernando [name] is a 86 y.o. male with a chief complaint of Shortness of Breath and Back Pain .  Refer to the original H&P for additional details.  The current plan of care is to f/u after CT head. Considered LP with no clear source as of yet but patient on Xarelto. Broad spectrum abx given already. Clinically, patient with primarily respiratory symptoms. No shock. Will need admit after CT head.   05:05 PM  The Hospitalist evaluated the patient and now apparently with fairly dense weakness on the left. No noted deficit on Dr. Beatriz Chancellor exam. Will activate Code Stroke here and take for CT head urgently.   05:55 PM  Spoke with Dr. Leonel Ramsay with Neurology after evaluation. Sent patient for CTA head/neck and perfusion. No tPA but will follow vascular imaging.   06:23 PM  Spoke again with Dr. Leonel Ramsay after review of the CTA and perfusion.  He would like the patient sent emergently to the [facility] emergency department for IR intervention.  Patient is maintaining his airway for transport.  Spoke with Dr. Darl Householder in the [facility] ED who accepts the patient in transfer. Calling [facility] for emergent transfer.   CRITICAL CARE Performed by: Margette Fast Total critical care time: 45 minutes Critical care time was exclusive of separately billable procedures and treating other patients. Critical care was necessary to treat or prevent imminent or life-threatening deterioration. Critical care was time spent personally by me on the following activities: development of treatment plan with patient and/or surrogate as well as nursing, discussions with consultants, evaluation of patient's response to treatment, examination of patient, obtaining history from patient or surrogate, ordering and performing treatments and interventions, ordering  and review of laboratory studies, ordering and review of radiographic studies, pulse oximetry and re-evaluation of patient's condition.  Nanda Quinton, MD Emergency Medicine   I reviewed all nursing notes, vitals, pertinent old records, EKGs, labs, imaging (as available).    Margette Fast, MD 10/06/20 [phone]

## 2020-10-06 NOTE — Consult Note (Signed)
[facility] Provider: Coralyn Helling Consult Participants: Jerene Pitch, RN, Patient and patient's Son, Myself Location of the provider: [place], [place] Location of the patient: [facility]  This consult was provided via telemedicine with 2-way video and audio communication. The patient/family was informed that care would be provided in this way and agreed to receive care in this manner.    Chief Complaint: Left Fernando [name]  HPI: 86 yo M with a history of atrial fibrillation on xarelto who was in his normal state of health yesterday, but progressively has become weaker and having more difficulty getting around. He was still able to raise both his arms, however, as recently as 4:30 pm, however. Given the relatively abrupt worsening, he was activated as a code stroke.   At baseline, he is able to attend to his own ADLs, but does not drive anymore(was up until earlier this year) and does not cook for himself.    LKW: 7pm  tpa given?: No, on Xarelto IR Thrombectomy? yes Modified Rankin Scale: 3-Moderate disability-requires help but walks WITHOUT assistance Time of teleneurologist evaluation: 17:30  Exam: Vitals:   10/06/20 1530 10/06/20 1600  BP: 122/78 134/73  Pulse: 89 92  Resp: (!) 34 (!) 26  Temp:    SpO2: 100% 95%    General: In bed NAD  1A: Level of Consciousness - 0 1B: Ask Month and Age - 2 1C: 'Blink Eyes' & 'Squeeze Hands' - 0 2: Test Horizontal Extraocular Movements - 2 3: Test Visual Fields - 2 4: Test Facial Palsy - 2 5A: Test Left Arm Motor Drift - 3 5B: Test Right Arm Motor Drift - 0 6A: Test Left Leg Motor Drift - 3 6B: Test Right Leg Motor Drift - 0 7: Test Limb Ataxia - 0 8: Test Sensation - 0 9: Test Language/Aphasia- 1 10: Test Dysarthria - 2 11: Test Extinction/Inattention - 2 NIHSS score: 19   Imaging Reviewed: CTA/CTP - he has a relatively large area of penumbra   Labs reviewed in epic and pertinent  values follow: Cr 1.5   Assessment: 86 yo M with severe R hemispheric syndrome in the setting of R carotid string sign. He was already receiving fluids, and was not hypotensive, and despite this remains symptomatic. Given the severity of these symptoms and fact they are unresponsive ot less invasive means, I discussed potentially doing an emergent carotid intervention to improve perfusion to the injured hemisphere. I suspect that his neurological event is actually much more recent, but since he was having more difficulty walking(possibly due to fever, etc) I am using his beditime yesterday as LKW.   The son states that if there is any chance he could return to the level of independence that he had before this, he would want to pursue this and therefore we are pursuing potential intervention.   Recommendations:  1) Transfer to [facility] for potential mechanical intervention 2)  Maintain adequate perfusion with BP support 3) antithrombotics per IR team. 4) Neurology and IR have been notifed at [facility]    This patient is receiving care for possible acute neurological changes. There was 65 minutes of care by this provider at the time of service, including time for direct evaluation via telemedicine, review of medical records, imaging studies and discussion of findings with providers, the patient and/or family.  Roland Rack, MD [facility] [phone]  If 7pm- 7am, please page neurology on call as listed in [place].

## 2020-10-06 NOTE — Progress Notes (Signed)
Pharmacy Antibiotic Note  Fernando [name] is a 86 y.o. male admitted on 10/06/2020 with unknown source of infection.  Pharmacy has been consulted for Vancomycin and Cefepime dosing.  Plan: Vancomycin 1500mg  IV loading dose in ED, then 750mg  IV every 24 hours.  Goal trough 15-20 mcg/mL.  Cefepime 2gm IV q24h F/U cxs and clinical progress Monitor V/S, labs, and levels as indicated  Weight: 64.9 kg (143 lb)  Temp (24hrs), Avg:99.5 F (37.5 C), Min:97.9 F (36.6 C), Max:101.1 F (38.4 C)  Recent Labs  Lab 10/06/20 1042 10/06/20 1351  WBC 17.1*  --   CREATININE 1.49*  --   LATICACIDVEN 5.0* 4.8*    Estimated Creatinine Clearance: 27 mL/min (A) (by C-G formula based on SCr of 1.49 mg/dL (H)).    No Known Allergies  Antimicrobials this admission: Vancomycin 11/12 >>  Cefepime 11/12 >>   Dose adjustments this admission: prn  Microbiology results: 11/12 BCx: pending  MRSA PCR:   Thank you for allowing pharmacy to be a part of this patient's care.  Isac Sarna, BS [place], [place] Clinical Pharmacist Pager [phone] 10/06/2020 3:51 PM

## 2020-10-06 NOTE — ED Triage Notes (Signed)
Pt from home. Family stated that pt was "not right" on Wednesday evening.   Pt c/o of sob and back pain since this morning.

## 2020-10-06 NOTE — Progress Notes (Signed)
Pt is a 86 yr old who has had a decrease in his function today, with a sharp decline noted at 1630 by his family. His reported symptoms were left hemiparesis. Pt worked up at [facility] and CT showed no hemorrhage, CTA/P showed no core but large penumbra (312 ml) of Rt hemisphere. Transferred to [facility] for [place]. Per chart review, pt is elderly but independent. He has a pacemaker, has A Fib and takes Xarelto, therefore not a candidate for TPA. Pt [facility] at [facility] at 1925. He was weak on left with a rightward gaze and left neglect. Speech was unintelligible. See flowsheet for full NIH and times. Pt taken directly to IR suite. Neurologist confirms that verbal consent had been obtained by Dr Leonel Ramsay. Blood work, Covid swab and CBG without contraindication to proceed. Report given to [facility].

## 2020-10-06 NOTE — Sepsis Progress Note (Signed)
Sepsis protocol being followed by [facility] 

## 2020-10-07 ENCOUNTER — Encounter (HOSPITAL_COMMUNITY): Payer: Self-pay | Admitting: Registered Nurse

## 2020-10-07 ENCOUNTER — Inpatient Hospital Stay (HOSPITAL_COMMUNITY): Payer: PPO

## 2020-10-07 ENCOUNTER — Encounter (HOSPITAL_COMMUNITY)
Admission: EM | Disposition: A | Discharge status: Rehabilitation Facility | Payer: Self-pay | Source: Home / Self Care | Attending: Neurology

## 2020-10-07 DIAGNOSIS — A419 Sepsis, unspecified organism: Secondary | ICD-10-CM | POA: Diagnosis not present

## 2020-10-07 DIAGNOSIS — Z978 Presence of other specified devices: Secondary | ICD-10-CM

## 2020-10-07 DIAGNOSIS — I6521 Occlusion and stenosis of right carotid artery: Secondary | ICD-10-CM

## 2020-10-07 DIAGNOSIS — I4891 Unspecified atrial fibrillation: Secondary | ICD-10-CM

## 2020-10-07 DIAGNOSIS — I351 Nonrheumatic aortic (valve) insufficiency: Secondary | ICD-10-CM | POA: Diagnosis not present

## 2020-10-07 DIAGNOSIS — E872 Acidosis: Secondary | ICD-10-CM

## 2020-10-07 DIAGNOSIS — R1312 Dysphagia, oropharyngeal phase: Secondary | ICD-10-CM

## 2020-10-07 DIAGNOSIS — I340 Nonrheumatic mitral (valve) insufficiency: Secondary | ICD-10-CM

## 2020-10-07 DIAGNOSIS — I495 Sick sinus syndrome: Secondary | ICD-10-CM

## 2020-10-07 DIAGNOSIS — I6389 Other cerebral infarction: Secondary | ICD-10-CM | POA: Diagnosis not present

## 2020-10-07 DIAGNOSIS — I4811 Longstanding persistent atrial fibrillation: Secondary | ICD-10-CM

## 2020-10-07 DIAGNOSIS — I350 Nonrheumatic aortic (valve) stenosis: Secondary | ICD-10-CM

## 2020-10-07 DIAGNOSIS — J9601 Acute respiratory failure with hypoxia: Secondary | ICD-10-CM

## 2020-10-07 DIAGNOSIS — R6520 Severe sepsis without septic shock: Secondary | ICD-10-CM

## 2020-10-07 HISTORY — PX: RADIOLOGY WITH ANESTHESIA: SHX6223

## 2020-10-07 LAB — BLOOD CULTURE ID PANEL (REFLEXED) - BCID2

## 2020-10-07 LAB — POCT I-STAT 7, (LYTES, BLD GAS, ICA,H+H)
Acid-base deficit: 7 mmol/L — ABNORMAL HIGH (ref 0.0–2.0)
Bicarbonate: 18.3 mmol/L — ABNORMAL LOW (ref 20.0–28.0)
Calcium, Ion: 1.1 mmol/L — ABNORMAL LOW (ref 1.15–1.40)
HCT: 26 % — ABNORMAL LOW (ref 39.0–52.0)
Hemoglobin: 8.8 g/dL — ABNORMAL LOW (ref 13.0–17.0)
O2 Saturation: 100 %
Patient temperature: 95.7
Potassium: 3.7 mmol/L (ref 3.5–5.1)
Sodium: 145 mmol/L (ref 135–145)
TCO2: 19 mmol/L — ABNORMAL LOW (ref 22–32)
pCO2 arterial: 33.4 mmHg (ref 32.0–48.0)
pH, Arterial: 7.339 — ABNORMAL LOW (ref 7.350–7.450)
pO2, Arterial: 280 mmHg — ABNORMAL HIGH (ref 83.0–108.0)

## 2020-10-07 LAB — BASIC METABOLIC PANEL
Anion gap: 10 (ref 5–15)
BUN: 32 mg/dL — ABNORMAL HIGH (ref 8–23)
CO2: 16 mmol/L — ABNORMAL LOW (ref 22–32)
Calcium: 7.4 mg/dL — ABNORMAL LOW (ref 8.9–10.3)
Chloride: 114 mmol/L — ABNORMAL HIGH (ref 98–111)
Creatinine, Ser: 1.23 mg/dL (ref 0.61–1.24)
GFR, Estimated: 55 mL/min — ABNORMAL LOW (ref >60)
Glucose, Bld: 110 mg/dL — ABNORMAL HIGH (ref 70–99)
Potassium: 3.2 mmol/L — ABNORMAL LOW (ref 3.5–5.1)
Sodium: 140 mmol/L (ref 135–145)

## 2020-10-07 LAB — CBC
HCT: 25.6 % — ABNORMAL LOW (ref 39.0–52.0)
Hemoglobin: 7.9 g/dL — ABNORMAL LOW (ref 13.0–17.0)
MCH: 23.1 pg — ABNORMAL LOW (ref 26.0–34.0)
MCHC: 30.9 g/dL (ref 30.0–36.0)
MCV: 74.9 fL — ABNORMAL LOW (ref 80.0–100.0)
Platelets: 196 10*3/uL (ref 150–400)
RBC: 3.42 MIL/uL — ABNORMAL LOW (ref 4.22–5.81)
RDW: 17.9 % — ABNORMAL HIGH (ref 11.5–15.5)
WBC: 17.1 10*3/uL — ABNORMAL HIGH (ref 4.0–10.5)
nRBC: 0.1 % (ref 0.0–0.2)

## 2020-10-07 LAB — ECHOCARDIOGRAM COMPLETE
AR max vel: 1.42 cm2
AV Area VTI: 1.46 cm2
AV Area mean vel: 1.28 cm2
AV Mean grad: 10.5 mmHg
AV Peak grad: 21.6 mmHg
Ao pk vel: 2.33 m/s
Height: 65 in
P 1/2 time: 292 msec
Weight: 2288 oz

## 2020-10-07 LAB — GLUCOSE, CAPILLARY
Glucose-Capillary: 143 mg/dL — ABNORMAL HIGH (ref 70–99)
Glucose-Capillary: 132 mg/dL — ABNORMAL HIGH (ref 70–99)
Glucose-Capillary: 98 mg/dL (ref 70–99)
Glucose-Capillary: 83 mg/dL (ref 70–99)
Glucose-Capillary: 76 mg/dL (ref 70–99)
Glucose-Capillary: 82 mg/dL (ref 70–99)
Glucose-Capillary: 94 mg/dL (ref 70–99)

## 2020-10-07 LAB — TRIGLYCERIDES: Triglycerides: 39 mg/dL (ref <150)

## 2020-10-07 LAB — LACTIC ACID, PLASMA: Lactic Acid, Venous: 1.8 mmol/L (ref 0.5–1.9)

## 2020-10-07 LAB — LIPID PANEL
Cholesterol: 87 mg/dL (ref 0–200)
HDL: 53 mg/dL (ref >40)
LDL Cholesterol: 26 mg/dL (ref 0–99)
Total CHOL/HDL Ratio: 1.6 RATIO
Triglycerides: 42 mg/dL (ref <150)
VLDL: 8 mg/dL (ref 0–40)

## 2020-10-07 LAB — MRSA PCR SCREENING: MRSA by PCR: NEGATIVE

## 2020-10-07 LAB — HEMOGLOBIN A1C
Hgb A1c MFr Bld: 5.7 % — ABNORMAL HIGH (ref 4.8–5.6)
Mean Plasma Glucose: 116.89 mg/dL

## 2020-10-07 SURGERY — MRI WITH ANESTHESIA
Anesthesia: Choice

## 2020-10-07 MED ORDER — ATORVASTATIN CALCIUM 80 MG PO TABS
80.0000 mg | ORAL_TABLET | Freq: Every day | ORAL | Status: DC
  Administered 2020-10-09: 80 mg via ORAL
  Filled 2020-10-07: qty 1

## 2020-10-07 MED ORDER — PROPOFOL 1000 MG/100ML IV EMUL: 0.0000 ug/kg/min | INTRAVENOUS | Status: DC

## 2020-10-07 MED ORDER — CHLORHEXIDINE GLUCONATE CLOTH 2 % EX PADS
6.0000 | MEDICATED_PAD | Freq: Every day | CUTANEOUS | Status: DC
  Administered 2020-10-07 – 2020-10-11 (×4): 6 via TOPICAL

## 2020-10-07 MED ORDER — TICAGRELOR 90 MG PO TABS
90.0000 mg | ORAL_TABLET | Freq: Two times a day (BID) | ORAL | Status: DC
  Administered 2020-10-07: 90 mg
  Filled 2020-10-07: qty 1

## 2020-10-07 MED ORDER — ORAL CARE MOUTH RINSE
15.0000 mL | OROMUCOSAL | Status: DC
  Administered 2020-10-07 (×5): 15 mL via OROMUCOSAL

## 2020-10-07 MED ORDER — STROKE: EARLY STAGES OF RECOVERY BOOK: Freq: Once | Status: DC

## 2020-10-07 MED ORDER — ACETAMINOPHEN 650 MG RE SUPP: 650.0000 mg | Freq: Four times a day (QID) | RECTAL | Status: DC | PRN

## 2020-10-07 MED ORDER — CLEVIDIPINE BUTYRATE 0.5 MG/ML IV EMUL: 0.0000 mg/h | INTRAVENOUS | Status: DC

## 2020-10-07 MED ORDER — POLYETHYLENE GLYCOL 3350 17 G PO PACK
17.0000 g | PACK | Freq: Every day | ORAL | Status: DC
  Administered 2020-10-09: 17 g via ORAL
  Filled 2020-10-07: qty 1

## 2020-10-07 MED ORDER — LEVOTHYROXINE SODIUM 50 MCG PO TABS: 50.0000 ug | ORAL_TABLET | Freq: Every day | ORAL | Status: DC

## 2020-10-07 MED ORDER — DOCUSATE SODIUM 50 MG/5ML PO LIQD: 100.0000 mg | Freq: Two times a day (BID) | ORAL | Status: DC

## 2020-10-07 MED ORDER — PANTOPRAZOLE SODIUM 40 MG PO TBEC: 40.0000 mg | DELAYED_RELEASE_TABLET | Freq: Every day | ORAL | Status: DC

## 2020-10-07 MED ORDER — PANTOPRAZOLE SODIUM 40 MG PO PACK: 40.0000 mg | PACK | Freq: Every day | ORAL | Status: DC

## 2020-10-07 MED ORDER — PERFLUTREN LIPID MICROSPHERE
1.0000 mL | INTRAVENOUS | Status: AC | PRN
  Administered 2020-10-07: 3 mL via INTRAVENOUS
  Filled 2020-10-07: qty 10

## 2020-10-07 MED ORDER — CHLORHEXIDINE GLUCONATE 0.12% ORAL RINSE (MEDLINE KIT)
15.0000 mL | Freq: Two times a day (BID) | OROMUCOSAL | Status: DC
  Administered 2020-10-07 – 2020-10-08 (×3): 15 mL via OROMUCOSAL

## 2020-10-07 MED ORDER — DOCUSATE SODIUM 100 MG PO CAPS
100.0000 mg | ORAL_CAPSULE | Freq: Every day | ORAL | Status: DC
  Administered 2020-10-09: 100 mg via ORAL
  Filled 2020-10-07: qty 1

## 2020-10-07 MED ORDER — VITAL HIGH PROTEIN PO LIQD: 1000.0000 mL | ORAL | Status: DC

## 2020-10-07 MED ORDER — SODIUM CHLORIDE 0.9 % IV SOLN: INTRAVENOUS | Status: DC

## 2020-10-07 MED ORDER — SODIUM CHLORIDE 0.9 % IV SOLN
2.0000 g | Freq: Every day | INTRAVENOUS | Status: DC
  Administered 2020-10-07 – 2020-10-10 (×4): 2 g via INTRAVENOUS
  Filled 2020-10-07 (×4): qty 2

## 2020-10-07 MED ORDER — NOREPINEPHRINE 4 MG/250ML-% IV SOLN
0.0000 ug/min | INTRAVENOUS | Status: DC
  Administered 2020-10-07: 10 ug/min via INTRAVENOUS
  Administered 2020-10-07: 2 ug/min via INTRAVENOUS
  Administered 2020-10-07: 15 ug/min via INTRAVENOUS
  Administered 2020-10-08: 11 ug/min via INTRAVENOUS
  Administered 2020-10-09: 1 ug/min via INTRAVENOUS
  Filled 2020-10-07 (×5): qty 250

## 2020-10-07 MED ORDER — POTASSIUM CHLORIDE 10 MEQ/100ML IV SOLN
10.0000 meq | INTRAVENOUS | Status: AC
  Administered 2020-10-07 (×6): 10 meq via INTRAVENOUS
  Filled 2020-10-07 (×6): qty 100

## 2020-10-07 MED ORDER — FUROSEMIDE 10 MG/ML IJ SOLN
40.0000 mg | Freq: Once | INTRAMUSCULAR | Status: AC
  Administered 2020-10-07: 40 mg via INTRAVENOUS
  Filled 2020-10-07: qty 4

## 2020-10-07 MED ORDER — ASPIRIN 325 MG PO TABS
325.0000 mg | ORAL_TABLET | Freq: Every day | ORAL | Status: DC
  Administered 2020-10-09: 325 mg via ORAL
  Filled 2020-10-07: qty 1

## 2020-10-07 MED ORDER — ASPIRIN 300 MG RE SUPP
300.0000 mg | Freq: Every day | RECTAL | Status: DC
  Administered 2020-10-07 – 2020-10-08 (×2): 300 mg via RECTAL
  Filled 2020-10-07 (×2): qty 1

## 2020-10-07 MED ORDER — SODIUM CHLORIDE 0.9 % IV SOLN
2.0000 g | Freq: Every day | INTRAVENOUS | Status: DC
  Administered 2020-10-07: 2 g via INTRAVENOUS
  Filled 2020-10-07: qty 2

## 2020-10-07 MED ORDER — LEVOTHYROXINE SODIUM 50 MCG PO TABS
50.0000 ug | ORAL_TABLET | Freq: Every day | ORAL | Status: DC
  Administered 2020-10-11 – 2020-10-14 (×4): 50 ug via ORAL
  Filled 2020-10-07 (×5): qty 1

## 2020-10-07 MED ORDER — ORAL CARE MOUTH RINSE
15.0000 mL | Freq: Two times a day (BID) | OROMUCOSAL | Status: DC
  Administered 2020-10-07: 15 mL via OROMUCOSAL

## 2020-10-07 MED ORDER — FENTANYL CITRATE (PF) 100 MCG/2ML IJ SOLN
25.0000 ug | INTRAMUSCULAR | Status: DC | PRN
  Filled 2020-10-07: qty 2

## 2020-10-07 MED ORDER — PHENYLEPHRINE HCL-NACL 10-0.9 MG/250ML-% IV SOLN
0.0000 ug/min | INTRAVENOUS | Status: DC
  Administered 2020-10-07: 110 ug/min via INTRAVENOUS
  Administered 2020-10-07: 35 ug/min via INTRAVENOUS
  Filled 2020-10-07 (×2): qty 250

## 2020-10-07 MED ORDER — INSULIN ASPART 100 UNIT/ML ~~LOC~~ SOLN
0.0000 [IU] | SUBCUTANEOUS | Status: DC
  Administered 2020-10-07 – 2020-10-12 (×5): 1 [IU] via SUBCUTANEOUS

## 2020-10-07 MED ORDER — ACETAMINOPHEN 325 MG PO TABS: 650.0000 mg | ORAL_TABLET | Freq: Four times a day (QID) | ORAL | Status: DC | PRN

## 2020-10-07 MED ORDER — ENOXAPARIN SODIUM 40 MG/0.4ML ~~LOC~~ SOLN
40.0000 mg | SUBCUTANEOUS | Status: DC
  Administered 2020-10-07: 40 mg via SUBCUTANEOUS
  Filled 2020-10-07: qty 0.4

## 2020-10-07 MED ORDER — SODIUM CHLORIDE 0.9 % IV SOLN: 250.0000 mL | INTRAVENOUS | Status: DC

## 2020-10-07 MED ORDER — TICAGRELOR 90 MG PO TABS: 90.0000 mg | ORAL_TABLET | Freq: Two times a day (BID) | ORAL | Status: DC

## 2020-10-07 MED ORDER — FENTANYL CITRATE (PF) 100 MCG/2ML IJ SOLN: 25.0000 ug | INTRAMUSCULAR | Status: DC | PRN

## 2020-10-07 MED ORDER — SODIUM CHLORIDE 0.9 % IV SOLN
500.0000 mg | Freq: Every day | INTRAVENOUS | Status: DC
  Administered 2020-10-07: 500 mg via INTRAVENOUS
  Filled 2020-10-07: qty 500

## 2020-10-07 MED ORDER — LEVOTHYROXINE SODIUM 50 MCG PO TABS
50.0000 ug | ORAL_TABLET | Freq: Every day | ORAL | Status: DC
  Administered 2020-10-07: 50 ug
  Filled 2020-10-07: qty 1

## 2020-10-07 NOTE — Progress Notes (Signed)
1710 call time 1717 exam started 1720 exam finished 1721 images sent to [place] exam completed in epic 1724 [facility] radiology called

## 2020-10-07 NOTE — Progress Notes (Addendum)
[facility] Physician-Brief Progress Note Patient Name: Fernando [name] DOB: July 01, 1927 MRN: [id number]   Date of Service  10/07/2020  HPI/Events of Note  Hypotension and hypoxia - Sat decreased into high 80's and BP also dropped. BP now = 150/80 after bedside nurse titrated Phenylephrine IV infusion up to 110 mcg/min. CXR reveals enlarged heart and pulmonary vascular congestion. Creatinine = 1.30.   eICU Interventions  Plan: 1. Increase PEEP to 10. 2. Lasix 40 mg IV X 1 now.      Intervention Category Major Interventions: Hypotension - evaluation and management;Hypoxemia - evaluation and management  Jolanta Cabeza Cornelia Copa 10/07/2020, 3:10 AM

## 2020-10-07 NOTE — Progress Notes (Signed)
STROKE TEAM PROGRESS NOTE   INTERVAL HISTORY His RNs are at the bedside.  Pt was extubated this am, currently awake alert, tracking bilaterally. However, severe dysarthria with intangible words. Not following simple commands. Still has left hemiparesis. He had fever overnight and blood culture showed 2/2 G+ cocci. On Abx. CT repeat this am showed no acute changes.   OBJECTIVE Vitals:   10/07/20 0419 10/07/20 0430 10/07/20 0445 10/07/20 0500  BP: (!) 142/67 129/70 139/69 125/67  Pulse: 64 60 61 62  Resp: 17 (!) 23 (!) 25 (!) 24  Temp:      TempSrc:      SpO2: 100% 100% 100% 100%  Weight:      Height:        CBC:  Recent Labs  Lab 10/06/20 1042 10/06/20 1042 10/06/20 1738 10/07/20 0242  WBC 17.1*  --   --   --   NEUTROABS 15.0*  --   --   --   HGB 9.1*   < > 9.5* 8.8*  HCT 30.1*   < > 28.0* 26.0*  MCV 76.2*  --   --   --   PLT 242  --   --   --    < > = values in this interval not displayed.    Basic Metabolic Panel:  Recent Labs  Lab 10/06/20 1042 10/06/20 1042 10/06/20 1738 10/07/20 0242  NA 138   < > 142 145  K 3.8   < > 3.7 3.7  CL 102  --  109  --   CO2 20*  --   --   --   GLUCOSE 172*  --  145*  --   BUN 37*  --  32*  --   CREATININE 1.49*  --  1.30*  --   CALCIUM 9.1  --   --   --    < > = values in this interval not displayed.    Lipid Panel:     Component Value Date/Time   CHOL 136 07/03/2020 0420   TRIG 40 07/03/2020 0420   HDL 67 07/03/2020 0420   CHOLHDL 2.0 07/03/2020 0420   VLDL 8 07/03/2020 0420   LDLCALC 61 07/03/2020 0420   HgbA1c:  Lab Results  Component Value Date   HGBA1C 5.9 (H) 07/03/2020   Urine Drug Screen:     Component Value Date/Time   LABOPIA NONE DETECTED 07/02/2020 1830   COCAINSCRNUR NONE DETECTED 07/02/2020 1830   LABBENZ NONE DETECTED 07/02/2020 1830   AMPHETMU NONE DETECTED 07/02/2020 1830   THCU NONE DETECTED 07/02/2020 1830   LABBARB NONE DETECTED 07/02/2020 1830    Alcohol Level     Component Value  Date/Time   ETH <10 10/06/2020 1727    IMAGING  MRI BRAIN WO CONTRAST - pending Pt has PPM?  CT HEAD WO CONTRAST No acute finding by CT. Chronic small-vessel ischemic changes throughout the brain as outlined above.  CT Code Stroke CTA Head W/WO contrast CT Code Stroke CTA Neck W/WO contrast CT PERFUSION BRAIN 10/06/2020 IMPRESSION:  1. No emergent large vessel occlusion.  2. Persistent radiographic string sign of the proximal right ICA.  3. Delayed transit throughout the entire right cerebral hemisphere and left ACA territory related to the high-grade proximal right ICA stenosis and chronically diminutive/diseased left ACA. No core infarct identified by CTP.  4. Severe right P2 stenosis.  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  6. Bilateral ground-glass lung opacities which could reflect edema or infection. Moderate  right pleural effusion.  7.  Aortic Atherosclerosis (ICD10-I70.0).   CT HEAD WO CONTRAST 10/07/2020 IMPRESSION:  No acute finding by CT. Chronic small-vessel ischemic changes throughout the brain as outlined above.   DG Chest Port 1 View 10/06/2020 IMPRESSION:  Enlargement of cardiac silhouette with pulmonary vascular congestion. Increased interstitial markings in the mid to lower lungs question minimal edema or developing chronic interstitial lung disease.   CT HEAD CODE STROKE WO CONTRAST 10/06/2020 IMPRESSION:  1. Hyperdense Right MCA suspicious for Emergent Large Vessel Occlusion.  2. No acute cortically based infarct or acute intracranial hemorrhage identified. ASPECTS 10.  3. Stable CT appearance of brain parenchyma since August.   Transthoracic Echocardiogram  1. Akinesis of the distal septum and apex; overall mildly reduced LV  function; no thrombus noted using definity.  2. Left ventricular ejection fraction, by estimation, is 40 to 45%. The  left ventricle has mildly decreased function. The left ventricle  demonstrates regional wall motion  abnormalities (see scoring  diagram/findings for description). Left ventricular  diastolic function could not be evaluated.  3. Right ventricular systolic function is normal. The right ventricular  size is normal.  4. Left atrial size was severely dilated.  5. Right atrial size was severely dilated.  6. The mitral valve is normal in structure. Mild mitral valve  regurgitation. No evidence of mitral stenosis.  7. Tricuspid valve regurgitation is moderate.  8. The aortic valve is tricuspid. Aortic valve regurgitation is mild.  Mild aortic valve stenosis.  9. The inferior vena cava is normal in size with greater than 50%  respiratory variability, suggesting right atrial pressure of 3 mmHg.    ECG - atrial fibrillation - ventricular response 78 BPM (See cardiology reading for complete details)   PHYSICAL EXAM  Temp:  [94.5 F (34.7 C)-101.1 F (38.4 C)] 97.3 F (36.3 C) (11/13 0800) Pulse Rate:  [32-124] 74 (11/13 0900) Resp:  [12-43] 33 (11/13 1000) BP: (93-164)/(57-139) 147/102 (11/13 1000) SpO2:  [78 %-100 %] 98 % (11/13 0900) Arterial Line BP: (79-150)/(48-88) 115/58 (11/13 1000) FiO2 (%):  [100 %] 100 % (11/13 0419) Weight:  [64.9 kg] 64.9 kg (11/12 1300)  General - Well nourished, well developed, mild SOB  Ophthalmologic - fundi not visualized due to noncooperation.  Cardiovascular - irregularly irregular heart rate and rhythm  Neuro - awake alert with eyes open, tracking bilaterally, however, not following simple commands. Severe dysarthria with intangible words, not able to cooperate with naming or repeating. With eye opening, eyes able to gaze bilaterally, not consistently blinking to visual threat bilaterally but seems no visual field deficit on gross confrontation. PERRL. Left facial droop. Tongue protrusion not cooperative. RUE 4/5 and no drift, LUE 3-/5 with drift to bed before 10 sec. RLE 3-/5 but able to hold knee flexion and foot on bed position but LLE drift  with knee flexion and foot on bed position. No babinski bilaterally. Sensation, coordination not cooperative and gait not tested.   ASSESSMENT/PLAN [name] is a 86 y.o. male with history of atrial fibrillation on Xarelto, PPM, L CEA, CAD, Htn, HLD, dementia and squamous cell cancer R parotid resected 2014, presenting with left sided weakness. He did not receive IV t-PA due to anticoagulation with Xarelto. Acute right ICA angioplasty with cerebral protection 10/06/20 at 11:28 PM Dr Earleen Newport.  Stroke: R MCA infarct due to right ICA high grade stenosis s/p angioplasty, etiology likely due to large vessel disease. However, given fever and bacteremia, needs to rule out endocarditis  CT Head - Hyperdense Right MCA suspicious for Emergent Large Vessel Occlusion. No acute cortically based infarct or acute intracranial hemorrhage identified. ASPECTS 10.   CTA H&N - Persistent radiographic string sign of the proximal right ICA. Severe right P2 stenosis.   CTP - Delayed transit throughout the entire right cerebral hemisphere and left ACA territory related to the high-grade proximal right ICA stenosis and chronically diminutive/diseased left ACA. No core infarct identified by CTP.   IR - right ICA angioplasty, 86%->41% stenosis post angioplasty  CT head - No acute finding by CT. Chronic small-vessel ischemic changes throughout the brain as outlined above.   MRI head - pending (PPM)  2D Echo - EF 40 to 45%, akinesis of the distal septum and apex  [employer] Virus 2 - negative  LDL - 26  HgbA1c - 5.7  UDS - not ordered  VTE prophylaxis - Lovenox  Xarelto (rivaroxaban) daily prior to admission, was on aspirin 81 mg daily and Brilinta. Now given no po access, on ASA 325. May consider heparin IV once repeat CT or MRI showing no large infarct.  Patient counseled to be compliant with his antithrombotic medications  Ongoing aggressive stroke risk factor management  Therapy  recommendations:  pending  Disposition:  Pending  Carotid stenosis  Hx of left ICA stenosis s/p left CEA  06/2020 CT head and neck showed right ICA bulb string sign  This admission CT head and neck again showed right ICA bulb string sign, status post angioplasty.  Right ICA 86% -> 41% stenosis post-procedure  Carotid Doppler pending  Chronic afib Tachy-brady syndrome s/p pacer  On Xarelto PTA  Currently on aspirin  Consider resume anticoagulation once no large infarct and without endocarditis  Bacteremia  Fever Leukocytosis  On Abx   Blood culture 2/2 G+ cocci  WBC 17.1-> 17.1  Consider TEE to rule out endocarditis  CCM on board  Hx of TIA  06/2020 admitted to [facility] for confusion and slurry speech with slump over and left facial droop. CTA head and neck showed right ICA bulb string sign. EF 60-65%, LDL 61 and A1C 5.9.   Hx of Hypertension Hypotension   Home BP meds: metoprolol  Current BP meds: phenylephrine -> levophed  SBP stable now . Goal SBP 120 - 140 now per IR . Long-term BP goal normotensive  Hyperlipidemia  Home Lipid lowering medication: Lipitor 40 mg daily  LDL 26, goal < 70  Current lipid lowering medication: Resume statin once p.o. access  Continue statin at discharge  Dysphagia   N.p.o. now  On IVF  Did not pass swallow screen  Speech on board  Other Stroke Risk Factors  Advanced age  Former cigarette smoker - quit 47 years ago  ETOH use, pt has 1 drink per week  Coronary artery disease  Other Active Problems   Code status - DNR  Aortic Atherosclerosis (ICD10-I70.0).   Anemia - Hgb - 8.8->7.9 CKD - stage 3b - creatinine - 1.30->1.23  Dementia - lives with son   Hospital day # 1  This patient is critically ill due to right MCA stroke, right ICA high-grade stenosis, bacteremia, fever and leukocytosis, A. fib, pacemaker, dysphagia and at significant risk of neurological worsening, death form recurrent stroke,  hemorrhagic conversion, sepsis, septic shock, heart failure, cardio arrest. This patient's care requires constant monitoring of vital signs, hemodynamics, respiratory and cardiac monitoring, review of multiple databases, neurological assessment, discussion with family, other specialists and medical decision making of high complexity. I  spent 40 minutes of neurocritical care time in the care of this patient. I had long discussion with daughter over the phone, updated pt current condition, treatment plan and potential prognosis, and answered all the questions.  She expressed understanding and appreciation, and requested CODE STATUS set to DNR.    Rosalin Hawking, MD PhD Stroke Neurology 10/07/2020 9:06 PM  To contact Stroke Continuity provider, please refer to [email or web address] After hours, contact General Neurology

## 2020-10-07 NOTE — Progress Notes (Signed)
Paged Neuro MD Lorrin Goodell. Updated MD about patient being unstable. Patient had to be bagged to bring oxygen saturation up. I have been titrating patient's pressor. I also brought up that patient appears to have a pacemaker of some sort. All of this led me to inquire whether he wants the MRI tonight. MD said MRI not needed this shift, but to try to get head CT if patient becomes stable enough. I also updated MD about patient's neuro status. Patient started responding to pain around 0330. Until that time, patient was unresponsive, despite no sedation infusing. Per anesthesia, patient was given a paralytic approx 10 minutes before arrival to 4N. Will continue to monitor.

## 2020-10-07 NOTE — Anesthesia Preprocedure Evaluation (Addendum)
Anesthesia Evaluation  Patient identified by MRN, date of birth, ID band Patient awake    Reviewed: Allergy & Precautions, NPO status , Patient's Chart, lab work & pertinent test resultsPreop documentation limited or incomplete due to emergent nature of procedure.  Airway Mallampati: II  TM Distance: >3 FB     Dental   Pulmonary former smoker,    breath sounds clear to auscultation       Cardiovascular hypertension, + CAD  + pacemaker  Rhythm:Regular Rate:Normal     Neuro/Psych Anxiety Depression CVA    GI/Hepatic negative GI ROS, Neg liver ROS,   Endo/Other  Hypothyroidism   Renal/GU negative Renal ROS     Musculoskeletal  (+) Arthritis ,   Abdominal   Peds  Hematology  (+) anemia ,   Anesthesia Other Findings   Reproductive/Obstetrics                             Anesthesia Physical Anesthesia Plan  ASA: III  Anesthesia Plan: General   Post-op Pain Management:    Induction: Intravenous  PONV Risk Score and Plan: 2 and Ondansetron  Airway Management Planned: Oral ETT  Additional Equipment:   Intra-op Plan:   Post-operative Plan: Post-operative intubation/ventilation  Informed Consent: I have reviewed the patients History and Physical, chart, labs and discussed the procedure including the risks, benefits and alternatives for the proposed anesthesia with the patient or authorized representative who has indicated his/her understanding and acceptance.       Plan Discussed with: Anesthesiologist and CRNA  Anesthesia Plan Comments:         Anesthesia Quick Evaluation

## 2020-10-07 NOTE — Progress Notes (Signed)
NAME:  Fernando [name], MRN:  [id number], DOB:  10-Jan-1927, LOS: 1 ADMISSION DATE:  10/06/2020, CONSULTATION DATE: 10/07/2020 REFERRING MD:  Dr. Leonel Ramsay, Neurology, CHIEF COMPLAINT:  Stroke   Brief History   86 yo male former smoker presented with Lt sided weakness, aphasia, and altered mental status.  Found to have large Rt MCA stenosis and had stenting.  Required intubation for procedure.  Noted to have fever 101.72F and found to have Streptococcus agalactiae bacteremia.  Past Medical History  A fib on xarelto, carotid artery disease s/p Lt CEA, CAD, Squamous cell carcinoma of parotic, tachy-brady syndrome s/p PM, HTN, HLD, Hypothyroidism  Significant Hospital Events   11/12 Admit, cerebral angiogram with angioplasty  Consults:    Procedures:  ETT 11/12 >>11/13  Significant Diagnostic Tests:   CT head 11/12 >> hyperdense Rt MCA, chronic Lt cerebellar infarct, mild chronic encephalomalacia Rt occipital pole, chronic lacunar infarct Lt thalamus  CT angio head/neck 11/12 >> string sign of proximal Rt ICA, high grade proxomal Rt ICA stenosis, severe Rt P2 stenosis  Echo 11/13 >>  Micro Data:  COVID/Flu 11/12 >> negative Blood 11/12 >> Streptococcus agalactiae  MRSA PCR 11/13 >> negative Sputum 11/13 >>   Antimicrobials:  Zithromax 11/12 Cefepime 11/12 Flagyl 11/12  Vancomycin 11/12  Rocephin 11/12 >>   Interim history/subjective:  Off sedation.  Good Vt with SBT.  Remains on pressors.  No further fever.  Objective   Blood pressure 118/75, pulse 66, temperature (!) 97.3 F (36.3 C), temperature source Axillary, resp. rate (!) 23, height 5\' 5"  (1.651 m), weight 64.9 kg, SpO2 100 %.    Vent Mode: PRVC FiO2 (%):  [100 %] 100 % Set Rate:  [16 bmp] 16 bmp Vt Set:  [490 mL] 490 mL PEEP:  [5 cmH20-10 cmH20] 10 cmH20 Plateau Pressure:  [19 cmH20-23 cmH20] 23 cmH20   Intake/Output Summary (Last 24 hours) at 10/07/2020 0819 Last data filed at 10/07/2020 0700 Gross per  24 hour  Intake 4556.38 ml  Output 1820 ml  Net 2736.38 ml   Filed Weights   10/06/20 1300  Weight: 64.9 kg    Examination:  General - alert Eyes - pupils reactive ENT - ETT in place Cardiac - irregular, no murmur Chest - equal breath sounds b/l, no wheezing or rales Abdomen - soft, non tender, + bowel sounds Extremities - no cyanosis, clubbing, or edema Skin - no rashes Neuro - RASS 0, follows commands, moves extremities  Resolved Hospital Problem list   Community acquired pneumonia ruled out, Lactic acidosis  Assessment & Plan:    Compromised airway in setting of CVA. Rt pleural effusion. - proceed with extubation 11/13 - goal SpO2 > 92% - f/u CXR 11/14  Streptococcal agalactiae bacteremia. - day 2 of ABx - f/u Echo  Acute CVA with Rt MCA stenosis s/p angioplasty. - continue ASA, brilinta  Persistent A fib, HTN, HLD. - goal SBP 120 to 140 per neuro IR - on xarelto as outpt; resume anticoagulation when okay with neurology - continue lipitor - hold outpt lopressor for now  Hx of hypothyroidism. - continue synthroid  Hyperglycemia. - SSI  Hypokalemia. - replace as needed  Anemia of critical illness. - f/u CBC - transfuse for Hb < 7 or significant bleeding  Best practice:  Diet: NPO DVT prophylaxis: SCDs GI prophylaxis: Protonix Mobility: Bed rest Code Status: Full code Disposition: ICU  D/w Dr. Erlinda Hong.  Labs    CMP Latest Ref Rng & Units 10/07/2020 10/07/2020 10/06/2020  Glucose 70 - 99 mg/dL 110(H) - 145(H)  BUN 8 - 23 mg/dL 32(H) - 32(H)  Creatinine 0.61 - 1.24 mg/dL 1.23 - 1.30(H)  Sodium 135 - 145 mmol/L 140 145 142  Potassium 3.5 - 5.1 mmol/L 3.2(L) 3.7 3.7  Chloride 98 - 111 mmol/L 114(H) - 109  CO2 22 - 32 mmol/L 16(L) - -  Calcium 8.9 - 10.3 mg/dL 7.4(L) - -  Total Protein 6.5 - 8.1 g/dL - - -  Total Bilirubin 0.3 - 1.2 mg/dL - - -  Alkaline Phos 38 - 126 U/L - - -  AST 15 - 41 U/L - - -  ALT 0 - 44 U/L - - -    CBC Latest Ref  Rng & Units 10/07/2020 10/07/2020 10/06/2020  WBC 4.0 - 10.5 K/uL 17.1(H) - -  Hemoglobin 13.0 - 17.0 g/dL 7.9(L) 8.8(L) 9.5(L)  Hematocrit 39 - 52 % 25.6(L) 26.0(L) 28.0(L)  Platelets 150 - 400 K/uL 196 - -    ABG    Component Value Date/Time   PHART 7.339 (L) 10/07/2020 0242   PCO2ART 33.4 10/07/2020 0242   PO2ART 280 (H) 10/07/2020 0242   HCO3 18.3 (L) 10/07/2020 0242   TCO2 19 (L) 10/07/2020 0242   ACIDBASEDEF 7.0 (H) 10/07/2020 0242   O2SAT 100.0 10/07/2020 0242    CBG (last 3)  Recent Labs    10/07/20 0132 10/07/20 0325 10/07/20 0719  GLUCAP 143* 132* 98    Critical care time: 42 minutes  Chesley Mires, MD [place] Pager - [phone] 10/07/2020, 8:35 AM

## 2020-10-07 NOTE — Progress Notes (Signed)
PHARMACY - PHYSICIAN COMMUNICATION CRITICAL VALUE ALERT - BLOOD CULTURE IDENTIFICATION (BCID)  Fernando [name] is an 86 y.o. male who presented to [facility] on 10/06/2020 with a chief complaint of weakness >> tx'd to [facility] for mechanical intervention.  Assessment:  Started on broad-spectrum ABX vanc/cefepime for possible sepsis, changed to ceftriaxone/azithromycin for CAP, now w/ 3/4 blood cx bottles growing group B strep.  Name of physician (or Provider) Contacted: SSommer  Current antibiotics: Rocephin and azithromycin  Changes to prescribed antibiotics recommended:  Recommendations accepted by provider -- continue Rocephin, d/c azithro.  Results for orders placed or performed during the hospital encounter of 10/06/20  Blood Culture ID Panel (Reflexed) (Collected: 10/06/2020 10:54 AM)  Result Value Ref Range   Enterococcus faecalis NOT DETECTED NOT DETECTED   Enterococcus Faecium NOT DETECTED NOT DETECTED   Listeria monocytogenes NOT DETECTED NOT DETECTED   Staphylococcus species NOT DETECTED NOT DETECTED   Staphylococcus aureus (BCID) NOT DETECTED NOT DETECTED   Staphylococcus epidermidis NOT DETECTED NOT DETECTED   Staphylococcus lugdunensis NOT DETECTED NOT DETECTED   Streptococcus species DETECTED (A) NOT DETECTED   Streptococcus agalactiae DETECTED (A) NOT DETECTED   Streptococcus pneumoniae NOT DETECTED NOT DETECTED   Streptococcus pyogenes NOT DETECTED NOT DETECTED   A.calcoaceticus-baumannii NOT DETECTED NOT DETECTED   Bacteroides fragilis NOT DETECTED NOT DETECTED   Enterobacterales NOT DETECTED NOT DETECTED   Enterobacter cloacae complex NOT DETECTED NOT DETECTED   Escherichia coli NOT DETECTED NOT DETECTED   Klebsiella aerogenes NOT DETECTED NOT DETECTED   Klebsiella oxytoca NOT DETECTED NOT DETECTED   Klebsiella pneumoniae NOT DETECTED NOT DETECTED   Proteus species NOT DETECTED NOT DETECTED   Salmonella species NOT DETECTED NOT DETECTED   Serratia marcescens NOT  DETECTED NOT DETECTED   Haemophilus influenzae NOT DETECTED NOT DETECTED   Neisseria meningitidis NOT DETECTED NOT DETECTED   Pseudomonas aeruginosa NOT DETECTED NOT DETECTED   Stenotrophomonas maltophilia NOT DETECTED NOT DETECTED   Candida albicans NOT DETECTED NOT DETECTED   Candida auris NOT DETECTED NOT DETECTED   Candida glabrata NOT DETECTED NOT DETECTED   Candida krusei NOT DETECTED NOT DETECTED   Candida parapsilosis NOT DETECTED NOT DETECTED   Candida tropicalis NOT DETECTED NOT DETECTED   Cryptococcus neoformans/gattii NOT DETECTED NOT DETECTED    Wynona Neat, PharmD, BCPS  10/07/2020  5:27 AM

## 2020-10-07 NOTE — Transfer of Care (Addendum)
Immediate Anesthesia Transfer of Care Note  Patient: Fernando [name]  Procedure(s) Performed: IR WITH ANESTHESIA (N/A )  Patient Location: ICU  Anesthesia Type:General  Level of Consciousness: Patient remains intubated per anesthesia plan  Airway & Oxygen Therapy: Patient remains intubated per anesthesia plan and Patient placed on Ventilator (see vital sign flow sheet for setting)  Post-op Assessment: Report given to RN and Post -op Vital signs reviewed and stable  Post vital signs: Reviewed and stable  Last Vitals:  Vitals Value Taken Time  BP 121/70 10/07/20 0011  Temp    Pulse 60 10/07/20 0018  Resp 16 10/07/20 0018  SpO2 100 % 10/07/20 0018  Vitals shown include unvalidated device data.  Last Pain:  Vitals:   10/06/20 1454  TempSrc: Rectal  PainSc:      Report to Roderic Palau RN in ICU, patient remained on ventilator , Wendy RT at bedside, Neo gtt and propofol gtt maintained, foley maintained, VSS, full report given, transfer of patient in safe and stable condition. Wagner MD SBP goals of 120-140 mmHg and BP within goal range.     Complications: No complications documented.

## 2020-10-07 NOTE — Consult Note (Addendum)
NAME:  Fernando [name], MRN:  [id number], DOB:  1927/01/12, LOS: 1 ADMISSION DATE:  10/06/2020, CONSULTATION DATE:  10/07/20  REFERRING MD:  [name], *, CHIEF COMPLAINT: Right MCA CVA    Brief History   Fernando [name] is a 86 y.o. male with altered mental status, left-sided weakness and aphasia found to have large right MCA stenosis s/p stenting.   History of present illness   Fernando [name] is a 86 y.o. male with history of Afib on Eliquis, carotid artery disease s/p left CEA, CAD, squamous cell carcinoma of the parotid, SSS s/p PPM placement and HTN who presents with altered mental status, left-sided weakness and aphasia found to have large right MCA stenosis s/p stenting.   He initially presented to the ED with altered mental status and shortness of breath. He was febrile to 101.76F and hypoxic requiring 2L supplemental oxygen. Initial concern was for infectious etiology in the setting of leukocytosis (WBC 17.1) and lactic acidosis (5.0>4.8). BNP was elevated at 1097 (no baseline for comparison). He received broad spectrum antibiotics while in the ED for sepsis of suspected pulmonary origin. He also received 2L IVF bolus followed by LR infusion. CXR obtained in the ED showed interstitial prominence most concerning for interstitial pulmonary edema. Covid negative. U/A showed blood with negative nitrites and leukocytes.   During his ED stay, he developed acute neurologic deterioration and CT head showed abnormal findings throughout the right MCA territory without large vessel occlusion without core infarct. Neurology was consulted and IR was engaged for intervention. Stenting was successfully performed prior to transfer to the ICU. He was transferred from the OR intubated and paralyzed. Continuous cangrelor infusing on arrival.  Past Medical History  Afib on Eliquis CAD Carotid artery stenosis s/p L CEA SCC of parotid SSS s/p PPM HTN  Significant Hospital Events   IR-guided R  ICA stent (11/12)  Consults:  Neurology PCCM  Procedures:  IR-guided R ICA stent (11/12)  Significant Diagnostic Tests:  CT head code stroke (11/12): IMPRESSION: 1. No emergent large vessel occlusion. 2. Persistent radiographic string sign of the proximal right ICA. 3. Delayed transit throughout the entire right cerebral hemisphere and left ACA territory related to the high-grade proximal right ICA stenosis and chronically diminutive/diseased left ACA. No core infarct identified by CTP. 4. Severe right P2 stenosis. 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 6. Bilateral ground-glass lung opacities which could reflect edema or infection. Moderate right pleural effusion. 7.  Aortic Atherosclerosis (ICD10-I70.0).  Micro Data:  Blood cxs (11/12) pending Trach asp (11/12) pending Urine cx (11/12) pending  Antimicrobials:  Vanc, cefepime, Flagyl (11/12) Ceftriaxone, azithromycin (11/13-present)   Objective   Blood pressure 121/70, pulse 72, temperature (!) 101.1 F (38.4 C), temperature source Rectal, resp. rate 16, weight 64.9 kg, SpO2 100 %.    FiO2 (%):  [100 %] 100 %   Intake/Output Summary (Last 24 hours) at 10/07/2020 0017 Last data filed at 10/06/2020 2217 Gross per 24 hour  Intake 3000 ml  Output 620 ml  Net 2380 ml   Filed Weights   10/06/20 1300  Weight: 64.9 kg    Examination: General: intubated, paralyzed HENT: [place]/AT, pupils 61m, briskly reactive and equal Lungs: bilateral crackles, no wheezing Cardiovascular: RRR, no M/R/G, PPM intact over left chest Abdomen: soft, non-distended, NABS Extremities: no C/C/E Neuro: unable to assess GU: Foley in place  Assessment & Plan:  1. 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 Acute hypoxic respiratory failure 3. Sepsis 2/2 CAP suspected  4. Lactic acidosis  - Vent settings: mode PRVC, VT 8 cc/kg IBW, PEEP 5, RR 16  - Wean FiO2 as able  - F/u blood gas - Wean propofol once paralytics wear off - Fentanyl  pushes PRN for RASS goal 0 - Transition to ceftriaxone, azithromycin for sepsis 2/2 CAP, no criteria met for broad-spectrum antibiotics - Collect tracheal aspirate, MRSA nasal swab; f/u blood cxs - Trend lactate, would avoid aggressive volume resuscitation given h/o diastolic dysfunction - If blood pressure tolerates, early diuresis would expedite time to extubation - Agree with TTE - BP control per Neurology; peripheral phenylephrine and clevidipine ordered to maintain BP within strict parameters - Antiplatelets/anticoagulants per Neuro - Neuro checks - Continue home PPI - Continue home Synthroid  Best practice:  Diet: NPO Pain/Anxiety/Delirium protocol (if indicated): PRN fentanyl, wean propofol as able VAP protocol (if indicated): per protocol DVT prophylaxis: per Neuro GI prophylaxis: PPI (per home) Glucose control: SSI Mobility: Ad lib Code Status: Full Disposition: ICU  Labs   CBC: Recent Labs  Lab 10/06/20 1042 10/06/20 1738  WBC 17.1*  --   NEUTROABS 15.0*  --   HGB 9.1* 9.5*  HCT 30.1* 28.0*  MCV 76.2*  --   PLT 242  --     Basic Metabolic Panel: Recent Labs  Lab 10/06/20 1042 10/06/20 1738  NA 138 142  K 3.8 3.7  CL 102 109  CO2 20*  --   GLUCOSE 172* 145*  BUN 37* 32*  CREATININE 1.49* 1.30*  CALCIUM 9.1  --    GFR: Estimated Creatinine Clearance: 31 mL/min (A) (by C-G formula based on SCr of 1.3 mg/dL (H)). Recent Labs  Lab 10/06/20 1042 10/06/20 1351  WBC 17.1*  --   LATICACIDVEN 5.0* 4.8*    Liver Function Tests: Recent Labs  Lab 10/06/20 1042  AST 29  ALT 18  ALKPHOS 62  BILITOT 1.0  PROT 7.8  ALBUMIN 3.7   No results for input(s): LIPASE, AMYLASE in the last 168 hours. No results for input(s): AMMONIA in the last 168 hours.  ABG    Component Value Date/Time   HCO3 20.7 10/06/2020 1042   TCO2 19 (L) 10/06/2020 1738   ACIDBASEDEF 4.1 (H) 10/06/2020 1042   O2SAT 79.7 10/06/2020 1042     Coagulation Profile: Recent Labs   Lab 10/06/20 1727  INR 1.5*    Cardiac Enzymes: No results for input(s): CKTOTAL, CKMB, CKMBINDEX, TROPONINI in the last 168 hours.  HbA1C: Hgb A1c MFr Bld  Date/Time Value Ref Range Status  07/03/2020 04:20 AM 5.9 (H) 4.8 - 5.6 % Final    Comment:    (NOTE) Pre diabetes:          5.7%-6.4%  Diabetes:              >6.4%  Glycemic control for   <7.0% adults with diabetes     CBG: Recent Labs  Lab 10/06/20 1712  GLUCAP 132*    Review of Systems:   Unable to assess given patient status.  Past Medical History  He,  has a past medical history of Arthritis, Atrial fibrillation ([place]), Carotid artery disease ([place]), Coronary artery disease, Essential hypertension, benign, Hyperlipidemia, Squamous cell carcinoma of parotid ([place]) (01/19/2013), and Tachycardia-bradycardia syndrome ([place]).   Surgical History    Past Surgical History:  Procedure Laterality Date  . CAROTID ENDARTERECTOMY Left Jan. 3, 2009   CE  . CATARACT EXTRACTION W/ INTRAOCULAR LENS IMPLANT     Bilateral  . ESOPHAGOGASTRODUODENOSCOPY  N/A 03/29/2018   Procedure: ESOPHAGOGASTRODUODENOSCOPY (EGD);  Surgeon: Danie Binder, MD;  Location[facility];  Service: Endoscopy;  Laterality: N/A;  . EYE SURGERY    . INSERT / REPLACE / REMOVE PACEMAKER    . PACEMAKER INSERTION  10-01-2012  . PARATHYROIDECTOMY  10/28/2012  . PAROTIDECTOMY  10/28/2012   Procedure: PAROTIDECTOMY;  Surgeon: Ascencion Dike, MD;  Location: [place];  Service: ENT;  Laterality: Right;  Total Parotidectomy  . PERMANENT PACEMAKER INSERTION N/A 10/01/2012   Procedure: PERMANENT PACEMAKER INSERTION;  Surgeon: Evans Lance, MD;  Location: [facility] C[facility] LAB;  Service: Cardiovascular;  Laterality: N/A;     Social History   reports that he quit smoking about 47 years ago. His smoking use included cigarettes. He has never used smokeless tobacco. He reports current alcohol use of about 1.0 standard drink of alcohol per week. He reports that he does not use drugs.    Family History   His family history includes Cancer in his brother and sister; Cirrhosis in his mother; Heart attack in his sister; Heart disease in his sister.   Allergies No Known Allergies   Home Medications  Prior to Admission medications   Medication Sig Start Date End Date Taking? Authorizing Provider  atorvastatin (LIPITOR) 40 MG tablet Take 40 mg by mouth every morning.     [provider]  levothyroxine (SYNTHROID) 50 MCG tablet Take 1 tablet (50 mcg total) by mouth daily before breakfast. 12/26/15   Penland, Kelby Fam, MD  metoprolol tartrate (LOPRESSOR) 25 MG tablet TAKE 1 TABLET TWICE A DAY Patient taking differently: Take 25 mg by mouth 2 (two) times daily.  12/02/13   Evans Lance, MD  ondansetron (ZOFRAN ODT) 4 MG disintegrating tablet 25m ODT q4 hours prn nausea/vomit 05/08/20   ZMilton Ferguson MD  pantoprazole (PROTONIX) 40 MG tablet Take 1 tablet (40 mg total) by mouth 2 (two) times daily before a meal. Twice a day for 3 months and then start taking daily. 03/29/18   MBarton Dubois MD  XARELTO 15 MG TABS tablet TAKE 1 TABLET BY MOUTH ONCE DAILY WITH SUPPER Patient taking differently: Take 15 mg by mouth daily with supper.  04/25/20   TEvans Lance MD     Critical care time: 45 minutes

## 2020-10-07 NOTE — Evaluation (Signed)
Occupational Therapy Evaluation Patient Details Name: Fernando [name] MRN: [id number] DOB: 1927-08-02 Today's Date: 10/07/2020    History of Present Illness The pt is a 86 yo male presenting with L sided weakness, aphasia, and AMS. Upon work-up, found to have large R MCA stenosis and is now s/p stenting. Pt intubated for procedure 11/12 and extubated 11/13. PMH includes: a fib on Xarelto, CAD, HTN, HLD, pacemaker 10/2012.   Clinical Impression   Pt admitted with above diagnoses, presenting with L sided weakness, unintelligible speech, and decreased activity tolerance that limit ability to engage in ADL at desired level of independence. Unsure of exact PLOF due to pts limited ability to report and no family present. Per chart review, pt lives with family members and is independent for ADL and uses RW. At time of eval, pt able to complete bed mobility with mod A +2 and sit <> stands with max A +2. He was able to pivot over to recliner, with notable L lean/weakness. LUE appears generally weak with coordination deficits and some inattention. Pt also with L lateral lean in sitting with minimal awareness. Given current status, recommend CIR at d/c for functional progression of ADLs prior to d/c home with family. Will continue to follow per POC listed below.     Follow Up Recommendations  CIR;Supervision/Assistance - 24 hour    Equipment Recommendations  None recommended by OT    Recommendations for Other Services Rehab consult     Precautions / Restrictions Precautions Precautions: Fall Precaution Comments: pt with multiple bruises, unable to state if due to falls at home or not Restrictions Weight Bearing Restrictions: No      Mobility Bed Mobility Overal bed mobility: Needs Assistance Bed Mobility: Supine to Sit     Supine to sit: Mod assist;+2 for physical assistance     General bed mobility comments: modA to BLE and to raise trunk from [facility]. Use of bed pads to control hips from  sliding off of EOB    Transfers Overall transfer level: Needs assistance Equipment used: 2 person hand held assist Transfers: Sit to/from [facility] Sit to Stand: Max assist;+2 physical assistance;+2 safety/equipment Stand pivot transfers: Max assist;+2 physical assistance;+2 safety/equipment       General transfer comment: maxA to stand with HHA of 2, pt with strong L lean continued in standing. MaxA to initiate steps to pivot to recliner. Required facilitation at hips to initiate safe descent for sitting    Balance Overall balance assessment: Needs assistance Sitting-balance support: Single extremity supported;Feet supported Sitting balance-Leahy Scale: Poor Sitting balance - Comments: reliant on UE support and mod/maxA of 1 Postural control: Left lateral lean;Posterior lean Standing balance support: Bilateral upper extremity supported Standing balance-Leahy Scale: Zero Standing balance comment: reliant on maxA of 2 to maintain static stance                           ADL either performed or assessed with clinical judgement   ADL Overall ADL's : Needs assistance/impaired Eating/Feeding: NPO   Grooming: Minimal assistance;Moderate assistance;Sitting   Upper Body Bathing: Moderate assistance;Sitting   Lower Body Bathing: Maximal assistance;Sitting/lateral leans;Sit to/from stand   Upper Body Dressing : Moderate assistance;Sitting   Lower Body Dressing: Maximal assistance;Sitting/lateral leans;Sit to/from stand   Toilet Transfer: Maximal assistance;+2 for physical assistance;+2 for safety/equipment;Stand-pivot;BSC Toilet Transfer Details (indicate cue type and reason): assist to rise and steady with increased L sided assist due to weakness Toileting- Clothing Manipulation and  Hygiene: Total assistance       Functional mobility during ADLs: Maximal assistance;+2 for physical assistance;+2 for safety/equipment;Cueing for safety;Cueing for  sequencing (stand pivot)       Vision Patient Visual Report: No change from baseline Vision Assessment?: Vision impaired- to be further tested in functional context Additional Comments: appears to have difficulty tracking or recognizing items in L visual field unless given multimodal cues. Difficult to fully assess due to impaired communication/cognition. Suspect L inattention?     Perception     Praxis      Pertinent Vitals/Pain Pain Assessment: Faces Faces Pain Scale: Hurts a little bit Pain Location: generalized, pt stating "I'm alright" despite instances of slight grimace Pain Descriptors / Indicators: Grimacing Pain Intervention(s): Monitored during session;Repositioned     Hand Dominance     Extremity/Trunk Assessment Upper Extremity Assessment Upper Extremity Assessment: Generalized weakness;LUE deficits/detail;Difficult to assess due to impaired cognition LUE Deficits / Details: poor coordination and diffuse weakness; not able to full target objects when asked >90s of shoulder flexion. Appears to have inattention to this UE   Lower Extremity Assessment Lower Extremity Assessment: Defer to PT evaluation LLE Deficits / Details: able to move through full ROM with assist, partial ROM without assist. no noted knee buckling with stance   Cervical / Trunk Assessment Cervical / Trunk Assessment: Other exceptions Cervical / Trunk Exceptions: L lateral lean in sitting, able to correct initialyl when cued, but decreased ability to correct with fatigue   Communication Communication Communication: HOH   Cognition Arousal/Alertness: Awake/alert Behavior During Therapy: WFL for tasks assessed/performed Overall Cognitive Status: Difficult to assess Area of Impairment: Following commands;Orientation;Safety/judgement;Problem solving                 Orientation Level: Time (able to state he is in [place] and they think he might have had a stroke)     Following Commands: Follows  one step commands with increased time (repetition) Safety/Judgement: Decreased awareness of safety;Decreased awareness of deficits   Problem Solving: Slow processing;Requires verbal cues;Requires tactile cues General Comments: difficult to fully assess due to pt being [facility] and with low tone garbled speech. Requires increased time and repetition to follow basic mobility commands   General Comments  multile areas of bruising, pt not able to state cause    Exercises Exercises: General Lower Extremity General Exercises - Lower Extremity Long Arc Quad: AROM;Both;5 reps;Seated   Shoulder Instructions      Home Living Family/patient expects to be discharged to:: Private residence Living Arrangements: Children Available Help at Discharge: Family Type of Home: House Home Access: Stairs to enter Technical brewer of Steps: 5 Entrance Stairs-Rails: Right;Left Home Layout: One level     Bathroom Shower/Tub: Teacher, early years/pre: Handicapped height Bathroom Accessibility: Yes   Home Equipment: Environmental consultant - 2 wheels;Cane - single point;Bedside commode;Shower seat - built in   Additional Comments: information from previous charting, pt unable to state clear answers to history questions or consistently answer yes/no. No family present at eval      Prior Functioning/Environment Level of Independence: Needs assistance        Comments: Per neurology note, pt received assist for ADLs and home care such as cooking, but pt did not need assist for mobility. Pt gave inconsistent answers during history in session        OT Problem List: Decreased strength;Decreased knowledge of use of DME or AE;Impaired vision/perception;Decreased knowledge of precautions;Decreased coordination;Decreased activity tolerance;Decreased cognition;Cardiopulmonary status limiting activity;Impaired UE functional  use;Impaired balance (sitting and/or standing);Decreased safety awareness      OT  Treatment/Interventions: Self-care/ADL training;Therapeutic exercise;Visual/perceptual remediation/compensation;Patient/family education;Balance training;Neuromuscular education;Energy conservation;Therapeutic activities;DME and/or AE instruction;Cognitive remediation/compensation    OT Goals(Current goals can be found in the care plan section) Acute Rehab OT Goals Patient Stated Goal: stand up OT Goal Formulation: With patient Time For Goal Achievement: 10/21/20 Potential to Achieve Goals: Good  OT Frequency: Min 2X/week   Barriers to D/C:            Co-evaluation PT/OT/SLP Co-Evaluation/Treatment: Yes Reason for Co-Treatment: For patient/therapist safety;To address functional/ADL transfers PT goals addressed during session: Mobility/safety with mobility;Balance;Strengthening/ROM OT goals addressed during session: ADL's and self-care;Proper use of Adaptive equipment and DME;Strengthening/ROM      AM-PAC OT "6 Clicks" Daily Activity     Outcome Measure Help from another person eating meals?: Total (NPO) Help from another person taking care of personal grooming?: A Little Help from another person toileting, which includes using toliet, bedpan, or urinal?: Total Help from another person bathing (including washing, rinsing, drying)?: A Lot Help from another person to put on and taking off regular upper body clothing?: A Lot Help from another person to put on and taking off regular lower body clothing?: Total 6 Click Score: 10   End of Session Nurse Communication: Mobility status  Activity Tolerance: Patient tolerated treatment well Patient left: in chair;with call bell/phone within reach;with chair alarm set  OT Visit Diagnosis: Unsteadiness on feet (R26.81);Low vision, both eyes (H54.2);Muscle weakness (generalized) (M62.81);Other symptoms and signs involving cognitive function                Time: 1510-1535 OT Time Calculation (min): 25 min Charges:  OT General Charges $OT  Visit: 1 Visit OT Evaluation $OT Eval Moderate Complexity: [place], MSOT, OTR/L Acute Rehabilitation Services [facility] Office Number: [phone] Pager: [phone]  Zenovia Jarred 10/07/2020, 5:06 PM

## 2020-10-07 NOTE — Evaluation (Addendum)
Physical Therapy Evaluation Patient Details Name: Fernando [name] MRN: [id number] DOB: Nov 27, 1926 Today's Date: 10/07/2020   History of Present Illness  The pt is a 86 yo male presenting with L sided weakness, aphasia, and AMS. Upon work-up, found to have large R MCA stenosis and is now s/p stenting. Pt intubated for procedure 11/12 and extubated 11/13. PMH includes: a fib on Xarelto, CAD, HTN, HLD, pacemaker 10/2012.  Clinical Impression  Pt in bed upon arrival of PT, agreeable to evaluation at this time. The pt was unable to give consistent answers to history questions at time of eval, per pt chart he lives with his family who help with ADLs but not mobility. The pt now presents with limitations in functional mobility, strength, coordination, and seated/standing stability due to above dx, and will continue to benefit from skilled PT to address these deficits. The pt was able to demo isolated movement in all 4 extremities, but struggled with coordination of movement, even in RLE to facilitate bed mobility and seated balance. Therefore, the pt required maxA of 2 for transfers and maxA of 1 for seated stability at this time. He is highly motivated and pleasant, but will require continued therapy prior to d/c home to reduce caregiver burden and level of physical assist needed.      Follow Up Recommendations CIR    Equipment Recommendations   (defer to post acute)    Recommendations for Other Services Rehab consult     Precautions / Restrictions Precautions Precautions: Fall Precaution Comments: pt with multiple bruises, unable to state if due to falls at home or not Restrictions Weight Bearing Restrictions: No      Mobility  Bed Mobility Overal bed mobility: Needs Assistance Bed Mobility: Supine to Sit     Supine to sit: Mod assist;+2 for physical assistance     General bed mobility comments: modA to BLE and to raise trunk from [facility]. Use of bed pads to control hips from sliding off  of EOB    Transfers Overall transfer level: Needs assistance Equipment used: 2 person hand held assist Transfers: Sit to/from [facility] Sit to Stand: Max assist;+2 physical assistance;+2 safety/equipment Stand pivot transfers: Max assist;+2 physical assistance;+2 safety/equipment       General transfer comment: maxA to stand with HHA of 2, pt with strong L lean continued in standing. MaxA to initiate steps to pivot to recliner. Required facilitation at hips to initiate safe descent for sitting  Ambulation/Gait Ambulation/Gait assistance: Max assist;+2 physical assistance Gait Distance (Feet): 3 Feet Assistive device: 2 person hand held assist Gait Pattern/deviations: Step-to pattern;Decreased stride length;Narrow base of support     General Gait Details: pt heavily reliant on support or therapists, narrow BOS with poor ability to maintain balance or clear feet for steps. assist to wt shift for stepping   Modified Rankin (Stroke Patients Only) Modified Rankin (Stroke Patients Only) Pre-Morbid Rankin Score: Moderately severe disability Modified Rankin: Severe disability     Balance Overall balance assessment: Needs assistance Sitting-balance support: Single extremity supported;Feet supported Sitting balance-Leahy Scale: Poor Sitting balance - Comments: reliant on UE support and mod/maxA of 1 Postural control: Left lateral lean;Posterior lean Standing balance support: Bilateral upper extremity supported Standing balance-Leahy Scale: Zero Standing balance comment: reliant on maxA of 2 to maintain static stance                             Pertinent Vitals/Pain Pain Assessment: Faces Faces  Pain Scale: Hurts a little bit Pain Location: generalized, pt stating "I'm alright" despite instances of slight grimace Pain Descriptors / Indicators: Grimacing Pain Intervention(s): Monitored during session;Repositioned    Home Living Family/patient expects  to be discharged to:: Private residence Living Arrangements: Children Available Help at Discharge: Family Type of Home: House Home Access: Stairs to enter Entrance Stairs-Rails: Psychiatric nurse of Steps: 5 Home Layout: One level Home Equipment: Environmental consultant - 2 wheels;Cane - single point;Bedside commode;Shower seat - built in Additional Comments: information from previous charting, pt unable to state clear answers to history questions or consistently answer yes/no. No family present at eval    Prior Function Level of Independence: Needs assistance         Comments: Per neurology note, pt received assist for ADLs and home care such as cooking, but pt did not need assist for mobility. Pt gave inconsistent answers during history in session     Hand Dominance        Extremity/Trunk Assessment   Upper Extremity Assessment Upper Extremity Assessment: Generalized weakness;LUE deficits/detail;Difficult to assess due to impaired cognition LUE Deficits / Details: poor coordination and diffuse weakness; not able to full target objects when asked >90s of shoulder flexion. Appears to have inattention to this UE    Lower Extremity Assessment Lower Extremity Assessment: Defer to PT evaluation LLE Deficits / Details: able to move through full ROM with assist, partial ROM without assist. no noted knee buckling with stance    Cervical / Trunk Assessment Cervical / Trunk Assessment: Other exceptions Cervical / Trunk Exceptions: L lateral lean in sitting, able to correct initialyl when cued, but decreased ability to correct with fatigue  Communication   Communication: HOH  Cognition Arousal/Alertness: Awake/alert Behavior During Therapy: WFL for tasks assessed/performed Overall Cognitive Status: Difficult to assess Area of Impairment: Following commands;Orientation;Safety/judgement;Problem solving                 Orientation Level: Time (able to state he is in [place] and  they think he might have had a stroke)     Following Commands: Follows one step commands with increased time (repetition) Safety/Judgement: Decreased awareness of safety;Decreased awareness of deficits   Problem Solving: Slow processing;Requires verbal cues;Requires tactile cues General Comments: difficult to fully assess due to pt being [facility] and with low tone garbled speech. Requires increased time and repetition to follow basic mobility commands      General Comments General comments (skin integrity, edema, etc.): multile areas of bruising, pt not able to state cause    Exercises General Exercises - Lower Extremity Long Arc Quad: AROM;Both;5 reps;Seated   Assessment/Plan    PT Assessment Patient needs continued PT services  PT Problem List Decreased strength;Decreased range of motion;Decreased activity tolerance;Decreased coordination;Decreased balance;Decreased mobility;Decreased knowledge of use of DME;Decreased safety awareness       PT Treatment Interventions DME instruction;Gait training;Stair training;Therapeutic activities;Therapeutic exercise;Balance training;Neuromuscular re-education;Cognitive remediation;Patient/family education    PT Goals (Current goals can be found in the Care Plan section)  Acute Rehab PT Goals Patient Stated Goal: stand up PT Goal Formulation: With patient Time For Goal Achievement: 10/21/20 Potential to Achieve Goals: Good    Frequency Min 4X/week   Barriers to discharge        Co-evaluation PT/OT/SLP Co-Evaluation/Treatment: Yes Reason for Co-Treatment: For patient/therapist safety;To address functional/ADL transfers PT goals addressed during session: Mobility/safety with mobility;Balance;Strengthening/ROM OT goals addressed during session: ADL's and self-care;Proper use of Adaptive equipment and DME;Strengthening/ROM  AM-PAC PT "6 Clicks" Mobility  Outcome Measure Help needed turning from your back to your side while in a flat  bed without using bedrails?: Total Help needed moving from lying on your back to sitting on the side of a flat bed without using bedrails?: Total Help needed moving to and from a bed to a chair (including a wheelchair)?: Total Help needed standing up from a chair using your arms (e.g., wheelchair or bedside chair)?: Total Help needed to walk in hospital room?: Total Help needed climbing 3-5 steps with a railing? : Total 6 Click Score: 6    End of Session Equipment Utilized During Treatment: Oxygen;Gait belt Activity Tolerance: Patient tolerated treatment well;Patient limited by fatigue Patient left: in chair;with call bell/phone within reach Nurse Communication: Mobility status PT Visit Diagnosis: Unsteadiness on feet (R26.81);Muscle weakness (generalized) (M62.81);Other abnormalities of gait and mobility (R26.89);Hemiplegia and hemiparesis Hemiplegia - Right/Left: Left Hemiplegia - dominant/non-dominant: Non-dominant Hemiplegia - caused by: Other cerebrovascular disease (R MCA stenosis)    Time: 6160-7371 PT Time Calculation (min) (ACUTE ONLY): 27 min   Charges:   PT Evaluation $PT Eval Moderate Complexity: 1 Mod          Karma Ganja, PT, DPT   Acute Rehabilitation Department Pager #: [phone]  Otho Bellows 10/07/2020, 5:05 PM

## 2020-10-07 NOTE — Anesthesia Postprocedure Evaluation (Signed)
Anesthesia Post Note  Patient: Fernando [name]  Procedure(s) Performed: IR WITH ANESTHESIA (N/A )     Patient location during evaluation: ICU Anesthesia Type: General Level of consciousness: patient remains intubated per anesthesia plan Pain management: pain level controlled Vital Signs Assessment: post-procedure vital signs reviewed and stable Respiratory status: patient remains intubated per anesthesia plan Postop Assessment: no apparent nausea or vomiting Anesthetic complications: no   No complications documented.  Last Vitals:  Vitals:   10/07/20 0330 10/07/20 0345  BP: 118/77 117/64  Pulse: 64 62  Resp: 16 20  Temp:    SpO2: (!) 84% 100%    Last Pain:  Vitals:   10/07/20 0053  TempSrc: Rectal  PainSc:                  Michele Judy

## 2020-10-07 NOTE — Progress Notes (Signed)
RT obtained ABG on pt with the following results. No changes at this time. RT will continue to monitor.    Results for Fernando [name], Fernando [name] (MRN [id number]) as of 10/07/2020 03:14  Ref. Range 10/07/2020 02:42  Sample type Unknown ARTERIAL  pH, Arterial Latest Ref Range: 7.35 - 7.45  7.339 (L)  pCO2 arterial Latest Ref Range: 32 - 48 mmHg 33.4  pO2, Arterial Latest Ref Range: 83 - 108 mmHg 280 (H)  TCO2 Latest Ref Range: 22 - 32 mmol/L 19 (L)  Acid-base deficit Latest Ref Range: 0.0 - 2.0 mmol/L 7.0 (H)  Bicarbonate Latest Ref Range: 20.0 - 28.0 mmol/L 18.3 (L)  O2 Saturation Latest Units: % 100.0  Patient temperature Unknown 95.7 F  Collection site Unknown art line

## 2020-10-07 NOTE — Progress Notes (Signed)
  Echocardiogram 2D Echocardiogram has been performed.  Fernando [name] 10/07/2020, 3:28 PM

## 2020-10-07 NOTE — Progress Notes (Signed)
Pt transported from 4N27 to CT and back without complication. Pt respiratory status remained stable throughout transport w/no distress noted. RT will continue to monitor.

## 2020-10-07 NOTE — Procedures (Signed)
Extubation Procedure Note  Patient Details:   Name: Kaid Seeberger DOB: 1927/05/31 MRN: [id number]   Airway Documentation:   Patient extubated per orders. Patient had positive cuff leak prior to extubation. Placed on 4lpm nasal cannula. Will continue to monitor. Vent end date: 10/07/20 Vent end time: 0821   Evaluation  O2 sats: stable throughout Complications: No apparent complications Patient did tolerate procedure well. Bilateral Breath Sounds: Rhonchi, Diminished   Yes  Ander Purpura 10/07/2020, 8:22 AM

## 2020-10-08 ENCOUNTER — Inpatient Hospital Stay (HOSPITAL_COMMUNITY): Payer: PPO

## 2020-10-08 DIAGNOSIS — A491 Streptococcal infection, unspecified site: Secondary | ICD-10-CM | POA: Diagnosis not present

## 2020-10-08 DIAGNOSIS — J90 Pleural effusion, not elsewhere classified: Secondary | ICD-10-CM

## 2020-10-08 DIAGNOSIS — R6520 Severe sepsis without septic shock: Secondary | ICD-10-CM

## 2020-10-08 DIAGNOSIS — I4821 Permanent atrial fibrillation: Secondary | ICD-10-CM | POA: Diagnosis not present

## 2020-10-08 DIAGNOSIS — Z950 Presence of cardiac pacemaker: Secondary | ICD-10-CM | POA: Diagnosis not present

## 2020-10-08 DIAGNOSIS — I5020 Unspecified systolic (congestive) heart failure: Secondary | ICD-10-CM

## 2020-10-08 DIAGNOSIS — I63231 Cerebral infarction due to unspecified occlusion or stenosis of right carotid arteries: Secondary | ICD-10-CM

## 2020-10-08 DIAGNOSIS — A419 Sepsis, unspecified organism: Secondary | ICD-10-CM | POA: Diagnosis not present

## 2020-10-08 DIAGNOSIS — R7881 Bacteremia: Secondary | ICD-10-CM

## 2020-10-08 DIAGNOSIS — I6521 Occlusion and stenosis of right carotid artery: Secondary | ICD-10-CM | POA: Diagnosis not present

## 2020-10-08 DIAGNOSIS — I255 Ischemic cardiomyopathy: Secondary | ICD-10-CM

## 2020-10-08 DIAGNOSIS — I4819 Other persistent atrial fibrillation: Secondary | ICD-10-CM | POA: Diagnosis not present

## 2020-10-08 DIAGNOSIS — I5021 Acute systolic (congestive) heart failure: Secondary | ICD-10-CM

## 2020-10-08 DIAGNOSIS — J810 Acute pulmonary edema: Secondary | ICD-10-CM

## 2020-10-08 LAB — CBC
HCT: 27.5 % — ABNORMAL LOW (ref 39.0–52.0)
Hemoglobin: 8.2 g/dL — ABNORMAL LOW (ref 13.0–17.0)
MCH: 22.7 pg — ABNORMAL LOW (ref 26.0–34.0)
MCHC: 29.8 g/dL — ABNORMAL LOW (ref 30.0–36.0)
MCV: 76 fL — ABNORMAL LOW (ref 80.0–100.0)
Platelets: 228 10*3/uL (ref 150–400)
RBC: 3.62 MIL/uL — ABNORMAL LOW (ref 4.22–5.81)
RDW: 18.4 % — ABNORMAL HIGH (ref 11.5–15.5)
WBC: 17.7 10*3/uL — ABNORMAL HIGH (ref 4.0–10.5)
nRBC: 0.3 % — ABNORMAL HIGH (ref 0.0–0.2)

## 2020-10-08 LAB — BASIC METABOLIC PANEL
Anion gap: 12 (ref 5–15)
BUN: 34 mg/dL — ABNORMAL HIGH (ref 8–23)
CO2: 16 mmol/L — ABNORMAL LOW (ref 22–32)
Calcium: 7.9 mg/dL — ABNORMAL LOW (ref 8.9–10.3)
Chloride: 118 mmol/L — ABNORMAL HIGH (ref 98–111)
Creatinine, Ser: 1.48 mg/dL — ABNORMAL HIGH (ref 0.61–1.24)
GFR, Estimated: 44 mL/min — ABNORMAL LOW (ref >60)
Glucose, Bld: 108 mg/dL — ABNORMAL HIGH (ref 70–99)
Potassium: 4.3 mmol/L (ref 3.5–5.1)
Sodium: 146 mmol/L — ABNORMAL HIGH (ref 135–145)

## 2020-10-08 LAB — GLUCOSE, CAPILLARY
Glucose-Capillary: 100 mg/dL — ABNORMAL HIGH (ref 70–99)
Glucose-Capillary: 101 mg/dL — ABNORMAL HIGH (ref 70–99)
Glucose-Capillary: 102 mg/dL — ABNORMAL HIGH (ref 70–99)
Glucose-Capillary: 105 mg/dL — ABNORMAL HIGH (ref 70–99)
Glucose-Capillary: 83 mg/dL (ref 70–99)
Glucose-Capillary: 88 mg/dL (ref 70–99)

## 2020-10-08 LAB — MAGNESIUM: Magnesium: 1.9 mg/dL (ref 1.7–2.4)

## 2020-10-08 MED ORDER — ORAL CARE MOUTH RINSE
15.0000 mL | Freq: Two times a day (BID) | OROMUCOSAL | Status: DC
  Administered 2020-10-08 – 2020-10-13 (×12): 15 mL via OROMUCOSAL

## 2020-10-08 MED ORDER — FUROSEMIDE 10 MG/ML IJ SOLN
40.0000 mg | Freq: Once | INTRAMUSCULAR | Status: AC
  Administered 2020-10-08: 40 mg via INTRAVENOUS
  Filled 2020-10-08: qty 4

## 2020-10-08 MED ORDER — ENOXAPARIN SODIUM 30 MG/0.3ML ~~LOC~~ SOLN
30.0000 mg | SUBCUTANEOUS | Status: DC
  Administered 2020-10-08 – 2020-10-10 (×3): 30 mg via SUBCUTANEOUS
  Filled 2020-10-08 (×3): qty 0.3

## 2020-10-08 NOTE — Progress Notes (Signed)
STROKE TEAM PROGRESS NOTE   INTERVAL HISTORY His RN is at the bedside.  Pt has been tolerating extubation, currently on [place]. However, he does have tachypnea and EF drop from 60-65% in 06/2020 to 40-45% this admission. CXR concerning for pulmonary edema. Will call cardiology for consult. Neurologically stable.   OBJECTIVE Vitals:   10/08/20 0815 10/08/20 0830 10/08/20 0845 10/08/20 0900  BP: 121/72 113/60 118/81 (!) 136/58  Pulse: 90 74 77 87  Resp: (!) 27 (!) 27 (!) 36 (!) 36  Temp:      TempSrc:      SpO2: 96% 98% 96% 95%  Weight:      Height:        CBC:  Recent Labs  Lab 10/06/20 1042 10/06/20 1738 10/07/20 0539 10/08/20 0225  WBC 17.1*  --  17.1* 17.7*  NEUTROABS 15.0*  --   --   --   HGB 9.1*   < > 7.9* 8.2*  HCT 30.1*   < > 25.6* 27.5*  MCV 76.2*  --  74.9* 76.0*  PLT 242  --  196 228   < > = values in this interval not displayed.    Basic Metabolic Panel:  Recent Labs  Lab 10/07/20 0539 10/08/20 0225  NA 140 146*  K 3.2* 4.3  CL 114* 118*  CO2 16* 16*  GLUCOSE 110* 108*  BUN 32* 34*  CREATININE 1.23 1.48*  CALCIUM 7.4* 7.9*  MG  --  1.9    Lipid Panel:     Component Value Date/Time   CHOL 87 10/07/2020 0539   TRIG 42 10/07/2020 0539   TRIG 39 10/07/2020 0539   HDL 53 10/07/2020 0539   CHOLHDL 1.6 10/07/2020 0539   VLDL 8 10/07/2020 0539   LDLCALC 26 10/07/2020 0539   HgbA1c:  Lab Results  Component Value Date   HGBA1C 5.7 (H) 10/07/2020   Urine Drug Screen:     Component Value Date/Time   LABOPIA NONE DETECTED 07/02/2020 1830   COCAINSCRNUR NONE DETECTED 07/02/2020 1830   LABBENZ NONE DETECTED 07/02/2020 1830   AMPHETMU NONE DETECTED 07/02/2020 1830   THCU NONE DETECTED 07/02/2020 1830   LABBARB NONE DETECTED 07/02/2020 1830    Alcohol Level     Component Value Date/Time   ETH <10 10/06/2020 1727    IMAGING  MRI BRAIN WO CONTRAST - pending Pt has PPM?  CT HEAD WO CONTRAST No acute finding by CT. Chronic small-vessel ischemic  changes throughout the brain as outlined above.  CT Code Stroke CTA Head W/WO contrast CT Code Stroke CTA Neck W/WO contrast CT PERFUSION BRAIN 10/06/2020 IMPRESSION:  1. No emergent large vessel occlusion.  2. Persistent radiographic string sign of the proximal right ICA.  3. Delayed transit throughout the entire right cerebral hemisphere and left ACA territory related to the high-grade proximal right ICA stenosis and chronically diminutive/diseased left ACA. No core infarct identified by CTP.  4. Severe right P2 stenosis.  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  6. Bilateral ground-glass lung opacities which could reflect edema or infection. Moderate right pleural effusion.  7.  Aortic Atherosclerosis (ICD10-I70.0).   CT HEAD WO CONTRAST 10/07/2020 IMPRESSION:  No acute finding by CT. Chronic small-vessel ischemic changes throughout the brain as outlined above.   DG Chest Port 1 View 10/06/2020 IMPRESSION:  Enlargement of cardiac silhouette with pulmonary vascular congestion. Increased interstitial markings in the mid to lower lungs question minimal edema or developing chronic interstitial lung disease.   CT HEAD  CODE STROKE WO CONTRAST 10/06/2020 IMPRESSION:  1. Hyperdense Right MCA suspicious for Emergent Large Vessel Occlusion.  2. No acute cortically based infarct or acute intracranial hemorrhage identified. ASPECTS 10.  3. Stable CT appearance of brain parenchyma since August.   Transthoracic Echocardiogram  1. Akinesis of the distal septum and apex; overall mildly reduced LV  function; no thrombus noted using definity.  2. Left ventricular ejection fraction, by estimation, is 40 to 45%. The  left ventricle has mildly decreased function. The left ventricle  demonstrates regional wall motion abnormalities (see scoring  diagram/findings for description). Left ventricular  diastolic function could not be evaluated.  3. Right ventricular systolic function is normal.  The right ventricular  size is normal.  4. Left atrial size was severely dilated.  5. Right atrial size was severely dilated.  6. The mitral valve is normal in structure. Mild mitral valve  regurgitation. No evidence of mitral stenosis.  7. Tricuspid valve regurgitation is moderate.  8. The aortic valve is tricuspid. Aortic valve regurgitation is mild.  Mild aortic valve stenosis.  9. The inferior vena cava is normal in size with greater than 50%  respiratory variability, suggesting right atrial pressure of 3 mmHg.    ECG - atrial fibrillation - ventricular response 78 BPM (See cardiology reading for complete details)   PHYSICAL EXAM  Temp:  [98.5 F (36.9 C)-99.7 F (37.6 C)] 98.5 F (36.9 C) (11/14 0800) Pulse Rate:  [74-113] 87 (11/14 0900) Resp:  [23-49] 36 (11/14 0900) BP: (100-151)/(58-109) 136/58 (11/14 0900) SpO2:  [75 %-100 %] 95 % (11/14 0900) Arterial Line BP: (115)/(58) 115/58 (11/13 1000)  General - Well nourished, well developed, mild SOB with tachypnea  Ophthalmologic - fundi not visualized due to noncooperation.  Cardiovascular - irregularly irregular heart rate and rhythm  Neuro - initially eyes close but easily open on voice, tracking bilaterally. Severe dysarthria with intangible words, not able to cooperate with naming or repeating. Not orientated to age and time. However, able to follow simple commands on the hands and feet. With eye opening, eyes able to gaze bilaterally, not consistently blinking to visual threat bilaterally but seems no visual field deficit on gross confrontation. PERRL. Left facial droop. Tongue protrusion midline. RUE 4/5, LUE 3/5 without drift and finger grip and wrist extension 2/5. BLE 3-/5, able to hold knee flexion and foot on bed position but LLE weaker than left on painful stimulation. No babinski bilaterally. Sensation, coordination not cooperative and gait not tested.   ASSESSMENT/PLAN [name] is a 86 y.o. male  with history of atrial fibrillation on Xarelto, PPM, L CEA, CAD, Htn, HLD, dementia and squamous cell cancer R parotid resected 2014, presenting with left sided weakness. He did not receive IV t-PA due to anticoagulation with Xarelto. Acute right ICA angioplasty with cerebral protection 10/06/20 at 11:28 PM Dr Earleen Newport.  Stroke: R MCA infarct due to right ICA high grade stenosis s/p angioplasty, etiology likely due to large vessel disease. However, given fever and bacteremia, needs to rule out endocarditis   CT Head - Hyperdense Right MCA suspicious for Emergent Large Vessel Occlusion. No acute cortically based infarct or acute intracranial hemorrhage identified. ASPECTS 10.   CTA H&N - Persistent radiographic string sign of the proximal right ICA. Severe right P2 stenosis.   CTP - Delayed transit throughout the entire right cerebral hemisphere and left ACA territory related to the high-grade proximal right ICA stenosis and chronically diminutive/diseased left ACA. No core infarct identified by CTP.  IR - right ICA angioplasty, 86%->41% stenosis post angioplasty  CT head - No acute finding by CT. Chronic small-vessel ischemic changes throughout the brain as outlined above.   MRI head - pending (PPM)  2D Echo - EF 40 to 45% down from 60-65% in 06/2020, akinesis of the distal septum and apex  [employer] Virus 2 - negative  LDL - 26  HgbA1c - 5.7  UDS - not ordered  VTE prophylaxis - Lovenox  Xarelto (rivaroxaban) daily prior to admission, was on aspirin 81 mg daily and Brilinta. Now given no po access, on ASA 300 PR. May consider heparin IV once repeat CT or MRI showing no large infarct.  Patient counseled to be compliant with his antithrombotic medications  Ongoing aggressive stroke risk factor management  Therapy recommendations:  pending  Disposition:  Pending  Carotid stenosis  Hx of left ICA stenosis s/p left CEA  06/2020 CT head and neck showed right ICA bulb string  sign  This admission CT head and neck again showed right ICA bulb string sign, status post angioplasty.  Right ICA 86% -> 41% stenosis post-procedure  Carotid Doppler pending  Chronic afib Tachy-brady syndrome s/p pacer Cardiomyopathy  Pulmonary edema  On Xarelto PTA  Currently on aspirin  Consider resume anticoagulation once no large infarct and without endocarditis  2D Echo - EF 40 to 45% down from 60-65% in 06/2020, akinesis of the distal septum and apex  CXR concerning for pulmonary edema  Still has tachypnea   Will have cardiology consult - pt follows with Dr. Lovena Le  Bacteremia  Fever Leukocytosis  Tmax 101.1->afebrile  On Abx   Blood culture 2/2 G+ cocci  WBC 17.1-> 17.1->17.7  Consider TEE to rule out endocarditis  CCM on board  Hx of TIA  06/2020 admitted to [facility] for confusion and slurry speech with slump over and left facial droop. CTA head and neck showed right ICA bulb string sign. EF 60-65%, LDL 61 and A1C 5.9.   Hx of Hypertension Hypotension   Home BP meds: metoprolol  Current BP meds: phenylephrine -> levophed  SBP stable now . Goal SBP 110 - 140 . Long-term BP goal normotensive  Hyperlipidemia  Home Lipid lowering medication: Lipitor 40 mg daily  LDL 26, goal < 70  Current lipid lowering medication: Resume statin once p.o. access  Continue statin at discharge  Dysphagia   N.p.o. now  On IVF  Did not pass swallow screen  May need cortrak tomorrow  Speech on board  Other Stroke Risk Factors  Advanced age  Former cigarette smoker - quit 47 years ago  ETOH use, pt has 1 drink per week  Coronary artery disease  Other Active Problems   Code status - DNR  Aortic Atherosclerosis (ICD10-I70.0).   Acute blood loss anemia - Hgb - 8.8->7.9->8.2 CKD - stage 3b - creatinine - 1.30->1.23->1.48  Dementia - lives with son   Hospital day # 2  This patient is critically ill due to right MCA stroke, right ICA stenosis s/p  angioplasty, afib, pacer, cardiomyopathy, tachypnea, bacteremia, dysphagia and at significant risk of neurological worsening, death form recurrent stroke, carotid re-stenosis, heart failure, cardiogenic shock, endocarditis, pneumonia, seizure. This patient's care requires constant monitoring of vital signs, hemodynamics, respiratory and cardiac monitoring, review of multiple databases, neurological assessment, discussion with family, other specialists and medical decision making of high complexity. I spent 40 minutes of neurocritical care time in the care of this patient. I have discussed with Dr. Halford Chessman.  I had long discussion with daughter over the phone, updated pt current condition, treatment plan and potential prognosis, and answered all the questions. She expressed understanding and appreciation.   Rosalin Hawking, MD PhD Stroke Neurology 10/08/2020 9:57 AM   To contact Stroke Continuity provider, please refer to [email or web address] After hours, contact General Neurology

## 2020-10-08 NOTE — PMR Pre-admission (Signed)
PMR Admission Coordinator Pre-Admission Assessment  Patient: Fernando [name] is an 86 y.o., male MRN: [id number] DOB: 08-22-1927 Height: 5\' 5"  (165.1 cm) Weight: 70.9 kg              Insurance Information HMO:    PPO:      PCP:      IPA:      80/20:      OTHER:  PRIMARY: [employer]      Policy#: [id number]      Subscriber: Pt.  CM Name: Lynelle Smoke      Phone#: [phone]     Fax#: Epic access Pre-Cert#: [id number] auth for CIR provided by Tammy with [employer].       Employer:  Benefits:  Phone #: [phone]     Name: 11/16 Eff. Date: 11/26/2015     Deduct: none      Out of Pocket Max: $3400      Life Max: none  CIR: $295 co pay per day days 1 until 6      SNF: no copay days 1 until 20; $178 co pay pe rday days 21 until 100 Outpatient: $15 per visit     Co-Pay: visit spe5r medcial neccesity Home Health: 100%      Co-Pay: visits per medcial ncessity DME: 80%     Co-Pay: 20% Providers: in network  SECONDARY: none   The "Data Collection Information Summary" for patients in Inpatient Rehabilitation Facilities with attached "Privacy Act [place] Records" was provided and verbally reviewed with: family  Emergency Contact Information Contact Information    Name Relation Home Work Mobile   [place] Son [phone]  [phone]   [place]ana Obey Daughter [phone]  [phone]     Current Medical History  Patient Admitting Diagnosis: CVA  History of Present Illness:: Werner Labella is a 86 y.o. right-handed male with history of atrial fibrillation maintained on Xarelto, hypertension, CAD/pacemaker, hyperlipidemia, prior left CEA, SCC of the carotid gland and recent TIA August 2021.  He quit smoking 47 years ago.  Per chart review patient lives with his children.  He was receiving assist for ADLs and home care.  1 level home 6 steps to entry.  Presented 10/07/2020 to [facility] left side weakness as well as dysarthria.  Cranial CT scan showed hyperdense right MCA  suspicious for emergent large vessel occlusion.  No acute cortically based infarct or acute intracranial hemorrhage.  Patient did not receive TPA.  Echocardiogram with ejection fraction of 40 to 45%.  Without thrombus.  CTA of head and neck no emergent large vessel occlusion.  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  Delayed transit throughout the entire right cerebral hemisphere and left ACA territory related to high-grade proximal right ICA stenosis and chronically diminutive disease left ACA.  Patient underwent ICA angioplasty per interventional radiology.  He did require intubation for a short time.  Hospital course fever blood cultures 2/2 G+ cocci and currently maintained on Rocephin.  He was cleared to begin aspirin for CVA prophylaxis for right MCA infarct due to right ICA high-grade stenosis.  Lovenox was added for DVT prophylaxis.    Complete NIHSS TOTAL: 12 Glasgow Coma Scale Score: 15  Past Medical History  Past Medical History:  Diagnosis Date  . Arthritis   . Atrial fibrillation ([place])   . Carotid artery disease ([place])    Dr. Kellie Simmering - s/p left CEA  . Coronary artery disease   . Essential hypertension, benign   . Hyperlipidemia   . 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 This mass was initially felt to be 1.7 cm at the time of presentation but had enlarged significantly to 4 cm clinically at the time of surgery. Pathologically it was 3.4 cm. A lymph node deep to the parotid was enlarged clinically at the time of surgery but was negative   . Tachycardia-bradycardia syndrome ([place])     Family History  family history includes Cancer in his brother and sister; Cirrhosis in his mother; Heart attack in his sister; Heart disease in his sister.  Prior Rehab/Hospitalizations:  Has the patient had prior rehab or hospitalizations prior to admission?  Yes  Has the patient had major surgery during 100 days prior to admission? Yes  Current Medications   Current Facility-Administered Medications:  .  acetaminophen (TYLENOL) tablet 650 mg, 650 mg, Oral, Q6H PRN **OR** acetaminophen (TYLENOL) suppository 650 mg, 650 mg, Rectal, Q6H PRN, Lelon Perla, MD .  aspirin EC tablet 81 mg, 81 mg, Oral, Daily, Rosalin Hawking, MD, 81 mg at 10/13/20 0954 .  atorvastatin (LIPITOR) tablet 20 mg, 20 mg, Oral, Daily, Lelon Perla, MD, 20 mg at 10/13/20 0955 .  ceFAZolin (ANCEF) IVPB 2g/100 mL premix, 2 g, Intravenous, Q8H, Pham, Minh Q, RPH-CPP, Last Rate: 200 mL/hr at 10/13/20 0955, 2 g at 10/13/20 0955 .  ferrous sulfate tablet 325 mg, 325 mg, Oral, TID WC, Rosalin Hawking, MD, 325 mg at 10/13/20 0829 .  insulin aspart (novoLOG) injection 0-9 Units, 0-9 Units, Subcutaneous, Q4H, Lelon Perla, MD, 1 Units at 10/12/20 2201 .  levothyroxine (SYNTHROID) tablet 50 mcg, 50 mcg, Oral, Q0600, Lelon Perla, MD, 50 mcg at 10/13/20 0520 .  loperamide (IMODIUM) capsule 2 mg, 2 mg, Oral, Q8H PRN, Rosalin Hawking, MD .  MEDLINE mouth rinse, 15 mL, Mouth Rinse, BID, Lelon Perla, MD, 15 mL at 10/13/20 0956 .  metoprolol succinate (TOPROL-XL) 24 hr tablet 25 mg, 25 mg, Oral, Daily, Rosalin Hawking, MD, 25 mg at 10/13/20 0954 .  pantoprazole sodium (PROTONIX) 40 mg/20 mL oral suspension 40 mg, 40 mg, Oral, BID, Heard, Courtney S, NP, 40 mg at 10/13/20 2376  Patients Current Diet:  Diet Order            DIET - DYS 1 Room service appropriate? Yes with Assist; Fluid consistency: Thin  Diet effective now                MBS 11/15 and began Dysphagia 1 diet with thin liquids, meds crushed with puree.   Precautions / Restrictions Precautions Precautions: Fall Precaution Comments: rectal tube Restrictions Weight Bearing Restrictions: No   Has the patient had 2 or more falls or a fall with injury in the past year?Yes  Prior Activity Level Limited Community  (1-2x/wk): Pt. went out for appointments  but mostly stayed at home.  Since August had good days and bad days. Use RW  Prior Functional Level Prior Function Level of Independence: Needs assistance Comments: Per neurology note, pt received assist for ADLs and home care such as cooking, but pt did not need assist for mobility. Pt gave inconsistent answers during history in session  Self Care: Did the patient need help bathing, dressing, using the toilet or eating?  Independent  Indoor Mobility: Did the patient need assistance with walking from room to room (with or without device)? Independent  Stairs: Did the patient need assistance with  internal or external stairs (with or without device)? Needed some help  Functional Cognition: Did the patient need help planning regular tasks such as shopping or remembering to take medications? Needed some help  Home Assistive Devices / [place] Devices/Equipment: Dentures (specify type), Bedside commode/3-in-1 Home Equipment: Walker - 2 wheels, Cane - single point, Bedside commode, Shower seat - built in  Prior Device Use: Indicate devices/aids used by the patient prior to current illness, exacerbation or injury? Walker just recently. Has his ggod days and his bad days since August 2021  Current Functional Level Cognition  Arousal/Alertness: Lethargic Overall Cognitive Status: Impaired/Different from baseline Difficult to assess due to: Hard of hearing/deaf Current Attention Level: Selective Orientation Level: Oriented to person, Oriented to place, Oriented to situation Following Commands: Follows one step commands with increased time Safety/Judgement: Decreased awareness of safety, Decreased awareness of deficits General Comments: Pt is HOH and sometiimes difficult to assist.  Granddaughter present and reports he is seeming closer to baaseline.  Does require increased time and multimodal cueing Safety/Judgment: Impaired     Extremity Assessment (includes Sensation/Coordination)  Upper Extremity Assessment: Generalized weakness, LUE deficits/detail, Difficult to assess due to impaired cognition LUE Deficits / Details: poor coordination and diffuse weakness; not able to full target objects when asked >90s of shoulder flexion. Appears to have inattention to this UE  Lower Extremity Assessment: Defer to PT evaluation LLE Deficits / Details: able to move through full ROM with assist, partial ROM without assist. no noted knee buckling with stance    ADLs  Overall ADL's : Needs assistance/impaired Eating/Feeding: Supervision/ safety, Set up, Minimal assistance, Sitting Eating/Feeding Details (indicate cue type and reason): light MIN A to facilitate AROM of LUE Grooming: Minimal assistance, Sitting Upper Body Bathing: Moderate assistance, Sitting Lower Body Bathing: Maximal assistance, +2 for physical assistance, +2 for safety/equipment, Sit to/from stand Upper Body Dressing : Moderate assistance, Sitting Lower Body Dressing: Maximal assistance, Sitting/lateral leans, Sit to/from stand Toilet Transfer: Maximal assistance, +2 for physical assistance, +2 for safety/equipment Toilet Transfer Details (indicate cue type and reason): simulated via functional mobility MAX A +2 to pivot to recliner Toileting- Clothing Manipulation and Hygiene: Total assistance, Sit to/from stand Toileting - Clothing Manipulation Details (indicate cue type and reason): for posterior pericare in standing Functional mobility during ADLs: Maximal assistance, +2 for physical assistance (stand pivot transfer) General ADL Comments: pt continues to present with decreased activity tolerance, generalized weakness decreased functional ROM of LUE    Mobility  Overal bed mobility: Needs Assistance Bed Mobility: Supine to Sit Rolling: Mod assist Sidelying to sit: Mod assist, +2 for physical assistance Supine to sit: HOB elevated, Mod assist General  bed mobility comments: Pt requiring assist for L LE to EOB and to lift trunk; good initiation with lifting trunk but did requiring mod A and mod A to scoot forward; required cues for sequencing    Transfers  Overall transfer level: Needs assistance Equipment used: 1 person hand held assist, 2 person hand held assist (back of chari) Transfers: Sit to/from Stand, Risk manager Sit to Stand: Max assist, +2 physical assistance, From elevated surface Stand pivot transfers: Max assist, +2 physical assistance General transfer comment: 3 x sit to stand from elevated bed with max A of 2 to complete. Pt with mod A initially but required max x 2 to come upright and still with difficulty tucking buttock.  Max x 2 pivot to chair toward L side    Ambulation / Gait / Stairs /  Wheelchair Mobility  Ambulation/Gait Ambulation/Gait assistance: Max assist, +2 physical assistance Gait Distance (Feet): 3 Feet Assistive device: Rolling walker (2 wheeled) Gait Pattern/deviations: Step-to pattern, Decreased stride length, Shuffle, Leaning posteriorly, Trunk flexed General Gait Details: NT - DOE 2/4, LE buckling Gait velocity interpretation: <1.31 ft/sec, indicative of household ambulator    Posture / Balance Dynamic Sitting Balance Sitting balance - Comments: close supervision for static sitting; able to do without UE but preferred to have UE support Balance Overall balance assessment: Needs assistance Sitting-balance support: Feet supported, No upper extremity supported, Single extremity supported Sitting balance-Leahy Scale: Fair Sitting balance - Comments: close supervision for static sitting; able to do without UE but preferred to have UE support Postural control: Left lateral lean, Posterior lean Standing balance support: During functional activity Standing balance-Leahy Scale: Zero Standing balance comment: Unable to stand completely upright and required max A x 2; performed 3 stands and 8 partial  stands from chair    Special needs/care consideration Rectal pouch 11/15/2021and external urinary catheter Hgb A1c 5.7 Code status DNR on acute  Rocephin for bacteremia LOT to be determined   Previous Home Environment  Living Arrangements: Children  Lives With: Son Available Help at Discharge: Family Type of Home: House Home Layout: One level Home Access: Stairs to enter Entrance Stairs-Rails: Right, Left Entrance Stairs-Number of Steps: 5 Bathroom Shower/Tub: Chiropodist: Handicapped height Bathroom Accessibility: Yes Home Care Services: No Additional Comments: information from previous charting, pt unable to state clear answers to history questions or consistently answer yes/no. No family present at eval  Discharge Living Setting Does the patient have any problems obtaining your medications?: No  Social/Family/Support Systems Patient Roles: Other (Comment) Contact Information: [phone] Anticipated Caregiver: Biran Mayberry Anticipated Caregiver's Contact Information: [phone] Ability/Limitations of Caregiver:  (Can provide min-mod A) Caregiver Availability: 24/7 Discharge Plan Discussed with Primary Caregiver: Yes Is Caregiver In Agreement with Plan?: Yes Does Caregiver/Family have Issues with Lodging/Transportation while Pt is in Rehab?: No  Goals Patient/Family Goal for Rehab: PT/OT/OSLP mod A (PT/OT/SLP mod A) Expected length of stay: 17-21 days Pt/Family Agrees to Admission and willing to participate: Yes Program Orientation Provided & Reviewed with Pt/Caregiver Including Roles  & Responsibilities: Yes  Decrease burden of Care through IP rehab admission: n/a  Possible need for SNF placement upon discharge: not anticipated   Patient Condition: This patient's medical and functional status has changed since the consult dated: 10/09/2020 in which the Rehabilitation Physician determined and documented that the patient's condition is  appropriate for intensive rehabilitative care in an inpatient rehabilitation facility. See "History of Present Illness" (above) for medical update. Functional changes are: pt max +2 for mobility and adls. Patient's medical and functional status update has been discussed with the Rehabilitation physician and patient remains appropriate for inpatient rehabilitation. Will admit to inpatient rehab today.  Preadmission Screen Completed By: Danne Baxter RN MSN with updates by Michel Santee, PT, 10/13/2020 11:05 AM ______________________________________________________________________   Discussed status with Dr. Letta Pate on 10/13/20 at  11:05 AM  and received approval for admission today.  Admission Coordinator: Danne Baxter RN MSN with updates by Michel Santee, time 11:05 AM Sudie Grumbling 10/13/20

## 2020-10-08 NOTE — Evaluation (Signed)
Clinical/Bedside Swallow Evaluation Patient Details  Name: Fernando [name] MRN: [id number] Date of Birth: Apr 18, 1927  Today's Date: 10/08/2020 Time: SLP Start Time (ACUTE ONLY): 34 SLP Stop Time (ACUTE ONLY): 1245 SLP Time Calculation (min) (ACUTE ONLY): 15 min  Past Medical History:  Past Medical History:  Diagnosis Date  . Arthritis   . Atrial fibrillation ([place] [address])   . Carotid artery disease ([place])    Dr. Kellie Simmering - s/p left CEA  . Coronary artery disease   . Essential hypertension, benign   . Hyperlipidemia   . 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 This mass was initially felt to be 1.7 cm at the time of presentation but had enlarged significantly to 4 cm clinically at the time of surgery. Pathologically it was 3.4 cm. A lymph node deep to the parotid was enlarged clinically at the time of surgery but was negative   . Tachycardia-bradycardia syndrome [facility])    Past Surgical History:  Past Surgical History:  Procedure Laterality Date  . CAROTID ENDARTERECTOMY Left Jan. 3, 2009   CE  . CATARACT EXTRACTION W/ INTRAOCULAR LENS IMPLANT     Bilateral  . ESOPHAGOGASTRODUODENOSCOPY N/A 03/29/2018   Procedure: ESOPHAGOGASTRODUODENOSCOPY (EGD);  Surgeon: Danie Binder, MD;  Location[facility];  Service: Endoscopy;  Laterality: N/A;  . EYE SURGERY    . INSERT / REPLACE / REMOVE PACEMAKER    . PACEMAKER INSERTION  10-01-2012  . PARATHYROIDECTOMY  10/28/2012  . PAROTIDECTOMY  10/28/2012   Procedure: PAROTIDECTOMY;  Surgeon: Ascencion Dike, MD;  Location: [facility];  Service: ENT;  Laterality: Right;  Total Parotidectomy  . PERMANENT PACEMAKER INSERTION N/A 10/01/2012   Procedure: PERMANENT PACEMAKER INSERTION;  Surgeon: Evans Lance, MD;  Location: [facility] CATH LAB;  Service: Cardiovascular;  Laterality: N/A;   HPI:  The pt is a 86 yo male presenting with L  sided weakness and AMS. Upon work-up, found to have large R MCA stenosis and is now s/p stenting. Pt intubated for procedure 11/12 and extubated 11/13. PMH includes: a fib on Xarelto, CAD, HTN, HLD, pacemaker 10/2012.   Assessment / Plan / Recommendation Clinical Impression  Pt was seen for a bedside swallow evaluation and he presents with suspected oropharyngeal dysphagia.  Pt's son was at bedside to provide hx, and he stated that the pt typically does not have any coughing or choking with liquid consumption, but that he has some difficulty consuming regular solids at baseline.  Pt was alert and cooperative throughout this session, and he was observed to have a L facial droop with suspected lingual weakness.   He consumed trials of ice chips, thin liquid, nectar-thick liquid, honey-thick liquid, and puree.  He tolerated small ice chips without difficulty, but he exhibited intermittent immediate and/or delayed coughing with all additional PO trials.  Cough was noted to be weak, and pt had increased work of breathing during PO intake.  He appeared to have decreased lingual control with suspected delayed swallow initiation.  Multiple swallows were observed with most trials, possibly indicative of pharyngeal residue.  Recommend that pt remain NPO at this time with medications administered via alternative means.  The pt may have a few ice chips PRN following thorough oral care and given full RN supervision.  Plan for MBS tomorrow to further evaluate swallow function.  Spoke with pt, son, and RN regarding all recommendations and they verbalized  understanding.    SLP Visit Diagnosis: Dysphagia, oropharyngeal phase (R13.12)    Aspiration Risk  Severe aspiration risk;Risk for inadequate nutrition/hydration    Diet Recommendation NPO;Alternative means - temporary;Ice chips PRN after oral care   Medication Administration: Via alternative means Supervision: Full supervision/cueing for compensatory  strategies Postural Changes: Seated upright at 90 degrees    Other  Recommendations Oral Care Recommendations: Oral care QID;Staff/trained caregiver to provide oral care;Oral care prior to ice chip/H20 Other Recommendations: Remove water pitcher   Follow up Recommendations Inpatient Rehab      Frequency and Duration min 2x/week  2 weeks       Prognosis Prognosis for Safe Diet Advancement: Fair      Swallow Study   General HPI: The pt is a 86 yo male presenting with L sided weakness and AMS. Upon work-up, found to have large R MCA stenosis and is now s/p stenting. Pt intubated for procedure 11/12 and extubated 11/13. PMH includes: a fib on Xarelto, CAD, HTN, HLD, pacemaker 10/2012. Type of Study: Bedside Swallow Evaluation Previous Swallow Assessment: None documented  Diet Prior to this Study: NPO Temperature Spikes Noted: Yes Respiratory Status: Nasal cannula History of Recent Intubation: Yes Length of Intubations (days): 1 days (for procedure ) Date extubated: 10/07/20 Behavior/Cognition: Lethargic/Drowsy;Cooperative;Pleasant mood Oral Cavity Assessment: Within Functional Limits Oral Care Completed by SLP: No Oral Cavity - Dentition: Dentures, top;Dentures, bottom (not worn during this evaluation ) Self-Feeding Abilities: Total assist Patient Positioning: Upright in bed Baseline Vocal Quality: Low vocal intensity Volitional Cough: Weak Volitional Swallow: Able to elicit    Oral/Motor/Sensory Function Overall Oral Motor/Sensory Function: Moderate impairment Facial ROM: Reduced left Facial Symmetry: Abnormal symmetry left Lingual ROM: Reduced right;Reduced left   Ice Chips Ice chips: Within functional limits Presentation: Spoon   Thin Liquid Thin Liquid: Impaired Presentation: Spoon;Straw Oral Phase Impairments: Reduced lingual movement/coordination Pharyngeal  Phase Impairments: Cough - Immediate;Suspected delayed Swallow;Multiple swallows    Nectar Thick Nectar Thick  Liquid: Impaired Presentation: Straw Oral Phase Impairments: Reduced lingual movement/coordination Pharyngeal Phase Impairments: Suspected delayed Swallow;Cough - Immediate;Multiple swallows   Honey Thick Honey Thick Liquid: Impaired Presentation: Straw Oral Phase Impairments: Reduced lingual movement/coordination Pharyngeal Phase Impairments: Suspected delayed Swallow;Multiple swallows;Cough - Immediate   Puree Puree: Impaired Presentation: Spoon Oral Phase Impairments: Reduced lingual movement/coordination Pharyngeal Phase Impairments: Suspected delayed Swallow;Multiple swallows;Cough - Delayed   Solid     Solid: Not tested     Colin Mulders M.S., CCC-SLP Acute Rehabilitation Services Office: [phone]   Elvia Collum Jaliya Siegmann 10/08/2020,1:27 PM

## 2020-10-08 NOTE — Progress Notes (Signed)
Pt has MRI unsafe pacemaker.

## 2020-10-08 NOTE — Progress Notes (Signed)
Inpatient Rehab Admissions Coordinator:   I met with  Patient at bedside  To discuss potential CIR admit. Pt. Expressed interest but did not appear to understand the specifics. I spoke with Pt.'s daughter, Lynelle Smoke, over the phone and she confirmed interest in CIR admit and confirmed that she and pt.'s son Marcello Moores can provide 24/7 support at discharge.   Clemens Catholic, [place], [place] Admissions Coordinator  [phone] ([place]) [phone] (office)

## 2020-10-08 NOTE — Progress Notes (Addendum)
NAME:  Fernando [name], MRN:  [id number], DOB:  August 03, 1927, LOS: 2 ADMISSION DATE:  10/06/2020, CONSULTATION DATE: 10/07/2020 REFERRING MD:  Dr. Leonel Ramsay, Neurology, CHIEF COMPLAINT:  Stroke   Brief History   86 yo male former smoker presented with Lt sided weakness, aphasia, and altered mental status.  Found to have large Rt MCA stenosis and had stenting.  Required intubation for procedure.  Noted to have fever 101.54F and found to have Streptococcus agalactiae bacteremia.  Past Medical History  A fib on xarelto, carotid artery disease s/p Lt CEA, CAD, Squamous cell carcinoma of parotic, tachy-brady syndrome s/p PM, HTN, HLD, Hypothyroidism  Significant Hospital Events   11/12 Admit, cerebral angiogram with angioplasty 11/13 extubated; neuro spoke with family >> DNR, continue other medical care  Consults:    Procedures:  ETT 11/12 >>11/13  Significant Diagnostic Tests:   CT head 11/12 >> hyperdense Rt MCA, chronic Lt cerebellar infarct, mild chronic encephalomalacia Rt occipital pole, chronic lacunar infarct Lt thalamus  CT angio head/neck 11/12 >> string sign of proximal Rt ICA, high grade proxomal Rt ICA stenosis, severe Rt P2 stenosis  Echo 11/13 >> EF 40 to 45%, severely dilated LA/RA, mild MR, mod TR, mild AR and AS  Micro Data:  COVID/Flu 11/12 >> negative Blood 11/12 >> Streptococcus agalactiae  MRSA PCR 11/13 >> negative Sputum 11/13 >>   Antimicrobials:  Zithromax 11/12 Cefepime 11/12 Flagyl 11/12  Vancomycin 11/12  Rocephin 11/12 >>   Interim history/subjective:  Wants water.  Denies chest pain.  Has some chest congestion.  Objective   Blood pressure (!) 149/78, pulse 90, temperature 99.1 F (37.3 C), temperature source Oral, resp. rate (!) 35, height 5\' 5"  (1.651 m), weight 64.9 kg, SpO2 95 %.        Intake/Output Summary (Last 24 hours) at 10/08/2020 0809 Last data filed at 10/08/2020 0725 Gross per 24 hour  Intake 2988.51 ml  Output 1835 ml    Net 1153.51 ml   Filed Weights   10/06/20 1300  Weight: 64.9 kg    Examination:  General - alert Eyes - pupils reactive ENT - no sinus tenderness, no stridor, edentulous Cardiac - irregular Chest - faint b/l crackles Abdomen - soft, non tender, + bowel sounds Extremities - no cyanosis, clubbing, or edema Skin - no rashes Neuro - moves extremities, follows commands Psych - normal mood and behavior   Resolved Hospital Problem list   Community acquired pneumonia ruled out, Lactic acidosis, Compromised airway in setting of CVA  Assessment & Plan:    Acute hypoxic respiratory failure from acute pulmonary edema and Rt pleural effusion. - goal SpO2 > 92% - lasix 40 mg IV x one on 11/14 - f/u CXR 11/15  Streptococcal agalactiae bacteremia. - day 3 of ABx  Acute CVA with Rt MCA stenosis s/p angioplasty. - continue ASA  New onset acute systolic CHF. Persistent A fib, HTN, HLD. - wean off levophed to keep MAP > 65 - continue lipitor once able to swallow pills - on xarelto as outpt; resume anticoagulation when okay with neurology - hold outpt lopressor for now  Hx of hypothyroidism. - resume synthroid when able to swallow pills  Hyperglycemia. - SSI  Hypokalemia. - f/u BMET  Anemia of critical illness. - f/u CBC - transfuse for Hb < 7 or significant bleeding  Best practice:  Diet: NPO, ice chips DVT prophylaxis: Lovenox GI prophylaxis: Not indicated Mobility: PT/OT >> recommending CIR Code Status: DNR Disposition: ICU  D/w Dr. Erlinda Hong  Labs    CMP Latest Ref Rng & Units 10/08/2020 10/07/2020 10/07/2020  Glucose 70 - 99 mg/dL 108(H) 110(H) -  BUN 8 - 23 mg/dL 34(H) 32(H) -  Creatinine 0.61 - 1.24 mg/dL 1.48(H) 1.23 -  Sodium 135 - 145 mmol/L 146(H) 140 145  Potassium 3.5 - 5.1 mmol/L 4.3 3.2(L) 3.7  Chloride 98 - 111 mmol/L 118(H) 114(H) -  CO2 22 - 32 mmol/L 16(L) 16(L) -  Calcium 8.9 - 10.3 mg/dL 7.9(L) 7.4(L) -  Total Protein 6.5 - 8.1 g/dL - - -   Total Bilirubin 0.3 - 1.2 mg/dL - - -  Alkaline Phos 38 - 126 U/L - - -  AST 15 - 41 U/L - - -  ALT 0 - 44 U/L - - -    CBC Latest Ref Rng & Units 10/08/2020 10/07/2020 10/07/2020  WBC 4.0 - 10.5 K/uL 17.7(H) 17.1(H) -  Hemoglobin 13.0 - 17.0 g/dL 8.2(L) 7.9(L) 8.8(L)  Hematocrit 39 - 52 % 27.5(L) 25.6(L) 26.0(L)  Platelets 150 - 400 K/uL 228 196 -    ABG    Component Value Date/Time   PHART 7.339 (L) 10/07/2020 0242   PCO2ART 33.4 10/07/2020 0242   PO2ART 280 (H) 10/07/2020 0242   HCO3 18.3 (L) 10/07/2020 0242   TCO2 19 (L) 10/07/2020 0242   ACIDBASEDEF 7.0 (H) 10/07/2020 0242   O2SAT 100.0 10/07/2020 0242    CBG (last 3)  Recent Labs    10/07/20 2322 10/08/20 0315 10/08/20 0719  GLUCAP 94 101* 102*    Signature:  Chesley Mires, MD [place] Pager - [phone] 10/08/2020, 8:09 AM

## 2020-10-08 NOTE — Progress Notes (Addendum)
ICD model is 2210 and is unsafe. Message sent to Dr. Erlinda Hong to make him aware we are unable to do scan.

## 2020-10-08 NOTE — Progress Notes (Signed)
Cardiology Consultation:   Patient ID: Fernando [name] MRN: [id number]; DOB: [birth year]  Admit date: 10/06/2020 Date of Consult: 10/08/2020  Primary Care Provider: Rosita Fire, MD [facility] Cardiologist: No primary care provider on file.  [facility] Electrophysiologist:   Lovena Le   Patient Profile:   Fernando [name] is a 86 y.o. male with a hx of pacemaker plantation, permanent atrial fibrillation, carotid stenosis who is being seen today for the evaluation of stroke and bacteremia at the request of Dr. Erlinda Hong.  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  He was driving as recently as the beginning of this year, but got a DUI driving home from his neighborhood bar.  Starting in August he had problems with mobility due to sudden severe weakness of both legs.  This improved after certain medication was discontinued and he has been doing well until last week.  He lives with his son who provides a large part of the history.  He has been carefully compliant with all his medications and in particular with his anticoagulant Xarelto.  He has not missed any doses and has not had any bleeding problems.  He has not had any recent dental procedures (he is completely edentulous and wears  dentures) or endoscopic procedures.  Last Wednesday, November 10 he was feeling weak.  The next day his son had to assist him to simply stand up.  He was complaining of generalized pain.  He developed shortness of breath and became probably confused.  As his condition kept deteriorating so he took him to the emergency room in [place].  Other than weakness in his legs he did not have focal neurological symptoms.  He was found to be febrile and tachycardic.  He was not hypotensive.  WBC was elevated at 17 K and his lactic acid was also elevated.  He was tentatively diagnosed as having pneumonia and started on antibiotics in the emergency room (vancomycin, cefepime and metronidazole).  While in the emergency room, he suddenly developed left hemiparesis.  Head CT showed a hyperdense right middle cerebral artery occlusion without evidence of infarct or hemorrhage.  He was transferred to [facility] and underwent successful emergency reperfusion with angioplasty via right femoral artery access.  Subsequent blood cultures have been positive for Streptococcus agalactiae.  He is currently receiving aspirin and prophylactic dose enoxaparin, but is not on intravenous heparin or other anticoagulants.  He is receiving ceftriaxone intravenously.  He remains borderline febrile (99.7 F), his WBC remains markedly elevated, lactate levels have normalized.  He is requiring a low-dose of intravenous norepinephrine for hypotension.  A follow-up MRI of the brain has been ordered, but delayed since his pacemaker generator is not MRI conditional Risk manager, leads are MRI conditional, [place] Jude [id number]).  He has moderate left-sided hemiparesis.  He has undergone an echocardiogram which appears to show a slight reduction in left ventricular systolic function.  Evaluation of  wall motion is difficult since he has both an underlying intraventricular conduction abnormality and intermittent ventricular pacing.  The study  was interpreted as showing a kinase is of the distal septum and apex.  There is no evidence of left ventricular thrombus with contrast.  In my opinion, overall the appearance is of global hypokinesis with pacing induced dyssynchrony, but cannot entirely exclude anteroseptal hypokinesis.  EF is around 45%.  He has prominent age-related degenerative changes of both aortic and mitral valve, without obvious vegetations.  There is mild aortic insufficiency, mild mitral insufficiency and moderate tricuspid insufficiency.  No clear evidence of any mobile echodensities on the pacemaker leads is seen.   Past Medical History:  Diagnosis Date   Arthritis    Atrial fibrillation ([place])    Carotid artery disease ([place])    Dr. 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 This mass was initially felt to be 1.7 cm at the time of presentation but had enlarged significantly to 4 cm clinically at the time of surgery. Pathologically it was 3.4 cm. A lymph node deep to the parotid was enlarged clinically at the time of surgery but was negative    Tachycardia-bradycardia syndrome [facility])     Past Surgical History:  Procedure Laterality Date   CAROTID ENDARTERECTOMY Left Jan. 3, 2009   CE   CATARACT EXTRACTION W/ INTRAOCULAR LENS IMPLANT     Bilateral   ESOPHAGOGASTRODUODENOSCOPY N/A 03/29/2018   Procedure: ESOPHAGOGASTRODUODENOSCOPY (EGD);  Surgeon: Danie Binder, MD;  Location[facility];  Service: Endoscopy;  Laterality: N/A;   EYE SURGERY     INSERT / REPLACE / REMOVE PACEMAKER     PACEMAKER INSERTION  10-01-2012   PARATHYROIDECTOMY  10/28/2012   PAROTIDECTOMY  10/28/2012   Procedure: PAROTIDECTOMY;  Surgeon: Ascencion Dike, MD;  Location: [place];  Service: ENT;   Laterality: Right;  Total Parotidectomy   PERMANENT PACEMAKER INSERTION N/A 10/01/2012   Procedure: PERMANENT PACEMAKER INSERTION;  Surgeon: Evans Lance, MD;  Location: [facility] CATH LAB;  Service: Cardiovascular;  Laterality: N/A;     Home Medications:  Prior to Admission medications   Medication Sig Start Date End Date Taking? Authorizing Provider  atorvastatin (LIPITOR) 40 MG tablet Take 40 mg by mouth every morning.     [provider]  levothyroxine (SYNTHROID) 50 MCG tablet Take 1 tablet (50 mcg total) by mouth daily before breakfast. 12/26/15   Penland, Kelby Fam, MD  metoprolol tartrate (LOPRESSOR) 25 MG tablet TAKE 1 TABLET TWICE A DAY Patient taking differently: Take 25 mg by mouth 2 (two) times daily.  12/02/13   Evans Lance, MD  ondansetron (ZOFRAN ODT) 4 MG disintegrating tablet 4mg  ODT q4 hours prn nausea/vomit 05/08/20   Milton Ferguson, MD  pantoprazole (PROTONIX) 40 MG tablet Take 1 tablet (40 mg total) by mouth 2 (two) times daily before a meal. Twice a day for 3 months and then start taking daily. 03/29/18   Barton Dubois, MD  XARELTO 15 MG TABS tablet TAKE 1 TABLET BY MOUTH ONCE DAILY WITH SUPPER Patient taking differently: Take 15 mg by mouth daily with supper.  04/25/20   Evans Lance, MD    Inpatient Medications: Scheduled Meds:  aspirin  300 mg Rectal Daily  Or   aspirin  325 mg Oral Daily   atorvastatin  80 mg Oral Daily   Chlorhexidine Gluconate Cloth  6 each Topical Daily   docusate sodium  100 mg Oral Daily   enoxaparin (LOVENOX) injection  30 mg Subcutaneous Q24H   insulin aspart  0-9 Units Subcutaneous Q4H   levothyroxine  50 mcg Oral Q0600   mouth rinse  15 mL Mouth Rinse BID   polyethylene glycol  17 g Oral Daily   Continuous Infusions:  sodium chloride 30 mL/hr at 10/08/20 0900   cefTRIAXone (ROCEPHIN)  IV Stopped (10/07/20 2342)   norepinephrine (LEVOPHED) Adult infusion 4 mcg/min (10/08/20 0900)   PRN Meds: acetaminophen  **OR** acetaminophen  Allergies:   No Known Allergies  Social History:   Social History   Socioeconomic History   Marital status: Widowed    Spouse name: Not on file   Number of children: Not on file   Years of education: Not on file   Highest education level: Not on file  Occupational History   Not on file  Tobacco Use   Smoking status: Former Smoker    Types: Cigarettes    Quit date: 11/25/1972    Years since quitting: 47.9   Smokeless tobacco: Never Used  Vaping Use   Vaping Use: Never used  Substance and Sexual Activity   Alcohol use: Yes    Alcohol/week: 1.0 standard drink    Types: 1 Cans of beer per week    Comment: daily   Drug use: No   Sexual activity: Never  Other Topics Concern   Not on file  Social History Narrative   Not on file   Social Determinants of Health   Financial Resource Strain:    Difficulty of Paying Living Expenses: Not on file  Food Insecurity:    Worried About [place] in the Last Year: Not on file   Ran Out of Food in the Last Year: Not on file  Transportation Needs:    Lack of Transportation (Medical): Not on file   Lack of Transportation (Non-Medical): Not on file  Physical Activity:    Days of Exercise per Week: Not on file   Minutes of Exercise per Session: Not on file  Stress:    Feeling of Stress : Not on file  Social Connections:    Frequency of Communication with Friends and Family: Not on file   Frequency of Social Gatherings with Friends and Family: Not on file   Attends Religious Services: Not on file   Active Member of Clubs or Organizations: Not on file   Attends Archivist Meetings: Not on file   Marital Status: Not on file  Intimate Partner Violence:    Fear of Current or Ex-Partner: Not on file   Emotionally Abused: Not on file   Physically Abused: Not on file   Sexually Abused: Not on file    Family History:    Family History  Problem Relation Age of Onset    Heart disease Sister    Heart attack Sister    Cirrhosis Mother    Cancer Brother        brain cancer   Cancer Sister        stomach cancer     ROS:  Please see the history of present illness.   All other ROS reviewed and negative.     Physical Exam/Data:   Vitals:   10/08/20 1015 10/08/20 1030 10/08/20 1045 10/08/20 1100  BP: (!) 110/54 (!) 124/52 119/63 111/76  Pulse: 66 64 64 87  Resp: (!) 24 (!) 21 (!) 26 (!) 36  Temp:      TempSrc:      SpO2: 97% 97% 96% (!) 84%  Weight:      Height:        Intake/Output Summary (Last 24 hours) at 10/08/2020 1318 Last data filed at 10/08/2020 0900 Gross per 24 hour  Intake 2748.03 ml  Output 835 ml  Net 1913.03 ml   Last 3 Weights 10/06/2020 07/02/2020 05/08/2020  Weight (lbs) 143 lb 143 lb 14.4 oz 130 lb  Weight (kg) 64.864 kg 65.273 kg 58.968 kg     Body mass index is 23.8 kg/m.  General:  Well nourished, well developed, in no acute distress.  Appears elderly and frail, but not acutely ill. HEENT: Edentulous, prominent left facial droop Lymph: no adenopathy Neck: no JVD Endocrine:  No thryomegaly Vascular: No carotid bruits; FA pulses 2+ bilaterally without bruits  Cardiac:  normal S1, S2; irregular; no diastolic murmurs.  1-2/6 aortic ejection murmur and a 9-4/1 holosystolic left lower sternal border murmur.  The pacemaker left subclavian site appears healthy without redness, swelling or tenderness Lungs:  clear to auscultation bilaterally, no wheezing, rhonchi or rales  Abd: soft, nontender, no hepatomegaly  Ext: no edema Musculoskeletal:  No deformities, BUE and BLE strength normal and equal Skin: warm and dry  Neuro: He has moderate dysarthria but does not have expressive aphasia as far as I can tell.  His words are difficult to understand but appears to be appropriate.  Still has left-sided weakness. Psych:  Normal affect   EKG:  The EKG was personally reviewed and demonstrates: Atrial fibrillation with  controlled ventricular response and occasional ventricular pacing Telemetry:  Telemetry was personally reviewed and demonstrates: Mostly native rhythm, atrial fibrillation with controlled ventricular rate, occasional ventricular paced beats  Relevant CV Studies: Echocardiogram 10/07/2020  1. Akinesis of the distal septum and apex; overall mildly reduced LV  function; no thrombus noted using definity.  2. Left ventricular ejection fraction, by estimation, is 40 to 45%. The  left ventricle has mildly decreased function. The left ventricle  demonstrates regional wall motion abnormalities (see scoring  diagram/findings for description). Left ventricular  diastolic function could not be evaluated.  3. Right ventricular systolic function is normal. The right ventricular  size is normal.  4. Left atrial size was severely dilated.  5. Right atrial size was severely dilated.  6. The mitral valve is normal in structure. Mild mitral valve  regurgitation. No evidence of mitral stenosis.  7. Tricuspid valve regurgitation is moderate.  8. The aortic valve is tricuspid. Aortic valve regurgitation is mild.  Mild aortic valve stenosis.  9. The inferior vena cava is normal in size with greater than 50%  respiratory variability, suggesting right atrial pressure of 3 mmHg.   Laboratory Data:  High Sensitivity Troponin:  No results for input(s): TROPONINIHS in the last 720 hours.   Chemistry Recent Labs  Lab 10/06/20 1042 10/06/20 1042 10/06/20 1738 10/06/20 1738 10/07/20 0242 10/07/20 0539 10/08/20 0225  NA 138   < > 142   < > 145 140 146*  K 3.8   < > 3.7   < > 3.7 3.2* 4.3  CL 102   < > 109  --   --  114* 118*  CO2 20*  --   --   --   --  16* 16*  GLUCOSE 172*   < >  145*  --   --  110* 108*  BUN 37*   < > 32*  --   --  32* 34*  CREATININE 1.49*   < > 1.30*  --   --  1.23 1.48*  CALCIUM 9.1  --   --   --   --  7.4* 7.9*  GFRNONAA 44*  --   --   --   --  55* 44*  ANIONGAP 16*  --    --   --   --  10 12   < > = values in this interval not displayed.    Recent Labs  Lab 10/06/20 1042  PROT 7.8  ALBUMIN 3.7  AST 29  ALT 18  ALKPHOS 62  BILITOT 1.0   Hematology Recent Labs  Lab 10/06/20 1042 10/06/20 1738 10/07/20 0242 10/07/20 0539 10/08/20 0225  WBC 17.1*  --   --  17.1* 17.7*  RBC 3.95*  --   --  3.42* 3.62*  HGB 9.1*   < > 8.8* 7.9* 8.2*  HCT 30.1*   < > 26.0* 25.6* 27.5*  MCV 76.2*  --   --  74.9* 76.0*  MCH 23.0*  --   --  23.1* 22.7*  MCHC 30.2  --   --  30.9 29.8*  RDW 18.2*  --   --  17.9* 18.4*  PLT 242  --   --  196 228   < > = values in this interval not displayed.   BNP Recent Labs  Lab 10/06/20 1042  BNP 1,097.0*    DDimer No results for input(s): DDIMER in the last 168 hours.   Radiology/Studies:  CT Code Stroke CTA Head W/WO contrast  Result Date: 10/06/2020 CLINICAL DATA:  Left-sided weakness. EXAM: CT ANGIOGRAPHY HEAD AND NECK CT PERFUSION BRAIN TECHNIQUE: Multidetector CT imaging of the head and neck was performed using the standard protocol during bolus administration of intravenous contrast. Multiplanar CT image reconstructions and MIPs were obtained to evaluate the vascular anatomy. Carotid stenosis measurements (when applicable) are obtained utilizing NASCET criteria, using the distal internal carotid diameter as the denominator. Multiphase CT imaging of the brain was performed following IV bolus contrast injection. Subsequent parametric perfusion maps were calculated using RAPID software. CONTRAST:  140mL OMNIPAQUE IOHEXOL 350 MG/ML SOLN COMPARISON:  Head and neck CTA 07/02/2020 FINDINGS: CTA NECK FINDINGS Aortic arch: Standard 3 vessel aortic arch with moderate calcified plaque. Similar size of a 3.4 cm outpouching from the transverse aorta in the region of the ductus although its inferior aspect was incompletely covered on today's study. This has a relatively narrow neck and may reflect a pseudoaneurysm. Unchanged kinked  appearance of the proximal to mid left subclavian artery. Limited assessment of a proximal to mid right subclavian artery stenosis due to artifact from motion and calcified plaque. Right carotid system: Patent with predominantly calcified plaque throughout the mid common carotid artery without significant associated stenosis. Extensive calcified and soft plaque at the carotid bifurcation with persistent radiographic string sign of the ICA at its origin. The more distal cervical ICA is patent but mildly small in caliber diffusely consistent with reduced flow. Left carotid system: Patent with suspected moderate stenosis of the common carotid artery origin though with motion artifact limiting precise characterization. Mild calcified plaque in the distal common and proximal internal carotid arteries without significant stenosis. Vertebral arteries: Patent with the right being strongly dominant and the left being diffusely small in caliber. No evidence of dissection or significant stenosis  on the right. Limited assessment of the proximal left V1 segment due to motion artifact. Skeleton: Asymmetrically advanced left-sided cervical facet arthrosis with mild multilevel degenerative anterolisthesis. Other neck: Right parotidectomy. Absent or atrophic right submandibular gland. No evidence of cervical lymphadenopathy. Upper chest: Motion artifact in the lung apices. Partially visualized patchy ground-glass opacities bilaterally with a moderate-sized pleural effusion on the right. Review of the MIP images confirms the above findings CTA HEAD FINDINGS Anterior circulation: The internal carotid arteries are patent from skull base to carotid termini with relatively diffuse atherosclerotic plaque resulting in at least mild multifocal stenoses bilaterally with assessment limited by motion artifact today. The MCAs are patent without evidence of a proximal branch occlusion, however there is mild-to-moderate diffuse attenuation of  right MCA branch vessels compared to the left which may reflect reduced flow from the high-grade proximal ICA stenosis. The left A1 segment is again noted to be absent or severely hypoplastic. The right A1 segment is widely patent. The right A2 segment appears dominant with chronic poor visualization of the left ACA. No aneurysm is identified. Posterior circulation: The intracranial vertebral arteries are patent to the basilar. Patent left PICA, right AICA, and bilateral SCA origins are identified. The basilar artery is patent with an unchanged mild stenosis proximally. There is a fetal origin of the right PCA. Both PCAs are patent and diffusely irregular, and there is a severe proximal to mid P2 stenosis on the right. No aneurysm is identified. Venous sinuses: Patent. Anatomic variants: Fetal right PCA.  Diminutive or absent left A1. Review of the MIP images confirms the above findings CT Brain Perfusion Findings: ASPECTS: 10 CBF (<30%) Volume: 0 mL Perfusion (Tmax>6.0s) volume: 312 mL Mismatch Volume: 312 mL Infarction Location: No core infarct identified. Prolonged Tmax throughout essentially the entirety of the right cerebral hemisphere as well as portions of the left ACA territory. IMPRESSION: 1. No emergent large vessel occlusion. 2. Persistent radiographic string sign of the proximal right ICA. 3. Delayed transit throughout the entire right cerebral hemisphere and left ACA territory related to the high-grade proximal right ICA stenosis and chronically diminutive/diseased left ACA. No core infarct identified by CTP. 4. Severe right P2 stenosis. 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 6. Bilateral ground-glass lung opacities which could reflect edema or infection. Moderate right pleural effusion. 7.  Aortic Atherosclerosis (ICD10-I70.0). These results were called by telephone at the time of interpretation on 10/06/2020 at 6:15 pm to Dr. Roland Rack , who verbally acknowledged these results.  Electronically Signed   By: Logan Bores M.D.   On: 10/06/2020 18:54   CT HEAD WO CONTRAST  Result Date: 10/07/2020 CLINICAL DATA:  Stenotic disease of the carotid arteries right worse than left. Assess for potential infarction on the right. EXAM: CT HEAD WITHOUT CONTRAST TECHNIQUE: Contiguous axial images were obtained from the base of the skull through the vertex without intravenous contrast. COMPARISON:  10/06/2020 FINDINGS: Brain: Chronic appearing small vessel ischemic changes of the pons and midbrain. Old cerebellar infarctions on the left. Cerebral hemispheres show an old lacunar infarction in the left thalamus and right caudate head and chronic small-vessel ischemic changes of the cerebral hemispheric white matter. No sign of acute infarction, mass lesion, hemorrhage, hydrocephalus or extra-axial collection. Vascular: There is atherosclerotic calcification of the major vessels at the base of the brain. Skull: Negative Sinuses/Orbits: Mucosal inflammatory changes of the paranasal sinuses. Orbits negative. Other: None IMPRESSION: No acute finding by CT. Chronic small-vessel ischemic changes throughout the brain as outlined above.  Electronically Signed   By: Nelson Chimes M.D.   On: 10/07/2020 06:15   CT Code Stroke CTA Neck W/WO contrast  Result Date: 10/06/2020 CLINICAL DATA:  Left-sided weakness. EXAM: CT ANGIOGRAPHY HEAD AND NECK CT PERFUSION BRAIN TECHNIQUE: Multidetector CT imaging of the head and neck was performed using the standard protocol during bolus administration of intravenous contrast. Multiplanar CT image reconstructions and MIPs were obtained to evaluate the vascular anatomy. Carotid stenosis measurements (when applicable) are obtained utilizing NASCET criteria, using the distal internal carotid diameter as the denominator. Multiphase CT imaging of the brain was performed following IV bolus contrast injection. Subsequent parametric perfusion maps were calculated using RAPID software.  CONTRAST:  176mL OMNIPAQUE IOHEXOL 350 MG/ML SOLN COMPARISON:  Head and neck CTA 07/02/2020 FINDINGS: CTA NECK FINDINGS Aortic arch: Standard 3 vessel aortic arch with moderate calcified plaque. Similar size of a 3.4 cm outpouching from the transverse aorta in the region of the ductus although its inferior aspect was incompletely covered on today's study. This has a relatively narrow neck and may reflect a pseudoaneurysm. Unchanged kinked appearance of the proximal to mid left subclavian artery. Limited assessment of a proximal to mid right subclavian artery stenosis due to artifact from motion and calcified plaque. Right carotid system: Patent with predominantly calcified plaque throughout the mid common carotid artery without significant associated stenosis. Extensive calcified and soft plaque at the carotid bifurcation with persistent radiographic string sign of the ICA at its origin. The more distal cervical ICA is patent but mildly small in caliber diffusely consistent with reduced flow. Left carotid system: Patent with suspected moderate stenosis of the common carotid artery origin though with motion artifact limiting precise characterization. Mild calcified plaque in the distal common and proximal internal carotid arteries without significant stenosis. Vertebral arteries: Patent with the right being strongly dominant and the left being diffusely small in caliber. No evidence of dissection or significant stenosis on the right. Limited assessment of the proximal left V1 segment due to motion artifact. Skeleton: Asymmetrically advanced left-sided cervical facet arthrosis with mild multilevel degenerative anterolisthesis. Other neck: Right parotidectomy. Absent or atrophic right submandibular gland. No evidence of cervical lymphadenopathy. Upper chest: Motion artifact in the lung apices. Partially visualized patchy ground-glass opacities bilaterally with a moderate-sized pleural effusion on the right. Review of  the MIP images confirms the above findings CTA HEAD FINDINGS Anterior circulation: The internal carotid arteries are patent from skull base to carotid termini with relatively diffuse atherosclerotic plaque resulting in at least mild multifocal stenoses bilaterally with assessment limited by motion artifact today. The MCAs are patent without evidence of a proximal branch occlusion, however there is mild-to-moderate diffuse attenuation of right MCA branch vessels compared to the left which may reflect reduced flow from the high-grade proximal ICA stenosis. The left A1 segment is again noted to be absent or severely hypoplastic. The right A1 segment is widely patent. The right A2 segment appears dominant with chronic poor visualization of the left ACA. No aneurysm is identified. Posterior circulation: The intracranial vertebral arteries are patent to the basilar. Patent left PICA, right AICA, and bilateral SCA origins are identified. The basilar artery is patent with an unchanged mild stenosis proximally. There is a fetal origin of the right PCA. Both PCAs are patent and diffusely irregular, and there is a severe proximal to mid P2 stenosis on the right. No aneurysm is identified. Venous sinuses: Patent. Anatomic variants: Fetal right PCA.  Diminutive or absent left A1. Review of the MIP  images confirms the above findings CT Brain Perfusion Findings: ASPECTS: 10 CBF (<30%) Volume: 0 mL Perfusion (Tmax>6.0s) volume: 312 mL Mismatch Volume: 312 mL Infarction Location: No core infarct identified. Prolonged Tmax throughout essentially the entirety of the right cerebral hemisphere as well as portions of the left ACA territory. IMPRESSION: 1. No emergent large vessel occlusion. 2. Persistent radiographic string sign of the proximal right ICA. 3. Delayed transit throughout the entire right cerebral hemisphere and left ACA territory related to the high-grade proximal right ICA stenosis and chronically diminutive/diseased left  ACA. No core infarct identified by CTP. 4. Severe right P2 stenosis. 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 6. Bilateral ground-glass lung opacities which could reflect edema or infection. Moderate right pleural effusion. 7.  Aortic Atherosclerosis (ICD10-I70.0). These results were called by telephone at the time of interpretation on 10/06/2020 at 6:15 pm to Dr. Roland Rack , who verbally acknowledged these results. Electronically Signed   By: Logan Bores M.D.   On: 10/06/2020 18:54   CT Code Stroke Cerebral Perfusion with contrast  Result Date: 10/06/2020 CLINICAL DATA:  Left-sided weakness. EXAM: CT ANGIOGRAPHY HEAD AND NECK CT PERFUSION BRAIN TECHNIQUE: Multidetector CT imaging of the head and neck was performed using the standard protocol during bolus administration of intravenous contrast. Multiplanar CT image reconstructions and MIPs were obtained to evaluate the vascular anatomy. Carotid stenosis measurements (when applicable) are obtained utilizing NASCET criteria, using the distal internal carotid diameter as the denominator. Multiphase CT imaging of the brain was performed following IV bolus contrast injection. Subsequent parametric perfusion maps were calculated using RAPID software. CONTRAST:  154mL OMNIPAQUE IOHEXOL 350 MG/ML SOLN COMPARISON:  Head and neck CTA 07/02/2020 FINDINGS: CTA NECK FINDINGS Aortic arch: Standard 3 vessel aortic arch with moderate calcified plaque. Similar size of a 3.4 cm outpouching from the transverse aorta in the region of the ductus although its inferior aspect was incompletely covered on today's study. This has a relatively narrow neck and may reflect a pseudoaneurysm. Unchanged kinked appearance of the proximal to mid left subclavian artery. Limited assessment of a proximal to mid right subclavian artery stenosis due to artifact from motion and calcified plaque. Right carotid system: Patent with predominantly calcified plaque throughout the mid common  carotid artery without significant associated stenosis. Extensive calcified and soft plaque at the carotid bifurcation with persistent radiographic string sign of the ICA at its origin. The more distal cervical ICA is patent but mildly small in caliber diffusely consistent with reduced flow. Left carotid system: Patent with suspected moderate stenosis of the common carotid artery origin though with motion artifact limiting precise characterization. Mild calcified plaque in the distal common and proximal internal carotid arteries without significant stenosis. Vertebral arteries: Patent with the right being strongly dominant and the left being diffusely small in caliber. No evidence of dissection or significant stenosis on the right. Limited assessment of the proximal left V1 segment due to motion artifact. Skeleton: Asymmetrically advanced left-sided cervical facet arthrosis with mild multilevel degenerative anterolisthesis. Other neck: Right parotidectomy. Absent or atrophic right submandibular gland. No evidence of cervical lymphadenopathy. Upper chest: Motion artifact in the lung apices. Partially visualized patchy ground-glass opacities bilaterally with a moderate-sized pleural effusion on the right. Review of the MIP images confirms the above findings CTA HEAD FINDINGS Anterior circulation: The internal carotid arteries are patent from skull base to carotid termini with relatively diffuse atherosclerotic plaque resulting in at least mild multifocal stenoses bilaterally with assessment limited by motion artifact today. The MCAs  are patent without evidence of a proximal branch occlusion, however there is mild-to-moderate diffuse attenuation of right MCA branch vessels compared to the left which may reflect reduced flow from the high-grade proximal ICA stenosis. The left A1 segment is again noted to be absent or severely hypoplastic. The right A1 segment is widely patent. The right A2 segment appears dominant with  chronic poor visualization of the left ACA. No aneurysm is identified. Posterior circulation: The intracranial vertebral arteries are patent to the basilar. Patent left PICA, right AICA, and bilateral SCA origins are identified. The basilar artery is patent with an unchanged mild stenosis proximally. There is a fetal origin of the right PCA. Both PCAs are patent and diffusely irregular, and there is a severe proximal to mid P2 stenosis on the right. No aneurysm is identified. Venous sinuses: Patent. Anatomic variants: Fetal right PCA.  Diminutive or absent left A1. Review of the MIP images confirms the above findings CT Brain Perfusion Findings: ASPECTS: 10 CBF (<30%) Volume: 0 mL Perfusion (Tmax>6.0s) volume: 312 mL Mismatch Volume: 312 mL Infarction Location: No core infarct identified. Prolonged Tmax throughout essentially the entirety of the right cerebral hemisphere as well as portions of the left ACA territory. IMPRESSION: 1. No emergent large vessel occlusion. 2. Persistent radiographic string sign of the proximal right ICA. 3. Delayed transit throughout the entire right cerebral hemisphere and left ACA territory related to the high-grade proximal right ICA stenosis and chronically diminutive/diseased left ACA. No core infarct identified by CTP. 4. Severe right P2 stenosis. 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 6. Bilateral ground-glass lung opacities which could reflect edema or infection. Moderate right pleural effusion. 7.  Aortic Atherosclerosis (ICD10-I70.0). These results were called by telephone at the time of interpretation on 10/06/2020 at 6:15 pm to Dr. Roland Rack , who verbally acknowledged these results. Electronically Signed   By: Logan Bores M.D.   On: 10/06/2020 18:54   DG Chest Port 1 View  Result Date: 10/08/2020 CLINICAL DATA:  Shortness of breath EXAM: PORTABLE CHEST 1 VIEW COMPARISON:  October 06, 2020 FINDINGS: There is cardiomegaly with pulmonary venous hypertension.  Pacemaker leads are attached to the right atrium and right ventricle. There are small pleural effusions bilaterally with mild interstitial edema. No consolidation. There is aortic atherosclerosis. No adenopathy. No bone lesions. IMPRESSION: Cardiomegaly with pulmonary vascular congestion. Small pleural effusions bilaterally with mild interstitial edema. Suspect a degree of underlying congestive heart failure. Pacemaker leads attached to right atrium and right ventricle. Aortic Atherosclerosis (ICD10-I70.0). Electronically Signed   By: Lowella Grip III M.D.   On: 10/08/2020 09:57   DG Chest Port 1 View  Result Date: 10/06/2020 CLINICAL DATA:  Shortness of breath, back pain since this morning EXAM: PORTABLE CHEST 1 VIEW COMPARISON:  Portable exam 1207 hours compared to 10/02/2012 FINDINGS: LEFT subclavian sequential transvenous pacemaker leads project at RIGHT atrium and RIGHT ventricle. Enlargement of cardiac silhouette. Mediastinal contours and pulmonary vascularity normal. Atherosclerotic calcification aorta. Slight accentuation of interstitial markings in the mid to lower lungs increased from previous exam, question minimal pulmonary edema or developing chronic interstitial lung disease. No pleural effusion or pneumothorax. Bones demineralized with BILATERAL glenohumeral degenerative changes. IMPRESSION: Enlargement of cardiac silhouette with pulmonary vascular congestion. Increased interstitial markings in the mid to lower lungs question minimal edema or developing chronic interstitial lung disease. Electronically Signed   By: Lavonia Dana M.D.   On: 10/06/2020 12:23   ECHOCARDIOGRAM COMPLETE  Result Date: 10/07/2020    ECHOCARDIOGRAM REPORT  Patient Name:   Fernando [name] Date of Exam: 10/07/2020 Medical Rec #:  [id number]      Height:       65.0 in Accession #:    [id number]     Weight:       143.0 lb Date of Birth:  [birth year]     BSA:          1.715 m Patient Age:    27 years       BP:            119/82 mmHg Patient Gender: M              HR:           111 bpm. Exam Location:  Inpatient Procedure: 2D Echo, Intracardiac Opacification Agent, Color Doppler and Cardiac            Doppler Indications:    Stroke  History:        Patient has prior history of Echocardiogram examinations, most                 recent 07/03/2020. CAD, Pacemaker, Arrythmias:Atrial Fibrillation;                 Risk Factors:Dyslipidemia, Hypertension and Former Smoker.                 Tachy-Brady syndrome.  Sonographer:    Clayton Lefort RDCS (AE) Referring Phys: [id number] [place]  1. Akinesis of the distal septum and apex; overall mildly reduced LV function; no thrombus noted using definity.  2. Left ventricular ejection fraction, by estimation, is 40 to 45%. The left ventricle has mildly decreased function. The left ventricle demonstrates regional wall motion abnormalities (see scoring diagram/findings for description). Left ventricular diastolic function could not be evaluated.  3. Right ventricular systolic function is normal. The right ventricular size is normal.  4. Left atrial size was severely dilated.  5. Right atrial size was severely dilated.  6. The mitral valve is normal in structure. Mild mitral valve regurgitation. No evidence of mitral stenosis.  7. Tricuspid valve regurgitation is moderate.  8. The aortic valve is tricuspid. Aortic valve regurgitation is mild. Mild aortic valve stenosis.  9. The inferior vena cava is normal in size with greater than 50% respiratory variability, suggesting right atrial pressure of 3 mmHg. FINDINGS  Left Ventricle: Left ventricular ejection fraction, by estimation, is 40 to 45%. The left ventricle has mildly decreased function. The left ventricle demonstrates regional wall motion abnormalities. Definity contrast agent was given IV to delineate the left ventricular endocardial borders. The left ventricular internal cavity size was normal in size. There is no left ventricular  hypertrophy. Left ventricular diastolic function could not be evaluated due to atrial fibrillation. Left ventricular diastolic function could not be evaluated. Right Ventricle: The right ventricular size is normal. Right ventricular systolic function is normal. Left Atrium: Left atrial size was severely dilated. Right Atrium: Right atrial size was severely dilated. Pericardium: Trivial pericardial effusion is present. Mitral Valve: The mitral valve is normal in structure. Mild mitral annular calcification. Mild mitral valve regurgitation. No evidence of mitral valve stenosis. Tricuspid Valve: The tricuspid valve is normal in structure. Tricuspid valve regurgitation is moderate . No evidence of tricuspid stenosis. Aortic Valve: The aortic valve is tricuspid. Aortic valve regurgitation is mild. Aortic regurgitation PHT measures 292 msec. Mild aortic stenosis is present. Aortic valve mean gradient measures 10.5 mmHg. Aortic valve peak gradient measures 21.6 mmHg. Aortic valve  area, by VTI measures 1.46 cm. Pulmonic Valve: The pulmonic valve was not well visualized. Pulmonic valve regurgitation is not visualized. No evidence of pulmonic stenosis. Aorta: The aortic root is normal in size and structure. Venous: The inferior vena cava is normal in size with greater than 50% respiratory variability, suggesting right atrial pressure of 3 mmHg. IAS/Shunts: No atrial level shunt detected by color flow Doppler. Additional Comments: Akinesis of the distal septum and apex; overall mildly reduced LV function; no thrombus noted using definity. A pacer wire is visualized.  LEFT VENTRICLE PLAX 2D LVOT diam:     2.00 cm LV SV:         49 LV SV Index:   29 LVOT Area:     3.14 cm  RIGHT VENTRICLE            IVC RV Basal diam:  3.30 cm    IVC diam: 2.10 cm RV S prime:     9.46 cm/s TAPSE (M-mode): 1.3 cm LEFT ATRIUM            Index       RIGHT ATRIUM           Index LA Vol (A2C): 81.4 ml  47.46 ml/m RA Area:     31.90 cm LA Vol  (A4C): 102.0 ml 59.47 ml/m RA Volume:   109.00 ml 63.55 ml/m  AORTIC VALVE AV Area (Vmax):    1.42 cm AV Area (Vmean):   1.28 cm AV Area (VTI):     1.46 cm AV Vmax:           232.50 cm/s AV Vmean:          147.500 cm/s AV VTI:            0.335 m AV Peak Grad:      21.6 mmHg AV Mean Grad:      10.5 mmHg LVOT Vmax:         105.00 cm/s LVOT Vmean:        60.100 cm/s LVOT VTI:          0.156 m LVOT/AV VTI ratio: 0.47 AI PHT:            292 msec  AORTA Ao Root diam: 3.50 cm Ao Asc diam:  3.00 cm TRICUSPID VALVE TR Peak grad:   57.8 mmHg TR Vmax:        380.00 cm/s  SHUNTS Systemic VTI:  0.16 m Systemic Diam: 2.00 cm Kirk Ruths MD Electronically signed by Kirk Ruths MD Signature Date/Time: 10/07/2020/4:08:38 PM    Final    CT HEAD CODE STROKE WO CONTRAST  Result Date: 10/06/2020 CLINICAL DATA:  Code stroke. 86 year old male with new onset left weakness. EXAM: CT HEAD WITHOUT CONTRAST TECHNIQUE: Contiguous axial images were obtained from the base of the skull through the vertex without intravenous contrast. COMPARISON:  CT head 07/02/2020. FINDINGS: Brain: No midline shift, mass effect, or evidence of intracranial mass lesion. No acute intracranial hemorrhage identified. No ventriculomegaly. Stable gray-white matter differentiation throughout the brain. No acute cortically based infarct identified. Chronic left cerebellar infarct. Mild chronic encephalomalacia in the right occipital pole. Chronic lacunar infarct left thalamus. Vascular: Extensive Calcified atherosclerosis at the skull base. New hyperdense right MCA (series 4, image 28). Skull: No acute osseous abnormality identified. Sinuses/Orbits: Stable paranasal sinuses and mastoids, chronic right mastoid effusion. Other: Rightward gaze deviation is new. Stable scalp. ASPECTS [facility] Stroke Program Early CT Score) Total score (0-10 with 10 being normal): 10 IMPRESSION:  1. Hyperdense Right MCA suspicious for Emergent Large Vessel Occlusion. 2. No  acute cortically based infarct or acute intracranial hemorrhage identified. ASPECTS 10. 3. Stable CT appearance of brain parenchyma since August. 4. Study discussed by telephone with Dr. Vonna Kotyk LONG on 10/06/2020 at 17:37. He advised that clinically the patient does not have fix right gaze, and that the presentation is more suspicious of sepsis than large vessel occlusion. Electronically Signed   By: Genevie Ann M.D.   On: 10/06/2020 17:40     Assessment and Plan:   1. Sepsis: The constellation of low-grade fever, elevated white blood cell count, elevated lactic acid level, hypotension, tachycardia and streptococcal bacteremia all suggest sepsis, which is improving.  He is still on low-dose norepinephrine.  Seems to be improving with antibiotics.  Additional blood cultures should be drawn to make sure that the bacteremia is not persistent. 2. Streptococcus agalactiae bacteremia: Although this is an unusual organism for endocarditis, it is known to be very aggressive causing rapid destruction of valvular tissue and has high mortality.  The transthoracic ultrasound cannot exclude the presence of vegetations on his valves or the pacemaker leads.  Ideally he should have evaluation with transesophageal echocardiography, but this is not urgent.  He is not a candidate for surgery (either early or delayed).  We will follow his clinical evolution and need to make sure we are carefully establishing goals of care in this 86 year old before we perform more invasive procedures.  In the short-term the treatment remains with antibiotics, but it would be important to establish whether he has endocarditis before we resume anticoagulants.  Would recommend a repeat transthoracic echocardiogram later this week. 3. Right MCA stroke: He has known severe internal carotid stenosis on the right side, has atrial fibrillation and may have endocarditis.  Any one of these disorders may be the actual cause of his stroke.  Improving following  reperfusion. 4. Permanent atrial fibrillation: The patient and his son are both confident that he has not missed any doses of Xarelto.  This makes the likelihood of cardioembolic stroke from atrial fibrillation, less likely, but not impossible.  His arrhythmia is well rate controlled.  ChadsVasc 7. 5. History of tachycardia-bradycardia syndrome: He is now in permanent atrial fibrillation and his pacemaker is programmed VVI.  He is not pacemaker dependent. 6. Pacemaker: Pacemaker lead endocarditis is a possibility and should be explored with TEE if clinically appropriate in this elderly gentleman.  However, I doubt that he would be a candidate for lead extraction, even if infection is identified.  His leads are MRI conditional, but the [employer] generator is not officially MRI compatible.  Even so, if MRI scanning is critical to his care, cautious scanning can be performed with appropriate supervision and reprogramming of the device. 7. LV systolic dysfunction: He is clearly at risk of having CAD and ischemic cardiomyopathy.  This is new compared to his previous echocardiogram and may be related to ischemia in the territory of the LAD artery.  There are no signs of acute ischemia on his ECG and his cardiac enzymes are unremarkable.  He never complained of chest discomfort.  Coronary work-up is not appropriate at this time due to his other health problems.  He does not have overt findings of congestive heart failure on physical exam, but his chest x-ray does show some vascular congestion.  His weight today is identical to his weight last August.  Avoid excessive administration of IV fluids.  Monitor in/out carefully.  No  diuretics indicated at this time.       CHA2DS2-VASc Score = 7  This indicates a 11.2% annual risk of stroke. The patient's score is based upon: CHF History: 1 HTN History: 1 Diabetes History: 0 Stroke History: 2 Vascular Disease History: 1 Age Score: 2 Gender Score: 0        CRITICAL CARE Performed by: Dani Gobble Mariselda Badalamenti   Total critical care time: 50 minutes  Critical care time was exclusive of separately billable procedures and treating other patients.  Critical care was necessary to treat or prevent imminent or life-threatening deterioration.  Critical care was time spent personally by me on the following activities: development of treatment plan with patient and/or surrogate as well as nursing, discussions with consultants, evaluation of patient's response to treatment, examination of patient, obtaining history from patient or surrogate, ordering and performing treatments and interventions, ordering and review of laboratory studies, ordering and review of radiographic studies, pulse oximetry and re-evaluation of patient's condition.   For questions or updates, please contact [place] Please consult [email or web address] for contact info under    Signed, Sanda Klein, MD  10/08/2020 1:18 PM

## 2020-10-08 NOTE — Consult Note (Signed)
Physical Medicine and Rehabilitation Consult Reason for Consult: Left side weakness with severe dysarthria Referring Physician: Dr.Xu  HPI: Fernando [name] is a 86 y.o. right-handed male with history of atrial fibrillation maintained on Xarelto, hypertension, CAD/pacemaker, hyperlipidemia, prior left CEA, SCC of the carotid gland and recent TIA August 2021.  He quit smoking 47 years ago.  Per chart review patient lives with his children.  He was receiving assist for ADLs and home care.  1 level home 6 steps to entry.  Presented 10/07/2020 to [facility] left side weakness as well as dysarthria.  Cranial CT scan showed hyperdense right MCA suspicious for emergent large vessel occlusion.  No acute cortically based infarct or acute intracranial hemorrhage.  Patient did not receive TPA.  Echocardiogram with ejection fraction of 40 to 45%.  Without thrombus.  CTA of head and neck no emergent large vessel occlusion.  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  Delayed transit throughout the entire right cerebral hemisphere and left ACA territory related to high-grade proximal right ICA stenosis and chronically diminutive disease left ACA.  Patient underwent ICA angioplasty per interventional radiology.  He did require intubation for a short time.  Hospital course fever blood cultures 2/2 G+ cocci and currently maintained on Rocephin.  He was cleared to begin aspirin for CVA prophylaxis for right MCA infarct due to right ICA high-grade stenosis.  Lovenox was added for DVT prophylaxis.  Patient is currently NPO.  Therapy evaluations completed with recommendations of physical medicine rehab consult.  Review of Systems  Constitutional: Positive for fever. Negative for chills.  HENT: Negative for hearing loss.   Eyes: Negative for blurred vision and double vision.  Respiratory: Negative for cough and shortness of breath.   Cardiovascular: Positive for palpitations and leg swelling.  Gastrointestinal:  Positive for constipation. Negative for heartburn, nausea and vomiting.  Genitourinary: Negative for dysuria, flank pain and hematuria.  Musculoskeletal: Positive for joint pain and myalgias.  Skin: Negative for rash.  Neurological: Positive for speech change and weakness.  All other systems reviewed and are negative.  Past Medical History:  Diagnosis Date  . Arthritis   . Atrial fibrillation ([place])   . Carotid artery disease ([place])    Dr. Kellie Simmering - s/p left CEA  . Coronary artery disease   . Essential hypertension, benign   . Hyperlipidemia   . 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 This mass was initially felt to be 1.7 cm at the time of presentation but had enlarged significantly to 4 cm clinically at the time of surgery. Pathologically it was 3.4 cm. A lymph node deep to the parotid was enlarged clinically at the time of surgery but was negative   . 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 CAROTID ENDARTERECTOMY Left Jan. 3, 2009   CE  . CATARACT EXTRACTION W/ INTRAOCULAR LENS IMPLANT     Bilateral  . ESOPHAGOGASTRODUODENOSCOPY N/A 03/29/2018   Procedure: ESOPHAGOGASTRODUODENOSCOPY (EGD);  Surgeon: Danie Binder, MD;  Location[facility];  Service: Endoscopy;  Laterality: N/A;  . EYE SURGERY    . INSERT / REPLACE / REMOVE PACEMAKER    . PACEMAKER INSERTION  10-01-2012  . PARATHYROIDECTOMY  10/28/2012  . PAROTIDECTOMY  10/28/2012   Procedure: PAROTIDECTOMY;  Surgeon: Ascencion Dike, MD;  Location: [place];  Service: ENT;  Laterality: Right;  Total Parotidectomy  . PERMANENT PACEMAKER INSERTION N/A 10/01/2012   Procedure: PERMANENT PACEMAKER INSERTION;  Surgeon: Evans Lance, MD;  Location: [facility] CATH LAB;  Service: Cardiovascular;  Laterality: N/A;   Family History  Problem Relation Age of  Onset  . Heart disease Sister   . Heart attack Sister   . Cirrhosis Mother   . Cancer Brother        brain cancer  . Cancer Sister        stomach cancer   Social History:  reports that he quit smoking about 47 years ago. His smoking use included cigarettes. He has never used smokeless tobacco. He reports current alcohol use of about 1.0 standard drink of alcohol per week. He reports that he does not use drugs. Allergies: No Known Allergies Medications Prior to Admission  Medication Sig Dispense Refill  . atorvastatin (LIPITOR) 40 MG tablet Take 40 mg by mouth every morning.     Marland Kitchen levothyroxine (SYNTHROID) 50 MCG tablet Take 1 tablet (50 mcg total) by mouth daily before breakfast. 30 tablet 3  . metoprolol tartrate (LOPRESSOR) 25 MG tablet TAKE 1 TABLET TWICE A DAY (Patient taking differently: Take 25 mg by mouth 2 (two) times daily. ) 180 tablet 0  . ondansetron (ZOFRAN ODT) 4 MG disintegrating tablet 4mg  ODT q4 hours prn nausea/vomit 12 tablet 0  . pantoprazole (PROTONIX) 40 MG tablet Take 1 tablet (40 mg total) by mouth 2 (two) times daily before a meal. Twice a day for 3 months and then start taking daily. 60 tablet 3  . XARELTO 15 MG TABS tablet TAKE 1 TABLET BY MOUTH ONCE DAILY WITH SUPPER (Patient taking differently: Take 15 mg by mouth daily with supper. ) 90 tablet 3    Home: Home Living Family/patient expects to be discharged to:: Private residence Living Arrangements: Children Available Help at Discharge: Family Type of Home: House Home Access: Stairs to enter Technical brewer of Steps: 5 Entrance Stairs-Rails: Right, Left Home Layout: One level Bathroom Shower/Tub: Chiropodist: Handicapped height Bathroom Accessibility: Yes Home Equipment: Environmental consultant - 2 wheels, [place] - single point, Bedside commode, Shower seat - built in Additional Comments: information from previous charting, pt unable to state clear answers to history questions or consistently  answer yes/no. No family present at eval  Functional History: Prior Function Level of Independence: Needs assistance Comments: Per neurology note, pt received assist for ADLs and home care such as cooking, but pt did not need assist for mobility. Pt gave inconsistent answers during history in session Functional Status:  Mobility: Bed Mobility Overal bed mobility: Needs Assistance Bed Mobility: Supine to Sit Supine to sit: Mod assist, +2 for physical assistance General bed mobility comments: modA to BLE and to raise trunk from [facility]. Use of bed pads to control hips from sliding off of EOB Transfers Overall transfer level: Needs assistance Equipment used: 2 person hand held assist Transfers: Sit to/from Stand, Stand Pivot Transfers Sit to Stand: Max assist, +2 physical assistance, +2 safety/equipment Stand pivot transfers: Max assist, +2 physical assistance, +2 safety/equipment General transfer comment: maxA to stand with HHA of 2, pt with strong L lean continued in standing. MaxA to initiate steps to pivot to recliner. Required facilitation at hips to initiate safe descent for sitting Ambulation/Gait Ambulation/Gait assistance: Max assist, +2 physical assistance Gait Distance (Feet): 3 Feet Assistive device: 2 person hand held assist Gait Pattern/deviations: Step-to pattern, Decreased stride length, Narrow base of support General Gait Details:  pt heavily reliant on support or therapists, narrow BOS with poor ability to maintain balance or clear feet for steps. assist to wt shift for stepping    ADL: ADL Overall ADL's : Needs assistance/impaired Eating/Feeding: NPO Grooming: Minimal assistance, Moderate assistance, Sitting Upper Body Bathing: Moderate assistance, Sitting Lower Body Bathing: Maximal assistance, Sitting/lateral leans, Sit to/from stand Upper Body Dressing : Moderate assistance, Sitting Lower Body Dressing: Maximal assistance, Sitting/lateral leans, Sit to/from  stand Toilet Transfer: Maximal assistance, +2 for physical assistance, +2 for safety/equipment, Stand-pivot, BSC Toilet Transfer Details (indicate cue type and reason): assist to rise and steady with increased L sided assist due to weakness Toileting- Clothing Manipulation and Hygiene: Total assistance Functional mobility during ADLs: Maximal assistance, +2 for physical assistance, +2 for safety/equipment, Cueing for safety, Cueing for sequencing (stand pivot)  Cognition: Cognition Overall Cognitive Status: Difficult to assess Orientation Level: Oriented to person, Disoriented to place, Disoriented to time, Disoriented to situation Cognition Arousal/Alertness: Awake/alert Behavior During Therapy: WFL for tasks assessed/performed Overall Cognitive Status: Difficult to assess Area of Impairment: Following commands, Orientation, Safety/judgement, Problem solving Orientation Level: Time (able to state he is in [place] and they think he might have had a stroke) Following Commands: Follows one step commands with increased time (repetition) Safety/Judgement: Decreased awareness of safety, Decreased awareness of deficits Problem Solving: Slow processing, Requires verbal cues, Requires tactile cues General Comments: difficult to fully assess due to pt being [facility] and with low tone garbled speech. Requires increased time and repetition to follow basic mobility commands Difficult to assess due to: Impaired communication  Blood pressure (!) 136/58, pulse 87, temperature 98.5 F (36.9 C), temperature source Axillary, resp. rate (!) 36, height 5\' 5"  (1.651 m), weight 64.9 kg, SpO2 95 %. Physical Exam General: Alert, No apparent distress HEENT: left facial droop, thrush Neck: Supple without JVD or lymphadenopathy Heart: Irregularly irregular. No murmurs rubs or gallops Chest: Tachypneic Abdomen: Soft, non-tender, non-distended, bowel sounds positive. Extremities: No clubbing, cyanosis, or edema. Pulses are  2+ Skin: Clean and intact without signs of breakdown Neuro: Patient is alert.  Severely dysarthric.  Makes eye contact with examiner.  Very limited to follow commands.  +dysarthria RUE: 4/5 throughout LUE: 3/5 throughout RLE: 3/5 throughout LLE: 3/5 throughout Psych: Pt's affect is appropriate. Pt is cooperative  Results for orders placed or performed during the hospital encounter of 10/06/20 (from the past 24 hour(s))  Glucose, capillary     Status: None   Collection Time: 10/07/20 11:13 AM  Result Value Ref Range   Glucose-Capillary 83 70 - 99 mg/dL  Glucose, capillary     Status: None   Collection Time: 10/07/20  4:13 PM  Result Value Ref Range   Glucose-Capillary 76 70 - 99 mg/dL  Glucose, capillary     Status: None   Collection Time: 10/07/20  7:33 PM  Result Value Ref Range   Glucose-Capillary 82 70 - 99 mg/dL  Glucose, capillary     Status: None   Collection Time: 10/07/20 11:22 PM  Result Value Ref Range   Glucose-Capillary 94 70 - 99 mg/dL  CBC     Status: Abnormal   Collection Time: 10/08/20  2:25 AM  Result Value Ref Range   WBC 17.7 (H) 4.0 - 10.5 K/uL   RBC 3.62 (L) 4.22 - 5.81 MIL/uL   Hemoglobin 8.2 (L) 13.0 - 17.0 g/dL   HCT 27.5 (L) 39 - 52 %   MCV 76.0 (L) 80.0 - 100.0 fL   MCH 22.7 (  L) 26.0 - 34.0 pg   MCHC 29.8 (L) 30.0 - 36.0 g/dL   RDW 18.4 (H) 11.5 - 15.5 %   Platelets 228 150 - 400 K/uL   nRBC 0.3 (H) 0.0 - 0.2 %  Basic metabolic panel     Status: Abnormal   Collection Time: 10/08/20  2:25 AM  Result Value Ref Range   Sodium 146 (H) 135 - 145 mmol/L   Potassium 4.3 3.5 - 5.1 mmol/L   Chloride 118 (H) 98 - 111 mmol/L   CO2 16 (L) 22 - 32 mmol/L   Glucose, Bld 108 (H) 70 - 99 mg/dL   BUN 34 (H) 8 - 23 mg/dL   Creatinine, Ser 1.48 (H) 0.61 - 1.24 mg/dL   Calcium 7.9 (L) 8.9 - 10.3 mg/dL   GFR, Estimated 44 (L) >60 mL/min   Anion gap 12 5 - 15  Magnesium     Status: None   Collection Time: 10/08/20  2:25 AM  Result Value Ref Range    Magnesium 1.9 1.7 - 2.4 mg/dL  Glucose, capillary     Status: Abnormal   Collection Time: 10/08/20  3:15 AM  Result Value Ref Range   Glucose-Capillary 101 (H) 70 - 99 mg/dL  Glucose, capillary     Status: Abnormal   Collection Time: 10/08/20  7:19 AM  Result Value Ref Range   Glucose-Capillary 102 (H) 70 - 99 mg/dL   CT Code Stroke CTA Head W/WO contrast  Result Date: 10/06/2020 CLINICAL DATA:  Left-sided weakness. EXAM: CT ANGIOGRAPHY HEAD AND NECK CT PERFUSION BRAIN TECHNIQUE: Multidetector CT imaging of the head and neck was performed using the standard protocol during bolus administration of intravenous contrast. Multiplanar CT image reconstructions and MIPs were obtained to evaluate the vascular anatomy. Carotid stenosis measurements (when applicable) are obtained utilizing NASCET criteria, using the distal internal carotid diameter as the denominator. Multiphase CT imaging of the brain was performed following IV bolus contrast injection. Subsequent parametric perfusion maps were calculated using RAPID software. CONTRAST:  153mL OMNIPAQUE IOHEXOL 350 MG/ML SOLN COMPARISON:  Head and neck CTA 07/02/2020 FINDINGS: CTA NECK FINDINGS Aortic arch: Standard 3 vessel aortic arch with moderate calcified plaque. Similar size of a 3.4 cm outpouching from the transverse aorta in the region of the ductus although its inferior aspect was incompletely covered on today's study. This has a relatively narrow neck and may reflect a pseudoaneurysm. Unchanged kinked appearance of the proximal to mid left subclavian artery. Limited assessment of a proximal to mid right subclavian artery stenosis due to artifact from motion and calcified plaque. Right carotid system: Patent with predominantly calcified plaque throughout the mid common carotid artery without significant associated stenosis. Extensive calcified and soft plaque at the carotid bifurcation with persistent radiographic string sign of the ICA at its origin.  The more distal cervical ICA is patent but mildly small in caliber diffusely consistent with reduced flow. Left carotid system: Patent with suspected moderate stenosis of the common carotid artery origin though with motion artifact limiting precise characterization. Mild calcified plaque in the distal common and proximal internal carotid arteries without significant stenosis. Vertebral arteries: Patent with the right being strongly dominant and the left being diffusely small in caliber. No evidence of dissection or significant stenosis on the right. Limited assessment of the proximal left V1 segment due to motion artifact. Skeleton: Asymmetrically advanced left-sided cervical facet arthrosis with mild multilevel degenerative anterolisthesis. Other neck: Right parotidectomy. Absent or atrophic right submandibular gland. No evidence of  cervical lymphadenopathy. Upper chest: Motion artifact in the lung apices. Partially visualized patchy ground-glass opacities bilaterally with a moderate-sized pleural effusion on the right. Review of the MIP images confirms the above findings CTA HEAD FINDINGS Anterior circulation: The internal carotid arteries are patent from skull base to carotid termini with relatively diffuse atherosclerotic plaque resulting in at least mild multifocal stenoses bilaterally with assessment limited by motion artifact today. The MCAs are patent without evidence of a proximal branch occlusion, however there is mild-to-moderate diffuse attenuation of right MCA branch vessels compared to the left which may reflect reduced flow from the high-grade proximal ICA stenosis. The left A1 segment is again noted to be absent or severely hypoplastic. The right A1 segment is widely patent. The right A2 segment appears dominant with chronic poor visualization of the left ACA. No aneurysm is identified. Posterior circulation: The intracranial vertebral arteries are patent to the basilar. Patent left PICA, right AICA,  and bilateral SCA origins are identified. The basilar artery is patent with an unchanged mild stenosis proximally. There is a fetal origin of the right PCA. Both PCAs are patent and diffusely irregular, and there is a severe proximal to mid P2 stenosis on the right. No aneurysm is identified. Venous sinuses: Patent. Anatomic variants: Fetal right PCA.  Diminutive or absent left A1. Review of the MIP images confirms the above findings CT Brain Perfusion Findings: ASPECTS: 10 CBF (<30%) Volume: 0 mL Perfusion (Tmax>6.0s) volume: 312 mL Mismatch Volume: 312 mL Infarction Location: No core infarct identified. Prolonged Tmax throughout essentially the entirety of the right cerebral hemisphere as well as portions of the left ACA territory. IMPRESSION: 1. No emergent large vessel occlusion. 2. Persistent radiographic string sign of the proximal right ICA. 3. Delayed transit throughout the entire right cerebral hemisphere and left ACA territory related to the high-grade proximal right ICA stenosis and chronically diminutive/diseased left ACA. No core infarct identified by CTP. 4. Severe right P2 stenosis. 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 6. Bilateral ground-glass lung opacities which could reflect edema or infection. Moderate right pleural effusion. 7.  Aortic Atherosclerosis (ICD10-I70.0). These results were called by telephone at the time of interpretation on 10/06/2020 at 6:15 pm to Dr. Roland Rack , who verbally acknowledged these results. Electronically Signed   By: Logan Bores M.D.   On: 10/06/2020 18:54   CT HEAD WO CONTRAST  Result Date: 10/07/2020 CLINICAL DATA:  Stenotic disease of the carotid arteries right worse than left. Assess for potential infarction on the right. EXAM: CT HEAD WITHOUT CONTRAST TECHNIQUE: Contiguous axial images were obtained from the base of the skull through the vertex without intravenous contrast. COMPARISON:  10/06/2020 FINDINGS: Brain: Chronic appearing small  vessel ischemic changes of the pons and midbrain. Old cerebellar infarctions on the left. Cerebral hemispheres show an old lacunar infarction in the left thalamus and right caudate head and chronic small-vessel ischemic changes of the cerebral hemispheric white matter. No sign of acute infarction, mass lesion, hemorrhage, hydrocephalus or extra-axial collection. Vascular: There is atherosclerotic calcification of the major vessels at the base of the brain. Skull: Negative Sinuses/Orbits: Mucosal inflammatory changes of the paranasal sinuses. Orbits negative. Other: None IMPRESSION: No acute finding by CT. Chronic small-vessel ischemic changes throughout the brain as outlined above. Electronically Signed   By: Nelson Chimes M.D.   On: 10/07/2020 06:15   CT Code Stroke CTA Neck W/WO contrast  Result Date: 10/06/2020 CLINICAL DATA:  Left-sided weakness. EXAM: CT ANGIOGRAPHY HEAD AND NECK CT PERFUSION  BRAIN TECHNIQUE: Multidetector CT imaging of the head and neck was performed using the standard protocol during bolus administration of intravenous contrast. Multiplanar CT image reconstructions and MIPs were obtained to evaluate the vascular anatomy. Carotid stenosis measurements (when applicable) are obtained utilizing NASCET criteria, using the distal internal carotid diameter as the denominator. Multiphase CT imaging of the brain was performed following IV bolus contrast injection. Subsequent parametric perfusion maps were calculated using RAPID software. CONTRAST:  115mL OMNIPAQUE IOHEXOL 350 MG/ML SOLN COMPARISON:  Head and neck CTA 07/02/2020 FINDINGS: CTA NECK FINDINGS Aortic arch: Standard 3 vessel aortic arch with moderate calcified plaque. Similar size of a 3.4 cm outpouching from the transverse aorta in the region of the ductus although its inferior aspect was incompletely covered on today's study. This has a relatively narrow neck and may reflect a pseudoaneurysm. Unchanged kinked appearance of the  proximal to mid left subclavian artery. Limited assessment of a proximal to mid right subclavian artery stenosis due to artifact from motion and calcified plaque. Right carotid system: Patent with predominantly calcified plaque throughout the mid common carotid artery without significant associated stenosis. Extensive calcified and soft plaque at the carotid bifurcation with persistent radiographic string sign of the ICA at its origin. The more distal cervical ICA is patent but mildly small in caliber diffusely consistent with reduced flow. Left carotid system: Patent with suspected moderate stenosis of the common carotid artery origin though with motion artifact limiting precise characterization. Mild calcified plaque in the distal common and proximal internal carotid arteries without significant stenosis. Vertebral arteries: Patent with the right being strongly dominant and the left being diffusely small in caliber. No evidence of dissection or significant stenosis on the right. Limited assessment of the proximal left V1 segment due to motion artifact. Skeleton: Asymmetrically advanced left-sided cervical facet arthrosis with mild multilevel degenerative anterolisthesis. Other neck: Right parotidectomy. Absent or atrophic right submandibular gland. No evidence of cervical lymphadenopathy. Upper chest: Motion artifact in the lung apices. Partially visualized patchy ground-glass opacities bilaterally with a moderate-sized pleural effusion on the right. Review of the MIP images confirms the above findings CTA HEAD FINDINGS Anterior circulation: The internal carotid arteries are patent from skull base to carotid termini with relatively diffuse atherosclerotic plaque resulting in at least mild multifocal stenoses bilaterally with assessment limited by motion artifact today. The MCAs are patent without evidence of a proximal branch occlusion, however there is mild-to-moderate diffuse attenuation of right MCA branch  vessels compared to the left which may reflect reduced flow from the high-grade proximal ICA stenosis. The left A1 segment is again noted to be absent or severely hypoplastic. The right A1 segment is widely patent. The right A2 segment appears dominant with chronic poor visualization of the left ACA. No aneurysm is identified. Posterior circulation: The intracranial vertebral arteries are patent to the basilar. Patent left PICA, right AICA, and bilateral SCA origins are identified. The basilar artery is patent with an unchanged mild stenosis proximally. There is a fetal origin of the right PCA. Both PCAs are patent and diffusely irregular, and there is a severe proximal to mid P2 stenosis on the right. No aneurysm is identified. Venous sinuses: Patent. Anatomic variants: Fetal right PCA.  Diminutive or absent left A1. Review of the MIP images confirms the above findings CT Brain Perfusion Findings: ASPECTS: 10 CBF (<30%) Volume: 0 mL Perfusion (Tmax>6.0s) volume: 312 mL Mismatch Volume: 312 mL Infarction Location: No core infarct identified. Prolonged Tmax throughout essentially the entirety of the right  cerebral hemisphere as well as portions of the left ACA territory. IMPRESSION: 1. No emergent large vessel occlusion. 2. Persistent radiographic string sign of the proximal right ICA. 3. Delayed transit throughout the entire right cerebral hemisphere and left ACA territory related to the high-grade proximal right ICA stenosis and chronically diminutive/diseased left ACA. No core infarct identified by CTP. 4. Severe right P2 stenosis. 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 6. Bilateral ground-glass lung opacities which could reflect edema or infection. Moderate right pleural effusion. 7.  Aortic Atherosclerosis (ICD10-I70.0). These results were called by telephone at the time of interpretation on 10/06/2020 at 6:15 pm to Dr. Roland Rack , who verbally acknowledged these results. Electronically Signed    By: Logan Bores M.D.   On: 10/06/2020 18:54   CT Code Stroke Cerebral Perfusion with contrast  Result Date: 10/06/2020 CLINICAL DATA:  Left-sided weakness. EXAM: CT ANGIOGRAPHY HEAD AND NECK CT PERFUSION BRAIN TECHNIQUE: Multidetector CT imaging of the head and neck was performed using the standard protocol during bolus administration of intravenous contrast. Multiplanar CT image reconstructions and MIPs were obtained to evaluate the vascular anatomy. Carotid stenosis measurements (when applicable) are obtained utilizing NASCET criteria, using the distal internal carotid diameter as the denominator. Multiphase CT imaging of the brain was performed following IV bolus contrast injection. Subsequent parametric perfusion maps were calculated using RAPID software. CONTRAST:  137mL OMNIPAQUE IOHEXOL 350 MG/ML SOLN COMPARISON:  Head and neck CTA 07/02/2020 FINDINGS: CTA NECK FINDINGS Aortic arch: Standard 3 vessel aortic arch with moderate calcified plaque. Similar size of a 3.4 cm outpouching from the transverse aorta in the region of the ductus although its inferior aspect was incompletely covered on today's study. This has a relatively narrow neck and may reflect a pseudoaneurysm. Unchanged kinked appearance of the proximal to mid left subclavian artery. Limited assessment of a proximal to mid right subclavian artery stenosis due to artifact from motion and calcified plaque. Right carotid system: Patent with predominantly calcified plaque throughout the mid common carotid artery without significant associated stenosis. Extensive calcified and soft plaque at the carotid bifurcation with persistent radiographic string sign of the ICA at its origin. The more distal cervical ICA is patent but mildly small in caliber diffusely consistent with reduced flow. Left carotid system: Patent with suspected moderate stenosis of the common carotid artery origin though with motion artifact limiting precise characterization. Mild  calcified plaque in the distal common and proximal internal carotid arteries without significant stenosis. Vertebral arteries: Patent with the right being strongly dominant and the left being diffusely small in caliber. No evidence of dissection or significant stenosis on the right. Limited assessment of the proximal left V1 segment due to motion artifact. Skeleton: Asymmetrically advanced left-sided cervical facet arthrosis with mild multilevel degenerative anterolisthesis. Other neck: Right parotidectomy. Absent or atrophic right submandibular gland. No evidence of cervical lymphadenopathy. Upper chest: Motion artifact in the lung apices. Partially visualized patchy ground-glass opacities bilaterally with a moderate-sized pleural effusion on the right. Review of the MIP images confirms the above findings CTA HEAD FINDINGS Anterior circulation: The internal carotid arteries are patent from skull base to carotid termini with relatively diffuse atherosclerotic plaque resulting in at least mild multifocal stenoses bilaterally with assessment limited by motion artifact today. The MCAs are patent without evidence of a proximal branch occlusion, however there is mild-to-moderate diffuse attenuation of right MCA branch vessels compared to the left which may reflect reduced flow from the high-grade proximal ICA stenosis. The left A1 segment is  again noted to be absent or severely hypoplastic. The right A1 segment is widely patent. The right A2 segment appears dominant with chronic poor visualization of the left ACA. No aneurysm is identified. Posterior circulation: The intracranial vertebral arteries are patent to the basilar. Patent left PICA, right AICA, and bilateral SCA origins are identified. The basilar artery is patent with an unchanged mild stenosis proximally. There is a fetal origin of the right PCA. Both PCAs are patent and diffusely irregular, and there is a severe proximal to mid P2 stenosis on the right. No  aneurysm is identified. Venous sinuses: Patent. Anatomic variants: Fetal right PCA.  Diminutive or absent left A1. Review of the MIP images confirms the above findings CT Brain Perfusion Findings: ASPECTS: 10 CBF (<30%) Volume: 0 mL Perfusion (Tmax>6.0s) volume: 312 mL Mismatch Volume: 312 mL Infarction Location: No core infarct identified. Prolonged Tmax throughout essentially the entirety of the right cerebral hemisphere as well as portions of the left ACA territory. IMPRESSION: 1. No emergent large vessel occlusion. 2. Persistent radiographic string sign of the proximal right ICA. 3. Delayed transit throughout the entire right cerebral hemisphere and left ACA territory related to the high-grade proximal right ICA stenosis and chronically diminutive/diseased left ACA. No core infarct identified by CTP. 4. Severe right P2 stenosis. 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 6. Bilateral ground-glass lung opacities which could reflect edema or infection. Moderate right pleural effusion. 7.  Aortic Atherosclerosis (ICD10-I70.0). These results were called by telephone at the time of interpretation on 10/06/2020 at 6:15 pm to Dr. Roland Rack , who verbally acknowledged these results. Electronically Signed   By: Logan Bores M.D.   On: 10/06/2020 18:54   DG Chest Port 1 View  Result Date: 10/06/2020 CLINICAL DATA:  Shortness of breath, back pain since this morning EXAM: PORTABLE CHEST 1 VIEW COMPARISON:  Portable exam 1207 hours compared to 10/02/2012 FINDINGS: LEFT subclavian sequential transvenous pacemaker leads project at RIGHT atrium and RIGHT ventricle. Enlargement of cardiac silhouette. Mediastinal contours and pulmonary vascularity normal. Atherosclerotic calcification aorta. Slight accentuation of interstitial markings in the mid to lower lungs increased from previous exam, question minimal pulmonary edema or developing chronic interstitial lung disease. No pleural effusion or pneumothorax. Bones  demineralized with BILATERAL glenohumeral degenerative changes. IMPRESSION: Enlargement of cardiac silhouette with pulmonary vascular congestion. Increased interstitial markings in the mid to lower lungs question minimal edema or developing chronic interstitial lung disease. Electronically Signed   By: Lavonia Dana M.D.   On: 10/06/2020 12:23   ECHOCARDIOGRAM COMPLETE  Result Date: 10/07/2020    ECHOCARDIOGRAM REPORT   Patient Name:   ZACKARY MCKEONE Date of Exam: 10/07/2020 Medical Rec #:  [id number]      Height:       65.0 in Accession #:    [id number]     Weight:       143.0 lb Date of Birth:  05/12/1927     BSA:          1.715 m Patient Age:    31 years       BP:           119/82 mmHg Patient Gender: M              HR:           111 bpm. Exam Location:  Inpatient Procedure: 2D Echo, Intracardiac Opacification Agent, Color Doppler and Cardiac            Doppler Indications:  Stroke  History:        Patient has prior history of Echocardiogram examinations, most                 recent 07/03/2020. CAD, Pacemaker, Arrythmias:Atrial Fibrillation;                 Risk Factors:Dyslipidemia, Hypertension and Former Smoker.                 Tachy-Brady syndrome.  Sonographer:    Clayton Lefort RDCS (AE) Referring Phys: [id number] [place]  1. Akinesis of the distal septum and apex; overall mildly reduced LV function; no thrombus noted using definity.  2. Left ventricular ejection fraction, by estimation, is 40 to 45%. The left ventricle has mildly decreased function. The left ventricle demonstrates regional wall motion abnormalities (see scoring diagram/findings for description). Left ventricular diastolic function could not be evaluated.  3. Right ventricular systolic function is normal. The right ventricular size is normal.  4. Left atrial size was severely dilated.  5. Right atrial size was severely dilated.  6. The mitral valve is normal in structure. Mild mitral valve regurgitation. No evidence of mitral  stenosis.  7. Tricuspid valve regurgitation is moderate.  8. The aortic valve is tricuspid. Aortic valve regurgitation is mild. Mild aortic valve stenosis.  9. The inferior vena cava is normal in size with greater than 50% respiratory variability, suggesting right atrial pressure of 3 mmHg. FINDINGS  Left Ventricle: Left ventricular ejection fraction, by estimation, is 40 to 45%. The left ventricle has mildly decreased function. The left ventricle demonstrates regional wall motion abnormalities. Definity contrast agent was given IV to delineate the left ventricular endocardial borders. The left ventricular internal cavity size was normal in size. There is no left ventricular hypertrophy. Left ventricular diastolic function could not be evaluated due to atrial fibrillation. Left ventricular diastolic function could not be evaluated. Right Ventricle: The right ventricular size is normal. Right ventricular systolic function is normal. Left Atrium: Left atrial size was severely dilated. Right Atrium: Right atrial size was severely dilated. Pericardium: Trivial pericardial effusion is present. Mitral Valve: The mitral valve is normal in structure. Mild mitral annular calcification. Mild mitral valve regurgitation. No evidence of mitral valve stenosis. Tricuspid Valve: The tricuspid valve is normal in structure. Tricuspid valve regurgitation is moderate . No evidence of tricuspid stenosis. Aortic Valve: The aortic valve is tricuspid. Aortic valve regurgitation is mild. Aortic regurgitation PHT measures 292 msec. Mild aortic stenosis is present. Aortic valve mean gradient measures 10.5 mmHg. Aortic valve peak gradient measures 21.6 mmHg. Aortic valve area, by VTI measures 1.46 cm. Pulmonic Valve: The pulmonic valve was not well visualized. Pulmonic valve regurgitation is not visualized. No evidence of pulmonic stenosis. Aorta: The aortic root is normal in size and structure. Venous: The inferior vena cava is normal in  size with greater than 50% respiratory variability, suggesting right atrial pressure of 3 mmHg. IAS/Shunts: No atrial level shunt detected by color flow Doppler. Additional Comments: Akinesis of the distal septum and apex; overall mildly reduced LV function; no thrombus noted using definity. A pacer wire is visualized.  LEFT VENTRICLE PLAX 2D LVOT diam:     2.00 cm LV SV:         49 LV SV Index:   29 LVOT Area:     3.14 cm  RIGHT VENTRICLE            IVC RV Basal diam:  3.30 cm  IVC diam: 2.10 cm RV S prime:     9.46 cm/s TAPSE (M-mode): 1.3 cm LEFT ATRIUM            Index       RIGHT ATRIUM           Index LA Vol (A2C): 81.4 ml  47.46 ml/m RA Area:     31.90 cm LA Vol (A4C): 102.0 ml 59.47 ml/m RA Volume:   109.00 ml 63.55 ml/m  AORTIC VALVE AV Area (Vmax):    1.42 cm AV Area (Vmean):   1.28 cm AV Area (VTI):     1.46 cm AV Vmax:           232.50 cm/s AV Vmean:          147.500 cm/s AV VTI:            0.335 m AV Peak Grad:      21.6 mmHg AV Mean Grad:      10.5 mmHg LVOT Vmax:         105.00 cm/s LVOT Vmean:        60.100 cm/s LVOT VTI:          0.156 m LVOT/AV VTI ratio: 0.47 AI PHT:            292 msec  AORTA Ao Root diam: 3.50 cm Ao Asc diam:  3.00 cm TRICUSPID VALVE TR Peak grad:   57.8 mmHg TR Vmax:        380.00 cm/s  SHUNTS Systemic VTI:  0.16 m Systemic Diam: 2.00 cm Kirk Ruths MD Electronically signed by Kirk Ruths MD Signature Date/Time: 10/07/2020/4:08:38 PM    Final    CT HEAD CODE STROKE WO CONTRAST  Result Date: 10/06/2020 CLINICAL DATA:  Code stroke. 86 year old male with new onset left weakness. EXAM: CT HEAD WITHOUT CONTRAST TECHNIQUE: Contiguous axial images were obtained from the base of the skull through the vertex without intravenous contrast. COMPARISON:  CT head 07/02/2020. FINDINGS: Brain: No midline shift, mass effect, or evidence of intracranial mass lesion. No acute intracranial hemorrhage identified. No ventriculomegaly. Stable gray-white matter differentiation  throughout the brain. No acute cortically based infarct identified. Chronic left cerebellar infarct. Mild chronic encephalomalacia in the right occipital pole. Chronic lacunar infarct left thalamus. Vascular: Extensive Calcified atherosclerosis at the skull base. New hyperdense right MCA (series 4, image 28). Skull: No acute osseous abnormality identified. Sinuses/Orbits: Stable paranasal sinuses and mastoids, chronic right mastoid effusion. Other: Rightward gaze deviation is new. Stable scalp. ASPECTS [facility] Stroke Program Early CT Score) Total score (0-10 with 10 being normal): 10 IMPRESSION: 1. Hyperdense Right MCA suspicious for Emergent Large Vessel Occlusion. 2. No acute cortically based infarct or acute intracranial hemorrhage identified. ASPECTS 10. 3. Stable CT appearance of brain parenchyma since August. 4. Study discussed by telephone with Dr. Vonna Kotyk LONG on 10/06/2020 at 17:37. He advised that clinically the patient does not have fix right gaze, and that the presentation is more suspicious of sepsis than large vessel occlusion. Electronically Signed   By: Genevie Ann M.D.   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 Does the need for close, 24 hr/day medical supervision in concert with the patient's rehab needs make it unreasonable for this patient to be served in a less intensive setting? Yes 2. Co-Morbidities requiring supervision/potential complications: carotid stenosis, chronic afib, tachy-brady syndrome s/p pacer, cardiomyopathy, pulmonary edema 3. Due to bladder management, bowel management,  safety, skin/wound care, disease management, medication administration, pain management and patient education, does the patient require 24 hr/day rehab nursing? Yes 4. Does the patient require coordinated care of a physician, rehab nurse, therapy disciplines of PT, OT, SLP to address physical and functional deficits in the context of the  above medical diagnosis(es)? Yes Addressing deficits in the following areas: balance, endurance, locomotion, strength, transferring, bowel/bladder control, bathing, dressing, feeding, grooming, toileting, cognition and speech 5. Can the patient actively participate in an intensive therapy program of at least 3 hrs of therapy per day at least 5 days per week? Yes 6. The potential for patient to make measurable gains while on inpatient rehab is excellent 7. Anticipated functional outcomes upon discharge from inpatient rehab are modified independent  with PT, modified independent with OT, modified independent with SLP. 8. Estimated rehab length of stay to reach the above functional goals is: 15-19 days 9. Anticipated discharge destination: Home 10. Overall Rehab/Functional Prognosis: excellent  RECOMMENDATIONS: This patient's condition is appropriate for continued rehabilitative care in the following setting: CIR Patient has agreed to participate in recommended program. Yes Note that insurance prior authorization may be required for reimbursement for recommended care.  Comment: Thank you for this consult. Admission coordinator to follow.   I have personally performed a face to face diagnostic evaluation, including, but not limited to relevant history and physical exam findings, of this patient and developed relevant assessment and plan.  Additionally, I have reviewed and concur with the physician assistant's documentation above.  Leeroy Cha, MD  Lavon Paganini [place], PA-C 10/08/2020

## 2020-10-08 NOTE — Evaluation (Signed)
Speech Language Pathology Evaluation Patient Details Name: Fernando [name] MRN: [id number] DOB: 02-21-1927 Today's Date: 10/08/2020 Time: 0354-6568 SLP Time Calculation (min) (ACUTE ONLY): 15 min  Problem List:  Patient Active Problem List   Diagnosis Date Noted  . Sepsis with acute organ dysfunction without septic shock ([place])   . Systolic CHF ([place])   . Stroke (cerebrum) ([place]) 10/06/2020  . Hemiparesis of left dominant side ([place]) 07/02/2020  . Normocytic anemia 07/02/2020  . Hypothyroidism 07/02/2020  . GI bleed 03/27/2018  . CAD (coronary artery disease), native coronary artery 03/27/2018  . Acute blood loss anemia 03/27/2018  . HLD (hyperlipidemia) 03/27/2018  . Depression 03/27/2018  . Esophageal dysphagia   . History of radiation therapy   . Symptomatic anemia   . Weakness   . Carotid stenosis 09/27/2014  . Aftercare following surgery of the circulatory system 09/27/2014  . 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 Pacemaker-St.Jude 10/02/2012  . Atrial fibrillation ([place]) 09/09/2012  . Essential hypertension, benign 09/09/2012  . Tachy-brady syndrome ([place]) 09/09/2012  . Occlusion and stenosis of carotid artery without mention of cerebral infarction 03/19/2012   Past Medical History:  Past Medical History:  Diagnosis Date  . Arthritis   . Atrial fibrillation ([place])   . Carotid artery disease ([place])    Dr. Kellie Simmering - s/p left CEA  . Coronary artery disease   . Essential hypertension, benign   . Hyperlipidemia   . 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 This mass was initially felt to be 1.7 cm at the time of presentation but had enlarged significantly to 4 cm clinically at the time of surgery. Pathologically it was 3.4 cm. A lymph node deep to the parotid was enlarged clinically at the time of surgery but was negative    . Tachycardia-bradycardia syndrome [facility])    Past Surgical History:  Past Surgical History:  Procedure Laterality Date  . CAROTID ENDARTERECTOMY Left Jan. 3, 2009   CE  . CATARACT EXTRACTION W/ INTRAOCULAR LENS IMPLANT     Bilateral  . ESOPHAGOGASTRODUODENOSCOPY N/A 03/29/2018   Procedure: ESOPHAGOGASTRODUODENOSCOPY (EGD);  Surgeon: Danie Binder, MD;  Location: [facility];  Service: Endoscopy;  Laterality: N/A;  . EYE SURGERY    . INSERT / REPLACE / REMOVE PACEMAKER    . PACEMAKER INSERTION  10-01-2012  . PARATHYROIDECTOMY  10/28/2012  . PAROTIDECTOMY  10/28/2012   Procedure: PAROTIDECTOMY;  Surgeon: Ascencion Dike, MD;  Location: [facility];  Service: ENT;  Laterality: Right;  Total Parotidectomy  . PERMANENT PACEMAKER INSERTION N/A 10/01/2012   Procedure: PERMANENT PACEMAKER INSERTION;  Surgeon: Evans Lance, MD;  Location: [facility] CATH LAB;  Service: Cardiovascular;  Laterality: N/A;   HPI:  The pt is a 86 yo male presenting with L sided weakness and AMS. Upon work-up, found to have large R MCA stenosis and is now s/p stenting. Pt intubated for procedure 11/12 and extubated 11/13. PMH includes: a fib on Xarelto, CAD, HTN, HLD, pacemaker 10/2012.   Assessment / Plan / Recommendation Clinical Impression  Pt was seen for a cognitive-linguistic evaluation.  Evaluation was limited secondary to fatigue following BSE.  Pt's son was present and provided baseline information about the patient.  Son reported that he lives with the pt and that he handles most of his IALDs including medication and financial management; however, he stated that the pt did not  appear to have any cognitive deficits at baseline prior to this admission.  Pt was oriented x4 to self, year, place, and city given additional time for response.  Pt presents with dysarthria c/b reduced intelligibility at the word, phrase, and sentence level.  He was approximately 45% intelligible to an unfamiliar listener.  Of note, his dentures were not  in place, which may have negatively impacted his speech intelligibility to a small degree.  Pt exhibited some difficulty with confrontational naming tasks, but he answered yes/no questions and additional case history questions appropriately.  Short-term memory and complex safety judgement appeared to be at least mildly impaired.  Recommend additional diagnostic treatment when the pt is more alert.  Additionally recommend CIR and assistance with IALDs at time of discharge.  SLP will f/u acutely.      SLP Assessment  SLP Visit Diagnosis: Cognitive communication deficit (R41.841)    Follow Up Recommendations  Inpatient Rehab    Frequency and Duration min 2x/week  2 weeks      SLP Evaluation Cognition  Overall Cognitive Status: Difficult to assess Arousal/Alertness: Lethargic Orientation Level: Oriented X4 Safety/Judgment: Impaired       Comprehension  Auditory Comprehension Overall Auditory Comprehension: Appears within functional limits for tasks assessed Yes/No Questions: Within Functional Limits Reading Comprehension Reading Status: Not tested    Expression Expression Primary Mode of Expression: Verbal Verbal Expression Overall Verbal Expression: Impaired Initiation: No impairment Naming: Impairment Confrontation: Impaired Written Expression Dominant Hand: Left Written Expression: Not tested   Oral / Motor  Oral Motor/Sensory Function Overall Oral Motor/Sensory Function: Moderate impairment Facial ROM: Reduced left Facial Symmetry: Abnormal symmetry left Lingual ROM: Reduced right;Reduced left Motor Speech Overall Motor Speech: Impaired Respiration: Within functional limits Phonation: Breathy;Low vocal intensity Resonance: Within functional limits Articulation: Impaired Level of Impairment: Word Intelligibility: Intelligibility reduced Word: 50-74% accurate Phrase: 25-49% accurate Sentence: 25-49% accurate Conversation: 25-49% accurate Motor Planning: Not tested    GO                   Colin Mulders M.S., CCC-SLP Acute Rehabilitation Services Office: [phone]  Elvia Collum Shemar Plemmons 10/08/2020, 1:45 PM

## 2020-10-08 NOTE — Progress Notes (Signed)
Inpatient Rehab Admissions Coordinator Note:   Per therapy recommendations, pt was screened for CIR candidacy by Deztiny Sarra, MS CCC-SLP. At this time, Pt. Appears to have functional decline and is a good candidate for CIR. Will request order for rehab consult per protocol.  Please contact me with questions.   Mikeya Tomasetti, MS, CCC-SLP Rehab Admissions Coordinator  [phone] (celll) [phone] (office)  

## 2020-10-09 ENCOUNTER — Inpatient Hospital Stay (HOSPITAL_COMMUNITY): Payer: PPO

## 2020-10-09 DIAGNOSIS — I5022 Chronic systolic (congestive) heart failure: Secondary | ICD-10-CM

## 2020-10-09 DIAGNOSIS — Z9889 Other specified postprocedural states: Secondary | ICD-10-CM

## 2020-10-09 DIAGNOSIS — A419 Sepsis, unspecified organism: Secondary | ICD-10-CM | POA: Diagnosis not present

## 2020-10-09 DIAGNOSIS — I4821 Permanent atrial fibrillation: Secondary | ICD-10-CM | POA: Diagnosis not present

## 2020-10-09 DIAGNOSIS — I2510 Atherosclerotic heart disease of native coronary artery without angina pectoris: Secondary | ICD-10-CM

## 2020-10-09 DIAGNOSIS — Z9862 Peripheral vascular angioplasty status: Secondary | ICD-10-CM

## 2020-10-09 DIAGNOSIS — I69952 Hemiplegia and hemiparesis following unspecified cerebrovascular disease affecting left dominant side: Secondary | ICD-10-CM

## 2020-10-09 DIAGNOSIS — R7881 Bacteremia: Secondary | ICD-10-CM | POA: Diagnosis not present

## 2020-10-09 LAB — GLUCOSE, CAPILLARY
Glucose-Capillary: 108 mg/dL — ABNORMAL HIGH (ref 70–99)
Glucose-Capillary: 113 mg/dL — ABNORMAL HIGH (ref 70–99)
Glucose-Capillary: 123 mg/dL — ABNORMAL HIGH (ref 70–99)
Glucose-Capillary: 124 mg/dL — ABNORMAL HIGH (ref 70–99)
Glucose-Capillary: 131 mg/dL — ABNORMAL HIGH (ref 70–99)
Glucose-Capillary: 138 mg/dL — ABNORMAL HIGH (ref 70–99)
Glucose-Capillary: 90 mg/dL (ref 70–99)
Glucose-Capillary: 97 mg/dL (ref 70–99)

## 2020-10-09 LAB — CULTURE, BLOOD (ROUTINE X 2): Special Requests: ADEQUATE

## 2020-10-09 LAB — CBC
HCT: 29 % — ABNORMAL LOW (ref 39.0–52.0)
Hemoglobin: 8.8 g/dL — ABNORMAL LOW (ref 13.0–17.0)
MCH: 23.1 pg — ABNORMAL LOW (ref 26.0–34.0)
MCHC: 30.3 g/dL (ref 30.0–36.0)
MCV: 76.1 fL — ABNORMAL LOW (ref 80.0–100.0)
Platelets: 262 10*3/uL (ref 150–400)
RBC: 3.81 MIL/uL — ABNORMAL LOW (ref 4.22–5.81)
RDW: 18.6 % — ABNORMAL HIGH (ref 11.5–15.5)
WBC: 15.2 10*3/uL — ABNORMAL HIGH (ref 4.0–10.5)
nRBC: 0.2 % (ref 0.0–0.2)

## 2020-10-09 LAB — CULTURE, RESPIRATORY W GRAM STAIN
Culture: NO GROWTH
Gram Stain: NONE SEEN

## 2020-10-09 LAB — BASIC METABOLIC PANEL
Anion gap: 15 (ref 5–15)
BUN: 41 mg/dL — ABNORMAL HIGH (ref 8–23)
CO2: 17 mmol/L — ABNORMAL LOW (ref 22–32)
Calcium: 8.4 mg/dL — ABNORMAL LOW (ref 8.9–10.3)
Chloride: 118 mmol/L — ABNORMAL HIGH (ref 98–111)
Creatinine, Ser: 1.35 mg/dL — ABNORMAL HIGH (ref 0.61–1.24)
GFR, Estimated: 50 mL/min — ABNORMAL LOW (ref >60)
Glucose, Bld: 118 mg/dL — ABNORMAL HIGH (ref 70–99)
Potassium: 3.3 mmol/L — ABNORMAL LOW (ref 3.5–5.1)
Sodium: 150 mmol/L — ABNORMAL HIGH (ref 135–145)

## 2020-10-09 LAB — MAGNESIUM: Magnesium: 2.1 mg/dL (ref 1.7–2.4)

## 2020-10-09 MED ORDER — ASPIRIN EC 325 MG PO TBEC
325.0000 mg | DELAYED_RELEASE_TABLET | Freq: Every day | ORAL | Status: DC
  Administered 2020-10-10: 325 mg via ORAL
  Filled 2020-10-09: qty 1

## 2020-10-09 MED ORDER — ATORVASTATIN CALCIUM 10 MG PO TABS
20.0000 mg | ORAL_TABLET | Freq: Every day | ORAL | Status: DC
  Administered 2020-10-10 – 2020-10-14 (×5): 20 mg via ORAL
  Filled 2020-10-09 (×5): qty 2

## 2020-10-09 MED ORDER — ASPIRIN EC 81 MG PO TBEC: 81.0000 mg | DELAYED_RELEASE_TABLET | Freq: Every day | ORAL | Status: DC

## 2020-10-09 NOTE — Progress Notes (Signed)
STROKE TEAM PROGRESS NOTE   INTERVAL HISTORY His RN is at the bedside.  Pt on room air, O2 sat 93-95%. Pt still has tachypnea, especially with even mild exertion. Cardiology on board. Repeat CT head no acute finding. He passed barium swallow, now on dys1 and thin liquid.    OBJECTIVE Vitals:   10/09/20 0300 10/09/20 0352 10/09/20 0400 10/09/20 0500  BP: 123/71  (!) 143/58 127/64  Pulse:   76 76  Resp: (!) 27  (!) 24 (!) 23  Temp:  98.5 F (36.9 C)    TempSrc:  Oral    SpO2:   92% (!) 89%  Weight:      Height:        CBC:  Recent Labs  Lab 10/06/20 1042 10/06/20 1738 10/07/20 0539 10/08/20 0225  WBC 17.1*  --  17.1* 17.7*  NEUTROABS 15.0*  --   --   --   HGB 9.1*   < > 7.9* 8.2*  HCT 30.1*   < > 25.6* 27.5*  MCV 76.2*  --  74.9* 76.0*  PLT 242  --  196 228   < > = values in this interval not displayed.    Basic Metabolic Panel:  Recent Labs  Lab 10/07/20 0539 10/08/20 0225  NA 140 146*  K 3.2* 4.3  CL 114* 118*  CO2 16* 16*  GLUCOSE 110* 108*  BUN 32* 34*  CREATININE 1.23 1.48*  CALCIUM 7.4* 7.9*  MG  --  1.9    Lipid Panel:     Component Value Date/Time   CHOL 87 10/07/2020 0539   TRIG 42 10/07/2020 0539   TRIG 39 10/07/2020 0539   HDL 53 10/07/2020 0539   CHOLHDL 1.6 10/07/2020 0539   VLDL 8 10/07/2020 0539   LDLCALC 26 10/07/2020 0539   HgbA1c:  Lab Results  Component Value Date   HGBA1C 5.7 (H) 10/07/2020   Urine Drug Screen:     Component Value Date/Time   LABOPIA NONE DETECTED 07/02/2020 1830   COCAINSCRNUR NONE DETECTED 07/02/2020 1830   LABBENZ NONE DETECTED 07/02/2020 1830   AMPHETMU NONE DETECTED 07/02/2020 1830   THCU NONE DETECTED 07/02/2020 1830   LABBARB NONE DETECTED 07/02/2020 1830    Alcohol Level     Component Value Date/Time   ETH <10 10/06/2020 1727    IMAGING  MRI BRAIN WO CONTRAST - pending Pt has PPM?  CT HEAD WO CONTRAST No acute finding by CT. Chronic small-vessel ischemic changes throughout the brain  as outlined above.  CT Code Stroke CTA Head W/WO contrast CT Code Stroke CTA Neck W/WO contrast CT PERFUSION BRAIN 10/06/2020 IMPRESSION:  1. No emergent large vessel occlusion.  2. Persistent radiographic string sign of the proximal right ICA.  3. Delayed transit throughout the entire right cerebral hemisphere and left ACA territory related to the high-grade proximal right ICA stenosis and chronically diminutive/diseased left ACA. No core infarct identified by CTP.  4. Severe right P2 stenosis.  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  6. Bilateral ground-glass lung opacities which could reflect edema or infection. Moderate right pleural effusion.  7.  Aortic Atherosclerosis (ICD10-I70.0).   CT HEAD WO CONTRAST 10/07/2020 IMPRESSION:  No acute finding by CT. Chronic small-vessel ischemic changes throughout the brain as outlined above.   DG Chest Port 1 View 10/06/2020 IMPRESSION:  Enlargement of cardiac silhouette with pulmonary vascular congestion. Increased interstitial markings in the mid to lower lungs question minimal edema or developing chronic interstitial lung disease.  CT HEAD CODE STROKE WO CONTRAST 10/06/2020 IMPRESSION:  1. Hyperdense Right MCA suspicious for Emergent Large Vessel Occlusion.  2. No acute cortically based infarct or acute intracranial hemorrhage identified. ASPECTS 10.  3. Stable CT appearance of brain parenchyma since August.   Transthoracic Echocardiogram  1. Akinesis of the distal septum and apex; overall mildly reduced LV  function; no thrombus noted using definity.  2. Left ventricular ejection fraction, by estimation, is 40 to 45%. The  left ventricle has mildly decreased function. The left ventricle  demonstrates regional wall motion abnormalities (see scoring  diagram/findings for description). Left ventricular  diastolic function could not be evaluated.  3. Right ventricular systolic function is normal. The right ventricular  size  is normal.  4. Left atrial size was severely dilated.  5. Right atrial size was severely dilated.  6. The mitral valve is normal in structure. Mild mitral valve  regurgitation. No evidence of mitral stenosis.  7. Tricuspid valve regurgitation is moderate.  8. The aortic valve is tricuspid. Aortic valve regurgitation is mild.  Mild aortic valve stenosis.  9. The inferior vena cava is normal in size with greater than 50%  respiratory variability, suggesting right atrial pressure of 3 mmHg.    ECG - atrial fibrillation - ventricular response 78 BPM (See cardiology reading for complete details)  CT HEAD WO CONTRAST  Result Date: 10/09/2020 CLINICAL DATA:  Stroke follow-up EXAM: CT HEAD WITHOUT CONTRAST TECHNIQUE: Contiguous axial images were obtained from the base of the skull through the vertex without intravenous contrast. COMPARISON:  None. FINDINGS: Brain: There is no mass, hemorrhage or extra-axial collection. There is generalized atrophy without lobar predilection. Hypodensity of the white matter is most commonly associated with chronic microvascular disease. Old small vessel infarcts of the left thalamus and left cerebellum. Vascular: No abnormal hyperdensity of the major intracranial arteries or dural venous sinuses. No intracranial atherosclerosis. Skull: The visualized skull base, calvarium and extracranial soft tissues are normal. Sinuses/Orbits: Right mastoid effusion. Nasopharynx is clear. Paranasal sinuses are unremarkable. The orbits are normal. IMPRESSION: 1. No acute intracranial abnormality. 2. Old small vessel infarcts of the left thalamus and left cerebellum. 3. Right mastoid effusion. Electronically Signed   By: Ulyses Jarred M.D.   On: 10/09/2020 03:09   DG Chest Port 1 View  Result Date: 10/09/2020 CLINICAL DATA:  Congestive heart failure EXAM: PORTABLE CHEST 1 VIEW COMPARISON:  10/08/2020 FINDINGS: Lung volumes are small, but are symmetric and are stable since prior  examination. Mild interstitial pulmonary infiltrate has improved in the interval since prior examination, though persists. Tiny bilateral pleural effusions are present, decreased in size since prior examination. No pneumothorax. Mild to moderate cardiomegaly is unchanged. Left subclavian dual lead pacemaker is unchanged. No acute bone abnormality. IMPRESSION: Stable pulmonary hypoinflation. Improving cardiogenic failure. Stable mild-to-moderate cardiomegaly. Electronically Signed   By: Fidela Salisbury MD   On: 10/09/2020 06:21     PHYSICAL EXAM  Temp:  [98.1 F (36.7 C)-98.8 F (37.1 C)] 98.5 F (36.9 C) (11/15 0352) Pulse Rate:  [64-90] 76 (11/15 0500) Resp:  [19-37] 23 (11/15 0500) BP: (84-149)/(48-88) 127/64 (11/15 0500) SpO2:  [84 %-100 %] 89 % (11/15 0500)  General - Well nourished, well developed, mild SOB with tachypnea, more prominent with exertion  Ophthalmologic - fundi not visualized due to noncooperation.  Cardiovascular - irregularly irregular heart rate and rhythm  Neuro - eyes open, moderate to severe dysarthria but able to answer questions clearer today. Orientated to self, age, year  and situation but not to month. However, able to follow simple commands, able to name and repeat but with very dysarthric and SOB with talking. Eyes able to gaze bilaterally, visual field full. PERRL. Left facial droop. Tongue protrusion midline. RUE 4/5, LUE 4-/5 without drift and finger grip and wrist extension 3/5. BLE 3-/5, able to hold knee flexion and foot on bed position but LLE weaker than left. No babinski bilaterally. Sensation subjectively symmetrical, coordination slow but grossly intact bilaterally and gait not tested.   ASSESSMENT/PLAN [name] is a 86 y.o. male with history of atrial fibrillation on Xarelto, PPM, L CEA, CAD, Htn, HLD, dementia and squamous cell cancer R parotid resected 2014, presenting with left sided weakness. He did not receive IV t-PA due to  anticoagulation with Xarelto. Acute right ICA angioplasty with cerebral protection 10/06/20 at 11:28 PM Dr Earleen Newport.  Stroke: R MCA infarct due to right ICA high grade stenosis s/p angioplasty, etiology likely due to large vessel disease. However, given fever and bacteremia, needs to rule out endocarditis   CT Head - Hyperdense Right MCA suspicious for Emergent Large Vessel Occlusion. No acute cortically based infarct or acute intracranial hemorrhage identified. ASPECTS 10.   CTA H&N - Persistent radiographic string sign of the proximal right ICA. Severe right P2 stenosis.   CTP - Delayed transit throughout the entire right cerebral hemisphere and left ACA territory related to the high-grade proximal right ICA stenosis and chronically diminutive/diseased left ACA. No core infarct identified by CTP.   IR - right ICA angioplasty, 86%->41% stenosis post angioplasty  CT head repeat x 2 - No acute finding by CT. Chronic small-vessel ischemic changes throughout the brain as outlined above.   2D Echo - EF 40 to 45% down from 60-65% in 06/2020, akinesis of the distal septum and apex  Carotid doppler pending  Lacey Jensen Virus 2 - negative  LDL - 26  HgbA1c - 5.7  UDS - not ordered  VTE prophylaxis - Lovenox  Xarelto (rivaroxaban) daily prior to admission, now on aspirin 325 mg daily. Consider resume AC once endocarditis ruled out.    Patient counseled to be compliant with his antithrombotic medications  Ongoing aggressive stroke risk factor management  Therapy recommendations:  CIR  Disposition:  Pending  Carotid stenosis  Hx of left ICA stenosis s/p left CEA  06/2020 CT head and neck showed right ICA bulb string sign  This admission CT head and neck again showed right ICA bulb string sign, status post angioplasty.  Right ICA 86% -> 41% stenosis post-procedure  Carotid Doppler pending  Chronic afib Tachy-brady syndrome s/p pacer Cardiomyopathy  Pulmonary edema  On Xarelto  PTA  Currently on aspirin  Consider resume AC once rule out endocarditis  2D Echo - EF 40 to 45% down from 60-65% in 06/2020, akinesis of the distal septum and apex  CXR concerning for pulmonary edema -> improved  Still has tachypnea   Cardiology on board - appreciate help  Bacteremia  Fever Leukocytosis  Tmax 101.1->afebrile  On Abx   Blood culture 2/2 group B strep - pan sensitive  WBC 17.1-> 17.1->17.7  Cardiology on board, will repeat TTE first. May still need TEE to rule out endocarditis  CCM on board  Hx of TIA  06/2020 admitted to [facility] for confusion and slurry speech with slump over and left facial droop. CTA head and neck showed right ICA bulb string sign. EF 60-65%, LDL 61 and A1C 5.9.   Hx of Hypertension  Hypotension   Home BP meds: metoprolol  Current BP meds: off levophed  SBP stable now . Long-term BP goal normotensive  Hyperlipidemia  Home Lipid lowering medication: Lipitor 40 mg daily  LDL 26, goal < 70  Resume lipitor 20  Continue statin at discharge  Dysphagia   Passed barium study 11/15  On gentle IVF  On dys1 and thin  Speech on board  Other Stroke Risk Factors  Advanced age  Former cigarette smoker - quit 47 years ago  ETOH use, pt has 1 drink per week  Coronary artery disease  Other Active Problems   Code status - DNR  Aortic Atherosclerosis (ICD10-I70.0).   Acute blood loss anemia - Hgb - 8.8->7.9->8.2 CKD - stage 3b - creatinine - 1.30->1.23->1.48 - on gentle hydration  Dementia - lives with son   Hospital day # 3  This patient is critically ill due to heart failure, s/p pacer, stroke, carotid stenosis s/p angioplasty, bacteremia, afib and at significant risk of neurological worsening, death form recurrent stroke, hemorrhagic conversion, sepsis, endocarditis, heart failure. This patient's care requires constant monitoring of vital signs, hemodynamics, respiratory and cardiac monitoring, review of multiple  databases, neurological assessment, discussion with family, other specialists and medical decision making of high complexity. I spent 35 minutes of neurocritical care time in the care of this patient.  Rosalin Hawking, MD PhD Stroke Neurology 10/09/2020 10:22 AM    To contact Stroke Continuity provider, please refer to [email or web address] After hours, contact General Neurology

## 2020-10-09 NOTE — Progress Notes (Signed)
Inpatient Rehab Admissions Coordinator:   Met with patient at bedside this AM to continue discussion regarding CIR admission. Pt. Is not yet medically ready for d/c for CIR and I have not yet opened a case with Pt.'s insurance.  [facility] team will continue to follow for potential admission late this week, pending medical readiness, bed availability, and insurance auth.   Clemens Catholic, [place], [place] Admissions Coordinator  [phone] ([place]) [phone] (office)

## 2020-10-09 NOTE — Progress Notes (Signed)
Modified Barium Swallow Progress Note  Patient Details  Name: Fernando [name] MRN: [id number] Date of Birth: 09-Apr-1927  Today's Date: 10/09/2020  Modified Barium Swallow completed.  Full report located under Chart Review in the Imaging Section.  Brief recommendations include the following:  Clinical Impression  Pt was seen for MBS which revealed a moderate oropharyngeal dysphagia, suspected due to baseline structural abnormalities of the cervical spine exacerbated by weakness. Pt's dysphagia is characterized by premature spillage and reduced epiglottic deflection, causing instances of penetration with liquids. The pt demonstrates moderate oral weakness, and reduced left facial ROM resulted in difficulty creating an adequate labial seal for cup sips. However, pt could acheive labial seal for straw sips. Premature spillage of thin liquids fell to the pyriforms, resulting in a moderate amount of material penetrating above the level of the vocal folds prior to initiating the swallow. The pt also demonstrated reduced epiglottic deflection, causing several instances of penetration of thin, nectar, and honey thick liquid during the swallow. When cued to produce a cough, pt would successfully eject the material from the airway and independently produce an aditional swallow in order to fully clear it. Despite consistent penetration of liquids, no aspiration was observed. Mild pharyngeal residue was also observed in the valleculae and pyriforms across POs, but most consistently with puree. Reduced UES opening was observed secondary to appearance of cervical lordosis. Recommend dys 1 diet with thin liquids. Pt should follow solids with liquids and produce frequent volitional coughing after POs. SLP will continue to f/u acutely for diet toleration and advancement.    Swallow Evaluation Recommendations       SLP Diet Recommendations: Dysphagia 1 (Puree) solids;Thin liquid   Liquid Administration via:  Straw   Medication Administration: Crushed with puree   Supervision: Full supervision/cueing for compensatory strategies;Staff to assist with self feeding   Compensations: Hard cough after swallow;Follow solids with liquid   Postural Changes: Seated upright at 90 degrees   Oral Care Recommendations: Oral care BID        Greggory Keen 10/09/2020,12:08 PM

## 2020-10-09 NOTE — Progress Notes (Signed)
    [facility] has been requested to perform a transesophageal echocardiogram on Fernando [name] for bacteremia.  After careful review of history and examination, the risks and benefits of transesophageal echocardiogram have been explained including risks of esophageal damage, perforation (1:10,000 risk), bleeding, pharyngeal hematoma as well as other potential complications associated with conscious sedation including aspiration, arrhythmia, respiratory failure and death. Alternatives to treatment were discussed, questions were answered. Discussed procedure with son, Haitham Dolinsky, who is will to proceed. Patient is willing to proceed.   Jaylah Goodlow Ninfa Meeker, PA-C  10/09/2020 12:13 PM

## 2020-10-09 NOTE — Progress Notes (Signed)
NAME:  Fernando [name], MRN:  [id number], DOB:  [birth year], LOS: 3 ADMISSION DATE:  10/06/2020, CONSULTATION DATE: 10/07/2020 REFERRING MD:  Dr. Leonel Ramsay, Neurology, CHIEF COMPLAINT:  Stroke   Brief History   86 yo male former smoker presented with Lt sided weakness, aphasia, and altered mental status.  Found to have large Rt MCA stenosis and had stenting.  Required intubation for procedure.  Noted to have fever 101.34F and found to have Streptococcus agalactiae bacteremia.  Past Medical History  A fib on xarelto, carotid artery disease s/p Lt CEA, CAD, Squamous cell carcinoma of parotic, tachy-brady syndrome s/p PM, HTN, HLD, Hypothyroidism  Significant Hospital Events   11/12 Admit, cerebral angiogram with angioplasty 11/13 extubated; neuro spoke with family >> DNR, continue other medical care  Consults:  Cardiology   Procedures:  ETT 11/12 >>11/13  Significant Diagnostic Tests:   CT head 11/12 >> hyperdense Rt MCA, chronic Lt cerebellar infarct, mild chronic encephalomalacia Rt occipital pole, chronic lacunar infarct Lt thalamus  CT angio head/neck 11/12 >> string sign of proximal Rt ICA, high grade proxomal Rt ICA stenosis, severe Rt P2 stenosis  Echo 11/13 >> EF 40 to 45%, severely dilated LA/RA, mild MR, mod TR, mild AR and AS  CXR 11/15 >> improving pulmonary edema  Micro Data:  COVID/Flu 11/12 >> negative Blood 11/12 >> Streptococcus agalactiae  MRSA PCR 11/13 >> negative Sputum 11/13 >>   Antimicrobials:  Zithromax 11/12 Cefepime 11/12 Flagyl 11/12  Vancomycin 11/12  Rocephin 11/12 >>   Interim history/subjective:  Complains of thirst. Denies dyspnea.   Objective   Blood pressure (!) 147/46, pulse 70, temperature 98.8 F (37.1 C), temperature source Axillary, resp. rate (!) 29, height 5\' 5"  (1.651 m), weight 64.9 kg, SpO2 96 %.        Intake/Output Summary (Last 24 hours) at 10/09/2020 0827 Last data filed at 10/09/2020 0800 Gross per 24 hour    Intake 872.66 ml  Output 1200 ml  Net -327.34 ml   Filed Weights   10/06/20 1300  Weight: 64.9 kg    Examination:  General - alert, answers appropriately to questions.  Eyes - pupils reactive, no scleral icterus.  ENT - edentulous, dry furrowed tongue.  Cardiac - irregular rhythm, HS normal, no murmur, no JVD Chest - clear at bases.  Abdomen - soft, non tender, + bowel sounds Extremities - no cyanosis, clubbing, or edema Skin - no rashes Neuro - 4+/5 strength on the right 3/5 on left.  Psych - normal mood and behavior   Resolved Hospital Problem list   Community acquired pneumonia ruled out, Lactic acidosis, Compromised airway in setting of CVA  Assessment & Plan:    Acute hypoxic respiratory insufficiency from acute pulmonary edema and Rt pleural effusion. - goal SpO2 > 92% - appears euvolemic today,.   Streptococcal agalactiae bacteremia. - day 3 of ABx - ideally needs TEE to establish duration of therapy  Acute CVA with Rt MCA stenosis s/p angioplasty. - continue ASA - Modified barium to assess for dysphagia.  - Will likely need a Cortrak for the short term.   New onset acute systolic CHF. Persistent A fib, HTN, HLD. - continue lipitor once able to swallow pills - on xarelto as outpt; resume anticoagulation when okay with neurology - hold outpt lopressor for now  Hx of hypothyroidism. - resume synthroid when able to swallow pills  Hyperglycemia. - SSI  Hypokalemia. - f/u BMET  Anemia of critical illness. - f/u CBC - transfuse for  Hb < 7 or significant bleeding  Best practice:  Diet: NPO, ice chips DVT prophylaxis: Lovenox GI prophylaxis: Not indicated Mobility: PT/OT >> recommending CIR Code Status: DNR Disposition: ICU Family communication: will update family.   Labs    CMP Latest Ref Rng & Units 10/08/2020 10/07/2020 10/07/2020  Glucose 70 - 99 mg/dL 108(H) 110(H) -  BUN 8 - 23 mg/dL 34(H) 32(H) -  Creatinine 0.61 - 1.24 mg/dL 1.48(H)  1.23 -  Sodium 135 - 145 mmol/L 146(H) 140 145  Potassium 3.5 - 5.1 mmol/L 4.3 3.2(L) 3.7  Chloride 98 - 111 mmol/L 118(H) 114(H) -  CO2 22 - 32 mmol/L 16(L) 16(L) -  Calcium 8.9 - 10.3 mg/dL 7.9(L) 7.4(L) -  Total Protein 6.5 - 8.1 g/dL - - -  Total Bilirubin 0.3 - 1.2 mg/dL - - -  Alkaline Phos 38 - 126 U/L - - -  AST 15 - 41 U/L - - -  ALT 0 - 44 U/L - - -    CBC Latest Ref Rng & Units 10/08/2020 10/07/2020 10/07/2020  WBC 4.0 - 10.5 K/uL 17.7(H) 17.1(H) -  Hemoglobin 13.0 - 17.0 g/dL 8.2(L) 7.9(L) 8.8(L)  Hematocrit 39 - 52 % 27.5(L) 25.6(L) 26.0(L)  Platelets 150 - 400 K/uL 228 196 -    ABG    Component Value Date/Time   PHART 7.339 (L) 10/07/2020 0242   PCO2ART 33.4 10/07/2020 0242   PO2ART 280 (H) 10/07/2020 0242   HCO3 18.3 (L) 10/07/2020 0242   TCO2 19 (L) 10/07/2020 0242   ACIDBASEDEF 7.0 (H) 10/07/2020 0242   O2SAT 100.0 10/07/2020 0242    CBG (last 3)  Recent Labs    10/08/20 2333 10/09/20 0355 10/09/20 0808  GLUCAP [place], MD [facility] ICU Physician [place]  Pager: [phone] Mobile: [phone] After hours: [phone].  10/09/2020, 8:43 AM      10/09/2020, 8:27 AM

## 2020-10-09 NOTE — Discharge Instructions (Signed)
Femoral/Radial Site Care This sheet gives you information about how to care for yourself after your procedure. Your health care provider may also give you more specific instructions. If you have problems or questions, contact your health care provider. What can I expect after the procedure? After the procedure, it is common to have:  Bruising that usually fades within 1-2 weeks.  Tenderness at the site. Follow these instructions at home: Wound care 1. Follow instructions from your health care provider about how to take care of your insertion site. Make sure you: ? Wash your hands with soap and water before you change your bandage (dressing). If soap and water are not available, use hand sanitizer. ? Change your dressing as directed- pressure dressing removed 24 hours post-procedure (and switch for bandaid), bandaid removed 72 hours post-procedure 2. Do not take baths, swim, or use a hot tub for 7 days post-procedure. 3. You may shower 48 hours after the procedure or as told by your health care provider. ? Gently wash the site with plain soap and water. ? Pat the area dry with a clean towel. ? Do not rub the site. This may cause bleeding. 4. Check your site every day for signs of infection. Check for: ? Redness, swelling, or pain. ? Fluid or blood. ? Warmth. ? Pus or a bad smell. Activity  Do not stoop, bend, or lift anything that is heavier than 10 lb (4.5 kg) for 2 weeks post-procedure.  Do not drive self for 2 weeks post-procedure. Contact a health care provider if you have:  A fever or chills.  You have redness, swelling, or pain around your insertion site. Get help right away if:  The catheter insertion area swells very fast.  You pass out.  You suddenly start to sweat or your skin gets clammy.  The catheter insertion area is bleeding, and the bleeding does not stop when you hold steady pressure on the area.  The area near or just beyond the catheter insertion site  becomes pale, cool, tingly, or numb. These symptoms may represent a serious problem that is an emergency. Do not wait to see if the symptoms will go away. Get medical help right away. Call your local emergency services (911 in the [place] Do not drive yourself to the hospital.  This information is not intended to replace advice given to you by your health care provider. Make sure you discuss any questions you have with your health care provider. Document Revised: 11/24/2017 Document Reviewed: 11/24/2017 Elsevier Patient Education  2020 [employer].

## 2020-10-09 NOTE — Progress Notes (Addendum)
Progress Note  Patient Name: Fernando [name] Date of Encounter: 10/09/2020  [facility] Cardiologist: Dr. Lovena Le  Subjective   He is thirsty and asking for water. Swallow study this morning. He denies CP or SOB. Left sided weakness improving. He is hard of hearing.  Inpatient Medications    Scheduled Meds: . aspirin  300 mg Rectal Daily   Or  . aspirin  325 mg Oral Daily  . atorvastatin  80 mg Oral Daily  . Chlorhexidine Gluconate Cloth  6 each Topical Daily  . docusate sodium  100 mg Oral Daily  . enoxaparin (LOVENOX) injection  30 mg Subcutaneous Q24H  . insulin aspart  0-9 Units Subcutaneous Q4H  . levothyroxine  50 mcg Oral Q0600  . mouth rinse  15 mL Mouth Rinse BID  . polyethylene glycol  17 g Oral Daily   Continuous Infusions: . sodium chloride 30 mL/hr at 10/09/20 0800  . cefTRIAXone (ROCEPHIN)  IV Stopped (10/09/20 0028)  . norepinephrine (LEVOPHED) Adult infusion Stopped (10/08/20 2357)   PRN Meds: acetaminophen **OR** acetaminophen   Vital Signs    Vitals:   10/09/20 0700 10/09/20 0715 10/09/20 0730 10/09/20 0800  BP: (!) 144/62 135/85 (!) 148/65 (!) 147/46  Pulse: 73 82 90 70  Resp: (!) 23 (!) 29 (!) 26 (!) 29  Temp:    98.8 F (37.1 C)  TempSrc:    Axillary  SpO2: 92% 96% 97% 96%  Weight:      Height:        Intake/Output Summary (Last 24 hours) at 10/09/2020 0839 Last data filed at 10/09/2020 0800 Gross per 24 hour  Intake 872.66 ml  Output 1200 ml  Net -327.34 ml   Last 3 Weights 10/06/2020 07/02/2020 05/08/2020  Weight (lbs) 143 lb 143 lb 14.4 oz 130 lb  Weight (kg) 64.864 kg 65.273 kg 58.968 kg      Telemetry    Afib HR 80s, PVCs - Personally Reviewed  ECG    Rate controlled Afib, 78bpm - Personally Reviewed  Physical Exam   GEN: No acute distress.   Neck: No JVD Cardiac: Irreg Irreg, no murmurs, rubs, or gallops.  Respiratory: Clear to auscultation bilaterally. GI: Soft, nontender, non-distended  MS: No edema; No  deformity. Neuro:  Nonfocal  Psych: Normal affect   Labs    High Sensitivity Troponin:  No results for input(s): TROPONINIHS in the last 720 hours.    Chemistry Recent Labs  Lab 10/06/20 1042 10/06/20 1042 10/06/20 1738 10/06/20 1738 10/07/20 0242 10/07/20 0539 10/08/20 0225  NA 138   < > 142   < > 145 140 146*  K 3.8   < > 3.7   < > 3.7 3.2* 4.3  CL 102   < > 109  --   --  114* 118*  CO2 20*  --   --   --   --  16* 16*  GLUCOSE 172*   < > 145*  --   --  110* 108*  BUN 37*   < > 32*  --   --  32* 34*  CREATININE 1.49*   < > 1.30*  --   --  1.23 1.48*  CALCIUM 9.1  --   --   --   --  7.4* 7.9*  PROT 7.8  --   --   --   --   --   --   ALBUMIN 3.7  --   --   --   --   --   --  AST 29  --   --   --   --   --   --   ALT 18  --   --   --   --   --   --   ALKPHOS 62  --   --   --   --   --   --   BILITOT 1.0  --   --   --   --   --   --   GFRNONAA 44*  --   --   --   --  55* 44*  ANIONGAP 16*  --   --   --   --  10 12   < > = values in this interval not displayed.     Hematology Recent Labs  Lab 10/06/20 1042 10/06/20 1738 10/07/20 0242 10/07/20 0539 10/08/20 0225  WBC 17.1*  --   --  17.1* 17.7*  RBC 3.95*  --   --  3.42* 3.62*  HGB 9.1*   < > 8.8* 7.9* 8.2*  HCT 30.1*   < > 26.0* 25.6* 27.5*  MCV 76.2*  --   --  74.9* 76.0*  MCH 23.0*  --   --  23.1* 22.7*  MCHC 30.2  --   --  30.9 29.8*  RDW 18.2*  --   --  17.9* 18.4*  PLT 242  --   --  196 228   < > = values in this interval not displayed.    BNP Recent Labs  Lab 10/06/20 1042  BNP 1,097.0*     DDimer No results for input(s): DDIMER in the last 168 hours.   Radiology    CT HEAD WO CONTRAST  Result Date: 10/09/2020 CLINICAL DATA:  Stroke follow-up EXAM: CT HEAD WITHOUT CONTRAST TECHNIQUE: Contiguous axial images were obtained from the base of the skull through the vertex without intravenous contrast. COMPARISON:  None. FINDINGS: Brain: There is no mass, hemorrhage or extra-axial collection. There  is generalized atrophy without lobar predilection. Hypodensity of the white matter is most commonly associated with chronic microvascular disease. Old small vessel infarcts of the left thalamus and left cerebellum. Vascular: No abnormal hyperdensity of the major intracranial arteries or dural venous sinuses. No intracranial atherosclerosis. Skull: The visualized skull base, calvarium and extracranial soft tissues are normal. Sinuses/Orbits: Right mastoid effusion. Nasopharynx is clear. Paranasal sinuses are unremarkable. The orbits are normal. IMPRESSION: 1. No acute intracranial abnormality. 2. Old small vessel infarcts of the left thalamus and left cerebellum. 3. Right mastoid effusion. Electronically Signed   By: Ulyses Jarred M.D.   On: 10/09/2020 03:09   DG Chest Port 1 View  Result Date: 10/09/2020 CLINICAL DATA:  Congestive heart failure EXAM: PORTABLE CHEST 1 VIEW COMPARISON:  10/08/2020 FINDINGS: Lung volumes are small, but are symmetric and are stable since prior examination. Mild interstitial pulmonary infiltrate has improved in the interval since prior examination, though persists. Tiny bilateral pleural effusions are present, decreased in size since prior examination. No pneumothorax. Mild to moderate cardiomegaly is unchanged. Left subclavian dual lead pacemaker is unchanged. No acute bone abnormality. IMPRESSION: Stable pulmonary hypoinflation. Improving cardiogenic failure. Stable mild-to-moderate cardiomegaly. Electronically Signed   By: Fidela Salisbury MD   On: 10/09/2020 06:21   DG Chest Port 1 View  Result Date: 10/08/2020 CLINICAL DATA:  Shortness of breath EXAM: PORTABLE CHEST 1 VIEW COMPARISON:  October 06, 2020 FINDINGS: There is cardiomegaly with pulmonary venous hypertension. Pacemaker leads are attached to the right atrium  and right ventricle. There are small pleural effusions bilaterally with mild interstitial edema. No consolidation. There is aortic atherosclerosis. No  adenopathy. No bone lesions. IMPRESSION: Cardiomegaly with pulmonary vascular congestion. Small pleural effusions bilaterally with mild interstitial edema. Suspect a degree of underlying congestive heart failure. Pacemaker leads attached to right atrium and right ventricle. Aortic Atherosclerosis (ICD10-I70.0). Electronically Signed   By: Lowella Grip III M.D.   On: 10/08/2020 09:57   ECHOCARDIOGRAM COMPLETE  Result Date: 10/07/2020    ECHOCARDIOGRAM REPORT   Patient Name:   MAMOUDOU MULVEHILL Date of Exam: 10/07/2020 Medical Rec #:  [id number]      Height:       65.0 in Accession #:    [id number]     Weight:       143.0 lb Date of Birth:  04/16/1927     BSA:          1.715 m Patient Age:    50 years       BP:           119/82 mmHg Patient Gender: M              HR:           111 bpm. Exam Location:  Inpatient Procedure: 2D Echo, Intracardiac Opacification Agent, Color Doppler and Cardiac            Doppler Indications:    Stroke  History:        Patient has prior history of Echocardiogram examinations, most                 recent 07/03/2020. CAD, Pacemaker, Arrythmias:Atrial Fibrillation;                 Risk Factors:Dyslipidemia, Hypertension and Former Smoker.                 Tachy-Brady syndrome.  Sonographer:    Clayton Lefort RDCS (AE) Referring Phys: [id number] [place]  1. Akinesis of the distal septum and apex; overall mildly reduced LV function; no thrombus noted using definity.  2. Left ventricular ejection fraction, by estimation, is 40 to 45%. The left ventricle has mildly decreased function. The left ventricle demonstrates regional wall motion abnormalities (see scoring diagram/findings for description). Left ventricular diastolic function could not be evaluated.  3. Right ventricular systolic function is normal. The right ventricular size is normal.  4. Left atrial size was severely dilated.  5. Right atrial size was severely dilated.  6. The mitral valve is normal in structure. Mild mitral  valve regurgitation. No evidence of mitral stenosis.  7. Tricuspid valve regurgitation is moderate.  8. The aortic valve is tricuspid. Aortic valve regurgitation is mild. Mild aortic valve stenosis.  9. The inferior vena cava is normal in size with greater than 50% respiratory variability, suggesting right atrial pressure of 3 mmHg. FINDINGS  Left Ventricle: Left ventricular ejection fraction, by estimation, is 40 to 45%. The left ventricle has mildly decreased function. The left ventricle demonstrates regional wall motion abnormalities. Definity contrast agent was given IV to delineate the left ventricular endocardial borders. The left ventricular internal cavity size was normal in size. There is no left ventricular hypertrophy. Left ventricular diastolic function could not be evaluated due to atrial fibrillation. Left ventricular diastolic function could not be evaluated. Right Ventricle: The right ventricular size is normal. Right ventricular systolic function is normal. Left Atrium: Left atrial size was severely dilated. Right Atrium: Right atrial size was severely dilated.  Pericardium: Trivial pericardial effusion is present. Mitral Valve: The mitral valve is normal in structure. Mild mitral annular calcification. Mild mitral valve regurgitation. No evidence of mitral valve stenosis. Tricuspid Valve: The tricuspid valve is normal in structure. Tricuspid valve regurgitation is moderate . No evidence of tricuspid stenosis. Aortic Valve: The aortic valve is tricuspid. Aortic valve regurgitation is mild. Aortic regurgitation PHT measures 292 msec. Mild aortic stenosis is present. Aortic valve mean gradient measures 10.5 mmHg. Aortic valve peak gradient measures 21.6 mmHg. Aortic valve area, by VTI measures 1.46 cm. Pulmonic Valve: The pulmonic valve was not well visualized. Pulmonic valve regurgitation is not visualized. No evidence of pulmonic stenosis. Aorta: The aortic root is normal in size and structure.  Venous: The inferior vena cava is normal in size with greater than 50% respiratory variability, suggesting right atrial pressure of 3 mmHg. IAS/Shunts: No atrial level shunt detected by color flow Doppler. Additional Comments: Akinesis of the distal septum and apex; overall mildly reduced LV function; no thrombus noted using definity. A pacer wire is visualized.  LEFT VENTRICLE PLAX 2D LVOT diam:     2.00 cm LV SV:         49 LV SV Index:   29 LVOT Area:     3.14 cm  RIGHT VENTRICLE            IVC RV Basal diam:  3.30 cm    IVC diam: 2.10 cm RV S prime:     9.46 cm/s TAPSE (M-mode): 1.3 cm LEFT ATRIUM            Index       RIGHT ATRIUM           Index LA Vol (A2C): 81.4 ml  47.46 ml/m RA Area:     31.90 cm LA Vol (A4C): 102.0 ml 59.47 ml/m RA Volume:   109.00 ml 63.55 ml/m  AORTIC VALVE AV Area (Vmax):    1.42 cm AV Area (Vmean):   1.28 cm AV Area (VTI):     1.46 cm AV Vmax:           232.50 cm/s AV Vmean:          147.500 cm/s AV VTI:            0.335 m AV Peak Grad:      21.6 mmHg AV Mean Grad:      10.5 mmHg LVOT Vmax:         105.00 cm/s LVOT Vmean:        60.100 cm/s LVOT VTI:          0.156 m LVOT/AV VTI ratio: 0.47 AI PHT:            292 msec  AORTA Ao Root diam: 3.50 cm Ao Asc diam:  3.00 cm TRICUSPID VALVE TR Peak grad:   57.8 mmHg TR Vmax:        380.00 cm/s  SHUNTS Systemic VTI:  0.16 m Systemic Diam: 2.00 cm Kirk Ruths MD Electronically signed by Kirk Ruths MD Signature Date/Time: 10/07/2020/4:08:38 PM    Final     Cardiac Studies   Echo 10/07/20 . Akinesis of the distal septum and apex; overall mildly reduced LV  function; no thrombus noted using definity.  2. Left ventricular ejection fraction, by estimation, is 40 to 45%. The  left ventricle has mildly decreased function. The left ventricle  demonstrates regional wall motion abnormalities (see scoring  diagram/findings for description). Left ventricular  diastolic function could not  be evaluated.  3. Right ventricular  systolic function is normal. The right ventricular  size is normal.  4. Left atrial size was severely dilated.  5. Right atrial size was severely dilated.  6. The mitral valve is normal in structure. Mild mitral valve  regurgitation. No evidence of mitral stenosis.  7. Tricuspid valve regurgitation is moderate.  8. The aortic valve is tricuspid. Aortic valve regurgitation is mild.  Mild aortic valve stenosis.  9. The inferior vena cava is normal in size with greater than 50%  respiratory variability, suggesting right atrial pressure of 3 mmHg.    Patient Profile     86 y.o. male with pmh of tachy-brady syndrome s/p pacemaker, permanent aib, carotid stenosis s/p L CEA and high grade proximal right carotid stenosis, recent TIA with left hemiparehsis and expressive aphasia in August 2021, dyslipidemia, HTN, osteoarthritis, remote squamous cell carcinoma  who is being seen for the evaluation of stroke and bacteremia.   Assessment & Plan    Sepsis/streptococcus agalactiae bacteremia - on admission patient had low grade fever, elevated WBC, elevated LA, hypotension and tachycardia - was on low dose norepinephrine - improving with IV abx - Echo showed  - likely needs TEE to evaluate for endocarditis  Right MCA stroke/history of TIA - known severe Internal carotid stenosis on the right side - permanent afib on Xarelto - continue ASA - Neurology following  Permanent Afib - denies missing doses on Xarelto PTA - rate controlled - May need evaluation of endocarditis prior to starting Xarelto. On aspirin  Tachy-brady syndrome s/p pacemaker - could possible by PPM lead endocarditis, plan for TEE however unsure how invasive treatment should be given his age - Leads are MRI conditional (can undergo MRI if absolutely necessary)  Cardiomyopathy/systolic dysfunction - Echo showed LVEF 40-45%, severely dilated biatrium, mod TR, mild AI - Unsure etiology of decreased EF - Likely has CAD  given carotid stenosis however ischemic work-up so far normal. He denies chest pain  - Appears euvolemic on exam - BP appears stable enough for CHF directed therapy however does have kidney insufficiency that might limit this - Will need repeat echo to reassess EF - s/p lasix for pulmonary edema. CXR from 11/15 shows this is improving  For questions or updates, please contact [place] Please consult [email or web address] for contact info under        Signed, Cadence Ninfa Meeker, PA-C  10/09/2020, 8:39 AM    Patient seen and examined and agree with Cadence Kathlen Mody, PA-C.   In brief, the patient is a 86 year old male with history tachy-brady syndrome s/p pacemaker placement, permanent aib, carotid stenosis s/p L CEA and high grade proximal right carotid stenosis, recent TIA with left hemiparehsis and expressive aphasia in August 2021, dyslipidemia, HTN, osteoarthritis, remote squamous cell carcinoma  who is being seen for the evaluation of right MCA stroke, strep agalactiae bacteremia, and new cardiomyopathy with LVEF 40-45%.   Patient presented with low grade fever, elevated WBC, lactic acidosis and strep bacteremia consistent with sepsis. Now improving with ABX and off pressors since this AM. TTE with newly reduced LVEF with akinesis of of the apex and EF 40-45%. No anginal symptoms and no ECG changes to suggest acute ischemia. No distinct vegetations visualized on surface TTE. Mild MR, mild AR. Likely not a candidate for device removal/surgical intervention (either early or delayed), however, TEE may be helpful to help determine duration of ABX as well as help guide decision about anticoagulation (currently on hold  given concern for possible septic emboli as etiology of stroke).  Exam: GEN: Elderly frail male sitting comfortably in bed Neck: No JVD Cardiac: RR, 2/6 systolic murmur best heeard at RUSB. PPM site c/d/i Respiratory: Clear to auscultation bilaterally. GI: Soft, nontender, non-distended   MS: No edema; No deformity. Neuro:  Dysarthria. Left sided weakness. Answering questions appropriately Psych: Normal affect   Plan: -Will discuss with patient and family about TEE tomorrow as will help determine duration of ABX as well as guide decision about resuming anticoagulation -Keep NPO at MN in case TEE -Given lack of ischemic symptoms and overall comorbidities, would not pursue invasive work-up of drop in EF and ?anteroseptal akinesis at this time; will continue with medical management -Continue IV ABX for bacteremia -Follow-up surveillance blood cultures -Holding [facility] pending TEE findings -Continue ASA 325mg  daily per neuro  Gwyndolyn Kaufman, MD

## 2020-10-09 NOTE — Progress Notes (Signed)
Carotid artery duplex completed. Refer to "CV Proc" under chart review to view preliminary results.  Preliminary results discussed with Dr. Erlinda Hong.  10/09/2020 10:58 AM Kelby Aline., MHA, RVT, RDCS, RDMS

## 2020-10-09 NOTE — Progress Notes (Signed)
Referring Physician(s): Code stroke- [name] (neurology)  Supervising Physician: Corrie Mckusick  Patient Status:  [facility] - In-pt  Chief Complaint: None  Subjective:  History of acute CVA s/p cerebral arteriogram with emergent revascularization of right ICA stenosis using balloon angioplasty via right femoral/radial approach 10/06/2020 by Dr. Earleen Newport. Patient awake and alert laying in bed. Is HOH. Follows simple commands. Oriented to person and place. Can spontaneously move all extremities with weakness of left side. Right femoral and right radial puncture sites c/d/i.  CT head this AM: 1. No acute intracranial abnormality. 2. Old small vessel infarcts of the left thalamus and left cerebellum. 3. Right mastoid effusion.   Allergies: Patient has no known allergies.  Medications: Prior to Admission medications   Medication Sig Start Date End Date Taking? Authorizing Provider  atorvastatin (LIPITOR) 40 MG tablet Take 40 mg by mouth every morning.     [provider]  levothyroxine (SYNTHROID) 50 MCG tablet Take 1 tablet (50 mcg total) by mouth daily before breakfast. 12/26/15   Penland, Kelby Fam, MD  metoprolol tartrate (LOPRESSOR) 25 MG tablet TAKE 1 TABLET TWICE A DAY Patient taking differently: Take 25 mg by mouth 2 (two) times daily.  12/02/13   Evans Lance, MD  ondansetron (ZOFRAN ODT) 4 MG disintegrating tablet 4mg  ODT q4 hours prn nausea/vomit 05/08/20   Milton Ferguson, MD  pantoprazole (PROTONIX) 40 MG tablet Take 1 tablet (40 mg total) by mouth 2 (two) times daily before a meal. Twice a day for 3 months and then start taking daily. 03/29/18   Barton Dubois, MD  XARELTO 15 MG TABS tablet TAKE 1 TABLET BY MOUTH ONCE DAILY WITH SUPPER Patient taking differently: Take 15 mg by mouth daily with supper.  04/25/20   Evans Lance, MD     Vital Signs: BP (!) 147/46 (BP Location: Left Arm)   Pulse 70   Temp 98.8 F (37.1 C) (Axillary)   Resp (!) 29    Ht 5\' 5"  (1.651 m)   Wt 143 lb (64.9 kg)   SpO2 96%   BMI 23.80 kg/m   Physical Exam Vitals and nursing note reviewed.  Constitutional:      General: He is not in acute distress. Pulmonary:     Effort: Pulmonary effort is normal. No respiratory distress.  Skin:    General: Skin is warm and dry.     Comments: Right femoral and right radial puncture sites soft without active bleeding or hematoma.  Neurological:     Mental Status: He is alert.     Comments: Alert, awake, and oriented to person and place. Speech and comprehension intact. PERRL bilaterally. Left facial droop. Tongue midline. Can spontaneously move all extremities with weakness of left side. No pronator drift. Distal pulses (DPs, radial) 1+ bilaterally.     Imaging: CT Code Stroke CTA Head W/WO contrast  Result Date: 10/06/2020 CLINICAL DATA:  Left-sided weakness. EXAM: CT ANGIOGRAPHY HEAD AND NECK CT PERFUSION BRAIN TECHNIQUE: Multidetector CT imaging of the head and neck was performed using the standard protocol during bolus administration of intravenous contrast. Multiplanar CT image reconstructions and MIPs were obtained to evaluate the vascular anatomy. Carotid stenosis measurements (when applicable) are obtained utilizing NASCET criteria, using the distal internal carotid diameter as the denominator. Multiphase CT imaging of the brain was performed following IV bolus contrast injection. Subsequent parametric perfusion maps were calculated using RAPID software. CONTRAST:  129mL OMNIPAQUE IOHEXOL 350 MG/ML SOLN COMPARISON:  Head and neck  CTA 07/02/2020 FINDINGS: CTA NECK FINDINGS Aortic arch: Standard 3 vessel aortic arch with moderate calcified plaque. Similar size of a 3.4 cm outpouching from the transverse aorta in the region of the ductus although its inferior aspect was incompletely covered on today's study. This has a relatively narrow neck and may reflect a pseudoaneurysm. Unchanged kinked appearance of the  proximal to mid left subclavian artery. Limited assessment of a proximal to mid right subclavian artery stenosis due to artifact from motion and calcified plaque. Right carotid system: Patent with predominantly calcified plaque throughout the mid common carotid artery without significant associated stenosis. Extensive calcified and soft plaque at the carotid bifurcation with persistent radiographic string sign of the ICA at its origin. The more distal cervical ICA is patent but mildly small in caliber diffusely consistent with reduced flow. Left carotid system: Patent with suspected moderate stenosis of the common carotid artery origin though with motion artifact limiting precise characterization. Mild calcified plaque in the distal common and proximal internal carotid arteries without significant stenosis. Vertebral arteries: Patent with the right being strongly dominant and the left being diffusely small in caliber. No evidence of dissection or significant stenosis on the right. Limited assessment of the proximal left V1 segment due to motion artifact. Skeleton: Asymmetrically advanced left-sided cervical facet arthrosis with mild multilevel degenerative anterolisthesis. Other neck: Right parotidectomy. Absent or atrophic right submandibular gland. No evidence of cervical lymphadenopathy. Upper chest: Motion artifact in the lung apices. Partially visualized patchy ground-glass opacities bilaterally with a moderate-sized pleural effusion on the right. Review of the MIP images confirms the above findings CTA HEAD FINDINGS Anterior circulation: The internal carotid arteries are patent from skull base to carotid termini with relatively diffuse atherosclerotic plaque resulting in at least mild multifocal stenoses bilaterally with assessment limited by motion artifact today. The MCAs are patent without evidence of a proximal branch occlusion, however there is mild-to-moderate diffuse attenuation of right MCA branch  vessels compared to the left which may reflect reduced flow from the high-grade proximal ICA stenosis. The left A1 segment is again noted to be absent or severely hypoplastic. The right A1 segment is widely patent. The right A2 segment appears dominant with chronic poor visualization of the left ACA. No aneurysm is identified. Posterior circulation: The intracranial vertebral arteries are patent to the basilar. Patent left PICA, right AICA, and bilateral SCA origins are identified. The basilar artery is patent with an unchanged mild stenosis proximally. There is a fetal origin of the right PCA. Both PCAs are patent and diffusely irregular, and there is a severe proximal to mid P2 stenosis on the right. No aneurysm is identified. Venous sinuses: Patent. Anatomic variants: Fetal right PCA.  Diminutive or absent left A1. Review of the MIP images confirms the above findings CT Brain Perfusion Findings: ASPECTS: 10 CBF (<30%) Volume: 0 mL Perfusion (Tmax>6.0s) volume: 312 mL Mismatch Volume: 312 mL Infarction Location: No core infarct identified. Prolonged Tmax throughout essentially the entirety of the right cerebral hemisphere as well as portions of the left ACA territory. IMPRESSION: 1. No emergent large vessel occlusion. 2. Persistent radiographic string sign of the proximal right ICA. 3. Delayed transit throughout the entire right cerebral hemisphere and left ACA territory related to the high-grade proximal right ICA stenosis and chronically diminutive/diseased left ACA. No core infarct identified by CTP. 4. Severe right P2 stenosis. 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 6. Bilateral ground-glass lung opacities which could reflect edema or infection. Moderate right pleural effusion. 7.  Aortic  Atherosclerosis (ICD10-I70.0). These results were called by telephone at the time of interpretation on 10/06/2020 at 6:15 pm to Dr. Roland Rack , who verbally acknowledged these results. Electronically Signed    By: Logan Bores M.D.   On: 10/06/2020 18:54   CT HEAD WO CONTRAST  Result Date: 10/09/2020 CLINICAL DATA:  Stroke follow-up EXAM: CT HEAD WITHOUT CONTRAST TECHNIQUE: Contiguous axial images were obtained from the base of the skull through the vertex without intravenous contrast. COMPARISON:  None. FINDINGS: Brain: There is no mass, hemorrhage or extra-axial collection. There is generalized atrophy without lobar predilection. Hypodensity of the white matter is most commonly associated with chronic microvascular disease. Old small vessel infarcts of the left thalamus and left cerebellum. Vascular: No abnormal hyperdensity of the major intracranial arteries or dural venous sinuses. No intracranial atherosclerosis. Skull: The visualized skull base, calvarium and extracranial soft tissues are normal. Sinuses/Orbits: Right mastoid effusion. Nasopharynx is clear. Paranasal sinuses are unremarkable. The orbits are normal. IMPRESSION: 1. No acute intracranial abnormality. 2. Old small vessel infarcts of the left thalamus and left cerebellum. 3. Right mastoid effusion. Electronically Signed   By: Ulyses Jarred M.D.   On: 10/09/2020 03:09   CT HEAD WO CONTRAST  Result Date: 10/07/2020 CLINICAL DATA:  Stenotic disease of the carotid arteries right worse than left. Assess for potential infarction on the right. EXAM: CT HEAD WITHOUT CONTRAST TECHNIQUE: Contiguous axial images were obtained from the base of the skull through the vertex without intravenous contrast. COMPARISON:  10/06/2020 FINDINGS: Brain: Chronic appearing small vessel ischemic changes of the pons and midbrain. Old cerebellar infarctions on the left. Cerebral hemispheres show an old lacunar infarction in the left thalamus and right caudate head and chronic small-vessel ischemic changes of the cerebral hemispheric white matter. No sign of acute infarction, mass lesion, hemorrhage, hydrocephalus or extra-axial collection. Vascular: There is  atherosclerotic calcification of the major vessels at the base of the brain. Skull: Negative Sinuses/Orbits: Mucosal inflammatory changes of the paranasal sinuses. Orbits negative. Other: None IMPRESSION: No acute finding by CT. Chronic small-vessel ischemic changes throughout the brain as outlined above. Electronically Signed   By: Nelson Chimes M.D.   On: 10/07/2020 06:15   CT Code Stroke CTA Neck W/WO contrast  Result Date: 10/06/2020 CLINICAL DATA:  Left-sided weakness. EXAM: CT ANGIOGRAPHY HEAD AND NECK CT PERFUSION BRAIN TECHNIQUE: Multidetector CT imaging of the head and neck was performed using the standard protocol during bolus administration of intravenous contrast. Multiplanar CT image reconstructions and MIPs were obtained to evaluate the vascular anatomy. Carotid stenosis measurements (when applicable) are obtained utilizing NASCET criteria, using the distal internal carotid diameter as the denominator. Multiphase CT imaging of the brain was performed following IV bolus contrast injection. Subsequent parametric perfusion maps were calculated using RAPID software. CONTRAST:  14mL OMNIPAQUE IOHEXOL 350 MG/ML SOLN COMPARISON:  Head and neck CTA 07/02/2020 FINDINGS: CTA NECK FINDINGS Aortic arch: Standard 3 vessel aortic arch with moderate calcified plaque. Similar size of a 3.4 cm outpouching from the transverse aorta in the region of the ductus although its inferior aspect was incompletely covered on today's study. This has a relatively narrow neck and may reflect a pseudoaneurysm. Unchanged kinked appearance of the proximal to mid left subclavian artery. Limited assessment of a proximal to mid right subclavian artery stenosis due to artifact from motion and calcified plaque. Right carotid system: Patent with predominantly calcified plaque throughout the mid common carotid artery without significant associated stenosis. Extensive calcified and soft plaque  at the carotid bifurcation with persistent  radiographic string sign of the ICA at its origin. The more distal cervical ICA is patent but mildly small in caliber diffusely consistent with reduced flow. Left carotid system: Patent with suspected moderate stenosis of the common carotid artery origin though with motion artifact limiting precise characterization. Mild calcified plaque in the distal common and proximal internal carotid arteries without significant stenosis. Vertebral arteries: Patent with the right being strongly dominant and the left being diffusely small in caliber. No evidence of dissection or significant stenosis on the right. Limited assessment of the proximal left V1 segment due to motion artifact. Skeleton: Asymmetrically advanced left-sided cervical facet arthrosis with mild multilevel degenerative anterolisthesis. Other neck: Right parotidectomy. Absent or atrophic right submandibular gland. No evidence of cervical lymphadenopathy. Upper chest: Motion artifact in the lung apices. Partially visualized patchy ground-glass opacities bilaterally with a moderate-sized pleural effusion on the right. Review of the MIP images confirms the above findings CTA HEAD FINDINGS Anterior circulation: The internal carotid arteries are patent from skull base to carotid termini with relatively diffuse atherosclerotic plaque resulting in at least mild multifocal stenoses bilaterally with assessment limited by motion artifact today. The MCAs are patent without evidence of a proximal branch occlusion, however there is mild-to-moderate diffuse attenuation of right MCA branch vessels compared to the left which may reflect reduced flow from the high-grade proximal ICA stenosis. The left A1 segment is again noted to be absent or severely hypoplastic. The right A1 segment is widely patent. The right A2 segment appears dominant with chronic poor visualization of the left ACA. No aneurysm is identified. Posterior circulation: The intracranial vertebral arteries are  patent to the basilar. Patent left PICA, right AICA, and bilateral SCA origins are identified. The basilar artery is patent with an unchanged mild stenosis proximally. There is a fetal origin of the right PCA. Both PCAs are patent and diffusely irregular, and there is a severe proximal to mid P2 stenosis on the right. No aneurysm is identified. Venous sinuses: Patent. Anatomic variants: Fetal right PCA.  Diminutive or absent left A1. Review of the MIP images confirms the above findings CT Brain Perfusion Findings: ASPECTS: 10 CBF (<30%) Volume: 0 mL Perfusion (Tmax>6.0s) volume: 312 mL Mismatch Volume: 312 mL Infarction Location: No core infarct identified. Prolonged Tmax throughout essentially the entirety of the right cerebral hemisphere as well as portions of the left ACA territory. IMPRESSION: 1. No emergent large vessel occlusion. 2. Persistent radiographic string sign of the proximal right ICA. 3. Delayed transit throughout the entire right cerebral hemisphere and left ACA territory related to the high-grade proximal right ICA stenosis and chronically diminutive/diseased left ACA. No core infarct identified by CTP. 4. Severe right P2 stenosis. 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 6. Bilateral ground-glass lung opacities which could reflect edema or infection. Moderate right pleural effusion. 7.  Aortic Atherosclerosis (ICD10-I70.0). These results were called by telephone at the time of interpretation on 10/06/2020 at 6:15 pm to Dr. Roland Rack , who verbally acknowledged these results. Electronically Signed   By: Logan Bores M.D.   On: 10/06/2020 18:54   CT Code Stroke Cerebral Perfusion with contrast  Result Date: 10/06/2020 CLINICAL DATA:  Left-sided weakness. EXAM: CT ANGIOGRAPHY HEAD AND NECK CT PERFUSION BRAIN TECHNIQUE: Multidetector CT imaging of the head and neck was performed using the standard protocol during bolus administration of intravenous contrast. Multiplanar CT image  reconstructions and MIPs were obtained to evaluate the vascular anatomy. Carotid stenosis measurements (when  applicable) are obtained utilizing NASCET criteria, using the distal internal carotid diameter as the denominator. Multiphase CT imaging of the brain was performed following IV bolus contrast injection. Subsequent parametric perfusion maps were calculated using RAPID software. CONTRAST:  167mL OMNIPAQUE IOHEXOL 350 MG/ML SOLN COMPARISON:  Head and neck CTA 07/02/2020 FINDINGS: CTA NECK FINDINGS Aortic arch: Standard 3 vessel aortic arch with moderate calcified plaque. Similar size of a 3.4 cm outpouching from the transverse aorta in the region of the ductus although its inferior aspect was incompletely covered on today's study. This has a relatively narrow neck and may reflect a pseudoaneurysm. Unchanged kinked appearance of the proximal to mid left subclavian artery. Limited assessment of a proximal to mid right subclavian artery stenosis due to artifact from motion and calcified plaque. Right carotid system: Patent with predominantly calcified plaque throughout the mid common carotid artery without significant associated stenosis. Extensive calcified and soft plaque at the carotid bifurcation with persistent radiographic string sign of the ICA at its origin. The more distal cervical ICA is patent but mildly small in caliber diffusely consistent with reduced flow. Left carotid system: Patent with suspected moderate stenosis of the common carotid artery origin though with motion artifact limiting precise characterization. Mild calcified plaque in the distal common and proximal internal carotid arteries without significant stenosis. Vertebral arteries: Patent with the right being strongly dominant and the left being diffusely small in caliber. No evidence of dissection or significant stenosis on the right. Limited assessment of the proximal left V1 segment due to motion artifact. Skeleton: Asymmetrically  advanced left-sided cervical facet arthrosis with mild multilevel degenerative anterolisthesis. Other neck: Right parotidectomy. Absent or atrophic right submandibular gland. No evidence of cervical lymphadenopathy. Upper chest: Motion artifact in the lung apices. Partially visualized patchy ground-glass opacities bilaterally with a moderate-sized pleural effusion on the right. Review of the MIP images confirms the above findings CTA HEAD FINDINGS Anterior circulation: The internal carotid arteries are patent from skull base to carotid termini with relatively diffuse atherosclerotic plaque resulting in at least mild multifocal stenoses bilaterally with assessment limited by motion artifact today. The MCAs are patent without evidence of a proximal branch occlusion, however there is mild-to-moderate diffuse attenuation of right MCA branch vessels compared to the left which may reflect reduced flow from the high-grade proximal ICA stenosis. The left A1 segment is again noted to be absent or severely hypoplastic. The right A1 segment is widely patent. The right A2 segment appears dominant with chronic poor visualization of the left ACA. No aneurysm is identified. Posterior circulation: The intracranial vertebral arteries are patent to the basilar. Patent left PICA, right AICA, and bilateral SCA origins are identified. The basilar artery is patent with an unchanged mild stenosis proximally. There is a fetal origin of the right PCA. Both PCAs are patent and diffusely irregular, and there is a severe proximal to mid P2 stenosis on the right. No aneurysm is identified. Venous sinuses: Patent. Anatomic variants: Fetal right PCA.  Diminutive or absent left A1. Review of the MIP images confirms the above findings CT Brain Perfusion Findings: ASPECTS: 10 CBF (<30%) Volume: 0 mL Perfusion (Tmax>6.0s) volume: 312 mL Mismatch Volume: 312 mL Infarction Location: No core infarct identified. Prolonged Tmax throughout essentially the  entirety of the right cerebral hemisphere as well as portions of the left ACA territory. IMPRESSION: 1. No emergent large vessel occlusion. 2. Persistent radiographic string sign of the proximal right ICA. 3. Delayed transit throughout the entire right cerebral hemisphere and left  ACA territory related to the high-grade proximal right ICA stenosis and chronically diminutive/diseased left ACA. No core infarct identified by CTP. 4. Severe right P2 stenosis. 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 6. Bilateral ground-glass lung opacities which could reflect edema or infection. Moderate right pleural effusion. 7.  Aortic Atherosclerosis (ICD10-I70.0). These results were called by telephone at the time of interpretation on 10/06/2020 at 6:15 pm to Dr. Roland Rack , who verbally acknowledged these results. Electronically Signed   By: Logan Bores M.D.   On: 10/06/2020 18:54   DG Chest Port 1 View  Result Date: 10/09/2020 CLINICAL DATA:  Congestive heart failure EXAM: PORTABLE CHEST 1 VIEW COMPARISON:  10/08/2020 FINDINGS: Lung volumes are small, but are symmetric and are stable since prior examination. Mild interstitial pulmonary infiltrate has improved in the interval since prior examination, though persists. Tiny bilateral pleural effusions are present, decreased in size since prior examination. No pneumothorax. Mild to moderate cardiomegaly is unchanged. Left subclavian dual lead pacemaker is unchanged. No acute bone abnormality. IMPRESSION: Stable pulmonary hypoinflation. Improving cardiogenic failure. Stable mild-to-moderate cardiomegaly. Electronically Signed   By: Fidela Salisbury MD   On: 10/09/2020 06:21   DG Chest Port 1 View  Result Date: 10/08/2020 CLINICAL DATA:  Shortness of breath EXAM: PORTABLE CHEST 1 VIEW COMPARISON:  October 06, 2020 FINDINGS: There is cardiomegaly with pulmonary venous hypertension. Pacemaker leads are attached to the right atrium and right ventricle. There are  small pleural effusions bilaterally with mild interstitial edema. No consolidation. There is aortic atherosclerosis. No adenopathy. No bone lesions. IMPRESSION: Cardiomegaly with pulmonary vascular congestion. Small pleural effusions bilaterally with mild interstitial edema. Suspect a degree of underlying congestive heart failure. Pacemaker leads attached to right atrium and right ventricle. Aortic Atherosclerosis (ICD10-I70.0). Electronically Signed   By: Lowella Grip III M.D.   On: 10/08/2020 09:57   DG Chest Port 1 View  Result Date: 10/06/2020 CLINICAL DATA:  Shortness of breath, back pain since this morning EXAM: PORTABLE CHEST 1 VIEW COMPARISON:  Portable exam 1207 hours compared to 10/02/2012 FINDINGS: LEFT subclavian sequential transvenous pacemaker leads project at RIGHT atrium and RIGHT ventricle. Enlargement of cardiac silhouette. Mediastinal contours and pulmonary vascularity normal. Atherosclerotic calcification aorta. Slight accentuation of interstitial markings in the mid to lower lungs increased from previous exam, question minimal pulmonary edema or developing chronic interstitial lung disease. No pleural effusion or pneumothorax. Bones demineralized with BILATERAL glenohumeral degenerative changes. IMPRESSION: Enlargement of cardiac silhouette with pulmonary vascular congestion. Increased interstitial markings in the mid to lower lungs question minimal edema or developing chronic interstitial lung disease. Electronically Signed   By: Lavonia Dana M.D.   On: 10/06/2020 12:23   ECHOCARDIOGRAM COMPLETE  Result Date: 10/07/2020    ECHOCARDIOGRAM REPORT   Patient Name:   Fernando [name] Date of Exam: 10/07/2020 Medical Rec #:  [id number]      Height:       65.0 in Accession #:    [id number]     Weight:       143.0 lb Date of Birth:  1927-02-23     BSA:          1.715 m Patient Age:    86 years       BP:           119/82 mmHg Patient Gender: M              HR:           111 bpm. Exam  Location:  Inpatient Procedure: 2D Echo, Intracardiac Opacification Agent, Color Doppler and Cardiac            Doppler Indications:    Stroke  History:        Patient has prior history of Echocardiogram examinations, most                 recent 07/03/2020. CAD, Pacemaker, Arrythmias:Atrial Fibrillation;                 Risk Factors:Dyslipidemia, Hypertension and Former Smoker.                 Tachy-Brady syndrome.  Sonographer:    Clayton Lefort RDCS (AE) Referring Phys: [id number] [place]  1. Akinesis of the distal septum and apex; overall mildly reduced LV function; no thrombus noted using definity.  2. Left ventricular ejection fraction, by estimation, is 40 to 45%. The left ventricle has mildly decreased function. The left ventricle demonstrates regional wall motion abnormalities (see scoring diagram/findings for description). Left ventricular diastolic function could not be evaluated.  3. Right ventricular systolic function is normal. The right ventricular size is normal.  4. Left atrial size was severely dilated.  5. Right atrial size was severely dilated.  6. The mitral valve is normal in structure. Mild mitral valve regurgitation. No evidence of mitral stenosis.  7. Tricuspid valve regurgitation is moderate.  8. The aortic valve is tricuspid. Aortic valve regurgitation is mild. Mild aortic valve stenosis.  9. The inferior vena cava is normal in size with greater than 50% respiratory variability, suggesting right atrial pressure of 3 mmHg. FINDINGS  Left Ventricle: Left ventricular ejection fraction, by estimation, is 40 to 45%. The left ventricle has mildly decreased function. The left ventricle demonstrates regional wall motion abnormalities. Definity contrast agent was given IV to delineate the left ventricular endocardial borders. The left ventricular internal cavity size was normal in size. There is no left ventricular hypertrophy. Left ventricular diastolic function could not be evaluated due to  atrial fibrillation. Left ventricular diastolic function could not be evaluated. Right Ventricle: The right ventricular size is normal. Right ventricular systolic function is normal. Left Atrium: Left atrial size was severely dilated. Right Atrium: Right atrial size was severely dilated. Pericardium: Trivial pericardial effusion is present. Mitral Valve: The mitral valve is normal in structure. Mild mitral annular calcification. Mild mitral valve regurgitation. No evidence of mitral valve stenosis. Tricuspid Valve: The tricuspid valve is normal in structure. Tricuspid valve regurgitation is moderate . No evidence of tricuspid stenosis. Aortic Valve: The aortic valve is tricuspid. Aortic valve regurgitation is mild. Aortic regurgitation PHT measures 292 msec. Mild aortic stenosis is present. Aortic valve mean gradient measures 10.5 mmHg. Aortic valve peak gradient measures 21.6 mmHg. Aortic valve area, by VTI measures 1.46 cm. Pulmonic Valve: The pulmonic valve was not well visualized. Pulmonic valve regurgitation is not visualized. No evidence of pulmonic stenosis. Aorta: The aortic root is normal in size and structure. Venous: The inferior vena cava is normal in size with greater than 50% respiratory variability, suggesting right atrial pressure of 3 mmHg. IAS/Shunts: No atrial level shunt detected by color flow Doppler. Additional Comments: Akinesis of the distal septum and apex; overall mildly reduced LV function; no thrombus noted using definity. A pacer wire is visualized.  LEFT VENTRICLE PLAX 2D LVOT diam:     2.00 cm LV SV:         49 LV SV Index:   29 LVOT Area:  3.14 cm  RIGHT VENTRICLE            IVC RV Basal diam:  3.30 cm    IVC diam: 2.10 cm RV S prime:     9.46 cm/s TAPSE (M-mode): 1.3 cm LEFT ATRIUM            Index       RIGHT ATRIUM           Index LA Vol (A2C): 81.4 ml  47.46 ml/m RA Area:     31.90 cm LA Vol (A4C): 102.0 ml 59.47 ml/m RA Volume:   109.00 ml 63.55 ml/m  AORTIC VALVE AV  Area (Vmax):    1.42 cm AV Area (Vmean):   1.28 cm AV Area (VTI):     1.46 cm AV Vmax:           232.50 cm/s AV Vmean:          147.500 cm/s AV VTI:            0.335 m AV Peak Grad:      21.6 mmHg AV Mean Grad:      10.5 mmHg LVOT Vmax:         105.00 cm/s LVOT Vmean:        60.100 cm/s LVOT VTI:          0.156 m LVOT/AV VTI ratio: 0.47 AI PHT:            292 msec  AORTA Ao Root diam: 3.50 cm Ao Asc diam:  3.00 cm TRICUSPID VALVE TR Peak grad:   57.8 mmHg TR Vmax:        380.00 cm/s  SHUNTS Systemic VTI:  0.16 m Systemic Diam: 2.00 cm Kirk Ruths MD Electronically signed by Kirk Ruths MD Signature Date/Time: 10/07/2020/4:08:38 PM    Final    CT HEAD CODE STROKE WO CONTRAST  Result Date: 10/06/2020 CLINICAL DATA:  Code stroke. 86 year old male with new onset left weakness. EXAM: CT HEAD WITHOUT CONTRAST TECHNIQUE: Contiguous axial images were obtained from the base of the skull through the vertex without intravenous contrast. COMPARISON:  CT head 07/02/2020. FINDINGS: Brain: No midline shift, mass effect, or evidence of intracranial mass lesion. No acute intracranial hemorrhage identified. No ventriculomegaly. Stable gray-white matter differentiation throughout the brain. No acute cortically based infarct identified. Chronic left cerebellar infarct. Mild chronic encephalomalacia in the right occipital pole. Chronic lacunar infarct left thalamus. Vascular: Extensive Calcified atherosclerosis at the skull base. New hyperdense right MCA (series 4, image 28). Skull: No acute osseous abnormality identified. Sinuses/Orbits: Stable paranasal sinuses and mastoids, chronic right mastoid effusion. Other: Rightward gaze deviation is new. Stable scalp. ASPECTS [facility] Stroke Program Early CT Score) Total score (0-10 with 10 being normal): 10 IMPRESSION: 1. Hyperdense Right MCA suspicious for Emergent Large Vessel Occlusion. 2. No acute cortically based infarct or acute intracranial hemorrhage identified. ASPECTS  10. 3. Stable CT appearance of brain parenchyma since August. 4. Study discussed by telephone with Dr. Vonna Kotyk LONG on 10/06/2020 at 17:37. He advised that clinically the patient does not have fix right gaze, and that the presentation is more suspicious of sepsis than large vessel occlusion. Electronically Signed   By: Genevie Ann M.D.   On: 10/06/2020 17:40    Labs:  CBC: Recent Labs    07/02/20 1845 07/02/20 1845 10/06/20 1042 10/06/20 1042 10/06/20 1738 10/07/20 0242 10/07/20 0539 10/08/20 0225  WBC 6.4  --  17.1*  --   --   --  17.1* 17.7*  HGB 9.9*   < > 9.1*   < > 9.5* 8.8* 7.9* 8.2*  HCT 31.4*   < > 30.1*   < > 28.0* 26.0* 25.6* 27.5*  PLT 257  --  242  --   --   --  196 228   < > = values in this interval not displayed.    COAGS: Recent Labs    07/02/20 1845 10/06/20 1727  INR 1.5* 1.5*  APTT 42* 42*    BMP: Recent Labs    05/08/20 1603 07/02/20 1842 07/02/20 1845 07/02/20 1845 10/06/20 1042 10/06/20 1042 10/06/20 1738 10/07/20 0242 10/07/20 0539 10/08/20 0225  NA 132*   < > 132*   < > 138   < > 142 145 140 146*  K 4.6   < > 3.9   < > 3.8   < > 3.7 3.7 3.2* 4.3  CL 99   < > 102   < > 102  --  109  --  114* 118*  CO2 23  --  20*  --  20*  --   --   --  16* 16*  GLUCOSE 156*   < > 143*   < > 172*  --  145*  --  110* 108*  BUN 34*   < > 34*   < > 37*  --  32*  --  32* 34*  CALCIUM 9.1  --  8.7*  --  9.1  --   --   --  7.4* 7.9*  CREATININE 1.35*   < > 1.57*   < > 1.49*  --  1.30*  --  1.23 1.48*  GFRNONAA 46*  --  38*  --  44*  --   --   --  55* 44*  GFRAA 53*  --  44*  --   --   --   --   --   --   --    < > = values in this interval not displayed.    LIVER FUNCTION TESTS: Recent Labs    05/08/20 1603 07/02/20 1845 10/06/20 1042  BILITOT 0.9 0.7 1.0  AST 21 20 29   ALT 13 13 18   ALKPHOS 55 86 62  PROT 8.0 7.2 7.8  ALBUMIN 4.2 3.6 3.7    Assessment and Plan:  History of acute CVA s/p cerebral arteriogram with emergent revascularization of right  ICA stenosis using balloon angioplasty via right femoral/radial approach 10/06/2020 by Dr. Earleen Newport. Patient's condition stable- oriented to person and place, left facial droop, can spontaneously move all extremities with weakness of left side. Right femoral and right radial puncture sites stable, distal pulses (DPs, radials) 1+ bilaterally. Continue taking Aspirin 300 PR once daily. Plan to follow-up with carotid US followed by clinic visit with Dr. Aleen Campi 3 months after discharge ([facility] schedulers to call patient to set up this appointment. Further plans per neurology/CCM/cardiology- appreciate and agree with management. Please call [facility] with questions/concerns.   Electronically Signed: Earley Abide, PA-C 10/09/2020, 9:14 AM   I spent a total of 25 Minutes at the the patient's bedside AND on the patient's hospital floor or unit, greater than 50% of which was counseling/coordinating care for CVA s/p revascularization.

## 2020-10-09 NOTE — Progress Notes (Signed)
Physical Therapy Treatment Patient Details Name: Fernando [name] MRN: [id number] DOB: 1927/10/31 Today's Date: 10/09/2020    History of Present Illness The pt is a 86 yo male presenting with L sided weakness, aphasia, and AMS. Upon work-up, found to have large R MCA stenosis and is now s/p stenting. Pt intubated for procedure 11/12 and extubated 11/13. PMH includes: a fib on Xarelto, CAD, HTN, HLD, pacemaker 10/2012.    PT Comments    The pt is making progress with mobility and PT goals at this time. He was able to complete bed mobility with less assist, and complete x3 sit-stand from EOB with a RW for UE support. The pt was able to eventually come to standing position with maxA, but requires significant increased time to rise and max cues/facilitation for hip extension. The pt will continue to benefit from skilled PT to progress OOB mobility, endurance, power, strength, and stability. Continue to recommend CIR.     Follow Up Recommendations  CIR     Equipment Recommendations   (defer to post acute)    Recommendations for Other Services       Precautions / Restrictions Precautions Precautions: Fall Precaution Comments: rectal tube Restrictions Weight Bearing Restrictions: No    Mobility  Bed Mobility Overal bed mobility: Needs Assistance Bed Mobility: Supine to Sit     Supine to sit: HOB elevated;Mod assist     General bed mobility comments: minA with increased time to move BLE to EOB, then modA to raise trunk from [facility]  Transfers Overall transfer level: Needs assistance Equipment used: Rolling walker (2 wheeled) Transfers: Sit to/from Stand Sit to Stand: Max assist;+2 physical assistance;+2 safety/equipment         General transfer comment: maxA to stand with RW for UE support, pt with strong posterior lean and poor hip extension.   Ambulation/Gait             General Gait Details: pt with significant fatigue, unable to initiate stepping       Balance  Overall balance assessment: Needs assistance Sitting-balance support: Single extremity supported;Feet supported Sitting balance-Leahy Scale: Poor Sitting balance - Comments: reliant on UE support and mod/maxA of 1 Postural control: Left lateral lean;Posterior lean Standing balance support: Bilateral upper extremity supported Standing balance-Leahy Scale: Zero Standing balance comment: reliant on maxA of 2 to maintain static stance                            Cognition Arousal/Alertness: Awake/alert Behavior During Therapy: WFL for tasks assessed/performed Overall Cognitive Status: Impaired/Different from baseline Area of Impairment: Following commands;Safety/judgement;Problem solving                       Following Commands: Follows one step commands inconsistently;Follows one step commands with increased time Safety/Judgement: Decreased awareness of safety;Decreased awareness of deficits   Problem Solving: Slow processing;Requires verbal cues;Requires tactile cues General Comments: Increased time with commands, but following majority of cues and commands with R extremities      Exercises      General Comments General comments (skin integrity, edema, etc.): VSS through session      Pertinent Vitals/Pain Pain Assessment: No/denies pain Faces Pain Scale: No hurt Pain Intervention(s): Limited activity within patient's tolerance;Monitored during session           PT Goals (current goals can now be found in the care plan section) Acute Rehab PT Goals Patient Stated Goal: stand up  PT Goal Formulation: With patient Time For Goal Achievement: 10/21/20 Potential to Achieve Goals: Good Progress towards PT goals: Progressing toward goals    Frequency    Min 4X/week      PT Plan Current plan remains appropriate       AM-PAC PT "6 Clicks" Mobility   Outcome Measure  Help needed turning from your back to your side while in a flat bed without using  bedrails?: A Little Help needed moving from lying on your back to sitting on the side of a flat bed without using bedrails?: A Lot Help needed moving to and from a bed to a chair (including a wheelchair)?: Total Help needed standing up from a chair using your arms (e.g., wheelchair or bedside chair)?: A Lot Help needed to walk in hospital room?: Total Help needed climbing 3-5 steps with a railing? : Total 6 Click Score: 10    End of Session Equipment Utilized During Treatment: Gait belt Activity Tolerance: Patient tolerated treatment well;Patient limited by fatigue Patient left: with call bell/phone within reach;in bed;with family/visitor present Nurse Communication: Mobility status PT Visit Diagnosis: Unsteadiness on feet (R26.81);Muscle weakness (generalized) (M62.81);Other abnormalities of gait and mobility (R26.89);Hemiplegia and hemiparesis Hemiplegia - Right/Left: Left Hemiplegia - dominant/non-dominant: Non-dominant Hemiplegia - caused by: Other cerebrovascular disease (R MCA stenosis)     Time: 0277-4128 PT Time Calculation (min) (ACUTE ONLY): 34 min  Charges:  $Gait Training: 8-22 mins $Therapeutic Activity: 8-22 mins                     Karma Ganja, PT, DPT   Acute Rehabilitation Department Pager #: [phone]   Otho Bellows 10/09/2020, 5:41 PM

## 2020-10-10 ENCOUNTER — Encounter (HOSPITAL_COMMUNITY): Payer: Self-pay | Admitting: Neurology

## 2020-10-10 ENCOUNTER — Encounter (HOSPITAL_COMMUNITY)
Admission: EM | Disposition: A | Discharge status: Rehabilitation Facility | Payer: Self-pay | Source: Home / Self Care | Attending: Neurology

## 2020-10-10 ENCOUNTER — Inpatient Hospital Stay (HOSPITAL_COMMUNITY): Payer: PPO | Admitting: Certified Registered"

## 2020-10-10 ENCOUNTER — Inpatient Hospital Stay (HOSPITAL_COMMUNITY): Payer: PPO

## 2020-10-10 DIAGNOSIS — R7881 Bacteremia: Secondary | ICD-10-CM

## 2020-10-10 DIAGNOSIS — I361 Nonrheumatic tricuspid (valve) insufficiency: Secondary | ICD-10-CM

## 2020-10-10 DIAGNOSIS — R0602 Shortness of breath: Secondary | ICD-10-CM

## 2020-10-10 DIAGNOSIS — A419 Sepsis, unspecified organism: Secondary | ICD-10-CM | POA: Diagnosis not present

## 2020-10-10 DIAGNOSIS — I340 Nonrheumatic mitral (valve) insufficiency: Secondary | ICD-10-CM | POA: Diagnosis not present

## 2020-10-10 DIAGNOSIS — I4821 Permanent atrial fibrillation: Secondary | ICD-10-CM | POA: Diagnosis not present

## 2020-10-10 DIAGNOSIS — I2510 Atherosclerotic heart disease of native coronary artery without angina pectoris: Secondary | ICD-10-CM | POA: Diagnosis not present

## 2020-10-10 HISTORY — PX: TEE WITHOUT CARDIOVERSION: SHX5443

## 2020-10-10 LAB — BASIC METABOLIC PANEL
Anion gap: 9 (ref 5–15)
Anion gap: 11 (ref 5–15)
BUN: 44 mg/dL — ABNORMAL HIGH (ref 8–23)
BUN: 39 mg/dL — ABNORMAL HIGH (ref 8–23)
CO2: 18 mmol/L — ABNORMAL LOW (ref 22–32)
CO2: 18 mmol/L — ABNORMAL LOW (ref 22–32)
Calcium: 7.9 mg/dL — ABNORMAL LOW (ref 8.9–10.3)
Calcium: 8.1 mg/dL — ABNORMAL LOW (ref 8.9–10.3)
Chloride: 119 mmol/L — ABNORMAL HIGH (ref 98–111)
Chloride: 117 mmol/L — ABNORMAL HIGH (ref 98–111)
Creatinine, Ser: 1.13 mg/dL (ref 0.61–1.24)
Creatinine, Ser: 1.04 mg/dL (ref 0.61–1.24)
GFR, Estimated: >60 mL/min (ref >60)
GFR, Estimated: >60 mL/min (ref >60)
Glucose, Bld: 115 mg/dL — ABNORMAL HIGH (ref 70–99)
Glucose, Bld: 104 mg/dL — ABNORMAL HIGH (ref 70–99)
Potassium: 2.9 mmol/L — ABNORMAL LOW (ref 3.5–5.1)
Potassium: 3.3 mmol/L — ABNORMAL LOW (ref 3.5–5.1)
Sodium: 146 mmol/L — ABNORMAL HIGH (ref 135–145)
Sodium: 146 mmol/L — ABNORMAL HIGH (ref 135–145)

## 2020-10-10 LAB — GLUCOSE, CAPILLARY
Glucose-Capillary: 119 mg/dL — ABNORMAL HIGH (ref 70–99)
Glucose-Capillary: 107 mg/dL — ABNORMAL HIGH (ref 70–99)
Glucose-Capillary: 99 mg/dL (ref 70–99)
Glucose-Capillary: 104 mg/dL — ABNORMAL HIGH (ref 70–99)
Glucose-Capillary: 87 mg/dL (ref 70–99)
Glucose-Capillary: 90 mg/dL (ref 70–99)

## 2020-10-10 LAB — MAGNESIUM: Magnesium: 2.2 mg/dL (ref 1.7–2.4)

## 2020-10-10 SURGERY — ECHOCARDIOGRAM, TRANSESOPHAGEAL
Anesthesia: Monitor Anesthesia Care

## 2020-10-10 MED ORDER — LOPERAMIDE HCL 2 MG PO CAPS
2.0000 mg | ORAL_CAPSULE | Freq: Once | ORAL | Status: AC
  Administered 2020-10-10: 2 mg via ORAL
  Filled 2020-10-10: qty 1

## 2020-10-10 MED ORDER — METOPROLOL SUCCINATE ER 25 MG PO TB24
25.0000 mg | ORAL_TABLET | Freq: Every day | ORAL | Status: DC
  Administered 2020-10-13 – 2020-10-14 (×2): 25 mg via ORAL
  Filled 2020-10-10 (×3): qty 1

## 2020-10-10 MED ORDER — PROPOFOL 10 MG/ML IV BOLUS
INTRAVENOUS | Status: DC | PRN
  Administered 2020-10-10: 20 mg via INTRAVENOUS
  Administered 2020-10-10: 10 mg via INTRAVENOUS

## 2020-10-10 MED ORDER — POTASSIUM CHLORIDE 10 MEQ/100ML IV SOLN
10.0000 meq | INTRAVENOUS | Status: AC
  Administered 2020-10-10 (×4): 10 meq via INTRAVENOUS
  Filled 2020-10-10 (×4): qty 100

## 2020-10-10 MED ORDER — POTASSIUM CHLORIDE CRYS ER 20 MEQ PO TBCR
40.0000 meq | EXTENDED_RELEASE_TABLET | Freq: Once | ORAL | Status: DC
  Filled 2020-10-10: qty 2

## 2020-10-10 MED ORDER — PROPOFOL 500 MG/50ML IV EMUL
INTRAVENOUS | Status: DC | PRN
  Administered 2020-10-10: 80 ug/kg/min via INTRAVENOUS

## 2020-10-10 MED ORDER — SODIUM CHLORIDE 0.9 % IV SOLN: INTRAVENOUS | Status: DC

## 2020-10-10 NOTE — Progress Notes (Signed)
NAME:  Fernando [name], MRN:  [id number], DOB:  [birth year], LOS: 4 ADMISSION DATE:  10/06/2020, CONSULTATION DATE: 10/07/2020 REFERRING MD:  Dr. Leonel Ramsay, Neurology, CHIEF COMPLAINT:  Stroke   Brief History   86 yo male former smoker presented with Lt sided weakness, aphasia, and altered mental status.  Found to have large Rt MCA stenosis and had stenting.  Required intubation for procedure.  Noted to have fever 101.23F and found to have Streptococcus agalactiae bacteremia.  Past Medical History  A fib on xarelto, carotid artery disease s/p Lt CEA, CAD, Squamous cell carcinoma of parotic, tachy-brady syndrome s/p PM, HTN, HLD, Hypothyroidism  Significant Hospital Events   11/12 Admit, cerebral angiogram with angioplasty 11/13 extubated; neuro spoke with family >> DNR, continue other medical care  Consults:  Cardiology   Procedures:  ETT 11/12 >>11/13  Significant Diagnostic Tests:   CT head 11/12 >> hyperdense Rt MCA, chronic Lt cerebellar infarct, mild chronic encephalomalacia Rt occipital pole, chronic lacunar infarct Lt thalamus  CT angio head/neck 11/12 >> string sign of proximal Rt ICA, high grade proxomal Rt ICA stenosis, severe Rt P2 stenosis  Echo 11/13 >> EF 40 to 45%, severely dilated LA/RA, mild MR, mod TR, mild AR and AS  CXR 11/15 >> improving pulmonary edema  Micro Data:  COVID/Flu 11/12 >> negative Blood 11/12 >> Streptococcus agalactiae  MRSA PCR 11/13 >> negative Sputum 11/13 >>   Antimicrobials:  Zithromax 11/12 Cefepime 11/12 Flagyl 11/12  Vancomycin 11/12  Rocephin 11/12 >>   Interim history/subjective:  Denies dyspnea or chest pain. Eager to eat.   Objective   Blood pressure (!) 148/92, pulse 77, temperature 98.5 F (36.9 C), temperature source Oral, resp. rate (!) 29, height 5\' 5"  (1.651 m), weight 64.9 kg, SpO2 97 %.        Intake/Output Summary (Last 24 hours) at 10/10/2020 1038 Last data filed at 10/10/2020 0800 Gross per 24 hour    Intake 1684.79 ml  Output 150 ml  Net 1534.79 ml   Filed Weights   10/06/20 1300  Weight: 64.9 kg    Examination:  General - alert, answers appropriately to questions.  Eyes - pupils reactive, no scleral icterus.  ENT - edentulous, dry furrowed tongue.  Cardiac - irregular rhythm, HS normal, no murmur, no JVD Chest - clear at bases.  Abdomen - soft, non tender, + bowel sounds Extremities - no cyanosis, clubbing, or edema Skin - no rashes Neuro - 4+/5 strength on the right 3/5 on left.  Psych - normal mood and behavior   Resolved Hospital Problem list   Community acquired pneumonia ruled out, Lactic acidosis, Compromised airway in setting of CVA  Assessment & Plan:    Acute hypoxic respiratory insufficiency from acute pulmonary edema and Rt pleural effusion. - goal SpO2 > 92% - appears euvolemic today,.   Streptococcal agalactiae bacteremia. - day 3 of ABx - for TEE today to establish duration of therapy  Acute CVA with Rt MCA stenosis s/p angioplasty. - continue ASA - Modified barium to assess for dysphagia.  - Will likely need a Cortrak for the short term.   New onset acute systolic CHF. Persistent A fib, HTN, HLD. - continue lipitor once able to swallow pills - on xarelto as outpt; resume anticoagulation when okay with neurology - hold outpt lopressor for now  Hx of hypothyroidism. - resume synthroid when able to swallow pills  Hyperglycemia. - SSI  Hypokalemia. - f/u BMET  Anemia of critical illness. - f/u CBC -  transfuse for Hb < 7 or significant bleeding  Best practice:  Diet: NPO, ice chips DVT prophylaxis: Lovenox GI prophylaxis: Not indicated Mobility: PT/OT >> recommending CIR Code Status: DNR Disposition: to floor if tolerates TEE.  Family communication: will update family after TEE.   Labs    CMP Latest Ref Rng & Units 10/10/2020 10/09/2020 10/08/2020  Glucose 70 - 99 mg/dL 115(H) 118(H) 108(H)  BUN 8 - 23 mg/dL 44(H) 41(H) 34(H)   Creatinine 0.61 - 1.24 mg/dL 1.13 1.35(H) 1.48(H)  Sodium 135 - 145 mmol/L 146(H) 150(H) 146(H)  Potassium 3.5 - 5.1 mmol/L 2.9(L) 3.3(L) 4.3  Chloride 98 - 111 mmol/L 119(H) 118(H) 118(H)  CO2 22 - 32 mmol/L 18(L) 17(L) 16(L)  Calcium 8.9 - 10.3 mg/dL 7.9(L) 8.4(L) 7.9(L)  Total Protein 6.5 - 8.1 g/dL - - -  Total Bilirubin 0.3 - 1.2 mg/dL - - -  Alkaline Phos 38 - 126 U/L - - -  AST 15 - 41 U/L - - -  ALT 0 - 44 U/L - - -    CBC Latest Ref Rng & Units 10/09/2020 10/08/2020 10/07/2020  WBC 4.0 - 10.5 K/uL 15.2(H) 17.7(H) 17.1(H)  Hemoglobin 13.0 - 17.0 g/dL 8.8(L) 8.2(L) 7.9(L)  Hematocrit 39 - 52 % 29.0(L) 27.5(L) 25.6(L)  Platelets 150 - 400 K/uL 262 228 196    ABG    Component Value Date/Time   PHART 7.339 (L) 10/07/2020 0242   PCO2ART 33.4 10/07/2020 0242   PO2ART 280 (H) 10/07/2020 0242   HCO3 18.3 (L) 10/07/2020 0242   TCO2 19 (L) 10/07/2020 0242   ACIDBASEDEF 7.0 (H) 10/07/2020 0242   O2SAT 100.0 10/07/2020 0242    CBG (last 3)  Recent Labs    10/09/20 2330 10/10/20 0328 10/10/20 0827  GLUCAP Cant[place], MD [facility] ICU Physician [place]  Pager: [phone] Mobile: [phone] After hours: [phone].  10/10/2020, 10:38 AM

## 2020-10-10 NOTE — Progress Notes (Signed)
SLP Cancellation Note  Patient Details Name: Fernando [name] MRN: [id number] DOB: [birth year]   Cancelled treatment:       Reason Eval/Treat Not Completed: Patient at procedure or test/unavailable. SLP will f/u at the next date.    Greggory Keen 10/10/2020, 12:47 PM

## 2020-10-10 NOTE — H&P (View-Only) (Signed)
Progress Note  Patient Name: Fernando [name] Date of Encounter: 10/10/2020  [facility] Cardiologist: No primary care provider on file.   Subjective   Feels well and sitting up in chair. No chest pain. Gets very SOB with minimal exertion.  Inpatient Medications    Scheduled Meds: . aspirin EC  325 mg Oral Daily  . atorvastatin  20 mg Oral Daily  . Chlorhexidine Gluconate Cloth  6 each Topical Daily  . enoxaparin (LOVENOX) injection  30 mg Subcutaneous Q24H  . insulin aspart  0-9 Units Subcutaneous Q4H  . levothyroxine  50 mcg Oral Q0600  . loperamide  2 mg Oral Once  . mouth rinse  15 mL Mouth Rinse BID  . potassium chloride  40 mEq Oral Once   Continuous Infusions: . sodium chloride Stopped (10/10/20 0501)  . sodium chloride    . cefTRIAXone (ROCEPHIN)  IV Stopped (10/10/20 0116)  . norepinephrine (LEVOPHED) Adult infusion Stopped (10/10/20 0008)   PRN Meds: acetaminophen **OR** acetaminophen   Vital Signs    Vitals:   10/10/20 0000 10/10/20 0326 10/10/20 0700 10/10/20 0800  BP: (!) 148/65  124/74 (!) 148/92  Pulse: 64  74 77  Resp: (!) 32  (!) 21 (!) 29  Temp:  100 F (37.8 C)  98.5 F (36.9 C)  TempSrc:  Oral  Oral  SpO2: 90%  95% 97%  Weight:      Height:        Intake/Output Summary (Last 24 hours) at 10/10/2020 1141 Last data filed at 10/10/2020 0800 Gross per 24 hour  Intake 1684.79 ml  Output 150 ml  Net 1534.79 ml   Last 3 Weights 10/06/2020 07/02/2020 05/08/2020  Weight (lbs) 143 lb 143 lb 14.4 oz 130 lb  Weight (kg) 64.864 kg 65.273 kg 58.968 kg      Telemetry    V-paced - Personally Reviewed  ECG    No new ECG - Personally Reviewed  Physical Exam   GEN: No acute distress. Sitting up comfortably in chair   Neck: No JVD Cardiac: Irregular, no murmur Respiratory: Diminished at bases. GI: Soft, nontender, non-distended  MS: Warm, trace edema Neuro:  Left sided weakness Psych: Normal affect   Labs    High Sensitivity  Troponin:  No results for input(s): TROPONINIHS in the last 720 hours.    Chemistry Recent Labs  Lab 10/06/20 1042 10/06/20 1738 10/08/20 0225 10/09/20 1112 10/10/20 0340  NA 138   < > 146* 150* 146*  K 3.8   < > 4.3 3.3* 2.9*  CL 102   < > 118* 118* 119*  CO2 20*   < > 16* 17* 18*  GLUCOSE 172*   < > 108* 118* 115*  BUN 37*   < > 34* 41* 44*  CREATININE 1.49*   < > 1.48* 1.35* 1.13  CALCIUM 9.1   < > 7.9* 8.4* 7.9*  PROT 7.8  --   --   --   --   ALBUMIN 3.7  --   --   --   --   AST 29  --   --   --   --   ALT 18  --   --   --   --   ALKPHOS 62  --   --   --   --   BILITOT 1.0  --   --   --   --   GFRNONAA 44*   < > 44* 50* >60  ANIONGAP 16*   < >  12 15 9    < > = values in this interval not displayed.     Hematology Recent Labs  Lab 10/07/20 0539 10/08/20 0225 10/09/20 1112  WBC 17.1* 17.7* 15.2*  RBC 3.42* 3.62* 3.81*  HGB 7.9* 8.2* 8.8*  HCT 25.6* 27.5* 29.0*  MCV 74.9* 76.0* 76.1*  MCH 23.1* 22.7* 23.1*  MCHC 30.9 29.8* 30.3  RDW 17.9* 18.4* 18.6*  PLT 196 228 262    BNP Recent Labs  Lab 10/06/20 1042  BNP 1,097.0*     DDimer No results for input(s): DDIMER in the last 168 hours.   Radiology    CT HEAD WO CONTRAST  Result Date: 10/09/2020 CLINICAL DATA:  Stroke follow-up EXAM: CT HEAD WITHOUT CONTRAST TECHNIQUE: Contiguous axial images were obtained from the base of the skull through the vertex without intravenous contrast. COMPARISON:  None. FINDINGS: Brain: There is no mass, hemorrhage or extra-axial collection. There is generalized atrophy without lobar predilection. Hypodensity of the white matter is most commonly associated with chronic microvascular disease. Old small vessel infarcts of the left thalamus and left cerebellum. Vascular: No abnormal hyperdensity of the major intracranial arteries or dural venous sinuses. No intracranial atherosclerosis. Skull: The visualized skull base, calvarium and extracranial soft tissues are normal.  Sinuses/Orbits: Right mastoid effusion. Nasopharynx is clear. Paranasal sinuses are unremarkable. The orbits are normal. IMPRESSION: 1. No acute intracranial abnormality. 2. Old small vessel infarcts of the left thalamus and left cerebellum. 3. Right mastoid effusion. Electronically Signed   By: Ulyses Jarred M.D.   On: 10/09/2020 03:09   DG Chest Port 1 View  Result Date: 10/09/2020 CLINICAL DATA:  Congestive heart failure EXAM: PORTABLE CHEST 1 VIEW COMPARISON:  10/08/2020 FINDINGS: Lung volumes are small, but are symmetric and are stable since prior examination. Mild interstitial pulmonary infiltrate has improved in the interval since prior examination, though persists. Tiny bilateral pleural effusions are present, decreased in size since prior examination. No pneumothorax. Mild to moderate cardiomegaly is unchanged. Left subclavian dual lead pacemaker is unchanged. No acute bone abnormality. IMPRESSION: Stable pulmonary hypoinflation. Improving cardiogenic failure. Stable mild-to-moderate cardiomegaly. Electronically Signed   By: Fidela Salisbury MD   On: 10/09/2020 06:21   DG Swallowing Func-Speech Pathology  Result Date: 10/09/2020 Completed by Greggory Keen, SLP student Supervised and reviewed by Herbie Baltimore MA CCC-SLP Objective Swallowing Evaluation: Type of Study: MBS-Modified Barium Swallow Study  Patient Details Name: Fernando [name] MRN: [id number] Date of Birth: Mar 09, 1927 Today's Date: 10/09/2020 Time: SLP Start Time (ACUTE ONLY): 0900 -SLP Stop Time (ACUTE ONLY): 0931 SLP Time Calculation (min) (ACUTE ONLY): 31 min Past Medical History: Past Medical History: Diagnosis Date . Arthritis  . Atrial fibrillation ([place])  . Carotid artery disease ([place])   Dr. Kellie Simmering - s/p left CEA . Coronary artery disease  . Essential hypertension, benign  . Hyperlipidemia  . 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 This mass was initially felt to be 1.7 cm at the time of presentation but had enlarged significantly to 4 cm clinically at the time of surgery. Pathologically it was 3.4 cm. A lymph node deep to the parotid was enlarged clinically at the time of surgery but was negative  . Tachycardia-bradycardia syndrome [facility])  Past Surgical History: Past Surgical History: Procedure Laterality Date . CAROTID ENDARTERECTOMY Left Jan. 3, 2009  CE . CATARACT EXTRACTION W/ INTRAOCULAR LENS IMPLANT    Bilateral .  ESOPHAGOGASTRODUODENOSCOPY N/A 03/29/2018  Procedure: ESOPHAGOGASTRODUODENOSCOPY (EGD);  Surgeon: Danie Binder, MD;  [facility];  Service: Endoscopy;  Laterality: N/A; . EYE SURGERY   . INSERT / REPLACE / REMOVE PACEMAKER   . IR PERCUTANEOUS ART THROMBECTOMY/INFUSION INTRACRANIAL INC DIAG ANGIO  10/06/2020 . IR US GUIDE VASC ACCESS LEFT  10/06/2020 . IR US GUIDE VASC ACCESS RIGHT  10/06/2020 . IR US GUIDE VASC ACCESS RIGHT  10/06/2020 . PACEMAKER INSERTION  10-01-2012 . PARATHYROIDECTOMY  10/28/2012 . PAROTIDECTOMY  10/28/2012  Procedure: PAROTIDECTOMY;  Surgeon: Ascencion Dike, MD;  Location: [place];  Service: ENT;  Laterality: Right;  Total Parotidectomy . PERMANENT PACEMAKER INSERTION N/A 10/01/2012  Procedure: PERMANENT PACEMAKER INSERTION;  Surgeon: Evans Lance, MD;  Location: [facility] CATH LAB;  Service: Cardiovascular;  Laterality: N/A; . RADIOLOGY WITH ANESTHESIA N/A 10/07/2020  Procedure: MRI WITH ANESTHESIA;  Surgeon: Radiologist, Medication, MD;  Location: [place];  Service: Radiology;  Laterality: N/A; . RADIOLOGY WITH ANESTHESIA N/A 10/06/2020  Procedure: IR WITH ANESTHESIA;  Surgeon: Luanne Bras, MD;  Location: [place];  Service: Radiology;  Laterality: N/A; HPI: The pt is a 86 yo male presenting with L sided weakness and AMS. Upon work-up, found to have large R MCA stenosis and is now s/p stenting. Pt intubated for procedure 11/12 and extubated 11/13. PMH includes: a fib  on Xarelto, CAD, HTN, HLD, parotidectomy 10/2012, pacemaker 10/2012.  Subjective: Pt was pleasant and cooperative Assessment / Plan / Recommendation CHL IP CLINICAL IMPRESSIONS 10/09/2020 Clinical Impression Pt was seen for MBS which revealed a moderate oropharyngeal dysphagia, suspected due to baseline structural abnormalities of the cervical spine exacerbated by weakness. Pt's dysphagia is characterized by premature spillage and reduced epiglottic deflection, causing instances of penetration with liquids. The pt demonstrates moderate oral weakness, and reduced left facial ROM resulted in difficulty creating an adequate labial seal for cup sips. However, pt could acheive labial seal for straw sips. Premature spillage of thin liquids fell to the pyriforms, resulting in a moderate amount of material penetrating above the level of the vocal folds prior to initiating the swallow. The pt also demonstrated reduced epiglottic deflection, causing several instances of penetration of thin, nectar, and honey thick liquid during the swallow. When cued to produce a cough, pt would successfully eject the material from the airway and independently produce an aditional swallow in order to fully clear it. Despite consistent penetration of liquids, no aspiration was observed. Mild pharyngeal residue was also observed in the valleculae and pyriforms across POs, but most consistently with puree. Reduced UES opening was observed secondary to appearance of cervical lordosis. Recommend dys 1 diet with thin liquids. Pt should follow solids with liquids and produce frequent volitional coughing after POs. SLP will continue to f/u acutely for diet toleration and advancement.  SLP Visit Diagnosis Dysphagia, oropharyngeal phase (R13.12) Attention and concentration deficit following -- Frontal lobe and executive function deficit following -- Impact on safety and function Moderate aspiration risk   CHL IP TREATMENT RECOMMENDATION 10/09/2020  Treatment Recommendations Therapy as outlined in treatment plan below   Prognosis 10/09/2020 Prognosis for Safe Diet Advancement Fair Barriers to Reach Goals -- Barriers/Prognosis Comment -- CHL IP DIET RECOMMENDATION 10/09/2020 SLP Diet Recommendations Dysphagia 1 (Puree) solids;Thin liquid Liquid Administration via Straw Medication Administration Crushed with puree Compensations Hard cough after swallow;Follow solids with liquid Postural Changes Seated upright at 90 degrees   CHL IP OTHER RECOMMENDATIONS 10/09/2020 Recommended Consults -- Oral Care Recommendations Oral care BID Other Recommendations --  CHL IP FOLLOW UP RECOMMENDATIONS 10/09/2020 Follow up Recommendations Inpatient Rehab   CHL IP FREQUENCY AND DURATION 10/09/2020 Speech Therapy Frequency (ACUTE ONLY) min 2x/week Treatment Duration 2 weeks      CHL IP ORAL PHASE 10/09/2020 Oral Phase Impaired Oral - Pudding Teaspoon -- Oral - Pudding Cup -- Oral - Honey Teaspoon Weak lingual manipulation;Lingual/palatal residue Oral - Honey Cup -- Oral - Nectar Teaspoon -- Oral - Nectar Cup -- Oral - Nectar Straw Lingual/palatal residue;Weak lingual manipulation Oral - Thin Teaspoon -- Oral - Thin Cup -- Oral - Thin Straw Premature spillage;Left anterior bolus loss;Right anterior bolus loss;Weak lingual manipulation Oral - Puree Weak lingual manipulation Oral - Mech Soft -- Oral - Regular -- Oral - Multi-Consistency -- Oral - Pill -- Oral Phase - Comment --  CHL IP PHARYNGEAL PHASE 10/09/2020 Pharyngeal Phase Impaired Pharyngeal- Pudding Teaspoon -- Pharyngeal -- Pharyngeal- Pudding Cup -- Pharyngeal -- Pharyngeal- Honey Teaspoon Reduced epiglottic inversion;Penetration/Aspiration during swallow;Pharyngeal residue - valleculae;Pharyngeal residue - pyriform Pharyngeal Material enters airway, remains ABOVE vocal cords then ejected out Pharyngeal- Honey Cup -- Pharyngeal -- Pharyngeal- Nectar Teaspoon -- Pharyngeal -- Pharyngeal- Nectar Cup -- Pharyngeal --  Pharyngeal- Nectar Straw Penetration/Aspiration during swallow;Reduced epiglottic inversion;Pharyngeal residue - valleculae;Pharyngeal residue - pyriform Pharyngeal Material enters airway, remains ABOVE vocal cords then ejected out Pharyngeal- Thin Teaspoon -- Pharyngeal -- Pharyngeal- Thin Cup -- Pharyngeal -- Pharyngeal- Thin Straw Penetration/Aspiration before swallow;Penetration/Aspiration during swallow;Pharyngeal residue - valleculae;Pharyngeal residue - pyriform;Reduced epiglottic inversion Pharyngeal Material enters airway, remains ABOVE vocal cords then ejected out Pharyngeal- Puree Pharyngeal residue - valleculae;Pharyngeal residue - pyriform;Reduced epiglottic inversion Pharyngeal -- Pharyngeal- Mechanical Soft -- Pharyngeal -- Pharyngeal- Regular -- Pharyngeal -- Pharyngeal- Multi-consistency -- Pharyngeal -- Pharyngeal- Pill -- Pharyngeal -- Pharyngeal Comment --  No flowsheet data found. DeBlois, Katherene Ponto 10/09/2020, 2:16 PM              VAS US CAROTID  Result Date: 10/09/2020 Carotid Arterial Duplex Study Indications:       Left hemiparesis, s/p right ICA angioplasty. Risk Factors:      Hypertension, hyperlipidemia, coronary artery disease. Other Factors:     Atrial fibrillation. Comparison Study:  No prior study Performing Technologist: Maudry Mayhew MHA, RDMS, RVT, RDCS  Examination Guidelines: A complete evaluation includes B-mode imaging, spectral Doppler, color Doppler, and power Doppler as needed of all accessible portions of each vessel. Bilateral testing is considered an integral part of a complete examination. Limited examinations for reoccurring indications may be performed as noted.  Right Carotid Findings: +----------+--------+--------+--------+--------------------+-------------------+           PSV cm/sEDV cm/sStenosisPlaque Description  Comments            +----------+--------+--------+--------+--------------------+-------------------+ CCA Prox  83      22                                                       +----------+--------+--------+--------+--------------------+-------------------+ CCA Distal100     27              calcific                                +----------+--------+--------+--------+--------------------+-------------------+ ICA Prox  778     320     80-99%  calcific                                +----------+--------+--------+--------+--------------------+-------------------+  ICA Mid   198     63                                  post stenotic                                                             turbulence          +----------+--------+--------+--------+--------------------+-------------------+ ICA Distal92      28                                  tortuous            +----------+--------+--------+--------+--------------------+-------------------+ ECA       71                      heterogenous and                                                          calcific                                +----------+--------+--------+--------+--------------------+-------------------+ +----------+--------+-------+----------------+-------------------+           PSV cm/sEDV cmsDescribe        Arm Pressure (mmHG) +----------+--------+-------+----------------+-------------------+ [id number]            Multiphasic, WNL                    +----------+--------+-------+----------------+-------------------+ +---------+--------+---+--------+--+---------+ VertebralPSV cm/s126EDV cm/s20Antegrade +---------+--------+---+--------+--+---------+  Left Carotid Findings: +----------+--------+-------+--------+--------------------------------+--------+           PSV cm/sEDV    StenosisPlaque Description              Comments                   cm/s                                                    +----------+--------+-------+--------+--------------------------------+--------+ CCA Prox  65       12             smooth and heterogenous                  +----------+--------+-------+--------+--------------------------------+--------+ CCA Distal58      14             smooth and heterogenous                  +----------+--------+-------+--------+--------------------------------+--------+ ICA Prox  25      7      1-39%   smooth, heterogenous and  calcific                                 +----------+--------+-------+--------+--------------------------------+--------+ ICA Distal94      18                                                      +----------+--------+-------+--------+--------------------------------+--------+ ECA       65                                                              +----------+--------+-------+--------+--------------------------------+--------+ +----------+--------+--------+----------------+-------------------+           PSV cm/sEDV cm/sDescribe        Arm Pressure (mmHG) +----------+--------+--------+----------------+-------------------+ [id number]              Multiphasic, WNL                    +----------+--------+--------+----------------+-------------------+ +---------+--------+--------+----------+ VertebralPSV cm/sEDV cm/sRetrograde +---------+--------+--------+----------+   Summary: Right Carotid: Velocities in the right ICA are consistent with a 80-99%                stenosis. Left Carotid: Velocities in the left ICA are consistent with a 1-39% stenosis. Vertebrals:  Right vertebral artery demonstrates antegrade flow. Left vertebral              artery demonstrates retrograde flow. Subclavians: Normal flow hemodynamics were seen in bilateral subclavian              arteries. *See table(s) above for measurements and observations.  Electronically signed by Servando Snare MD on 10/09/2020 at 4:31:20 PM.    Final     Cardiac Studies   Echo 10/07/20 . Akinesis of the distal  septum and apex; overall mildly reduced LV  function; no thrombus noted using definity.  2. Left ventricular ejection fraction, by estimation, is 40 to 45%. The  left ventricle has mildly decreased function. The left ventricle  demonstrates regional wall motion abnormalities (see scoring  diagram/findings for description). Left ventricular  diastolic function could not be evaluated.  3. Right ventricular systolic function is normal. The right ventricular  size is normal.  4. Left atrial size was severely dilated.  5. Right atrial size was severely dilated.  6. The mitral valve is normal in structure. Mild mitral valve  regurgitation. No evidence of mitral stenosis.  7. Tricuspid valve regurgitation is moderate.  8. The aortic valve is tricuspid. Aortic valve regurgitation is mild.  Mild aortic valve stenosis.  9. The inferior vena cava is normal in size with greater than 50%  respiratory variability, suggesting right atrial pressure of 3 mmHg.   Patient Profile     86 y.o. male  with history tachy-brady syndrome s/p pacemaker placement, permanent aib, carotid stenosis s/p L CEA and high grade proximal right carotid stenosis, recent TIA with left hemiparehsis and expressive aphasia in August 2021, dyslipidemia, HTN, osteoarthritis, remote squamous cell carcinoma  who is being seen for the evaluation of right MCA stroke, strep agalactiae bacteremia, and new cardiomyopathy with LVEF 40-45%.   Assessment & Plan  Sepsis/streptococcus agalactiae bacteremia Improving and weaned off pressors. -Plan for TEE today to assess for endocarditis -Continue ABX per ID -Follow-up surveillence cultures  Right MCA stroke/history of TIA - Known severe Internal carotid stenosis on the right side - Holding AC given concern for IE as etiology of stroke; awaiting TEE - Continue ASA - Neurology following  Chronic systolic heart failure TTE with newly depressed EF with LVEF 40-45% with ?  Anteroseptal akinesis vs dyskinesis in setting of pacing. No ischemic symptoms. Given age and comorbities, will not pursue ischemic work-up at this time. Currently appears relatively euvolemic.  -Hold further diuresis for now as K 2.9 and patient appears euvolemic -Cr improving off diuretics -Continue medical management with ASA and statin -Start metoprolol 25mg  XL now that blood pressure improved and off pressors -ACE/ARB in future pending Bps  Permanent Afib Rate controlled. - Start metop as above - Awaiting TEE to assess for IE prior to resuming xarelto - Continue ASA for now  Tachy-brady syndrome s/p pacemaker -Follow-up TEE -Patient likely not candidate for lead extraction given age and comorbities -If evidence of IE, will likely need continued treatment with ABX    For questions or updates, please contact [place] Please consult [email or web address] for contact info under      Signed, Freada Bergeron, MD  10/10/2020, 11:41 AM

## 2020-10-10 NOTE — Progress Notes (Signed)
    Transesophageal Echocardiogram Note  Fernando [name] [id number] Apr 14, 1927  Procedure: Transesophageal Echocardiogram Indications: Bacteremia  Procedure Details Consent: Obtained Time Out: Verified patient identification, verified procedure, site/side was marked, verified correct patient position, special equipment/implants available, Radiology Safety Procedures followed,  medications/allergies/relevent history reviewed, required imaging and test results available.  Performed  Medications:  Pt sedated by anesthesia with diprovan 80 mg IV total.  Akinesis of the distal septum and apex; EF 50; severe LAE; spontaneous contrast in LAA but no thrombus; moderate RAE; pacer wire in RV; thickened aortic valve with reduced cusp excursion; mild AI; mild MR; moderate to severe TR; no vegetations.      Complications: No apparent complications Patient did tolerate procedure well.  Kirk Ruths, MD

## 2020-10-10 NOTE — Progress Notes (Signed)
Inpatient Rehabilitation Admissions Coordinator  I met at bedside with pt and son, Marcello Moores. I will begin insurance authorization today after TEE results made available.  Danne Baxter, RN, MSN Rehab Admissions Coordinator [phone] 10/10/2020 11:55 AM

## 2020-10-10 NOTE — Progress Notes (Signed)
Notified Elink about pt's multiple runs of Vtach. He is asymptomatic at this time.  Lab orders placed at this time.

## 2020-10-10 NOTE — Anesthesia Preprocedure Evaluation (Addendum)
Anesthesia Evaluation  Patient identified by MRN, date of birth, ID band Patient confused    Reviewed: Allergy & Precautions, NPO status , Patient's Chart, lab work & pertinent test results, Unable to perform ROS - Chart review only  Airway Mallampati: II  TM Distance: >3 FB     Dental  (+) Edentulous Upper, Edentulous Lower   Pulmonary former smoker,    breath sounds clear to auscultation       Cardiovascular hypertension, + CAD and +CHF  + pacemaker + Valvular Problems/Murmurs AS  Rhythm:Irregular Rate:Normal  Echo 10/07/2020 1. Akinesis of the distal septum and apex; overall mildly reduced LV function; no thrombus noted using definity.  2. Left ventricular ejection fraction, by estimation, is 40 to 45%. The left ventricle has mildly decreased function. The left ventricle demonstrates regional wall motion abnormalities (see scoring diagram/findings for description). Left ventricular diastolic function could not be evaluated.  3. Right ventricular systolic function is normal. The right ventricular size is normal.  4. Left atrial size was severely dilated.  5. Right atrial size was severely dilated.  6. The mitral valve is normal in structure. Mild mitral valve  regurgitation. No evidence of mitral stenosis.  7. Tricuspid valve regurgitation is moderate.  8. The aortic valve is tricuspid. Aortic valve regurgitation is mild. Mild aortic valve stenosis.  9. The inferior vena cava is normal in size with greater than 50% respiratory variability, suggesting right atrial pressure of 3 mmHg.    Pacemaker interrogation VVIR, 97% VP, 3% VS   Neuro/Psych PSYCHIATRIC DISORDERS Anxiety Depression CVA    GI/Hepatic negative GI ROS, Neg liver ROS,   Endo/Other  Hypothyroidism   Renal/GU negative Renal ROS     Musculoskeletal  (+) Arthritis ,   Abdominal   Peds  Hematology  (+) anemia ,   Anesthesia Other Findings    Reproductive/Obstetrics                           Anesthesia Physical  Anesthesia Plan  ASA: III  Anesthesia Plan: MAC   Post-op Pain Management:    Induction: Intravenous  PONV Risk Score and Plan: 2 and TIVA, Propofol infusion and Treatment may vary due to age or medical condition  Airway Management Planned: Natural Airway  Additional Equipment: None  Intra-op Plan:   Post-operative Plan:   Informed Consent: I have reviewed the patients History and Physical, chart, labs and discussed the procedure including the risks, benefits and alternatives for the proposed anesthesia with the patient or authorized representative who has indicated his/her understanding and acceptance.   Patient has DNR.  Discussed DNR with power of attorney and Suspend DNR.   Consent reviewed with POA and History available from chart only  Plan Discussed with: CRNA  Anesthesia Plan Comments:        Anesthesia Quick Evaluation

## 2020-10-10 NOTE — Anesthesia Procedure Notes (Signed)
Procedure Name: MAC Date/Time: 10/10/2020 1:05 PM Performed by: Amadeo Garnet, CRNA Pre-anesthesia Checklist: Patient identified, Emergency Drugs available, Suction available and Patient being monitored Patient Re-evaluated:Patient Re-evaluated prior to induction Oxygen Delivery Method: Nasal cannula Preoxygenation: Pre-oxygenation with 100% oxygen Induction Type: IV induction Placement Confirmation: positive ETCO2 Dental Injury: Teeth and Oropharynx as per pre-operative assessment

## 2020-10-10 NOTE — Progress Notes (Signed)
Progress Note  Patient Name: Fernando [name] Date of Encounter: 10/10/2020  [facility] Cardiologist: No primary care provider on file.   Subjective   Feels well and sitting up in chair. No chest pain. Gets very SOB with minimal exertion.  Inpatient Medications    Scheduled Meds: . aspirin EC  325 mg Oral Daily  . atorvastatin  20 mg Oral Daily  . Chlorhexidine Gluconate Cloth  6 each Topical Daily  . enoxaparin (LOVENOX) injection  30 mg Subcutaneous Q24H  . insulin aspart  0-9 Units Subcutaneous Q4H  . levothyroxine  50 mcg Oral Q0600  . loperamide  2 mg Oral Once  . mouth rinse  15 mL Mouth Rinse BID  . potassium chloride  40 mEq Oral Once   Continuous Infusions: . sodium chloride Stopped (10/10/20 0501)  . sodium chloride    . cefTRIAXone (ROCEPHIN)  IV Stopped (10/10/20 0116)  . norepinephrine (LEVOPHED) Adult infusion Stopped (10/10/20 0008)   PRN Meds: acetaminophen **OR** acetaminophen   Vital Signs    Vitals:   10/10/20 0000 10/10/20 0326 10/10/20 0700 10/10/20 0800  BP: (!) 148/65  124/74 (!) 148/92  Pulse: 64  74 77  Resp: (!) 32  (!) 21 (!) 29  Temp:  100 F (37.8 C)  98.5 F (36.9 C)  TempSrc:  Oral  Oral  SpO2: 90%  95% 97%  Weight:      Height:        Intake/Output Summary (Last 24 hours) at 10/10/2020 1141 Last data filed at 10/10/2020 0800 Gross per 24 hour  Intake 1684.79 ml  Output 150 ml  Net 1534.79 ml   Last 3 Weights 10/06/2020 07/02/2020 05/08/2020  Weight (lbs) 143 lb 143 lb 14.4 oz 130 lb  Weight (kg) 64.864 kg 65.273 kg 58.968 kg      Telemetry    V-paced - Personally Reviewed  ECG    No new ECG - Personally Reviewed  Physical Exam   GEN: No acute distress. Sitting up comfortably in chair   Neck: No JVD Cardiac: Irregular, no murmur Respiratory: Diminished at bases. GI: Soft, nontender, non-distended  MS: Warm, trace edema Neuro:  Left sided weakness Psych: Normal affect   Labs    High Sensitivity  Troponin:  No results for input(s): TROPONINIHS in the last 720 hours.    Chemistry Recent Labs  Lab 10/06/20 1042 10/06/20 1738 10/08/20 0225 10/09/20 1112 10/10/20 0340  NA 138   < > 146* 150* 146*  K 3.8   < > 4.3 3.3* 2.9*  CL 102   < > 118* 118* 119*  CO2 20*   < > 16* 17* 18*  GLUCOSE 172*   < > 108* 118* 115*  BUN 37*   < > 34* 41* 44*  CREATININE 1.49*   < > 1.48* 1.35* 1.13  CALCIUM 9.1   < > 7.9* 8.4* 7.9*  PROT 7.8  --   --   --   --   ALBUMIN 3.7  --   --   --   --   AST 29  --   --   --   --   ALT 18  --   --   --   --   ALKPHOS 62  --   --   --   --   BILITOT 1.0  --   --   --   --   GFRNONAA 44*   < > 44* 50* >60  ANIONGAP 16*   < >  12 15 9    < > = values in this interval not displayed.     Hematology Recent Labs  Lab 10/07/20 0539 10/08/20 0225 10/09/20 1112  WBC 17.1* 17.7* 15.2*  RBC 3.42* 3.62* 3.81*  HGB 7.9* 8.2* 8.8*  HCT 25.6* 27.5* 29.0*  MCV 74.9* 76.0* 76.1*  MCH 23.1* 22.7* 23.1*  MCHC 30.9 29.8* 30.3  RDW 17.9* 18.4* 18.6*  PLT 196 228 262    BNP Recent Labs  Lab 10/06/20 1042  BNP 1,097.0*     DDimer No results for input(s): DDIMER in the last 168 hours.   Radiology    CT HEAD WO CONTRAST  Result Date: 10/09/2020 CLINICAL DATA:  Stroke follow-up EXAM: CT HEAD WITHOUT CONTRAST TECHNIQUE: Contiguous axial images were obtained from the base of the skull through the vertex without intravenous contrast. COMPARISON:  None. FINDINGS: Brain: There is no mass, hemorrhage or extra-axial collection. There is generalized atrophy without lobar predilection. Hypodensity of the white matter is most commonly associated with chronic microvascular disease. Old small vessel infarcts of the left thalamus and left cerebellum. Vascular: No abnormal hyperdensity of the major intracranial arteries or dural venous sinuses. No intracranial atherosclerosis. Skull: The visualized skull base, calvarium and extracranial soft tissues are normal.  Sinuses/Orbits: Right mastoid effusion. Nasopharynx is clear. Paranasal sinuses are unremarkable. The orbits are normal. IMPRESSION: 1. No acute intracranial abnormality. 2. Old small vessel infarcts of the left thalamus and left cerebellum. 3. Right mastoid effusion. Electronically Signed   By: Ulyses Jarred M.D.   On: 10/09/2020 03:09   DG Chest Port 1 View  Result Date: 10/09/2020 CLINICAL DATA:  Congestive heart failure EXAM: PORTABLE CHEST 1 VIEW COMPARISON:  10/08/2020 FINDINGS: Lung volumes are small, but are symmetric and are stable since prior examination. Mild interstitial pulmonary infiltrate has improved in the interval since prior examination, though persists. Tiny bilateral pleural effusions are present, decreased in size since prior examination. No pneumothorax. Mild to moderate cardiomegaly is unchanged. Left subclavian dual lead pacemaker is unchanged. No acute bone abnormality. IMPRESSION: Stable pulmonary hypoinflation. Improving cardiogenic failure. Stable mild-to-moderate cardiomegaly. Electronically Signed   By: Fidela Salisbury MD   On: 10/09/2020 06:21   DG Swallowing Func-Speech Pathology  Result Date: 10/09/2020 Completed by Greggory Keen, SLP student Supervised and reviewed by Herbie Baltimore MA CCC-SLP Objective Swallowing Evaluation: Type of Study: MBS-Modified Barium Swallow Study  Patient Details Name: Fernando [name] MRN: [id number] Date of Birth: 1927-02-17 Today's Date: 10/09/2020 Time: SLP Start Time (ACUTE ONLY): 0900 -SLP Stop Time (ACUTE ONLY): 0931 SLP Time Calculation (min) (ACUTE ONLY): 31 min Past Medical History: Past Medical History: Diagnosis Date . Arthritis  . Atrial fibrillation ([place])  . Carotid artery disease ([place])   Dr. Kellie Simmering - s/p left CEA . Coronary artery disease  . Essential hypertension, benign  . Hyperlipidemia  . 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 This mass was initially felt to be 1.7 cm at the time of presentation but had enlarged significantly to 4 cm clinically at the time of surgery. Pathologically it was 3.4 cm. A lymph node deep to the parotid was enlarged clinically at the time of surgery but was negative  . Tachycardia-bradycardia syndrome [facility])  Past Surgical History: Past Surgical History: Procedure Laterality Date . CAROTID ENDARTERECTOMY Left Jan. 3, 2009  CE . CATARACT EXTRACTION W/ INTRAOCULAR LENS IMPLANT    Bilateral .  ESOPHAGOGASTRODUODENOSCOPY N/A 03/29/2018  Procedure: ESOPHAGOGASTRODUODENOSCOPY (EGD);  Surgeon: Danie Binder, MD;  [facility];  Service: Endoscopy;  Laterality: N/A; . EYE SURGERY   . INSERT / REPLACE / REMOVE PACEMAKER   . IR PERCUTANEOUS ART THROMBECTOMY/INFUSION INTRACRANIAL INC DIAG ANGIO  10/06/2020 . IR US GUIDE VASC ACCESS LEFT  10/06/2020 . IR US GUIDE VASC ACCESS RIGHT  10/06/2020 . IR US GUIDE VASC ACCESS RIGHT  10/06/2020 . PACEMAKER INSERTION  10-01-2012 . PARATHYROIDECTOMY  10/28/2012 . PAROTIDECTOMY  10/28/2012  Procedure: PAROTIDECTOMY;  Surgeon: Ascencion Dike, MD;  Location: [place];  Service: ENT;  Laterality: Right;  Total Parotidectomy . PERMANENT PACEMAKER INSERTION N/A 10/01/2012  Procedure: PERMANENT PACEMAKER INSERTION;  Surgeon: Evans Lance, MD;  Location: [facility] CATH LAB;  Service: Cardiovascular;  Laterality: N/A; . RADIOLOGY WITH ANESTHESIA N/A 10/07/2020  Procedure: MRI WITH ANESTHESIA;  Surgeon: Radiologist, Medication, MD;  Location: [place];  Service: Radiology;  Laterality: N/A; . RADIOLOGY WITH ANESTHESIA N/A 10/06/2020  Procedure: IR WITH ANESTHESIA;  Surgeon: Luanne Bras, MD;  Location: [place];  Service: Radiology;  Laterality: N/A; HPI: The pt is a 86 yo male presenting with L sided weakness and AMS. Upon work-up, found to have large R MCA stenosis and is now s/p stenting. Pt intubated for procedure 11/12 and extubated 11/13. PMH includes: a fib  on Xarelto, CAD, HTN, HLD, parotidectomy 10/2012, pacemaker 10/2012.  Subjective: Pt was pleasant and cooperative Assessment / Plan / Recommendation CHL IP CLINICAL IMPRESSIONS 10/09/2020 Clinical Impression Pt was seen for MBS which revealed a moderate oropharyngeal dysphagia, suspected due to baseline structural abnormalities of the cervical spine exacerbated by weakness. Pt's dysphagia is characterized by premature spillage and reduced epiglottic deflection, causing instances of penetration with liquids. The pt demonstrates moderate oral weakness, and reduced left facial ROM resulted in difficulty creating an adequate labial seal for cup sips. However, pt could acheive labial seal for straw sips. Premature spillage of thin liquids fell to the pyriforms, resulting in a moderate amount of material penetrating above the level of the vocal folds prior to initiating the swallow. The pt also demonstrated reduced epiglottic deflection, causing several instances of penetration of thin, nectar, and honey thick liquid during the swallow. When cued to produce a cough, pt would successfully eject the material from the airway and independently produce an aditional swallow in order to fully clear it. Despite consistent penetration of liquids, no aspiration was observed. Mild pharyngeal residue was also observed in the valleculae and pyriforms across POs, but most consistently with puree. Reduced UES opening was observed secondary to appearance of cervical lordosis. Recommend dys 1 diet with thin liquids. Pt should follow solids with liquids and produce frequent volitional coughing after POs. SLP will continue to f/u acutely for diet toleration and advancement.  SLP Visit Diagnosis Dysphagia, oropharyngeal phase (R13.12) Attention and concentration deficit following -- Frontal lobe and executive function deficit following -- Impact on safety and function Moderate aspiration risk   CHL IP TREATMENT RECOMMENDATION 10/09/2020  Treatment Recommendations Therapy as outlined in treatment plan below   Prognosis 10/09/2020 Prognosis for Safe Diet Advancement Fair Barriers to Reach Goals -- Barriers/Prognosis Comment -- CHL IP DIET RECOMMENDATION 10/09/2020 SLP Diet Recommendations Dysphagia 1 (Puree) solids;Thin liquid Liquid Administration via Straw Medication Administration Crushed with puree Compensations Hard cough after swallow;Follow solids with liquid Postural Changes Seated upright at 90 degrees   CHL IP OTHER RECOMMENDATIONS 10/09/2020 Recommended Consults -- Oral Care Recommendations Oral care BID Other Recommendations --  CHL IP FOLLOW UP RECOMMENDATIONS 10/09/2020 Follow up Recommendations Inpatient Rehab   CHL IP FREQUENCY AND DURATION 10/09/2020 Speech Therapy Frequency (ACUTE ONLY) min 2x/week Treatment Duration 2 weeks      CHL IP ORAL PHASE 10/09/2020 Oral Phase Impaired Oral - Pudding Teaspoon -- Oral - Pudding Cup -- Oral - Honey Teaspoon Weak lingual manipulation;Lingual/palatal residue Oral - Honey Cup -- Oral - Nectar Teaspoon -- Oral - Nectar Cup -- Oral - Nectar Straw Lingual/palatal residue;Weak lingual manipulation Oral - Thin Teaspoon -- Oral - Thin Cup -- Oral - Thin Straw Premature spillage;Left anterior bolus loss;Right anterior bolus loss;Weak lingual manipulation Oral - Puree Weak lingual manipulation Oral - Mech Soft -- Oral - Regular -- Oral - Multi-Consistency -- Oral - Pill -- Oral Phase - Comment --  CHL IP PHARYNGEAL PHASE 10/09/2020 Pharyngeal Phase Impaired Pharyngeal- Pudding Teaspoon -- Pharyngeal -- Pharyngeal- Pudding Cup -- Pharyngeal -- Pharyngeal- Honey Teaspoon Reduced epiglottic inversion;Penetration/Aspiration during swallow;Pharyngeal residue - valleculae;Pharyngeal residue - pyriform Pharyngeal Material enters airway, remains ABOVE vocal cords then ejected out Pharyngeal- Honey Cup -- Pharyngeal -- Pharyngeal- Nectar Teaspoon -- Pharyngeal -- Pharyngeal- Nectar Cup -- Pharyngeal --  Pharyngeal- Nectar Straw Penetration/Aspiration during swallow;Reduced epiglottic inversion;Pharyngeal residue - valleculae;Pharyngeal residue - pyriform Pharyngeal Material enters airway, remains ABOVE vocal cords then ejected out Pharyngeal- Thin Teaspoon -- Pharyngeal -- Pharyngeal- Thin Cup -- Pharyngeal -- Pharyngeal- Thin Straw Penetration/Aspiration before swallow;Penetration/Aspiration during swallow;Pharyngeal residue - valleculae;Pharyngeal residue - pyriform;Reduced epiglottic inversion Pharyngeal Material enters airway, remains ABOVE vocal cords then ejected out Pharyngeal- Puree Pharyngeal residue - valleculae;Pharyngeal residue - pyriform;Reduced epiglottic inversion Pharyngeal -- Pharyngeal- Mechanical Soft -- Pharyngeal -- Pharyngeal- Regular -- Pharyngeal -- Pharyngeal- Multi-consistency -- Pharyngeal -- Pharyngeal- Pill -- Pharyngeal -- Pharyngeal Comment --  No flowsheet data found. DeBlois, Katherene Ponto 10/09/2020, 2:16 PM              VAS US CAROTID  Result Date: 10/09/2020 Carotid Arterial Duplex Study Indications:       Left hemiparesis, s/p right ICA angioplasty. Risk Factors:      Hypertension, hyperlipidemia, coronary artery disease. Other Factors:     Atrial fibrillation. Comparison Study:  No prior study Performing Technologist: Maudry Mayhew MHA, RDMS, RVT, RDCS  Examination Guidelines: A complete evaluation includes B-mode imaging, spectral Doppler, color Doppler, and power Doppler as needed of all accessible portions of each vessel. Bilateral testing is considered an integral part of a complete examination. Limited examinations for reoccurring indications may be performed as noted.  Right Carotid Findings: +----------+--------+--------+--------+--------------------+-------------------+           PSV cm/sEDV cm/sStenosisPlaque Description  Comments            +----------+--------+--------+--------+--------------------+-------------------+ CCA Prox  83      22                                                       +----------+--------+--------+--------+--------------------+-------------------+ CCA Distal100     27              calcific                                +----------+--------+--------+--------+--------------------+-------------------+ ICA Prox  778     320     80-99%  calcific                                +----------+--------+--------+--------+--------------------+-------------------+  ICA Mid   198     63                                  post stenotic                                                             turbulence          +----------+--------+--------+--------+--------------------+-------------------+ ICA Distal92      28                                  tortuous            +----------+--------+--------+--------+--------------------+-------------------+ ECA       71                      heterogenous and                                                          calcific                                +----------+--------+--------+--------+--------------------+-------------------+ +----------+--------+-------+----------------+-------------------+           PSV cm/sEDV cmsDescribe        Arm Pressure (mmHG) +----------+--------+-------+----------------+-------------------+ [id number]            Multiphasic, WNL                    +----------+--------+-------+----------------+-------------------+ +---------+--------+---+--------+--+---------+ VertebralPSV cm/s126EDV cm/s20Antegrade +---------+--------+---+--------+--+---------+  Left Carotid Findings: +----------+--------+-------+--------+--------------------------------+--------+           PSV cm/sEDV    StenosisPlaque Description              Comments                   cm/s                                                    +----------+--------+-------+--------+--------------------------------+--------+ CCA Prox  65       12             smooth and heterogenous                  +----------+--------+-------+--------+--------------------------------+--------+ CCA Distal58      14             smooth and heterogenous                  +----------+--------+-------+--------+--------------------------------+--------+ ICA Prox  25      7      1-39%   smooth, heterogenous and  calcific                                 +----------+--------+-------+--------+--------------------------------+--------+ ICA Distal94      18                                                      +----------+--------+-------+--------+--------------------------------+--------+ ECA       65                                                              +----------+--------+-------+--------+--------------------------------+--------+ +----------+--------+--------+----------------+-------------------+           PSV cm/sEDV cm/sDescribe        Arm Pressure (mmHG) +----------+--------+--------+----------------+-------------------+ [id number]              Multiphasic, WNL                    +----------+--------+--------+----------------+-------------------+ +---------+--------+--------+----------+ VertebralPSV cm/sEDV cm/sRetrograde +---------+--------+--------+----------+   Summary: Right Carotid: Velocities in the right ICA are consistent with a 80-99%                stenosis. Left Carotid: Velocities in the left ICA are consistent with a 1-39% stenosis. Vertebrals:  Right vertebral artery demonstrates antegrade flow. Left vertebral              artery demonstrates retrograde flow. Subclavians: Normal flow hemodynamics were seen in bilateral subclavian              arteries. *See table(s) above for measurements and observations.  Electronically signed by Servando Snare MD on 10/09/2020 at 4:31:20 PM.    Final     Cardiac Studies   Echo 10/07/20 . Akinesis of the distal  septum and apex; overall mildly reduced LV  function; no thrombus noted using definity.  2. Left ventricular ejection fraction, by estimation, is 40 to 45%. The  left ventricle has mildly decreased function. The left ventricle  demonstrates regional wall motion abnormalities (see scoring  diagram/findings for description). Left ventricular  diastolic function could not be evaluated.  3. Right ventricular systolic function is normal. The right ventricular  size is normal.  4. Left atrial size was severely dilated.  5. Right atrial size was severely dilated.  6. The mitral valve is normal in structure. Mild mitral valve  regurgitation. No evidence of mitral stenosis.  7. Tricuspid valve regurgitation is moderate.  8. The aortic valve is tricuspid. Aortic valve regurgitation is mild.  Mild aortic valve stenosis.  9. The inferior vena cava is normal in size with greater than 50%  respiratory variability, suggesting right atrial pressure of 3 mmHg.   Patient Profile     86 y.o. male  with history tachy-brady syndrome s/p pacemaker placement, permanent aib, carotid stenosis s/p L CEA and high grade proximal right carotid stenosis, recent TIA with left hemiparehsis and expressive aphasia in August 2021, dyslipidemia, HTN, osteoarthritis, remote squamous cell carcinoma  who is being seen for the evaluation of right MCA stroke, strep agalactiae bacteremia, and new cardiomyopathy with LVEF 40-45%.   Assessment & Plan  Sepsis/streptococcus agalactiae bacteremia Improving and weaned off pressors. -Plan for TEE today to assess for endocarditis -Continue ABX per ID -Follow-up surveillence cultures  Right MCA stroke/history of TIA - Known severe Internal carotid stenosis on the right side - Holding AC given concern for IE as etiology of stroke; awaiting TEE - Continue ASA - Neurology following  Chronic systolic heart failure TTE with newly depressed EF with LVEF 40-45% with ?  Anteroseptal akinesis vs dyskinesis in setting of pacing. No ischemic symptoms. Given age and comorbities, will not pursue ischemic work-up at this time. Currently appears relatively euvolemic.  -Hold further diuresis for now as K 2.9 and patient appears euvolemic -Cr improving off diuretics -Continue medical management with ASA and statin -Start metoprolol 25mg  XL now that blood pressure improved and off pressors -ACE/ARB in future pending Bps  Permanent Afib Rate controlled. - Start metop as above - Awaiting TEE to assess for IE prior to resuming xarelto - Continue ASA for now  Tachy-brady syndrome s/p pacemaker -Follow-up TEE -Patient likely not candidate for lead extraction given age and comorbities -If evidence of IE, will likely need continued treatment with ABX    For questions or updates, please contact [place] Please consult [email or web address] for contact info under      Signed, Freada Bergeron, MD  10/10/2020, 11:41 AM

## 2020-10-10 NOTE — Progress Notes (Signed)
[facility] ADULT ICU REPLACEMENT PROTOCOL   The patient does apply for the [facility] Adult ICU Electrolyte Replacment Protocol based on the criteria listed below:   1. Is GFR >/= 30 ml/min? Yes.    Patient's GFR today is >60 2. Is SCr </= 2? Yes.   Patient's SCr is 1.13 ml/kg/hr 3. Did SCr increase >/= 0.5 in 24 hours? No. 4. Abnormal electrolyte(s): k 2.9 5. Ordered repletion with: protocol 6. If a panic level lab has been reported, has the CCM MD in charge been notified? Yes.  .   Physician:  Dr Bonne Dolores, Racheal Mathurin A 10/10/2020 5:29 AM

## 2020-10-10 NOTE — Progress Notes (Signed)
STROKE TEAM PROGRESS NOTE   INTERVAL HISTORY His RN and Dr. Lynetta Mare are at the bedside.  Pt on room air, exertional dyspnea.  Neuro stable, unchanged.  Pending TEE today.   OBJECTIVE Vitals:   10/10/20 1223 10/10/20 1341 10/10/20 1350 10/10/20 1400  BP: (!) 166/70 108/63 111/79 (!) 162/65  Pulse: 70 63 67 64  Resp: (!) 26 19 (!) 22 (!) 25  Temp: 97.7 F (36.5 C) (!) 97.3 F (36.3 C)    TempSrc: Oral Temporal    SpO2: 98% 98% 100% 100%  Weight:      Height:        CBC:  Recent Labs  Lab 10/06/20 1042 10/06/20 1738 10/08/20 0225 10/09/20 1112  WBC 17.1*   < > 17.7* 15.2*  NEUTROABS 15.0*  --   --   --   HGB 9.1*   < > 8.2* 8.8*  HCT 30.1*   < > 27.5* 29.0*  MCV 76.2*   < > 76.0* 76.1*  PLT 242   < > 228 262   < > = values in this interval not displayed.    Basic Metabolic Panel:  Recent Labs  Lab 10/09/20 1112 10/10/20 0340  NA 150* 146*  K 3.3* 2.9*  CL 118* 119*  CO2 17* 18*  GLUCOSE 118* 115*  BUN 41* 44*  CREATININE 1.35* 1.13  CALCIUM 8.4* 7.9*  MG 2.1 2.2    Lipid Panel:     Component Value Date/Time   CHOL 87 10/07/2020 0539   TRIG 42 10/07/2020 0539   TRIG 39 10/07/2020 0539   HDL 53 10/07/2020 0539   CHOLHDL 1.6 10/07/2020 0539   VLDL 8 10/07/2020 0539   LDLCALC 26 10/07/2020 0539   HgbA1c:  Lab Results  Component Value Date   HGBA1C 5.7 (H) 10/07/2020   Urine Drug Screen:     Component Value Date/Time   LABOPIA NONE DETECTED 07/02/2020 1830   COCAINSCRNUR NONE DETECTED 07/02/2020 1830   LABBENZ NONE DETECTED 07/02/2020 1830   AMPHETMU NONE DETECTED 07/02/2020 1830   THCU NONE DETECTED 07/02/2020 1830   LABBARB NONE DETECTED 07/02/2020 1830    Alcohol Level     Component Value Date/Time   ETH <10 10/06/2020 1727    IMAGING  CT HEAD WO CONTRAST No acute finding by CT. Chronic small-vessel ischemic changes throughout the brain as outlined above.  CT Code Stroke CTA Head W/WO contrast CT Code Stroke CTA Neck W/WO  contrast CT PERFUSION BRAIN 10/06/2020 IMPRESSION:  1. No emergent large vessel occlusion.  2. Persistent radiographic string sign of the proximal right ICA.  3. Delayed transit throughout the entire right cerebral hemisphere and left ACA territory related to the high-grade proximal right ICA stenosis and chronically diminutive/diseased left ACA. No core infarct identified by CTP.  4. Severe right P2 stenosis.  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  6. Bilateral ground-glass lung opacities which could reflect edema or infection. Moderate right pleural effusion.  7.  Aortic Atherosclerosis (ICD10-I70.0).   CT HEAD WO CONTRAST 10/07/2020 IMPRESSION:  No acute finding by CT. Chronic small-vessel ischemic changes throughout the brain as outlined above.   DG Chest Port 1 View 10/06/2020 IMPRESSION:  Enlargement of cardiac silhouette with pulmonary vascular congestion. Increased interstitial markings in the mid to lower lungs question minimal edema or developing chronic interstitial lung disease.   CT HEAD CODE STROKE WO CONTRAST 10/06/2020 IMPRESSION:  1. Hyperdense Right MCA suspicious for Emergent Large Vessel Occlusion.  2. No acute  cortically based infarct or acute intracranial hemorrhage identified. ASPECTS 10.  3. Stable CT appearance of brain parenchyma since August.   Transthoracic Echocardiogram  1. Akinesis of the distal septum and apex; overall mildly reduced LV  function; no thrombus noted using definity.  2. Left ventricular ejection fraction, by estimation, is 40 to 45%. The  left ventricle has mildly decreased function. The left ventricle  demonstrates regional wall motion abnormalities (see scoring  diagram/findings for description). Left ventricular  diastolic function could not be evaluated.  3. Right ventricular systolic function is normal. The right ventricular  size is normal.  4. Left atrial size was severely dilated.  5. Right atrial size was  severely dilated.  6. The mitral valve is normal in structure. Mild mitral valve  regurgitation. No evidence of mitral stenosis.  7. Tricuspid valve regurgitation is moderate.  8. The aortic valve is tricuspid. Aortic valve regurgitation is mild.  Mild aortic valve stenosis.  9. The inferior vena cava is normal in size with greater than 50%  respiratory variability, suggesting right atrial pressure of 3 mmHg.    ECG - atrial fibrillation - ventricular response 78 BPM (See cardiology reading for complete details)  No results found.   PHYSICAL EXAM  Temp:  [97.3 F (36.3 C)-101.7 F (38.7 C)] 97.3 F (36.3 C) (11/16 1341) Pulse Rate:  [63-99] 64 (11/16 1400) Resp:  [19-33] 25 (11/16 1400) BP: (89-166)/(44-119) 162/65 (11/16 1400) SpO2:  [90 %-100 %] 100 % (11/16 1400)  General - Well nourished, well developed, mild SOB with exertional tachypnea  Ophthalmologic - fundi not visualized due to noncooperation.  Cardiovascular - irregularly irregular heart rate and rhythm  Neuro - eyes open, moderate to severe dysarthria but able to answer questions clearer today. Orientated to self, age, year and situation but not to month. However, able to follow simple commands, able to name and repeat but with very dysarthric and SOB with talking. Eyes able to gaze bilaterally, visual field full. PERRL. Left facial droop. Tongue protrusion midline. RUE 4/5, LUE 4-/5 without drift and finger grip and wrist extension 3/5. BLE 3-/5, able to hold knee flexion and foot on bed position but LLE weaker than left. No babinski bilaterally. Sensation subjectively symmetrical, coordination slow but grossly intact bilaterally and gait not tested.   ASSESSMENT/PLAN Mr. Jade Burright is a 86 y.o. male with history of atrial fibrillation on Xarelto, PPM, L CEA, CAD, Htn, HLD, dementia and squamous cell cancer R parotid resected 2014, presenting with left sided weakness. He did not receive IV t-PA due to  anticoagulation with Xarelto. Acute right ICA angioplasty with cerebral protection 10/06/20 at 11:28 PM Dr Earleen Newport.  Stroke: R MCA infarct due to right ICA high grade stenosis s/p angioplasty, etiology likely due to large vessel disease. However, given fever and bacteremia, needs to rule out endocarditis   CT Head - Hyperdense Right MCA suspicious for Emergent Large Vessel Occlusion. No acute cortically based infarct or acute intracranial hemorrhage identified. ASPECTS 10.   CTA H&N - Persistent radiographic string sign of the proximal right ICA. Severe right P2 stenosis.   CTP - Delayed transit throughout the entire right cerebral hemisphere and left ACA territory related to the high-grade proximal right ICA stenosis and chronically diminutive/diseased left ACA. No core infarct identified by CTP.   IR - right ICA angioplasty, 86%->41% stenosis post angioplasty  CT head repeat x 2 - No acute finding by CT. Chronic small-vessel ischemic changes throughout the brain as outlined above.  2D Echo - EF 40 to 45% down from 60-65% in 06/2020, akinesis of the distal septum and apex  Carotid doppler right ICA 80-99% stenosis  Lacey Jensen Virus 2 - negative  LDL - 26  HgbA1c - 5.7  UDS - not ordered  VTE prophylaxis - Lovenox  Xarelto (rivaroxaban) daily prior to admission, now on aspirin 325 mg daily. Consider resume AC once endocarditis ruled out.    Patient counseled to be compliant with his antithrombotic medications  Ongoing aggressive stroke risk factor management  Therapy recommendations:  CIR  Disposition:  Pending  Carotid stenosis  Hx of left ICA stenosis s/p left CEA  06/2020 CT head and neck showed right ICA bulb string sign  This admission CT head and neck again showed right ICA bulb string sign, status post angioplasty.  Right ICA 86% -> 41% stenosis post-procedure  Carotid Doppler post angioplasty right ICA 80-99% stenosis  Discussed with Dr. Earleen Newport, given clinical  improvement, no further carotid revascularization needed at this time.  Will close follow-up in the clinic.  Chronic afib Tachy-brady syndrome s/p pacer Cardiomyopathy  Pulmonary edema  On Xarelto PTA  Currently on aspirin  Consider resume AC once rule out endocarditis  2D Echo - EF 40 to 45% down from 60-65% in 06/2020, akinesis of the distal septum and apex  CXR concerning for pulmonary edema -> improved  Still has tachypnea   Cardiology on board - appreciate help  Bacteremia  Fever Leukocytosis  Tmax 101.1->afebrile  On Abx   Blood culture 2/2 group B strep - pan sensitive  WBC 17.1->17.7->15.2  Cardiology on board, pending TEE to rule out endocarditis  CCM on board  Hx of TIA  06/2020 admitted to [facility] for confusion and slurry speech with slump over and left facial droop. CTA head and neck showed right ICA bulb string sign. EF 60-65%, LDL 61 and A1C 5.9.   Hx of Hypertension Hypotension   Home BP meds: metoprolol  Current BP meds: off levophed  SBP stable now . Long-term BP goal normotensive  Hyperlipidemia  Home Lipid lowering medication: Lipitor 40 mg daily  LDL 26, goal < 70  Resume lipitor 20  Continue statin at discharge  Dysphagia   Passed barium study 11/15  On gentle IVF  On dys1 and thin  Speech on board  Other Stroke Risk Factors  Advanced age  Former cigarette smoker - quit 47 years ago  ETOH use, pt has 1 drink per week  Coronary artery disease  Other Active Problems   Code status - DNR  Aortic Atherosclerosis (ICD10-I70.0).   Acute blood loss anemia - Hgb - 8.8->7.9->8.2->8.8 CKD - stage 3b - creatinine - 1.30->1.23->1.48->1.13  Dementia - lives with son  Hypokalemia K 4.3-3.3-2.9-supplement   Hospital day # 4  This patient is critically ill due to right MCA stroke, right ICA stenosis, CHF, pacemaker, permanent A. fib, bacteremia, fever and leukocytosis and at significant risk of neurological worsening, death  form recurrent stroke, hemorrhagic conversion, heart failure, cardiogenic shock, sepsis, endocarditis. This patient's care requires constant monitoring of vital signs, hemodynamics, respiratory and cardiac monitoring, review of multiple databases, neurological assessment, discussion with family, other specialists and medical decision making of high complexity. I spent 35 minutes of neurocritical care time in the care of this patient.  I have discussed with Dr. Lynetta Mare.  Rosalin Hawking, MD PhD Stroke Neurology 10/10/2020 2:12 PM    To contact Stroke Continuity provider, please refer to [email or web address] After hours, contact General Neurology

## 2020-10-10 NOTE — Progress Notes (Signed)
Occupational Therapy Treatment Patient Details Name: Fernando [name] MRN: [id number] DOB: [birth year] Today's Date: 10/10/2020    History of present illness The pt is a 86 yo male presenting with L sided weakness, aphasia, and AMS. Upon work-up, found to have large R MCA stenosis and is now s/p stenting. Pt intubated for procedure 11/12 and extubated 11/13. PMH includes: a fib on Xarelto, CAD, HTN, HLD, pacemaker 10/2012.   OT comments  Pt with gradual progress towards OT goals, presents supine in bed pleasant and willing to participate in session. He continues to require two person assist for safe completion of mobility tasks, tolerating sit<>stand trials and stand pivot to recliner today. Pt easily fatigues and requires rest breaks throughout. He remains with cognitive deficits and requiring cues for safety, increased time for processing throughout. Feel he remains a great candidate for CIR level therapies at time of discharge. Will continue to follow acutely.   Follow Up Recommendations  CIR;Supervision/Assistance - 24 hour    Equipment Recommendations  None recommended by OT          Precautions / Restrictions Precautions Precautions: Fall Precaution Comments: rectal tube Restrictions Weight Bearing Restrictions: No       Mobility Bed Mobility Overal bed mobility: Needs Assistance Bed Mobility: Rolling;Sidelying to Sit Rolling: Mod assist Sidelying to sit: Mod assist;+2 for physical assistance;HOB elevated       General bed mobility comments: Step by step cues for sequencing and to reach for rail with BUEs, assist with LLE and trunk to get to EOB as well as scooting bottom forward.  Transfers Overall transfer level: Needs assistance Equipment used: Rolling walker (2 wheeled) Transfers: Sit to/from Stand Sit to Stand: Mod assist;+2 physical assistance Stand pivot transfers: Max assist;+2 physical assistance;+2 safety/equipment       General transfer comment: Assist  of 2 to stand from EOB x4 with cues for hand placement, pt with left lateral lean and poor hip extension/upright posture. Needs to manually place right hand on walker handle. Able to take a few steps to get to chair with Max A of 2- assist with RW management, weight shifting and stepping sequence with posterior lean.    Balance Overall balance assessment: Needs assistance Sitting-balance support: Feet supported;Single extremity supported Sitting balance-Leahy Scale: Fair Sitting balance - Comments: Min guard for static sitting balance.   Standing balance support: During functional activity Standing balance-Leahy Scale: Poor Standing balance comment: Requires UE support and external support for standing balance.                           ADL either performed or assessed with clinical judgement   ADL Overall ADL's : Needs assistance/impaired     Grooming: Minimal assistance;Sitting       Lower Body Bathing: Maximal assistance;+2 for physical assistance;+2 for safety/equipment;Sit to/from stand                       Functional mobility during ADLs: Maximal assistance;+2 for physical assistance;+2 for safety/equipment;Rolling walker (modA +2 stand, maxA+2 pivot )                 Cognition Arousal/Alertness: Awake/alert Behavior During Therapy: WFL for tasks assessed/performed Overall Cognitive Status: Impaired/Different from baseline Area of Impairment: Following commands;Safety/judgement;Problem solving;Orientation                 Orientation Level: Disoriented to;Situation;Time     Following Commands: Follows one step commands with  increased time Safety/Judgement: Decreased awareness of safety;Decreased awareness of deficits   Problem Solving: Slow processing;Requires verbal cues;Requires tactile cues General Comments: "I had a heart attack" Able to recall that his daughter helped him stand up yesterday. Increased time to follow commands,  repetition needed at times due to [facility].        Exercises     Shoulder Instructions       General Comments Pt with episodes of vtach and PVcs during session, pt asymptomatic.    Pertinent Vitals/ Pain       Pain Assessment: No/denies pain  Home Living                                          Prior Functioning/Environment              Frequency  Min 2X/week        Progress Toward Goals  OT Goals(current goals can now be found in the care plan section)  Progress towards OT goals: Progressing toward goals  Acute Rehab OT Goals Patient Stated Goal: home with his family OT Goal Formulation: With patient Time For Goal Achievement: 10/21/20 Potential to Achieve Goals: Good ADL Goals Pt Will Perform Grooming: with mod assist;standing Pt Will Perform Upper Body Bathing: with min assist;sitting Pt Will Perform Lower Body Bathing: with mod assist;sitting/lateral leans;sit to/from stand Pt Will Transfer to Toilet: with mod assist;squat pivot transfer;bedside commode Pt/caregiver will Perform Home Exercise Program: Increased strength;Left upper extremity;With written HEP provided Additional ADL Goal #1: Pt will locate and grasp 3/5 ADL items with LUE in L environment with min VC's Additional ADL Goal #2: Pt will follow multi step commands during ADL with min VC's  Plan Discharge plan remains appropriate    Co-evaluation    PT/OT/SLP Co-Evaluation/Treatment: Yes Reason for Co-Treatment: For patient/therapist safety;To address functional/ADL transfers PT goals addressed during session: Mobility/safety with mobility;Balance;Strengthening/ROM OT goals addressed during session: ADL's and self-care;Strengthening/ROM      AM-PAC OT "6 Clicks" Daily Activity     Outcome Measure   Help from another person eating meals?: A Lot Help from another person taking care of personal grooming?: A Little Help from another person toileting, which includes using toliet,  bedpan, or urinal?: Total Help from another person bathing (including washing, rinsing, drying)?: A Lot Help from another person to put on and taking off regular upper body clothing?: A Lot Help from another person to put on and taking off regular lower body clothing?: Total 6 Click Score: 11    End of Session Equipment Utilized During Treatment: Gait belt;Rolling walker  OT Visit Diagnosis: Unsteadiness on feet (R26.81);Low vision, both eyes (H54.2);Muscle weakness (generalized) (M62.81);Other symptoms and signs involving cognitive function   Activity Tolerance Patient tolerated treatment well   Patient Left in chair;with call bell/phone within reach;with chair alarm set   Nurse Communication Mobility status        Time: 1030-1056 OT Time Calculation (min): 26 min  Charges: OT General Charges $OT Visit: 1 Visit OT Treatments $Self Care/Home Management : 8-22 mins  Lou Cal, OT Acute Rehabilitation Services Pager [phone] Office [phone]   Raymondo Band 10/10/2020, 12:52 PM

## 2020-10-10 NOTE — Progress Notes (Signed)
°  Echocardiogram Echocardiogram Transesophageal has been performed.  Fernando [name] 10/10/2020, 1:38 PM

## 2020-10-10 NOTE — Interval H&P Note (Signed)
History and Physical Interval Note:  10/10/2020 12:20 PM  Fernando [name]  has presented today for surgery, with the diagnosis of BACTEREMIA.  The various methods of treatment have been discussed with the patient and family. After consideration of risks, benefits and other options for treatment, the patient has consented to  Procedure(s): TRANSESOPHAGEAL ECHOCARDIOGRAM (TEE) (N/A) as a surgical intervention.  The patient's history has been reviewed, patient examined, no change in status, stable for surgery.  I have reviewed the patient's chart and labs.  Questions were answered to the patient's satisfaction.     Kirk Ruths

## 2020-10-10 NOTE — Transfer of Care (Signed)
Immediate Anesthesia Transfer of Care Note  Patient: Fernando [name]  Procedure(s) Performed: TRANSESOPHAGEAL ECHOCARDIOGRAM (TEE) (N/A )  Patient Location: Endoscopy Unit  Anesthesia Type:MAC  Level of Consciousness: drowsy  Airway & Oxygen Therapy: Patient Spontanous Breathing and Patient connected to nasal cannula oxygen  Post-op Assessment: Report given to RN, Post -op Vital signs reviewed and stable and Patient moving all extremities  Post vital signs: Reviewed and stable  Last Vitals:  Vitals Value Taken Time  BP    Temp    Pulse    Resp    SpO2      Last Pain:  Vitals:   10/10/20 1223  TempSrc: Oral  PainSc: 0-No pain         Complications: No complications documented.

## 2020-10-10 NOTE — Progress Notes (Signed)
Physical Therapy Treatment Patient Details Name: Fernando [name] MRN: [id number] DOB: 05/12/1927 Today's Date: 10/10/2020    History of Present Illness The pt is a 86 yo male presenting with L sided weakness, aphasia, and AMS. Upon work-up, found to have large R MCA stenosis and is now s/p stenting. Pt intubated for procedure 11/12 and extubated 11/13. PMH includes: a fib on Xarelto, CAD, HTN, HLD, pacemaker 10/2012.    PT Comments    Patient progressing slowly towards PT goals. Requires Mod A for bed mobility and Mod A of 2 to stand from EOB with use of RW.  Pt with left lateral lean and posterior bias through hips. Able to take a few steps to get to chair with Max A of 2 - assist with weight shifting, RW management and sequencing. Pt with 2/4 DOE. Noted to have episode of vtach and PVCs with activity but asymptomatic. Sp02 remained in mid 90s on RA throughout session. Will continue to follow and progress as tolerated.   Follow Up Recommendations  CIR     Equipment Recommendations  Other (comment) (defer to post acute)    Recommendations for Other Services       Precautions / Restrictions Precautions Precautions: Fall Precaution Comments: rectal tube Restrictions Weight Bearing Restrictions: No    Mobility  Bed Mobility Overal bed mobility: Needs Assistance Bed Mobility: Rolling;Sidelying to Sit Rolling: Mod assist Sidelying to sit: Mod assist;+2 for physical assistance;HOB elevated       General bed mobility comments: Step by step cues for sequencing and to reach for rail with BUEs, assist with LLE and trunk to get to EOB as well as scooting bottom forward.  Transfers Overall transfer level: Needs assistance Equipment used: Rolling walker (2 wheeled) Transfers: Sit to/from Stand Sit to Stand: Mod assist;+2 physical assistance Stand pivot transfers: Max assist;+2 physical assistance;+2 safety/equipment       General transfer comment: Assist of 2 to stand from EOB  x4 with cues for hand placement, pt with left lateral lean and poor hip extension/upright posture. Needs to manually place right hand on walker handle. Able to take a few steps to get to chair with Max A of 2- assist with RW management, weight shifting and stepping sequence with posterior lean.  Ambulation/Gait Ambulation/Gait assistance: Max assist;+2 physical assistance Gait Distance (Feet): 3 Feet Assistive device: Rolling walker (2 wheeled) Gait Pattern/deviations: Step-to pattern;Decreased stride length;Shuffle;Leaning posteriorly;Trunk flexed   Gait velocity interpretation: <1.31 ft/sec, indicative of household ambulator General Gait Details: Difficulty initiating stepping sequencing despite assist witih weight shifting, RW management and balance. 2/4 DOE. Sp02 remained in mid 90s on RA.   Stairs             Wheelchair Mobility    Modified Rankin (Stroke Patients Only) Modified Rankin (Stroke Patients Only) Pre-Morbid Rankin Score: Moderately severe disability Modified Rankin: Severe disability     Balance Overall balance assessment: Needs assistance Sitting-balance support: Feet supported;Single extremity supported Sitting balance-Leahy Scale: Fair Sitting balance - Comments: Min guard for static sitting balance.   Standing balance support: During functional activity Standing balance-Leahy Scale: Poor Standing balance comment: Requires UE support and external support for standing balance.                            Cognition Arousal/Alertness: Awake/alert Behavior During Therapy: WFL for tasks assessed/performed Overall Cognitive Status: Impaired/Different from baseline Area of Impairment: Following commands;Safety/judgement;Problem solving;Orientation  Orientation Level: Disoriented to;Situation;Time     Following Commands: Follows one step commands with increased time Safety/Judgement: Decreased awareness of safety;Decreased  awareness of deficits   Problem Solving: Slow processing;Requires verbal cues;Requires tactile cues General Comments: "I had a heart attack" Able to recall that his daughter helped him stand up yesterday. Increased time to follow commands, repetition needed at times due to [facility].      Exercises      General Comments General comments (skin integrity, edema, etc.): Pt with episodes of vtach and PVcs during session, pt asymptomatic.      Pertinent Vitals/Pain Pain Assessment: No/denies pain    Home Living                      Prior Function            PT Goals (current goals can now be found in the care plan section) Progress towards PT goals: Progressing toward goals    Frequency    Min 4X/week      PT Plan Current plan remains appropriate    Co-evaluation PT/OT/SLP Co-Evaluation/Treatment: Yes Reason for Co-Treatment: To address functional/ADL transfers;For patient/therapist safety PT goals addressed during session: Mobility/safety with mobility;Balance;Strengthening/ROM        AM-PAC PT "6 Clicks" Mobility   Outcome Measure  Help needed turning from your back to your side while in a flat bed without using bedrails?: A Lot Help needed moving from lying on your back to sitting on the side of a flat bed without using bedrails?: A Lot Help needed moving to and from a bed to a chair (including a wheelchair)?: Total Help needed standing up from a chair using your arms (e.g., wheelchair or bedside chair)?: A Lot Help needed to walk in hospital room?: Total Help needed climbing 3-5 steps with a railing? : Total 6 Click Score: 9    End of Session Equipment Utilized During Treatment: Gait belt Activity Tolerance: Patient limited by fatigue;Patient tolerated treatment well Patient left: with call bell/phone within reach;in chair;with chair alarm set Nurse Communication: Mobility status;Need for lift equipment (recommend use of stedy) PT Visit Diagnosis:  Unsteadiness on feet (R26.81);Muscle weakness (generalized) (M62.81);Other abnormalities of gait and mobility (R26.89);Hemiplegia and hemiparesis Hemiplegia - Right/Left: Left Hemiplegia - dominant/non-dominant: Non-dominant Hemiplegia - caused by: Other cerebrovascular disease     Time: 1032-1056 PT Time Calculation (min) (ACUTE ONLY): 24 min  Charges:  $Therapeutic Activity: 8-22 mins                     Marisa Severin, PT, DPT Acute Rehabilitation Services Pager [phone] Office [phone]       Marguarite Arbour A Sabra Heck 10/10/2020, 11:22 AM

## 2020-10-11 DIAGNOSIS — I4821 Permanent atrial fibrillation: Secondary | ICD-10-CM | POA: Diagnosis not present

## 2020-10-11 DIAGNOSIS — I2510 Atherosclerotic heart disease of native coronary artery without angina pectoris: Secondary | ICD-10-CM | POA: Diagnosis not present

## 2020-10-11 DIAGNOSIS — R7881 Bacteremia: Secondary | ICD-10-CM | POA: Diagnosis not present

## 2020-10-11 LAB — CBC
HCT: 32.2 % — ABNORMAL LOW (ref 39.0–52.0)
Hemoglobin: 9.8 g/dL — ABNORMAL LOW (ref 13.0–17.0)
MCH: 23.1 pg — ABNORMAL LOW (ref 26.0–34.0)
MCHC: 30.4 g/dL (ref 30.0–36.0)
MCV: 75.8 fL — ABNORMAL LOW (ref 80.0–100.0)
Platelets: 217 10*3/uL (ref 150–400)
RBC: 4.25 MIL/uL (ref 4.22–5.81)
RDW: 18.7 % — ABNORMAL HIGH (ref 11.5–15.5)
WBC: 8.9 10*3/uL (ref 4.0–10.5)
nRBC: 1.5 % — ABNORMAL HIGH (ref 0.0–0.2)

## 2020-10-11 LAB — GLUCOSE, CAPILLARY
Glucose-Capillary: 104 mg/dL — ABNORMAL HIGH (ref 70–99)
Glucose-Capillary: 112 mg/dL — ABNORMAL HIGH (ref 70–99)
Glucose-Capillary: 123 mg/dL — ABNORMAL HIGH (ref 70–99)
Glucose-Capillary: 88 mg/dL (ref 70–99)
Glucose-Capillary: 92 mg/dL (ref 70–99)
Glucose-Capillary: 95 mg/dL (ref 70–99)

## 2020-10-11 MED ORDER — LOPERAMIDE HCL 2 MG PO CAPS
2.0000 mg | ORAL_CAPSULE | Freq: Three times a day (TID) | ORAL | Status: DC | PRN
  Filled 2020-10-11: qty 1

## 2020-10-11 MED ORDER — ASPIRIN EC 81 MG PO TBEC
81.0000 mg | DELAYED_RELEASE_TABLET | Freq: Every day | ORAL | Status: DC
  Administered 2020-10-11 – 2020-10-14 (×4): 81 mg via ORAL
  Filled 2020-10-11 (×4): qty 1

## 2020-10-11 MED ORDER — CEFAZOLIN SODIUM-DEXTROSE 2-4 GM/100ML-% IV SOLN
2.0000 g | Freq: Three times a day (TID) | INTRAVENOUS | Status: DC
  Administered 2020-10-11 – 2020-10-14 (×8): 2 g via INTRAVENOUS
  Filled 2020-10-11 (×8): qty 100

## 2020-10-11 MED ORDER — RIVAROXABAN 15 MG PO TABS
15.0000 mg | ORAL_TABLET | Freq: Every day | ORAL | Status: DC
  Administered 2020-10-11: 15 mg via ORAL
  Filled 2020-10-11 (×2): qty 1

## 2020-10-11 MED ORDER — POTASSIUM CHLORIDE 20 MEQ PO PACK
40.0000 meq | PACK | Freq: Two times a day (BID) | ORAL | Status: AC
  Administered 2020-10-11 (×2): 40 meq via ORAL
  Filled 2020-10-11 (×2): qty 2

## 2020-10-11 NOTE — Progress Notes (Addendum)
Progress Note  Patient Name: Fernando [name] Date of Encounter: 10/11/2020  [facility] Cardiologist: No primary care provider on file.   Subjective   TEE showed LVEF 45-50%, no LAA thrombus, no vegetations. Patient has not complaints this morning. Denies chest pain. States breathing is the same.   Inpatient Medications    Scheduled Meds: . aspirin EC  325 mg Oral Daily  . atorvastatin  20 mg Oral Daily  . Chlorhexidine Gluconate Cloth  6 each Topical Daily  . enoxaparin (LOVENOX) injection  30 mg Subcutaneous Q24H  . insulin aspart  0-9 Units Subcutaneous Q4H  . levothyroxine  50 mcg Oral Q0600  . mouth rinse  15 mL Mouth Rinse BID  . metoprolol succinate  25 mg Oral Daily  . potassium chloride  40 mEq Oral Once   Continuous Infusions: . sodium chloride 30 mL/hr at 10/10/20 1445  . cefTRIAXone (ROCEPHIN)  IV Stopped (10/10/20 2326)  . norepinephrine (LEVOPHED) Adult infusion Stopped (10/10/20 0008)   PRN Meds: acetaminophen **OR** acetaminophen   Vital Signs    Vitals:   10/11/20 0300 10/11/20 0400 10/11/20 0500 10/11/20 0600  BP: 136/77 129/74 (!) 145/80 (!) 147/58  Pulse: 66 63 72 69  Resp: (!) 21 14 (!) 25 (!) 23  Temp:  98.7 F (37.1 C)    TempSrc:  Oral    SpO2: 97% (!) 83% 95% 98%  Weight:      Height:        Intake/Output Summary (Last 24 hours) at 10/11/2020 0729 Last data filed at 10/11/2020 0600 Gross per 24 hour  Intake 269.63 ml  Output 925 ml  Net -655.37 ml   Last 3 Weights 10/06/2020 07/02/2020 05/08/2020  Weight (lbs) 143 lb 143 lb 14.4 oz 130 lb  Weight (kg) 64.864 kg 65.273 kg 58.968 kg      Telemetry    V-paced, intermittent arf, HR 60-70s - Personally Reviewed  ECG    No new - Personally Reviewed  Physical Exam   GEN: No acute distress.   Neck: No JVD Cardiac: RRR, no murmurs, rubs, or gallops.  Respiratory: Clear to auscultation bilaterally. GI: Soft, nontender, non-distended  MS: No edema; No deformity. Neuro:   Nonfocal  Psych: Normal affect   Labs    High Sensitivity Troponin:  No results for input(s): TROPONINIHS in the last 720 hours.    Chemistry Recent Labs  Lab 10/06/20 1042 10/06/20 1738 10/09/20 1112 10/10/20 0340 10/10/20 1835  NA 138   < > 150* 146* 146*  K 3.8   < > 3.3* 2.9* 3.3*  CL 102   < > 118* 119* 117*  CO2 20*   < > 17* 18* 18*  GLUCOSE 172*   < > 118* 115* 104*  BUN 37*   < > 41* 44* 39*  CREATININE 1.49*   < > 1.35* 1.13 1.04  CALCIUM 9.1   < > 8.4* 7.9* 8.1*  PROT 7.8  --   --   --   --   ALBUMIN 3.7  --   --   --   --   AST 29  --   --   --   --   ALT 18  --   --   --   --   ALKPHOS 62  --   --   --   --   BILITOT 1.0  --   --   --   --   GFRNONAA 44*   < >  50* >60 >60  ANIONGAP 16*   < > 15 9 11    < > = values in this interval not displayed.     Hematology Recent Labs  Lab 10/07/20 0539 10/08/20 0225 10/09/20 1112  WBC 17.1* 17.7* 15.2*  RBC 3.42* 3.62* 3.81*  HGB 7.9* 8.2* 8.8*  HCT 25.6* 27.5* 29.0*  MCV 74.9* 76.0* 76.1*  MCH 23.1* 22.7* 23.1*  MCHC 30.9 29.8* 30.3  RDW 17.9* 18.4* 18.6*  PLT 196 228 262    BNP Recent Labs  Lab 10/06/20 1042  BNP 1,097.0*     DDimer No results for input(s): DDIMER in the last 168 hours.   Radiology    DG Swallowing Func-Speech Pathology  Result Date: 10/09/2020 Completed by Greggory Keen, SLP student Supervised and reviewed by Herbie Baltimore MA CCC-SLP Objective Swallowing Evaluation: Type of Study: MBS-Modified Barium Swallow Study  Patient Details Name: Fernando [name] MRN: [id number] Date of Birth: [birth year] Today's Date: 10/09/2020 Time: SLP Start Time (ACUTE ONLY): 0900 -SLP Stop Time (ACUTE ONLY): 0931 SLP Time Calculation (min) (ACUTE ONLY): 31 min Past Medical History: Past Medical History: Diagnosis Date . Arthritis  . Atrial fibrillation ([place])  . Carotid artery disease ([place])   Dr. Kellie Simmering - s/p left CEA . Coronary artery disease  . Essential hypertension, benign  . Hyperlipidemia  . 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 This mass was initially felt to be 1.7 cm at the time of presentation but had enlarged significantly to 4 cm clinically at the time of surgery. Pathologically it was 3.4 cm. A lymph node deep to the parotid was enlarged clinically at the time of surgery but was negative  . Tachycardia-bradycardia syndrome [facility])  Past Surgical History: Past Surgical History: Procedure Laterality Date . CAROTID ENDARTERECTOMY Left Jan. 3, 2009  CE . CATARACT EXTRACTION W/ INTRAOCULAR LENS IMPLANT    Bilateral . ESOPHAGOGASTRODUODENOSCOPY N/A 03/29/2018  Procedure: ESOPHAGOGASTRODUODENOSCOPY (EGD);  Surgeon: Danie Binder, MD;  [facility];  Service: Endoscopy;  Laterality: N/A; . EYE SURGERY   . INSERT / REPLACE / REMOVE PACEMAKER   . IR PERCUTANEOUS ART THROMBECTOMY/INFUSION INTRACRANIAL INC DIAG ANGIO  10/06/2020 . IR US GUIDE VASC ACCESS LEFT  10/06/2020 . IR US GUIDE VASC ACCESS RIGHT  10/06/2020 . IR US GUIDE VASC ACCESS RIGHT  10/06/2020 . PACEMAKER INSERTION  10-01-2012 . PARATHYROIDECTOMY  10/28/2012 . PAROTIDECTOMY  10/28/2012  Procedure: PAROTIDECTOMY;  Surgeon: Ascencion Dike, MD;  Location: [place];  Service: ENT;  Laterality: Right;  Total Parotidectomy . PERMANENT PACEMAKER INSERTION N/A 10/01/2012  Procedure: PERMANENT PACEMAKER INSERTION;  Surgeon: Evans Lance, MD;  Location: [facility] CATH LAB;  Service: Cardiovascular;  Laterality: N/A; . RADIOLOGY WITH ANESTHESIA N/A 10/07/2020  Procedure: MRI WITH ANESTHESIA;  Surgeon: Radiologist, Medication, MD;  Location: [place];  Service: Radiology;  Laterality: N/A; . RADIOLOGY WITH ANESTHESIA N/A 10/06/2020  Procedure: IR WITH ANESTHESIA;  Surgeon: Luanne Bras, MD;  Location: [place];  Service: Radiology;  Laterality: N/A; HPI: The pt is a 86 yo male presenting with L sided weakness and AMS. Upon  work-up, found to have large R MCA stenosis and is now s/p stenting. Pt intubated for procedure 11/12 and extubated 11/13. PMH includes: a fib on Xarelto, CAD, HTN, HLD, parotidectomy 10/2012, pacemaker 10/2012.  Subjective: Pt was pleasant and cooperative Assessment / Plan / Recommendation CHL IP CLINICAL IMPRESSIONS 10/09/2020  Clinical Impression Pt was seen for MBS which revealed a moderate oropharyngeal dysphagia, suspected due to baseline structural abnormalities of the cervical spine exacerbated by weakness. Pt's dysphagia is characterized by premature spillage and reduced epiglottic deflection, causing instances of penetration with liquids. The pt demonstrates moderate oral weakness, and reduced left facial ROM resulted in difficulty creating an adequate labial seal for cup sips. However, pt could acheive labial seal for straw sips. Premature spillage of thin liquids fell to the pyriforms, resulting in a moderate amount of material penetrating above the level of the vocal folds prior to initiating the swallow. The pt also demonstrated reduced epiglottic deflection, causing several instances of penetration of thin, nectar, and honey thick liquid during the swallow. When cued to produce a cough, pt would successfully eject the material from the airway and independently produce an aditional swallow in order to fully clear it. Despite consistent penetration of liquids, no aspiration was observed. Mild pharyngeal residue was also observed in the valleculae and pyriforms across POs, but most consistently with puree. Reduced UES opening was observed secondary to appearance of cervical lordosis. Recommend dys 1 diet with thin liquids. Pt should follow solids with liquids and produce frequent volitional coughing after POs. SLP will continue to f/u acutely for diet toleration and advancement.  SLP Visit Diagnosis Dysphagia, oropharyngeal phase (R13.12) Attention and concentration deficit following -- Frontal lobe and  executive function deficit following -- Impact on safety and function Moderate aspiration risk   CHL IP TREATMENT RECOMMENDATION 10/09/2020 Treatment Recommendations Therapy as outlined in treatment plan below   Prognosis 10/09/2020 Prognosis for Safe Diet Advancement Fair Barriers to Reach Goals -- Barriers/Prognosis Comment -- CHL IP DIET RECOMMENDATION 10/09/2020 SLP Diet Recommendations Dysphagia 1 (Puree) solids;Thin liquid Liquid Administration via Straw Medication Administration Crushed with puree Compensations Hard cough after swallow;Follow solids with liquid Postural Changes Seated upright at 90 degrees   CHL IP OTHER RECOMMENDATIONS 10/09/2020 Recommended Consults -- Oral Care Recommendations Oral care BID Other Recommendations --   CHL IP FOLLOW UP RECOMMENDATIONS 10/09/2020 Follow up Recommendations Inpatient Rehab   CHL IP FREQUENCY AND DURATION 10/09/2020 Speech Therapy Frequency (ACUTE ONLY) min 2x/week Treatment Duration 2 weeks      CHL IP ORAL PHASE 10/09/2020 Oral Phase Impaired Oral - Pudding Teaspoon -- Oral - Pudding Cup -- Oral - Honey Teaspoon Weak lingual manipulation;Lingual/palatal residue Oral - Honey Cup -- Oral - Nectar Teaspoon -- Oral - Nectar Cup -- Oral - Nectar Straw Lingual/palatal residue;Weak lingual manipulation Oral - Thin Teaspoon -- Oral - Thin Cup -- Oral - Thin Straw Premature spillage;Left anterior bolus loss;Right anterior bolus loss;Weak lingual manipulation Oral - Puree Weak lingual manipulation Oral - Mech Soft -- Oral - Regular -- Oral - Multi-Consistency -- Oral - Pill -- Oral Phase - Comment --  CHL IP PHARYNGEAL PHASE 10/09/2020 Pharyngeal Phase Impaired Pharyngeal- Pudding Teaspoon -- Pharyngeal -- Pharyngeal- Pudding Cup -- Pharyngeal -- Pharyngeal- Honey Teaspoon Reduced epiglottic inversion;Penetration/Aspiration during swallow;Pharyngeal residue - valleculae;Pharyngeal residue - pyriform Pharyngeal Material enters airway, remains ABOVE vocal cords then  ejected out Pharyngeal- Honey Cup -- Pharyngeal -- Pharyngeal- Nectar Teaspoon -- Pharyngeal -- Pharyngeal- Nectar Cup -- Pharyngeal -- Pharyngeal- Nectar Straw Penetration/Aspiration during swallow;Reduced epiglottic inversion;Pharyngeal residue - valleculae;Pharyngeal residue - pyriform Pharyngeal Material enters airway, remains ABOVE vocal cords then ejected out Pharyngeal- Thin Teaspoon -- Pharyngeal -- Pharyngeal- Thin Cup -- Pharyngeal -- Pharyngeal- Thin Straw Penetration/Aspiration before swallow;Penetration/Aspiration during swallow;Pharyngeal residue - valleculae;Pharyngeal residue - pyriform;Reduced epiglottic inversion Pharyngeal Material  enters airway, remains ABOVE vocal cords then ejected out Pharyngeal- Puree Pharyngeal residue - valleculae;Pharyngeal residue - pyriform;Reduced epiglottic inversion Pharyngeal -- Pharyngeal- Mechanical Soft -- Pharyngeal -- Pharyngeal- Regular -- Pharyngeal -- Pharyngeal- Multi-consistency -- Pharyngeal -- Pharyngeal- Pill -- Pharyngeal -- Pharyngeal Comment --  No flowsheet data found. DeBlois, Katherene Ponto 10/09/2020, 2:16 PM              ECHO TEE  Result Date: 10/10/2020    TRANSESOPHOGEAL ECHO REPORT   Patient Name:   Fernando [name] Date of Exam: 10/10/2020 Medical Rec #:  [id number]      Height:       65.0 in Accession #:    [id number]     Weight:       143.0 lb Date of Birth:  03-18-1927     BSA:          1.715 m Patient Age:    89 years       BP:           166/70 mmHg Patient Gender: M              HR:           66 bpm. Exam Location:  Inpatient Procedure: Transesophageal Echo, Color Doppler and Cardiac Doppler Indications:     Bacteremia 790.7 / R78.81  History:         Patient has prior history of Echocardiogram examinations, most                  recent 10/07/2020. CAD, Arrythmias:Atrial Fibrillation; Risk                  Factors:Dyslipidemia and Former Smoker.  Sonographer:     Vickie Epley RDCS Referring Phys:  [id number] [place] Diagnosing  Phys: Kirk Ruths MD PROCEDURE: The transesophogeal probe was passed without difficulty through the esophogus of the patient. Sedation performed by different physician. The patient was monitored while under deep sedation. Anesthestetic sedation was provided intravenously by Anesthesiology: 113.07mg  of Propofol. The patient developed no complications during the procedure. IMPRESSIONS  1. Akinesis of the distal septum and apex; overall mild LV dysfunction; no vegetations.  2. Left ventricular ejection fraction, by estimation, is 45 to 50%. The left ventricle has mildly decreased function. The left ventricle demonstrates regional wall motion abnormalities (see scoring diagram/findings for description).  3. Right ventricular systolic function is normal. The right ventricular size is normal.  4. Left atrial size was severely dilated. No left atrial/left atrial appendage thrombus was detected.  5. Right atrial size was moderately dilated.  6. The mitral valve is abnormal. Mild mitral valve regurgitation.  7. Tricuspid valve regurgitation is moderate to severe.  8. The aortic valve is tricuspid. Aortic valve regurgitation is mild.  9. There is Moderate (Grade III) plaque involving the descending aorta. FINDINGS  Left Ventricle: Left ventricular ejection fraction, by estimation, is 45 to 50%. The left ventricle has mildly decreased function. The left ventricle demonstrates regional wall motion abnormalities. The left ventricular internal cavity size was normal in size. Right Ventricle: The right ventricular size is normal.Right ventricular systolic function is normal. Left Atrium: Left atrial size was severely dilated. Spontaneous echo contrast was present. No left atrial/left atrial appendage thrombus was detected. Right Atrium: Right atrial size was moderately dilated. Pericardium: There is no evidence of pericardial effusion. Mitral Valve: The mitral valve is abnormal. There is mild thickening of the mitral valve  leaflet(s). Mild mitral valve regurgitation. Tricuspid Valve:  The tricuspid valve is normal in structure. Tricuspid valve regurgitation is moderate to severe. Aortic Valve: The aortic valve is tricuspid. Aortic valve regurgitation is mild. Pulmonic Valve: The pulmonic valve was grossly normal. Pulmonic valve regurgitation is mild. Aorta: The aortic root is normal in size and structure. There is moderate (Grade III) plaque involving the descending aorta. IAS/Shunts: No atrial level shunt detected by color flow Doppler. Additional Comments: Akinesis of the distal septum and apex; overall mild LV dysfunction; no vegetations. A pacer wire is visualized. Kirk Ruths MD Electronically signed by Kirk Ruths MD Signature Date/Time: 10/10/2020/2:46:15 PM    Final    VAS US CAROTID  Result Date: 10/09/2020 Carotid Arterial Duplex Study Indications:       Left hemiparesis, s/p right ICA angioplasty. Risk Factors:      Hypertension, hyperlipidemia, coronary artery disease. Other Factors:     Atrial fibrillation. Comparison Study:  No prior study Performing Technologist: Maudry Mayhew MHA, RDMS, RVT, RDCS  Examination Guidelines: A complete evaluation includes B-mode imaging, spectral Doppler, color Doppler, and power Doppler as needed of all accessible portions of each vessel. Bilateral testing is considered an integral part of a complete examination. Limited examinations for reoccurring indications may be performed as noted.  Right Carotid Findings: +----------+--------+--------+--------+--------------------+-------------------+           PSV cm/sEDV cm/sStenosisPlaque Description  Comments            +----------+--------+--------+--------+--------------------+-------------------+ CCA Prox  83      22                                                      +----------+--------+--------+--------+--------------------+-------------------+ CCA Distal100     27              calcific                                 +----------+--------+--------+--------+--------------------+-------------------+ ICA Prox  778     320     80-99%  calcific                                +----------+--------+--------+--------+--------------------+-------------------+ ICA Mid   198     63                                  post stenotic                                                             turbulence          +----------+--------+--------+--------+--------------------+-------------------+ ICA Distal92      28                                  tortuous            +----------+--------+--------+--------+--------------------+-------------------+ ECA       71  heterogenous and                                                          calcific                                +----------+--------+--------+--------+--------------------+-------------------+ +----------+--------+-------+----------------+-------------------+           PSV cm/sEDV cmsDescribe        Arm Pressure (mmHG) +----------+--------+-------+----------------+-------------------+ [id number]            Multiphasic, WNL                    +----------+--------+-------+----------------+-------------------+ +---------+--------+---+--------+--+---------+ VertebralPSV cm/s126EDV cm/s20Antegrade +---------+--------+---+--------+--+---------+  Left Carotid Findings: +----------+--------+-------+--------+--------------------------------+--------+           PSV cm/sEDV    StenosisPlaque Description              Comments                   cm/s                                                    +----------+--------+-------+--------+--------------------------------+--------+ CCA Prox  65      12             smooth and heterogenous                  +----------+--------+-------+--------+--------------------------------+--------+ CCA Distal58      14             smooth and  heterogenous                  +----------+--------+-------+--------+--------------------------------+--------+ ICA Prox  25      7      1-39%   smooth, heterogenous and                                                  calcific                                 +----------+--------+-------+--------+--------------------------------+--------+ ICA Distal94      18                                                      +----------+--------+-------+--------+--------------------------------+--------+ ECA       65                                                              +----------+--------+-------+--------+--------------------------------+--------+ +----------+--------+--------+----------------+-------------------+           PSV cm/sEDV cm/sDescribe  Arm Pressure (mmHG) +----------+--------+--------+----------------+-------------------+ [id number]              Multiphasic, WNL                    +----------+--------+--------+----------------+-------------------+ +---------+--------+--------+----------+ VertebralPSV cm/sEDV cm/sRetrograde +---------+--------+--------+----------+   Summary: Right Carotid: Velocities in the right ICA are consistent with a 80-99%                stenosis. Left Carotid: Velocities in the left ICA are consistent with a 1-39% stenosis. Vertebrals:  Right vertebral artery demonstrates antegrade flow. Left vertebral              artery demonstrates retrograde flow. Subclavians: Normal flow hemodynamics were seen in bilateral subclavian              arteries. *See table(s) above for measurements and observations.  Electronically signed by Servando Snare MD on 10/09/2020 at 4:31:20 PM.    Final     Cardiac Studies   Echo 10/07/20 . Akinesis of the distal septum and apex; overall mildly reduced LV  function; no thrombus noted using definity.  2. Left ventricular ejection fraction, by estimation, is 40 to 45%. The  left ventricle has mildly  decreased function. The left ventricle  demonstrates regional wall motion abnormalities (see scoring  diagram/findings for description). Left ventricular  diastolic function could not be evaluated.  3. Right ventricular systolic function is normal. The right ventricular  size is normal.  4. Left atrial size was severely dilated.  5. Right atrial size was severely dilated.  6. The mitral valve is normal in structure. Mild mitral valve  regurgitation. No evidence of mitral stenosis.  7. Tricuspid valve regurgitation is moderate.  8. The aortic valve is tricuspid. Aortic valve regurgitation is mild.  Mild aortic valve stenosis.  9. The inferior vena cava is normal in size with greater than 50%  respiratory variability, suggesting right atrial pressure of 3 mmHg.   TEE 10/10/50 1. Akinesis of the distal septum and apex; overall mild LV dysfunction;  no vegetations.  2. Left ventricular ejection fraction, by estimation, is 45 to 50%. The  left ventricle has mildly decreased function. The left ventricle  demonstrates regional wall motion abnormalities (see scoring  diagram/findings for description).  3. Right ventricular systolic function is normal. The right ventricular  size is normal.  4. Left atrial size was severely dilated. No left atrial/left atrial  appendage thrombus was detected.  5. Right atrial size was moderately dilated.  6. The mitral valve is abnormal. Mild mitral valve regurgitation.  7. Tricuspid valve regurgitation is moderate to severe.  8. The aortic valve is tricuspid. Aortic valve regurgitation is mild.  9. There is Moderate (Grade III) plaque involving the descending aorta.   Patient Profile     86 y.o. male  with historytachy-brady syndrome s/p pacemakerplacement, permanent aib, carotid stenosis s/p L CEA and high grade proximal right carotid stenosis, recent TIA with left hemiparehsis and expressive aphasia in August 2021, dyslipidemia, HTN,  osteoarthritis, remote squamous cell carcinoma who is being seen for the evaluation of right MCA stroke, strep agalactiae bacteremia, and new cardiomyopathy with LVEF 40-45%.   Assessment & Plan    Sepsis/streptococcus agalactia bacteremia - off pressors - TEE showed no vegetations - abx per IM  Right MCA stroke/hx of TIA - known severe internal carotid stenosis on the right side - can likely start a/c given no IE - If started on a/c will not likely need ASA -  neurology following  Chronic systolic CHF - TTE with newly depressed LVEF 40-45% with anteroseptal akinesis vs dyskinesis in the setting of pacing. No ischemic symptoms - Given age and comorbidities no plan for ischemic work-up - Euvolemic on exam - creatinine stable. K 3.3 - metoprolol 25mg  XL - with normal creatinine can likely start ACE/ARB. Bps good.   Permanent Afib - rate controled with metoprolol - Can resume Xarelto given no IE  Tachybrady syndrome s/p PPM - TEE as above  For questions or updates, please contact [place] Please consult [email or web address] for contact info under        Signed, Cadence Ninfa Meeker, PA-C  10/11/2020, 7:29 AM    Patient seen and examined and agree with Cadence Jorene Minors as detailed above.  TEE with LVEF 45-50% with no evidence of vegetations or LA thrombus. Cr continues to improve to 1.04 off diuretics.   Exam: GEN: Sitting up comfortably in chair  Neck: No JVD Cardiac: RRR, no murmurs, rubs, or gallops.  Respiratory: Diminished at the bases GI: Soft, nontender, non-distended  MS: No edema; No deformity. Neuro:  Nonfocal  Psych: Normal affect   Plan: -TEE negative for vegetations; Xarelto resumed per neuro -ASA dose decreased to 81mg  daily given CAS -Surveillance cultures negative to date; continue ABX per primary team  -Continue metoprolol and statin -Management of stroke per Neurology team  Gwyndolyn Kaufman, MD

## 2020-10-11 NOTE — Progress Notes (Signed)
  Speech Language Pathology Treatment: Dysphagia  Patient Details Name: Fernando [name] MRN: [id number] DOB: [birth year] Today's Date: 10/11/2020 Time: 0931-1216 SLP Time Calculation (min) (ACUTE ONLY): 10 min  Assessment / Plan / Recommendation Clinical Impression  Pt was seen for dysphagia treatment. RN reported that pt demonstrated a large volume of immediate coughing following thin liquids. SLP educated RN on findings of MBS and compensatory strategies that may reduce instances of coughing. Pt was observed with thin liquid and demonstrated 2-3 instances of immediate throat clearing. Pt was encouraged to take small sips and continue to clear his throat intermittently during PO intake. SLP will f/u acutely for diet toleration/advancement and family education.   HPI HPI: The pt is a 86 yo male presenting with L sided weakness and AMS. Upon work-up, found to have large R MCA stenosis and is now s/p stenting. Pt intubated for procedure 11/12 and extubated 11/13. PMH includes: a fib on Xarelto, CAD, HTN, HLD, parotidectomy 10/2012, pacemaker 10/2012.       SLP Plan  Continue with current plan of care       Recommendations  Diet recommendations: Dysphagia 1 (puree);Thin liquid Liquids provided via: Straw Medication Administration: Crushed with puree Supervision: Full supervision/cueing for compensatory strategies;Staff to assist with self feeding Compensations: Hard cough after swallow;Follow solids with liquid                Oral Care Recommendations: Oral care QID;Staff/trained caregiver to provide oral care;Oral care prior to ice chip/H20 Follow up Recommendations: Inpatient Rehab SLP Visit Diagnosis: Dysphagia, oropharyngeal phase (R13.12) Plan: Continue with current plan of care       GO                Greggory Keen 10/11/2020, 9:28 AM

## 2020-10-11 NOTE — Progress Notes (Addendum)
Pt has pan sensitive group B strep bacteremia. TEE came back neg. Ok to optimize his ceftriaxone to cefazolin to complete a total of 10d of abx. Per Dr. Lynetta Mare  Cefazolin 2g IV q8 until 11/21  Onnie Boer, PharmD, BCIDP, AAHIVP, CPP Infectious Disease Pharmacist 10/11/2020 10:43 AM

## 2020-10-11 NOTE — Progress Notes (Signed)
Transferred patient to 3W06 with all belongings. Contacted daughter to inform of transfer.

## 2020-10-11 NOTE — Progress Notes (Signed)
Physical Therapy Treatment Patient Details Name: Fernando [name] MRN: [id number] DOB: 01/14/1927 Today's Date: 10/11/2020    History of Present Illness The pt is a 86 yo male presenting with L sided weakness, aphasia, and AMS. Upon work-up, found to have large R MCA stenosis and is now s/p stenting. Pt intubated for procedure 11/12 and extubated 11/13. PMH includes: a fib on Xarelto, CAD, HTN, HLD, pacemaker 10/2012.    PT Comments    Pt with dyspnea upon PT arrival to room, per RN this is pt baseline. Pt requiring mod-max +2 for repeated transfers at EOB today, pt limited by LE weakness specifically pt holding RLE in extension and unable to bring within BOS. PT continuing to recommend CIR, will continue to follow acutely.     Follow Up Recommendations  CIR     Equipment Recommendations  Other (comment) (defer to post acute)    Recommendations for Other Services       Precautions / Restrictions Precautions Precautions: Fall Precaution Comments: rectal tube Restrictions Weight Bearing Restrictions: No    Mobility  Bed Mobility Overal bed mobility: Needs Assistance Bed Mobility: Rolling;Sidelying to Sit Rolling: Mod assist Sidelying to sit: +2 for physical assistance;Mod assist       General bed mobility comments: Mod +2 for supine>sit for trunk elevation, LE lifting and translation over EOB, and step-wise scooting to EOB with assist of bed pad. Increased time and effort.  Transfers Overall transfer level: Needs assistance Equipment used: 2 person hand held assist Transfers: Sit to/from [facility] Sit to Stand: Max assist;+2 physical assistance;From elevated surface Stand pivot transfers: Max assist;+2 physical assistance       General transfer comment: Max +2 for power up, hip extension, and steadying via gait belt. Stands x3 from EOB, pivot to drop arm recliner towards L with LLE blocking for pivot point.  Ambulation/Gait              General Gait Details: NT - DOE 2/4, LE buckling   Stairs             Wheelchair Mobility    Modified Rankin (Stroke Patients Only) Modified Rankin (Stroke Patients Only) Pre-Morbid Rankin Score: Moderately severe disability Modified Rankin: Severe disability     Balance Overall balance assessment: Needs assistance Sitting-balance support: Feet supported;Single extremity supported Sitting balance-Leahy Scale: Fair Sitting balance - Comments: Min guard for static sitting balance.   Standing balance support: During functional activity Standing balance-Leahy Scale: Poor Standing balance comment: Requires UE support and external support for standing balance.                            Cognition Arousal/Alertness: Awake/alert Behavior During Therapy: WFL for tasks assessed/performed Overall Cognitive Status: Impaired/Different from baseline Area of Impairment: Following commands;Safety/judgement;Problem solving;Attention                   Current Attention Level: Selective   Following Commands: Follows one step commands with increased time Safety/Judgement: Decreased awareness of safety;Decreased awareness of deficits   Problem Solving: Slow processing;Requires verbal cues;Requires tactile cues;Difficulty sequencing General Comments: pt states he is in the hospital because he had a stroke. Pt requires cues to not hold breath x3 during session, especially when experiencing dyspnea. Multimodal cuing required for mobility, pt very [facility]      Exercises General Exercises - Lower Extremity Ankle Circles/Pumps: AROM;Both;15 reps;Seated Long Arc Quad: AROM;Both;10 reps;Seated Hip ABduction/ADduction: AAROM;Both;10 reps;Seated (in recliner)  General Comments General comments (skin integrity, edema, etc.): SpO2 100%, HR 78 bpm when checked, DOE 2/4 at rest      Pertinent Vitals/Pain Pain Assessment: Faces Faces Pain Scale: Hurts a little bit Pain  Location: generalized, and eyes (itching) Pain Descriptors / Indicators: Grimacing;Other (Comment) (breath holding) Pain Intervention(s): Limited activity within patient's tolerance;Monitored during session;Repositioned    Home Living                      Prior Function            PT Goals (current goals can now be found in the care plan section) Acute Rehab PT Goals PT Goal Formulation: With patient Time For Goal Achievement: 10/21/20 Potential to Achieve Goals: Good Progress towards PT goals: Progressing toward goals    Frequency    Min 4X/week      PT Plan Current plan remains appropriate    Co-evaluation              AM-PAC PT "6 Clicks" Mobility   Outcome Measure  Help needed turning from your back to your side while in a flat bed without using bedrails?: A Lot Help needed moving from lying on your back to sitting on the side of a flat bed without using bedrails?: A Lot Help needed moving to and from a bed to a chair (including a wheelchair)?: Total Help needed standing up from a chair using your arms (e.g., wheelchair or bedside chair)?: Total Help needed to walk in hospital room?: Total Help needed climbing 3-5 steps with a railing? : Total 6 Click Score: 8    End of Session Equipment Utilized During Treatment: Gait belt Activity Tolerance: Patient tolerated treatment well Patient left: with call bell/phone within reach;in chair;with chair alarm set Nurse Communication: Mobility status;Need for lift equipment (may need stedy for back to bed) PT Visit Diagnosis: Unsteadiness on feet (R26.81);Muscle weakness (generalized) (M62.81);Other abnormalities of gait and mobility (R26.89);Hemiplegia and hemiparesis Hemiplegia - Right/Left: Left Hemiplegia - dominant/non-dominant: Non-dominant Hemiplegia - caused by: Other cerebrovascular disease     Time: 1520-1548 PT Time Calculation (min) (ACUTE ONLY): 28 min  Charges:  $Therapeutic Exercise: 8-22  mins $Therapeutic Activity: 8-22 mins                     Shiven Junious E, PT Acute Rehabilitation Services Pager [phone]  Office [phone]   Khoi Hamberger D Elonda Husky 10/11/2020, 4:43 PM

## 2020-10-11 NOTE — Progress Notes (Signed)
STROKE TEAM PROGRESS NOTE   INTERVAL HISTORY His RN and daughter are at the bedside.  Pt on room air, still has exertional dyspnea.  Neuro stable, unchanged.  TEE yesterday showed no endocarditis. Resumed his Xarelto. Will continue ASA for right carotid stenosis.   OBJECTIVE Vitals:   10/11/20 1204 10/11/20 1300 10/11/20 1400 10/11/20 1532  BP: 92/70 (!) 114/94 (!) 153/65 133/70  Pulse:  71 65 72  Resp:  (!) 28 (!) 26 18  Temp:    98.6 F (37 C)  TempSrc:    Oral  SpO2:  96% 94% 99%  Weight:      Height:        CBC:  Recent Labs  Lab 10/06/20 1042 10/06/20 1738 10/08/20 0225 10/09/20 1112  WBC 17.1*   < > 17.7* 15.2*  NEUTROABS 15.0*  --   --   --   HGB 9.1*   < > 8.2* 8.8*  HCT 30.1*   < > 27.5* 29.0*  MCV 76.2*   < > 76.0* 76.1*  PLT 242   < > 228 262   < > = values in this interval not displayed.    Basic Metabolic Panel:  Recent Labs  Lab 10/09/20 1112 10/09/20 1112 10/10/20 0340 10/10/20 1835  NA 150*   < > 146* 146*  K 3.3*   < > 2.9* 3.3*  CL 118*   < > 119* 117*  CO2 17*   < > 18* 18*  GLUCOSE 118*   < > 115* 104*  BUN 41*   < > 44* 39*  CREATININE 1.35*   < > 1.13 1.04  CALCIUM 8.4*   < > 7.9* 8.1*  MG 2.1  --  2.2  --    < > = values in this interval not displayed.    Lipid Panel:     Component Value Date/Time   CHOL 87 10/07/2020 0539   TRIG 42 10/07/2020 0539   TRIG 39 10/07/2020 0539   HDL 53 10/07/2020 0539   CHOLHDL 1.6 10/07/2020 0539   VLDL 8 10/07/2020 0539   LDLCALC 26 10/07/2020 0539   HgbA1c:  Lab Results  Component Value Date   HGBA1C 5.7 (H) 10/07/2020   Urine Drug Screen:     Component Value Date/Time   LABOPIA NONE DETECTED 07/02/2020 1830   COCAINSCRNUR NONE DETECTED 07/02/2020 1830   LABBENZ NONE DETECTED 07/02/2020 1830   AMPHETMU NONE DETECTED 07/02/2020 1830   THCU NONE DETECTED 07/02/2020 1830   LABBARB NONE DETECTED 07/02/2020 1830    Alcohol Level     Component Value Date/Time   ETH <10 10/06/2020  1727    IMAGING  CT HEAD WO CONTRAST No acute finding by CT. Chronic small-vessel ischemic changes throughout the brain as outlined above.  CT Code Stroke CTA Head W/WO contrast CT Code Stroke CTA Neck W/WO contrast CT PERFUSION BRAIN 10/06/2020 IMPRESSION:  1. No emergent large vessel occlusion.  2. Persistent radiographic string sign of the proximal right ICA.  3. Delayed transit throughout the entire right cerebral hemisphere and left ACA territory related to the high-grade proximal right ICA stenosis and chronically diminutive/diseased left ACA. No core infarct identified by CTP.  4. Severe right P2 stenosis.  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  6. Bilateral ground-glass lung opacities which could reflect edema or infection. Moderate right pleural effusion.  7.  Aortic Atherosclerosis (ICD10-I70.0).   CT HEAD WO CONTRAST 10/07/2020 IMPRESSION:  No acute finding by CT. Chronic small-vessel  ischemic changes throughout the brain as outlined above.   DG Chest Port 1 View 10/06/2020 IMPRESSION:  Enlargement of cardiac silhouette with pulmonary vascular congestion. Increased interstitial markings in the mid to lower lungs question minimal edema or developing chronic interstitial lung disease.   CT HEAD CODE STROKE WO CONTRAST 10/06/2020 IMPRESSION:  1. Hyperdense Right MCA suspicious for Emergent Large Vessel Occlusion.  2. No acute cortically based infarct or acute intracranial hemorrhage identified. ASPECTS 10.  3. Stable CT appearance of brain parenchyma since August.   Transthoracic Echocardiogram  1. Akinesis of the distal septum and apex; overall mildly reduced LV  function; no thrombus noted using definity.  2. Left ventricular ejection fraction, by estimation, is 40 to 45%. The  left ventricle has mildly decreased function. The left ventricle  demonstrates regional wall motion abnormalities (see scoring  diagram/findings for description). Left ventricular   diastolic function could not be evaluated.  3. Right ventricular systolic function is normal. The right ventricular  size is normal.  4. Left atrial size was severely dilated.  5. Right atrial size was severely dilated.  6. The mitral valve is normal in structure. Mild mitral valve  regurgitation. No evidence of mitral stenosis.  7. Tricuspid valve regurgitation is moderate.  8. The aortic valve is tricuspid. Aortic valve regurgitation is mild.  Mild aortic valve stenosis.  9. The inferior vena cava is normal in size with greater than 50%  respiratory variability, suggesting right atrial pressure of 3 mmHg.    ECG - atrial fibrillation - ventricular response 78 BPM (See cardiology reading for complete details)  No results found.   PHYSICAL EXAM  Temp:  [98 F (36.7 C)-98.7 F (37.1 C)] 98.6 F (37 C) (11/17 1532) Pulse Rate:  [60-77] 72 (11/17 1532) Resp:  [14-28] 18 (11/17 1532) BP: (68-153)/(43-96) 133/70 (11/17 1532) SpO2:  [83 %-100 %] 99 % (11/17 1532)  General - Well nourished, well developed, mild SOB with exertional tachypnea  Ophthalmologic - fundi not visualized due to noncooperation.  Cardiovascular - irregularly irregular heart rate and rhythm  Neuro - eyes open, moderate to severe dysarthria but able to answer questions. Orientated to self, age, year and situation but not to month. However, able to follow simple commands, able to name and repeat but with very dysarthric and SOB with talking. Eyes able to gaze bilaterally, visual field full. PERRL. Left facial droop. Tongue protrusion midline. RUE 4/5, LUE 4-/5 without drift and finger grip and wrist extension 3/5. BLE 3-/5, able to hold knee flexion and foot on bed position but LLE weaker than left. No babinski bilaterally. Sensation subjectively symmetrical, coordination slow but grossly intact bilaterally and gait not tested.   ASSESSMENT/PLAN Mr. Ayansh Feutz is a 86 y.o. male with history of atrial  fibrillation on Xarelto, PPM, L CEA, CAD, Htn, HLD, dementia and squamous cell cancer R parotid resected 2014, presenting with left sided weakness. He did not receive IV t-PA due to anticoagulation with Xarelto. Acute right ICA angioplasty with cerebral protection 10/06/20 at 11:28 PM Dr Earleen Newport.  Stroke: R MCA infarct due to right ICA high grade stenosis s/p angioplasty, etiology likely due to large vessel disease. However, given fever and bacteremia, needs to rule out endocarditis   CT Head - Hyperdense Right MCA suspicious for Emergent Large Vessel Occlusion. No acute cortically based infarct or acute intracranial hemorrhage identified. ASPECTS 10.   CTA H&N - Persistent radiographic string sign of the proximal right ICA. Severe right P2 stenosis.  CTP - Delayed transit throughout the entire right cerebral hemisphere and left ACA territory related to the high-grade proximal right ICA stenosis and chronically diminutive/diseased left ACA. No core infarct identified by CTP.   IR - right ICA angioplasty, 86%->41% stenosis post angioplasty  CT head repeat x 2 - No acute finding by CT. Chronic small-vessel ischemic changes throughout the brain as outlined above.   2D Echo - EF 40 to 45% down from 60-65% in 06/2020, akinesis of the distal septum and apex  Carotid doppler right ICA 80-99% stenosis  Lacey Jensen Virus 2 - negative  LDL - 26  HgbA1c - 5.7  UDS - not ordered  VTE prophylaxis - Lovenox  Xarelto (rivaroxaban) daily prior to admission, Xarelto resumed. Will also continue ASA 81 given right carotid stenosis    Patient counseled to be compliant with his antithrombotic medications  Ongoing aggressive stroke risk factor management  Therapy recommendations:  CIR  Disposition:  Pending  Carotid stenosis  Hx of left ICA stenosis s/p left CEA  06/2020 CT head and neck showed right ICA bulb string sign  This admission CT head and neck again showed right ICA bulb string sign,  status post angioplasty.  Right ICA 86% -> 41% stenosis post-procedure  Carotid Doppler post angioplasty right ICA 80-99% stenosis  Discussed with Dr. Earleen Newport, given clinical improvement, no further carotid revascularization needed at this time.  Will close follow-up in the clinic.  Continue ASA   Chronic afib Tachy-brady syndrome s/p pacer Cardiomyopathy  Pulmonary edema  On Xarelto PTA  Now Xarelto resumed   2D Echo - EF 40 to 45% down from 60-65% in 06/2020, akinesis of the distal septum and apex  CXR concerning for pulmonary edema -> improved  Still has tachypnea   TEE severe LAE; spontaneous contrast in LAA but no thrombus  Cardiology on board - appreciate help  Bacteremia  Fever Leukocytosis  Tmax 101.1->afebrile  On Abx   Blood culture 2/2 group B strep - pan sensitive  WBC 17.1->17.7->15.2  TEE no endocarditis  CCM signed off  Hx of TIA  06/2020 admitted to [facility] for confusion and slurry speech with slump over and left facial droop. CTA head and neck showed right ICA bulb string sign. EF 60-65%, LDL 61 and A1C 5.9.   Hx of Hypertension Hypotension   Home BP meds: metoprolol  Current BP meds: off levophed  SBP stable now . Long-term BP goal normotensive  Hyperlipidemia  Home Lipid lowering medication: Lipitor 40 mg daily  LDL 26, goal < 70  Resume lipitor 20  Continue statin at discharge  Dysphagia   Passed barium study 11/15  On gentle IVF  On dys1 and thin  Speech on board  Other Stroke Risk Factors  Advanced age  Former cigarette smoker - quit 47 years ago  ETOH use, pt has 1 drink per week  Coronary artery disease  Other Active Problems   Code status - DNR  Aortic Atherosclerosis (ICD10-I70.0).   Acute blood loss anemia - Hgb - 8.8->7.9->8.2->8.8 CKD - stage 3b - creatinine - 1.30->1.23->1.48->1.13  Dementia - lives with son  Hypokalemia K 4.3-3.3-2.9-supplement   Hospital day # 5  This patient is critically  ill due to CHF, A. fib, status post ICD, right MCA stroke, right carotid stenosis and at significant risk of neurological worsening, death form heart failure, cardiac arrest, cardiogenic shock, respiratory failure, recurrent stroke, hemorrhagic conversion, seizure. This patient's care requires constant monitoring of vital signs, hemodynamics, respiratory and  cardiac monitoring, review of multiple databases, neurological assessment, discussion with family, other specialists and medical decision making of high complexity. I spent 35 minutes of neurocritical care time in the care of this patient. I had long discussion with daughter at bedside, updated pt current condition, treatment plan and potential prognosis, and answered all the questions.  She expressed understanding and appreciation.   Rosalin Hawking, MD PhD Stroke Neurology 10/11/2020 6:42 PM    To contact Stroke Continuity provider, please refer to [email or web address] After hours, contact General Neurology

## 2020-10-12 DIAGNOSIS — K921 Melena: Secondary | ICD-10-CM

## 2020-10-12 DIAGNOSIS — D62 Acute posthemorrhagic anemia: Secondary | ICD-10-CM

## 2020-10-12 LAB — BASIC METABOLIC PANEL
Anion gap: 8 (ref 5–15)
BUN: 30 mg/dL — ABNORMAL HIGH (ref 8–23)
CO2: 20 mmol/L — ABNORMAL LOW (ref 22–32)
Calcium: 8.1 mg/dL — ABNORMAL LOW (ref 8.9–10.3)
Chloride: 118 mmol/L — ABNORMAL HIGH (ref 98–111)
Creatinine, Ser: 1.05 mg/dL (ref 0.61–1.24)
GFR, Estimated: >60 mL/min (ref >60)
Glucose, Bld: 120 mg/dL — ABNORMAL HIGH (ref 70–99)
Potassium: 3.7 mmol/L (ref 3.5–5.1)
Sodium: 146 mmol/L — ABNORMAL HIGH (ref 135–145)

## 2020-10-12 LAB — CBC
HCT: 24.6 % — ABNORMAL LOW (ref 39.0–52.0)
Hemoglobin: 7.4 g/dL — ABNORMAL LOW (ref 13.0–17.0)
MCH: 22.8 pg — ABNORMAL LOW (ref 26.0–34.0)
MCHC: 30.1 g/dL (ref 30.0–36.0)
MCV: 75.7 fL — ABNORMAL LOW (ref 80.0–100.0)
Platelets: 229 10*3/uL (ref 150–400)
RBC: 3.25 MIL/uL — ABNORMAL LOW (ref 4.22–5.81)
RDW: 19.2 % — ABNORMAL HIGH (ref 11.5–15.5)
WBC: 9.8 10*3/uL (ref 4.0–10.5)
nRBC: 0.8 % — ABNORMAL HIGH (ref 0.0–0.2)

## 2020-10-12 LAB — GLUCOSE, CAPILLARY
Glucose-Capillary: 105 mg/dL — ABNORMAL HIGH (ref 70–99)
Glucose-Capillary: 105 mg/dL — ABNORMAL HIGH (ref 70–99)
Glucose-Capillary: 106 mg/dL — ABNORMAL HIGH (ref 70–99)
Glucose-Capillary: 116 mg/dL — ABNORMAL HIGH (ref 70–99)
Glucose-Capillary: 117 mg/dL — ABNORMAL HIGH (ref 70–99)
Glucose-Capillary: 127 mg/dL — ABNORMAL HIGH (ref 70–99)

## 2020-10-12 LAB — PREPARE RBC (CROSSMATCH)

## 2020-10-12 LAB — HEMOGLOBIN AND HEMATOCRIT, BLOOD
HCT: 29.4 % — ABNORMAL LOW (ref 39.0–52.0)
Hemoglobin: 9.2 g/dL — ABNORMAL LOW (ref 13.0–17.0)

## 2020-10-12 LAB — OCCULT BLOOD X 1 CARD TO LAB, STOOL: Fecal Occult Bld: POSITIVE — AB

## 2020-10-12 MED ORDER — PANTOPRAZOLE SODIUM 40 MG PO PACK
40.0000 mg | PACK | Freq: Two times a day (BID) | ORAL | Status: DC
  Administered 2020-10-12 – 2020-10-14 (×5): 40 mg via ORAL
  Filled 2020-10-12 (×5): qty 20

## 2020-10-12 MED ORDER — FUROSEMIDE 10 MG/ML IJ SOLN
40.0000 mg | Freq: Once | INTRAMUSCULAR | Status: AC
  Administered 2020-10-12: 40 mg via INTRAVENOUS
  Filled 2020-10-12: qty 4

## 2020-10-12 MED ORDER — SODIUM CHLORIDE 0.9% IV SOLUTION: Freq: Once | INTRAVENOUS | Status: AC

## 2020-10-12 MED ORDER — FERROUS SULFATE 325 (65 FE) MG PO TABS
325.0000 mg | ORAL_TABLET | Freq: Three times a day (TID) | ORAL | Status: DC
  Administered 2020-10-13 – 2020-10-14 (×5): 325 mg via ORAL
  Filled 2020-10-12 (×5): qty 1

## 2020-10-12 NOTE — Progress Notes (Addendum)
Progress Note  Patient Name: Fernando [name] Date of Encounter: 10/12/2020  [facility] [place] [facility] Cardiologist: No primary care provider on file.   Subjective   Patient denies chest pain. Asking for water.   Inpatient Medications    Scheduled Meds: . aspirin EC  81 mg Oral Daily  . atorvastatin  20 mg Oral Daily  . Chlorhexidine Gluconate Cloth  6 each Topical Daily  . insulin aspart  0-9 Units Subcutaneous Q4H  . levothyroxine  50 mcg Oral Q0600  . mouth rinse  15 mL Mouth Rinse BID  . metoprolol succinate  25 mg Oral Daily  . Rivaroxaban  15 mg Oral Q supper   Continuous Infusions: .  ceFAZolin (ANCEF) IV 2 g (10/12/20 0514)   PRN Meds: acetaminophen **OR** acetaminophen, loperamide   Vital Signs    Vitals:   10/11/20 2339 10/12/20 0427 10/12/20 0500 10/12/20 0819  BP: (!) 167/71 (!) 163/67  103/69  Pulse: 74 67  68  Resp: 18 18  18   Temp: 98 F (36.7 C) 97.7 F (36.5 C)  98.4 F (36.9 C)  TempSrc: Oral Oral  Oral  SpO2: 99% 99%  95%  Weight:   72.3 kg   Height:        Intake/Output Summary (Last 24 hours) at 10/12/2020 0945 Last data filed at 10/12/2020 0514 Gross per 24 hour  Intake 740 ml  Output 375 ml  Net 365 ml   Last 3 Weights 10/12/2020 10/06/2020 07/02/2020  Weight (lbs) 159 lb 6.3 oz 143 lb 143 lb 14.4 oz  Weight (kg) 72.3 kg 64.864 kg 65.273 kg      Telemetry    Afib/V-paced rhythm, HR 60-70s - Personally Reviewed  ECG    No new - Personally Reviewed  Physical Exam   GEN: No acute distress.   Neck: No JVD Cardiac: RRR, no murmurs, rubs, or gallops.  Respiratory: Clear to auscultation bilaterally. GI: Soft, nontender, non-distended  MS: No edema; No deformity. Neuro:  Dysarthria, bilateral upper extremity and lower extremity weakness (unchanged) Psych: Normal affect   Labs    High Sensitivity Troponin:  No results for input(s): TROPONINIHS in the last 720 hours.    Chemistry Recent Labs  Lab 10/06/20 1042 10/06/20 1738  10/09/20 1112 10/10/20 0340 10/10/20 1835  NA 138   < > 150* 146* 146*  K 3.8   < > 3.3* 2.9* 3.3*  CL 102   < > 118* 119* 117*  CO2 20*   < > 17* 18* 18*  GLUCOSE 172*   < > 118* 115* 104*  BUN 37*   < > 41* 44* 39*  CREATININE 1.49*   < > 1.35* 1.13 1.04  CALCIUM 9.1   < > 8.4* 7.9* 8.1*  PROT 7.8  --   --   --   --   ALBUMIN 3.7  --   --   --   --   AST 29  --   --   --   --   ALT 18  --   --   --   --   ALKPHOS 62  --   --   --   --   BILITOT 1.0  --   --   --   --   GFRNONAA 44*   < > 50* >60 >60  ANIONGAP 16*   < > 15 9 11    < > = values in this interval not displayed.     Hematology Recent Labs  Lab 10/09/20 1112 10/11/20 1846 10/12/20 0855  WBC 15.2* 8.9 9.8  RBC 3.81* 4.25 3.25*  HGB 8.8* 9.8* 7.4*  HCT 29.0* 32.2* 24.6*  MCV 76.1* 75.8* 75.7*  MCH 23.1* 23.1* 22.8*  MCHC 30.3 30.4 30.1  RDW 18.6* 18.7* 19.2*  PLT 262 217 229    BNP Recent Labs  Lab 10/06/20 1042  BNP 1,097.0*     DDimer No results for input(s): DDIMER in the last 168 hours.   Radiology    ECHO TEE  Result Date: 10/10/2020    TRANSESOPHOGEAL ECHO REPORT   Patient Name:   Fernando [name] Date of Exam: 10/10/2020 Medical Rec #:  [id number]      Height:       65.0 in Accession #:    [id number]     Weight:       143.0 lb Date of Birth:  Aug 04, 1927     BSA:          1.715 m Patient Age:    86 years       BP:           166/70 mmHg Patient Gender: M              HR:           66 bpm. Exam Location:  Inpatient Procedure: Transesophageal Echo, Color Doppler and Cardiac Doppler Indications:     Bacteremia 790.7 / R78.81  History:         Patient has prior history of Echocardiogram examinations, most                  recent 10/07/2020. CAD, Arrythmias:Atrial Fibrillation; Risk                  Factors:Dyslipidemia and Former Smoker.  Sonographer:     Vickie Epley RDCS Referring Phys:  [id number] [place] Diagnosing Phys: Kirk Ruths MD PROCEDURE: The transesophogeal probe was passed without  difficulty through the esophogus of the patient. Sedation performed by different physician. The patient was monitored while under deep sedation. Anesthestetic sedation was provided intravenously by Anesthesiology: 113.07mg  of Propofol. The patient developed no complications during the procedure. IMPRESSIONS  1. Akinesis of the distal septum and apex; overall mild LV dysfunction; no vegetations.  2. Left ventricular ejection fraction, by estimation, is 45 to 50%. The left ventricle has mildly decreased function. The left ventricle demonstrates regional wall motion abnormalities (see scoring diagram/findings for description).  3. Right ventricular systolic function is normal. The right ventricular size is normal.  4. Left atrial size was severely dilated. No left atrial/left atrial appendage thrombus was detected.  5. Right atrial size was moderately dilated.  6. The mitral valve is abnormal. Mild mitral valve regurgitation.  7. Tricuspid valve regurgitation is moderate to severe.  8. The aortic valve is tricuspid. Aortic valve regurgitation is mild.  9. There is Moderate (Grade III) plaque involving the descending aorta. FINDINGS  Left Ventricle: Left ventricular ejection fraction, by estimation, is 45 to 50%. The left ventricle has mildly decreased function. The left ventricle demonstrates regional wall motion abnormalities. The left ventricular internal cavity size was normal in size. Right Ventricle: The right ventricular size is normal.Right ventricular systolic function is normal. Left Atrium: Left atrial size was severely dilated. Spontaneous echo contrast was present. No left atrial/left atrial appendage thrombus was detected. Right Atrium: Right atrial size was moderately dilated. Pericardium: There is no evidence of pericardial effusion. Mitral Valve: The mitral valve is abnormal.  There is mild thickening of the mitral valve leaflet(s). Mild mitral valve regurgitation. Tricuspid Valve: The tricuspid valve is  normal in structure. Tricuspid valve regurgitation is moderate to severe. Aortic Valve: The aortic valve is tricuspid. Aortic valve regurgitation is mild. Pulmonic Valve: The pulmonic valve was grossly normal. Pulmonic valve regurgitation is mild. Aorta: The aortic root is normal in size and structure. There is moderate (Grade III) plaque involving the descending aorta. IAS/Shunts: No atrial level shunt detected by color flow Doppler. Additional Comments: Akinesis of the distal septum and apex; overall mild LV dysfunction; no vegetations. A pacer wire is visualized. Kirk Ruths MD Electronically signed by Kirk Ruths MD Signature Date/Time: 10/10/2020/2:46:15 PM    Final     Cardiac Studies   Echo 10/07/20 . Akinesis of the distal septum and apex; overall mildly reduced LV  function; no thrombus noted using definity.  2. Left ventricular ejection fraction, by estimation, is 40 to 45%. The  left ventricle has mildly decreased function. The left ventricle  demonstrates regional wall motion abnormalities (see scoring  diagram/findings for description). Left ventricular  diastolic function could not be evaluated.  3. Right ventricular systolic function is normal. The right ventricular  size is normal.  4. Left atrial size was severely dilated.  5. Right atrial size was severely dilated.  6. The mitral valve is normal in structure. Mild mitral valve  regurgitation. No evidence of mitral stenosis.  7. Tricuspid valve regurgitation is moderate.  8. The aortic valve is tricuspid. Aortic valve regurgitation is mild.  Mild aortic valve stenosis.  9. The inferior vena cava is normal in size with greater than 50%  respiratory variability, suggesting right atrial pressure of 3 mmHg.   TEE 10/10/50 1. Akinesis of the distal septum and apex; overall mild LV dysfunction;  no vegetations.  2. Left ventricular ejection fraction, by estimation, is 45 to 50%. The  left ventricle has mildly  decreased function. The left ventricle  demonstrates regional wall motion abnormalities (see scoring  diagram/findings for description).  3. Right ventricular systolic function is normal. The right ventricular  size is normal.  4. Left atrial size was severely dilated. No left atrial/left atrial  appendage thrombus was detected.  5. Right atrial size was moderately dilated.  6. The mitral valve is abnormal. Mild mitral valve regurgitation.  7. Tricuspid valve regurgitation is moderate to severe.  8. The aortic valve is tricuspid. Aortic valve regurgitation is mild.  9. There is Moderate (Grade III) plaque involving the descending aorta.   Patient Profile     86 y.o. male with historytachy-brady syndrome s/p pacemakerplacement, permanent aib, carotid stenosis s/p L CEA and high grade proximal right carotid stenosis, recent TIA with left hemiparehsis and expressive aphasia in August 2021, dyslipidemia, HTN, osteoarthritis, remote squamous cell carcinoma who is being seen for the evaluation of right MCA stroke, strep agalactiae bacteremia, and new cardiomyopathy with LVEF 40-45%.  Assessment & Plan   Sepsis/streptococcus agalactia bacteremia - off pressors - TEE showed no vegetations and Xarelto was resumed per neuro - abx per IM  Right MCA stroke/hx of TIA - known severe internal carotid stenosis on the right side - ASA 81mg  per neurology for CAS  Chronic systolic CHF - TTE with newly depressed LVEF 40-45% with anteroseptal akinesis vs dyskinesis in the setting of pacing. No ischemic symptoms - Given age and comorbidities no plan for ischemic work-up - Euvolemic on exam - creatinine stable. K 3.3 - metoprolol 25mg  XL - with normal  creatinine can likely start ACE/ARB however Bps labile  Permanent Afib - rate controled with metoprolol - Xarelto resumed per neuro  Tachybrady syndrome s/p PPM - TEE as above   For questions or updates, please contact [place] Please consult [email or web address] for contact info under        Signed, Cadence Ninfa Meeker, PA-C  10/12/2020, 9:45 AM    Patient seen and examined and agree with Cadence Kathlen Mody, PA-C.  Doing well this morning. Sitting in a chair. Hemoglobin dropped to 7.4 without overt signs of bleeding. Repeat H/H and FOBT pending. Patient otherwise stable.  Exam: GEN: No acute distress. Sitting in a chair   Neck: No JVD Cardiac: RRR, no murmurs Respiratory: Diminished at bases GI: Soft, nontender, non-distended  MS: No edema; No deformity. Neuro:  Dysarthria with bilateral UE/LE weakness (unchanged) Psych: Normal affect   Plan: -Follow-up repeat H/H and FOBT; no overt signs of bleeding -Continue xarelto and ASA for now -Continue maintenance diuretic -Continue PPI per Neuro -Management of bacteremia per primary team  Cardiology will sign-off, please feel free to call with questions or concerns  Gwyndolyn Kaufman, MD

## 2020-10-12 NOTE — Progress Notes (Signed)
Pt hemaoccult test resulted positive. Ruta Hinds, NP notified.

## 2020-10-12 NOTE — Progress Notes (Signed)
Physical Therapy Treatment Patient Details Name: Fernando [name] MRN: [id number] DOB: 1927/04/04 Today's Date: 10/12/2020    History of Present Illness The pt is a 86 yo male presenting with L sided weakness, aphasia, and AMS. Upon work-up, found to have large R MCA stenosis and is now s/p stenting. Pt intubated for procedure 11/12 and extubated 11/13. PMH includes: a fib on Xarelto, CAD, HTN, HLD, pacemaker 10/2012.    PT Comments    Pt very pleasant and motivated.  He does fatigue and requires short rest breaks but then is able to continue with therapy.  Pt with participation with multiple transfers and partial sit to stands.  Improved transfers and good initiation of stands but then requiring max of 2 to complete stands.  L leg strength appears weakest in hip.  Continue plan of care.    Follow Up Recommendations  CIR     Equipment Recommendations  Other (comment) (to be determined post acute)    Recommendations for Other Services Rehab consult     Precautions / Restrictions Precautions Precautions: Fall Precaution Comments: rectal tube    Mobility  Bed Mobility Overal bed mobility: Needs Assistance Bed Mobility: Supine to Sit   Sidelying to sit: Mod assist;+2 for physical assistance       General bed mobility comments: Pt requiring assist for L LE to EOB and to lift trunk; good initiation with lifting trunk but did requiring mod A and mod A to scoot forward; required cues for sequencing  Transfers Overall transfer level: Needs assistance Equipment used: 1 person hand held assist;2 person hand held assist (back of chair) Transfers: Sit to/from [facility] Sit to Stand: Max assist;+2 physical assistance;From elevated surface Stand pivot transfers: Max assist;+2 physical assistance       General transfer comment: 3 x sit to stand from elevated bed with max A of 2 to complete. Pt with mod A initially but required max x 2 to come upright and still with  difficulty tucking buttock.  Max x 2 pivot to chair toward L side  Ambulation/Gait                 Stairs             Wheelchair Mobility    Modified Rankin (Stroke Patients Only) Modified Rankin (Stroke Patients Only) Pre-Morbid Rankin Score: Moderately severe disability Modified Rankin: Severe disability     Balance Overall balance assessment: Needs assistance Sitting-balance support: Feet supported;No upper extremity supported;Single extremity supported Sitting balance-Leahy Scale: Fair Sitting balance - Comments: close supervision for static sitting; able to do without UE but preferred to have UE support     Standing balance-Leahy Scale: Zero Standing balance comment: Unable to stand completely upright and required max A x 2; performed 3 stands and 8 partial stands from chair                            Cognition Arousal/Alertness: Awake/alert Behavior During Therapy: WFL for tasks assessed/performed Overall Cognitive Status: Impaired/Different from baseline Area of Impairment: Following commands;Safety/judgement;Problem solving;Attention                       Following Commands: Follows one step commands with increased time Safety/Judgement: Decreased awareness of safety;Decreased awareness of deficits   Problem Solving: Slow processing;Requires verbal cues;Requires tactile cues;Difficulty sequencing General Comments: Pt is HOH and sometiimes difficult to assist.  Granddaughter present and reports he is seeming  closer to baaseline.  Does require increased time and multimodal cueing      Exercises General Exercises - Lower Extremity Ankle Circles/Pumps: AROM;Both;15 reps;Seated Gluteal Sets:  (could not follow commands) Long Arc Quad: Strengthening;Left;5 reps;Seated (with mod manual resistance) Hip ABduction/ADduction: Strengthening;Both;5 reps;Seated (clamshell with mod manual resistance) Hip Flexion/Marching: Strengthening;Both;5  reps;Seated (with min maunal resistance for concentric and eccentric phase) Other Exercises Other Exercises: Partial sit to stands x 5 for exercise but then required 3 more for ADLs/pad straightening: max A, feet blocked, pushed up with arms on armrest    General Comments General comments (skin integrity, edema, etc.): VSS; does have DOE of 2/4 with increased RR requiring rest breaks      Pertinent Vitals/Pain Pain Assessment: No/denies pain    Home Living                      Prior Function            PT Goals (current goals can now be found in the care plan section) Acute Rehab PT Goals Patient Stated Goal: home with his family PT Goal Formulation: With patient Time For Goal Achievement: 10/21/20 Potential to Achieve Goals: Good Progress towards PT goals: Progressing toward goals    Frequency    Min 4X/week      PT Plan Current plan remains appropriate    Co-evaluation PT/OT/SLP Co-Evaluation/Treatment: Yes Reason for Co-Treatment: Complexity of the patient's impairments (multi-system involvement);For patient/therapist safety PT goals addressed during session: Mobility/safety with mobility;Strengthening/ROM OT goals addressed during session: ADL's and self-care;Strengthening/ROM      AM-PAC PT "6 Clicks" Mobility   Outcome Measure  Help needed turning from your back to your side while in a flat bed without using bedrails?: A Lot Help needed moving from lying on your back to sitting on the side of a flat bed without using bedrails?: A Lot Help needed moving to and from a bed to a chair (including a wheelchair)?: Total Help needed standing up from a chair using your arms (e.g., wheelchair or bedside chair)?: Total Help needed to walk in hospital room?: Total Help needed climbing 3-5 steps with a railing? : Total 6 Click Score: 8    End of Session Equipment Utilized During Treatment: Gait belt Activity Tolerance: Patient tolerated treatment  well Patient left: with call bell/phone within reach;in chair;with chair alarm set;with family/visitor present Nurse Communication: Mobility status;Need for lift equipment (max x 2 pivot vs lift equipment) PT Visit Diagnosis: Unsteadiness on feet (R26.81);Muscle weakness (generalized) (M62.81);Other abnormalities of gait and mobility (R26.89);Hemiplegia and hemiparesis Hemiplegia - Right/Left: Left Hemiplegia - dominant/non-dominant: Dominant Hemiplegia - caused by: Other cerebrovascular disease     Time: 1000-1030 PT Time Calculation (min) (ACUTE ONLY): 30 min  Charges:  $Neuromuscular Re-education: 8-22 mins                     Abran Richard, PT Acute Rehab Services Pager [phone] [facility] [place] 10/12/2020, 10:43 AM

## 2020-10-12 NOTE — Anesthesia Postprocedure Evaluation (Signed)
Anesthesia Post Note  Patient: Fernando [name]  Procedure(s) Performed: TRANSESOPHAGEAL ECHOCARDIOGRAM (TEE) (N/A )     Patient location during evaluation: PACU Anesthesia Type: MAC Level of consciousness: awake and alert Pain management: pain level controlled Vital Signs Assessment: post-procedure vital signs reviewed and stable Respiratory status: spontaneous breathing Cardiovascular status: stable Anesthetic complications: no   No complications documented.  Last Vitals:  Vitals:   10/12/20 1720 10/12/20 1937  BP: (!) 164/66 (!) 143/60  Pulse: 63 62  Resp: 18 17  Temp: 37 C 36.6 C  SpO2: 100% 99%    Last Pain:  Vitals:   10/12/20 1937  TempSrc: Oral  PainSc:                  Nolon Nations

## 2020-10-12 NOTE — Progress Notes (Addendum)
STROKE TEAM PROGRESS NOTE   INTERVAL HISTORY No acute events overnight. Remains on room air with exertional dyspnea. Neuro examination is stable. PT and OT have recommended CIR.    OBJECTIVE Vitals:   10/11/20 2339 10/12/20 0427 10/12/20 0500 10/12/20 0819  BP: (!) 167/71 (!) 163/67  103/69  Pulse: 74 67  68  Resp: 18 18  18   Temp: 98 F (36.7 C) 97.7 F (36.5 C)  98.4 F (36.9 C)  TempSrc: Oral Oral  Oral  SpO2: 99% 99%  95%  Weight:   72.3 kg   Height:        CBC:  Recent Labs  Lab 10/06/20 1042 10/06/20 1738 10/11/20 1846 10/12/20 0855  WBC 17.1*   < > 8.9 9.8  NEUTROABS 15.0*  --   --   --   HGB 9.1*   < > 9.8* 7.4*  HCT 30.1*   < > 32.2* 24.6*  MCV 76.2*   < > 75.8* 75.7*  PLT 242   < > 217 229   < > = values in this interval not displayed.    Basic Metabolic Panel:  Recent Labs  Lab 10/09/20 1112 10/09/20 1112 10/10/20 0340 10/10/20 1835  NA 150*   < > 146* 146*  K 3.3*   < > 2.9* 3.3*  CL 118*   < > 119* 117*  CO2 17*   < > 18* 18*  GLUCOSE 118*   < > 115* 104*  BUN 41*   < > 44* 39*  CREATININE 1.35*   < > 1.13 1.04  CALCIUM 8.4*   < > 7.9* 8.1*  MG 2.1  --  2.2  --    < > = values in this interval not displayed.    Lipid Panel:     Component Value Date/Time   CHOL 87 10/07/2020 0539   TRIG 42 10/07/2020 0539   TRIG 39 10/07/2020 0539   HDL 53 10/07/2020 0539   CHOLHDL 1.6 10/07/2020 0539   VLDL 8 10/07/2020 0539   LDLCALC 26 10/07/2020 0539   HgbA1c:  Lab Results  Component Value Date   HGBA1C 5.7 (H) 10/07/2020   Urine Drug Screen:     Component Value Date/Time   LABOPIA NONE DETECTED 07/02/2020 1830   COCAINSCRNUR NONE DETECTED 07/02/2020 1830   LABBENZ NONE DETECTED 07/02/2020 1830   AMPHETMU NONE DETECTED 07/02/2020 1830   THCU NONE DETECTED 07/02/2020 1830   LABBARB NONE DETECTED 07/02/2020 1830    Alcohol Level     Component Value Date/Time   ETH <10 10/06/2020 1727    IMAGING  CT HEAD CODE STROKE WO CONTRAST  10/06/20 1. Hyperdense Right MCA suspicious for Emergent Large Vessel Occlusion.  2. No acute cortically based infarct or acute intracranial hemorrhage identified. ASPECTS 10.  3. Stable CT appearance of brain parenchyma since August.   CT Code Stroke CTA Head and Neck W/WO contrast + CT Perfusion Brain  1. No emergent large vessel occlusion.  2. Persistent radiographic string sign of the proximal right ICA.  3. Delayed transit throughout the entire right cerebral hemisphere and left ACA territory related to the high-grade proximal right ICA stenosis and chronically diminutive/diseased left ACA. No core infarct identified by CTP.  4. Severe right P2 stenosis.  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  6. Bilateral ground-glass lung opacities which could reflect edema or infection. Moderate right pleural effusion.  7.  Aortic Atherosclerosis (ICD10-I70.0).   CT HEAD WO CONTRAST 10/07/20  No  acute finding by CT. Chronic small-vessel ischemic changes throughout the brain as outlined above.   DG Chest Port 1 View 10/06/20 Enlargement of cardiac silhouette with pulmonary vascular congestion. Increased interstitial markings in the mid to lower lungs question minimal edema or developing chronic interstitial lung disease.   Transthoracic Echocardiogram 10/07/20 1. Akinesis of the distal septum and apex; overall mildly reduced LV  function; no thrombus noted using definity.  2. Left ventricular ejection fraction, by estimation, is 40 to 45%. The  left ventricle has mildly decreased function. The left ventricle  demonstrates regional wall motion abnormalities (see scoring  diagram/findings for description). Left ventricular  diastolic function could not be evaluated.  3. Right ventricular systolic function is normal. The right ventricular  size is normal.  4. Left atrial size was severely dilated.  5. Right atrial size was severely dilated.  6. The mitral valve is normal in structure.  Mild mitral valve  regurgitation. No evidence of mitral stenosis.  7. Tricuspid valve regurgitation is moderate.  8. The aortic valve is tricuspid. Aortic valve regurgitation is mild.  Mild aortic valve stenosis.  9. The inferior vena cava is normal in size with greater than 50%  respiratory variability, suggesting right atrial pressure of 3 mmHg.   Transesophageal Echocardiogram 10/10/20 1. Akinesis of the distal septum and apex; overall mild LV dysfunction;  no vegetations.  2. Left ventricular ejection fraction, by estimation, is 45 to 50%. The  left ventricle has mildly decreased function. The left ventricle  demonstrates regional wall motion abnormalities (see scoring  diagram/findings for description).  3. Right ventricular systolic function is normal. The right ventricular  size is normal.  4. Left atrial size was severely dilated. No left atrial/left atrial  appendage thrombus was detected.  5. Right atrial size was moderately dilated.  6. The mitral valve is abnormal. Mild mitral valve regurgitation.  7. Tricuspid valve regurgitation is moderate to severe.  8. The aortic valve is tricuspid. Aortic valve regurgitation is mild.  9. There is Moderate (Grade III) plaque involving the descending aorta.   ECG - atrial fibrillation - ventricular response 78 BPM (See cardiology reading for complete details)  PHYSICAL EXAM  Temp:  [97.7 F (36.5 C)-98.6 F (37 C)] 98.4 F (36.9 C) (11/18 0819) Pulse Rate:  [65-76] 68 (11/18 0819) Resp:  [18-28] 18 (11/18 0819) BP: (68-167)/(43-94) 103/69 (11/18 0819) SpO2:  [94 %-99 %] 95 % (11/18 0819) Weight:  [72.3 kg] 72.3 kg (11/18 0500)  General - Well nourished, well developed, mild SOB with exertional tachypnea  Ophthalmologic - fundi not visualized due to noncooperation.  Cardiovascular - irregularly irregular heart rate and rhythm  Neuro - eyes open, moderate to severe dysarthria but able to answer questions.  Orientated to self, age, situation but not to year or month. However, able to follow simple commands, able to name and repeat but with very dysarthric and SOB with talking. Eyes able to gaze bilaterally, visual field full. PERRL. Left facial droop. Tongue protrusion midline. RUE 4/5, LUE 4-/5 without drift and finger grip and wrist extension 3/5. BLE 3-/5, able to hold knee flexion and foot on bed position but LLE weaker than left. No babinski bilaterally. Sensation subjectively symmetrical, coordination slow but grossly intact bilaterally and gait not tested.   ASSESSMENT/PLAN [name] is a 86 y.o. male with history of atrial fibrillation on Xarelto, PPM, L CEA, CAD, Htn, HLD, dementia and squamous cell cancer R parotid resected 2014, presenting with left sided weakness. He  did not receive IV t-PA due to anticoagulation with Xarelto. Acute right ICA angioplasty with cerebral protection 10/06/20 at 11:28 PM Dr Earleen Newport.  Stroke: R MCA infarct due to right ICA high grade stenosis s/p angioplasty, etiology likely due to large vessel disease. However, given fever and bacteremia, needs to rule out endocarditis   CT Head - Hyperdense Right MCA suspicious for Emergent Large Vessel Occlusion. No acute cortically based infarct or acute intracranial hemorrhage identified. ASPECTS 10.   CTA H&N - Persistent radiographic string sign of the proximal right ICA. Severe right P2 stenosis.   CTP - Delayed transit throughout the entire right cerebral hemisphere and left ACA territory related to the high-grade proximal right ICA stenosis and chronically diminutive/diseased left ACA. No core infarct identified by CTP.   IR - right ICA angioplasty, 86%->41% stenosis post angioplasty  CT head repeat x 2 - No acute finding by CT. Chronic small-vessel ischemic changes throughout the brain as outlined above.   2D Echo - EF 40 to 45% down from 60-65% in 06/2020, akinesis of the distal septum and apex  Carotid  doppler right ICA 80-99% stenosis  Lacey Jensen Virus 2 - negative  LDL - 26  HgbA1c - 5.7  UDS - not ordered  VTE prophylaxis - Lovenox  Xarelto (rivaroxaban) daily prior to admission, Xarelto resumed on 10/11/20. At the time Xarelto was resumed ASA 81 was continued given Carotid stenosis   Patient counseled to be compliant with his antithrombotic medications  Ongoing aggressive stroke risk factor management  Therapy recommendations:  CIR  Disposition:  Pending  Carotid stenosis  Hx of left ICA stenosis s/p left CEA  06/2020 CT head and neck showed right ICA bulb string sign  This admission CT head and neck again showed right ICA bulb string sign, status post angioplasty.  Right ICA 86% -> 41% stenosis post-procedure  Carotid Doppler post angioplasty right ICA 80-99% stenosis  Discussed with Dr. Earleen Newport, given clinical improvement, no further carotid revascularization needed at this time.  Will close follow-up in the clinic.  Continue ASA   Chronic afib Tachy-brady syndrome s/p pacer Cardiomyopathy  Pulmonary edema  On Xarelto PTA  Xarelto resumed for atrial fibrillation on 10/11/20   2D Echo - EF 40 to 45% down from 60-65% in 06/2020, akinesis of the distal septum and apex  CXR concerning for pulmonary edema -> improved  Still has tachypnea   TEE severe LAE; spontaneous contrast in LAA but no thrombus  Cardiology on board - appreciate help  Bacteremia  Fever Leukocytosis  Tmax 101.1->afebrile  On Abx   Blood culture 2/2 group B strep - pan sensitive  WBC 17.1->17.7->15.2->8.9->9.8  TEE no endocarditis  CCM signed off  Hx of TIA  06/2020 admitted to [facility] for confusion and slurry speech with slump over and left facial droop. CTA head and neck showed right ICA bulb string sign. EF 60-65%, LDL 61 and A1C 5.9.   Hx of Hypertension Hypotension   Home BP meds: metoprolol  Current BP meds: off levophed  SBP has fluctuated in the last 24 hours  alternating between soft pressures and elevations to the 150's and 160s. Will adjust HTN regimen as indicated.  . Long-term BP goal normotensive  Hyperlipidemia  Home Lipid lowering medication: Lipitor 40 mg daily  LDL 26, goal < 70  Lipitor was initiated at 20 mg this hospitalization. Can discharge on the lower dose of 20 given controlled LDL at 26 which is at goal from a stroke standpoint  Continue statin at discharge  Dysphagia   Passed barium study 11/15  On dys1 and thin  Speech on board  Other Stroke Risk Factors  Advanced age  Former cigarette smoker - quit 47 years ago  ETOH use, pt has 1 drink per week  Coronary artery disease  Other Active Problems   Code status - DNR  Aortic Atherosclerosis (ICD10-I70.0).   Acute blood loss anemia - Hgb - 8.8->7.9->8.2->8.8->9.8->7.4  Given hemoglobin gtt to 7.4, repeat H&H ordered + type and screen on 10/12/20. Will transfuse if indicated. FOBT ordered given hx of GI Bleed in 2019. Home Protonix 40 mg BID resumed on 10/12/20. CKD - stage 3b - creatinine - 1.30->1.23->1.48->1.13  Dementia - lives with son  Hypokalemia [facility] day # [place], NP [facility] Nurse Practitioner  10/12/2020 9:53 AM   ATTENDING NOTE: I reviewed above note and agree with the assessment and plan. Pt was seen and examined.   RN at bedside, patient sitting in chair, not in acute distress.  However still has exertional dyspnea.  Vitals stable, however labs showed hemoglobin dropped to 7.4 from yesterday 9.8.  FOBT positive, but no overt sign of bleeding.  Of note, patient did have GI bleeding in 03/2018 with melena.  He received 2 units PRBC and IV iron.  GI recommended PPI twice daily, and holding Xarelto for a week at that time.  Given his GI bleeding history, dropping hemoglobin, CHF, will give 1 unit PRBC transfusion today.  Will start iron pills.  Continue PPI twice daily.  Repeat H&H  after blood transfusion.  Will hold off Xarelto for now, continue baby aspirin.  Once hemoglobin stabilized, will rechallenge with Xarelto.  Continue antibiotic for 10-day course of bacteremia.  I had long discussion with daughter over the phone, updated pt current condition, treatment plan and potential prognosis, and answered all the questions.  She expressed understanding and appreciation.  She also consented for blood transfusion. I spent  35 minutes in total face-to-face time with the patient, more than 50% of which was spent in counseling and coordination of care, reviewing test results, images and medication, and discussing the diagnosis, treatment plan and potential prognosis. This patient's care requiresreview of multiple databases, neurological assessment, discussion with family, other specialists and medical decision making of high complexity.   Rosalin Hawking, MD PhD Stroke Neurology 10/12/2020 5:12 PM     To contact Stroke Continuity provider, please refer to [email or web address] After hours, contact General Neurology

## 2020-10-12 NOTE — Progress Notes (Signed)
Occupational Therapy Treatment Patient Details Name: Fernando [name] MRN: [id number] DOB: Jan 03, 1927 Today's Date: 10/12/2020    History of present illness The pt is a 86 yo male presenting with L sided weakness, aphasia, and AMS. Upon work-up, found to have large R MCA stenosis and is now s/p stenting. Pt intubated for procedure 11/12 and extubated 11/13. PMH includes: a fib on Xarelto, CAD, HTN, HLD, pacemaker 10/2012.   OT comments  Pt seen in conjunction with PT to maximize pts activity tolerance. Pt currently requires MAX A +2 to pivot to recliner, pt unable to come into full stand with pt having difficulty brining hips into full extension. Pt completed x3 sit<>stands from EOB with MAX A +2. Pt continues to present with decreased activity tolerance, generalized weakness and does have DOE of 2/4 with increased RR requiring rest breaks. Pt would continue to benefit from skilled occupational therapy while admitted and after d/c to address the below listed limitations in order to improve overall functional mobility and facilitate independence with BADL participation. DC plan remains appropriate, will follow acutely per POC.     Follow Up Recommendations  CIR;Supervision/Assistance - 24 hour    Equipment Recommendations  None recommended by OT    Recommendations for Other Services      Precautions / Restrictions Precautions Precautions: Fall Precaution Comments: rectal tube Restrictions Weight Bearing Restrictions: No       Mobility Bed Mobility Overal bed mobility: Needs Assistance Bed Mobility: Supine to Sit   Sidelying to sit: Mod assist;+2 for physical assistance       General bed mobility comments: Pt requiring assist for L LE to EOB and to lift trunk; good initiation with lifting trunk but did requiring mod A and mod A to scoot forward; required cues for sequencing  Transfers Overall transfer level: Needs assistance Equipment used: 1 person hand held assist;2 person  hand held assist (back of chari) Transfers: Sit to/from [employer] to Stand: Max assist;+2 physical assistance;From elevated surface Stand pivot transfers: Max assist;+2 physical assistance       General transfer comment: 3 x sit to stand from elevated bed with max A of 2 to complete. Pt with mod A initially but required max x 2 to come upright and still with difficulty tucking buttock.  Max x 2 pivot to chair toward L side    Balance Overall balance assessment: Needs assistance Sitting-balance support: Feet supported;No upper extremity supported;Single extremity supported Sitting balance-Leahy Scale: Fair Sitting balance - Comments: close supervision for static sitting; able to do without UE but preferred to have UE support     Standing balance-Leahy Scale: Zero Standing balance comment: Unable to stand completely upright and required max A x 2; performed 3 stands and 8 partial stands from chair                           ADL either performed or assessed with clinical judgement   ADL   Eating/Feeding: Supervision/ safety;Set up;Minimal assistance;Sitting Eating/Feeding Details (indicate cue type and reason): light MIN A to facilitate AROM of LUE                     Toilet Transfer: Maximal assistance;+2 for physical assistance;+2 for safety/equipment Toilet Transfer Details (indicate cue type and reason): simulated via functional mobility MAX A +2 to pivot to recliner Toileting- Clothing Manipulation and Hygiene: Total assistance;Sit to/from stand Toileting - Clothing Manipulation Details (indicate cue type  and reason): for posterior pericare in standing     Functional mobility during ADLs: Maximal assistance;+2 for physical assistance (stand pivot transfer) General ADL Comments: pt continues to present with decreased activity tolerance, generalized weakness decreased functional ROM of LUE     Vision Patient Visual Report: No change from  baseline     Perception     Praxis      Cognition Arousal/Alertness: Awake/alert Behavior During Therapy: WFL for tasks assessed/performed Overall Cognitive Status: Impaired/Different from baseline Area of Impairment: Following commands;Safety/judgement;Problem solving;Attention                   Current Attention Level: Selective   Following Commands: Follows one step commands with increased time Safety/Judgement: Decreased awareness of safety;Decreased awareness of deficits   Problem Solving: Slow processing;Requires verbal cues;Requires tactile cues;Difficulty sequencing General Comments: Pt is HOH and sometiimes difficult to assist.  Granddaughter present and reports he is seeming closer to baaseline.  Does require increased time and multimodal cueing        Exercises  Other Exercises Other Exercises: Partial sit to stands x 5 for exercise but then required 3 more for ADLs/pad straightening: max A, feet blocked, pushed up with arms on armrest Other Exercises: functional ROM ~ 90* active sh flexion however pt with difficulty with self feeding tasks needing facitilitation at elbow to being cup/ spoon to mouth with LUE   Shoulder Instructions       General Comments VSS; does have DOE of 2/4 with increased RR requiring rest breaks    Pertinent Vitals/ Pain       Pain Assessment: No/denies pain  Home Living                                          Prior Functioning/Environment              Frequency  Min 2X/week        Progress Toward Goals  OT Goals(current goals can now be found in the care plan section)  Progress towards OT goals: Progressing toward goals  Acute Rehab OT Goals Patient Stated Goal: home with his family OT Goal Formulation: With patient Time For Goal Achievement: 10/21/20 Potential to Achieve Goals: Good  Plan Discharge plan remains appropriate;Frequency remains appropriate    Co-evaluation      Reason for  Co-Treatment: Complexity of the patient's impairments (multi-system involvement);For patient/therapist safety;To address functional/ADL transfers PT goals addressed during session: Mobility/safety with mobility;Strengthening/ROM OT goals addressed during session: ADL's and self-care;Strengthening/ROM      AM-PAC OT "6 Clicks" Daily Activity     Outcome Measure   Help from another person eating meals?: A Little Help from another person taking care of personal grooming?: A Little Help from another person toileting, which includes using toliet, bedpan, or urinal?: Total Help from another person bathing (including washing, rinsing, drying)?: A Lot Help from another person to put on and taking off regular upper body clothing?: A Lot Help from another person to put on and taking off regular lower body clothing?: Total 6 Click Score: 12    End of Session Equipment Utilized During Treatment: Gait belt  OT Visit Diagnosis: Unsteadiness on feet (R26.81);Low vision, both eyes (H54.2);Muscle weakness (generalized) (M62.81);Other symptoms and signs involving cognitive function   Activity Tolerance Patient tolerated treatment well   Patient Left in chair;with call bell/phone within reach;with chair alarm  set;with family/visitor present;with nursing/sitter in room   Nurse Communication Mobility status;Other (comment) (+2 assist with stedy back to bed)        Time: 1000-1031 OT Time Calculation (min): 31 min  Charges: OT General Charges $OT Visit: 1 Visit OT Treatments $Self Care/Home Management : 8-22 mins  Lanier Clam., COTA/L Acute Rehabilitation Services [phone] [place] 10/12/2020, 11:33 AM

## 2020-10-12 NOTE — Progress Notes (Addendum)
   10/12/20 1250 10/12/20 1253  Vitals  BP (!) 62/46 (!) 118/58  MAP (mmHg) (!) 52  --   BP Location Left Arm Right Arm  BP Method Automatic Automatic  Patient Position (if appropriate) Sitting Sitting  Pt BP in Left arm is significantly less than the right arm. Mannual BP was also taken Left 74/60 and Right arm was 122/64. Pulse on Left side is very faint but easy to find with doppler. Dr. Erlinda Hong Ruta Hinds NP notified

## 2020-10-12 NOTE — Progress Notes (Signed)
Inpatient Rehab Admissions Coordinator:   Following for my colleague, Danne Baxter.  Received initial denial from [employer].  Peer to peer offered and Dr. Dagoberto Ligas to complete.  Will continue to follow for determination.   Shann Medal, PT, DPT Admissions Coordinator [phone] 10/12/20  11:12 AM

## 2020-10-12 NOTE — Care Management Important Message (Signed)
Important Message  Patient Details  Name: Fernando [name] MRN: [id number] Date of Birth: 05/12/1927   Medicare Important Message Given:  Yes     Travor Royce Montine Circle 10/12/2020, 3:32 PM

## 2020-10-12 NOTE — Progress Notes (Signed)
1715: 1 Unit of PRBC completed.  IV lasix given right after. No signs and symptoms of any adverse effects. Vital signs within normal range.

## 2020-10-12 NOTE — Progress Notes (Signed)
  Speech Language Pathology Treatment: Dysphagia  Patient Details Name: Fernando [name] MRN: [id number] DOB: Aug 07, 1927 Today's Date: 10/12/2020 Time: 4033-5331 SLP Time Calculation (min) (ACUTE ONLY): 8 min  Assessment / Plan / Recommendation Clinical Impression  Pt was seen for dysphagia treatment with granddaughter at bedside. The primary focus of the session was caregiver education. Granddaughter was educated on the findings of the recent MBS, diet recommendations, and compensatory strategies to utilize. She reports that he is tolerating his diet well but continues to demonstrate immediate coughing with thin liquids. SLP reiterated the use of small, single sips and frequent cough/throat clear to address aspiration risk. Both the pt and his granddaughter reported understanding. Acute SLP will sign off at this time, f/u at next level of care.   HPI HPI: The pt is a 86 yo male presenting with L sided weakness and AMS. Upon work-up, found to have large R MCA stenosis and is now s/p stenting. Pt intubated for procedure 11/12 and extubated 11/13. PMH includes: a fib on Xarelto, CAD, HTN, HLD, parotidectomy 10/2012, pacemaker 10/2012.       SLP Plan  All goals met;Discharge SLP treatment due to (comment)       Recommendations  Diet recommendations: Dysphagia 1 (puree);Thin liquid Liquids provided via: Straw Medication Administration: Crushed with puree Supervision: Full supervision/cueing for compensatory strategies;Staff to assist with self feeding Compensations: Hard cough after swallow;Follow solids with liquid                Oral Care Recommendations: Oral care QID;Staff/trained caregiver to provide oral care;Oral care prior to ice chip/H20 Follow up Recommendations: Inpatient Rehab SLP Visit Diagnosis: Dysphagia, oropharyngeal phase (R13.12) Plan: All goals met;Discharge SLP treatment due to (comment)       GO                Greggory Keen 10/12/2020, 11:40 AM

## 2020-10-13 LAB — CULTURE, BLOOD (ROUTINE X 2)
Culture: NO GROWTH
Culture: NO GROWTH
Special Requests: ADEQUATE
Special Requests: ADEQUATE

## 2020-10-13 LAB — GLUCOSE, CAPILLARY
Glucose-Capillary: 116 mg/dL — ABNORMAL HIGH (ref 70–99)
Glucose-Capillary: 106 mg/dL — ABNORMAL HIGH (ref 70–99)
Glucose-Capillary: 113 mg/dL — ABNORMAL HIGH (ref 70–99)
Glucose-Capillary: 112 mg/dL — ABNORMAL HIGH (ref 70–99)
Glucose-Capillary: 104 mg/dL — ABNORMAL HIGH (ref 70–99)

## 2020-10-13 LAB — BPAM RBC
Blood Product Expiration Date: 202112142359
ISSUE DATE / TIME: 202111181401
Unit Type and Rh: 6200

## 2020-10-13 LAB — CBC
HCT: 29.2 % — ABNORMAL LOW (ref 39.0–52.0)
Hemoglobin: 9.2 g/dL — ABNORMAL LOW (ref 13.0–17.0)
MCH: 24.1 pg — ABNORMAL LOW (ref 26.0–34.0)
MCHC: 31.5 g/dL (ref 30.0–36.0)
MCV: 76.6 fL — ABNORMAL LOW (ref 80.0–100.0)
Platelets: 248 10*3/uL (ref 150–400)
RBC: 3.81 MIL/uL — ABNORMAL LOW (ref 4.22–5.81)
RDW: 19.7 % — ABNORMAL HIGH (ref 11.5–15.5)
WBC: 10.7 10*3/uL — ABNORMAL HIGH (ref 4.0–10.5)
nRBC: 0.8 % — ABNORMAL HIGH (ref 0.0–0.2)

## 2020-10-13 LAB — BASIC METABOLIC PANEL
Anion gap: 9 (ref 5–15)
BUN: 30 mg/dL — ABNORMAL HIGH (ref 8–23)
CO2: 22 mmol/L (ref 22–32)
Calcium: 7.9 mg/dL — ABNORMAL LOW (ref 8.9–10.3)
Chloride: 114 mmol/L — ABNORMAL HIGH (ref 98–111)
Creatinine, Ser: 1.11 mg/dL (ref 0.61–1.24)
GFR, Estimated: >60 mL/min (ref >60)
Glucose, Bld: 115 mg/dL — ABNORMAL HIGH (ref 70–99)
Potassium: 3.3 mmol/L — ABNORMAL LOW (ref 3.5–5.1)
Sodium: 145 mmol/L (ref 135–145)

## 2020-10-13 LAB — TYPE AND SCREEN
ABO/RH(D): A POS
Antibody Screen: NEGATIVE
Unit division: 0

## 2020-10-13 MED ORDER — INSULIN ASPART 100 UNIT/ML ~~LOC~~ SOLN
0.0000 [IU] | Freq: Three times a day (TID) | SUBCUTANEOUS | Status: DC
  Administered 2020-10-14: 1 [IU] via SUBCUTANEOUS

## 2020-10-13 MED ORDER — POTASSIUM CHLORIDE 20 MEQ PO PACK
40.0000 meq | PACK | Freq: Two times a day (BID) | ORAL | Status: AC
  Administered 2020-10-13 (×2): 40 meq via ORAL
  Filled 2020-10-13 (×2): qty 2

## 2020-10-13 NOTE — Progress Notes (Signed)
Physical Therapy Treatment Patient Details Name: Fernando [name] MRN: [id number] DOB: 17-Sep-1927 Today's Date: 10/13/2020    History of Present Illness The pt is a 86 yo male presenting with L sided weakness, aphasia, and AMS. Upon work-up, found to have large R MCA stenosis and is now s/p stenting. Pt intubated for procedure 11/12 and extubated 11/13. PMH includes: a fib on Xarelto, CAD, HTN, HLD, pacemaker 10/2012.    PT Comments    Pt tolerates treatment well but remains very weak, requiring significant physical assistance for all functional mobility tasks. Pt remains unable to safely transfer with one person assistance at this time, continuing to demonstrate difficulty fully extending hips to stand. Pt will continue to benefit from PT POC and aggressive mobilization to improve mobility and reduce falls risk. PT recommends discharge to CIR at this time.   Follow Up Recommendations  CIR     Equipment Recommendations  Wheelchair (measurements PT);Wheelchair cushion (measurements PT) (mechanical lift, if home today)    Recommendations for Other Services       Precautions / Restrictions Precautions Precautions: Fall Restrictions Weight Bearing Restrictions: No    Mobility  Bed Mobility Overal bed mobility: Needs Assistance Bed Mobility: Supine to Sit;Sit to Supine     Supine to sit: Mod assist;HOB elevated Sit to supine: Mod assist      Transfers Overall transfer level: Needs assistance Equipment used: 1 person hand held assist Transfers: Sit to/from Stand Sit to Stand: Max assist         General transfer comment: 3 sit to stand attempts, pt able to obtain 50-75% erect standing position. PT provides R knee block to prevent feet from slipping anteriorly.  Ambulation/Gait Ambulation/Gait assistance:  (deferred 2/2 weakness)               Stairs             Wheelchair Mobility    Modified Rankin (Stroke Patients Only) Modified Rankin (Stroke  Patients Only) Pre-Morbid Rankin Score: Moderately severe disability Modified Rankin: Severe disability     Balance Overall balance assessment: Needs assistance Sitting-balance support: Single extremity supported;Feet supported Sitting balance-Leahy Scale: Poor Sitting balance - Comments: minG with unilateral UE support of bed   Standing balance support: Bilateral upper extremity supported Standing balance-Leahy Scale: Zero Standing balance comment: maxA with BUE support and PT knee block                            Cognition Arousal/Alertness: Awake/alert Behavior During Therapy: WFL for tasks assessed/performed Overall Cognitive Status: Impaired/Different from baseline Area of Impairment: Attention;Memory;Following commands;Safety/judgement;Awareness;Problem solving                   Current Attention Level: Sustained Memory: Decreased recall of precautions Following Commands: Follows one step commands consistently Safety/Judgement: Decreased awareness of safety;Decreased awareness of deficits   Problem Solving: Slow processing;Requires verbal cues;Requires tactile cues        Exercises General Exercises - Lower Extremity Ankle Circles/Pumps: AROM;Both;10 reps Short Arc Quad: AROM;Both;10 reps Heel Slides: AROM;Both;10 reps    General Comments General comments (skin integrity, edema, etc.): VSS, pt has wheezing intermittently with labile RR, pt sats stable while on RA      Pertinent Vitals/Pain Pain Assessment: No/denies pain    Home Living                      Prior Function  PT Goals (current goals can now be found in the care plan section) Acute Rehab PT Goals Patient Stated Goal: home with his family Progress towards PT goals: Not progressing toward goals - comment (limited by LE weakness)    Frequency    Min 4X/week      PT Plan Current plan remains appropriate    Co-evaluation              AM-PAC PT  "6 Clicks" Mobility   Outcome Measure  Help needed turning from your back to your side while in a flat bed without using bedrails?: A Lot Help needed moving from lying on your back to sitting on the side of a flat bed without using bedrails?: A Lot Help needed moving to and from a bed to a chair (including a wheelchair)?: Total Help needed standing up from a chair using your arms (e.g., wheelchair or bedside chair)?: Total Help needed to walk in hospital room?: Total Help needed climbing 3-5 steps with a railing? : Total 6 Click Score: 8    End of Session Equipment Utilized During Treatment: Gait belt Activity Tolerance: Patient tolerated treatment well Patient left: in bed;with call bell/phone within reach;with bed alarm set Nurse Communication: Mobility status;Need for lift equipment PT Visit Diagnosis: Unsteadiness on feet (R26.81);Muscle weakness (generalized) (M62.81);Other abnormalities of gait and mobility (R26.89);Hemiplegia and hemiparesis Hemiplegia - Right/Left: Left Hemiplegia - dominant/non-dominant: Dominant Hemiplegia - caused by: Other cerebrovascular disease     Time: 1205-1222 PT Time Calculation (min) (ACUTE ONLY): 17 min  Charges:  $Therapeutic Activity: 8-22 mins                     Zenaida Niece, PT, DPT Acute Rehabilitation Pager: [phone]    Zenaida Niece 10/13/2020, 1:34 PM

## 2020-10-13 NOTE — Progress Notes (Signed)
Pt requested to be put on the bedpan to have a bm. Pt used bed pan appropriately. Instructed to continue using call bell as needed. Pt verbalized understanding.

## 2020-10-13 NOTE — Progress Notes (Signed)
STROKE TEAM PROGRESS NOTE   INTERVAL HISTORY No family at the bedside. Pt sitting in chair, more awake alert, less lethargic, less SOB and speech much clearer than yesterday. Had PRBC yesterday and today Hb 9.2. pending CIR tomorrow. K 3.3 and will supplement   OBJECTIVE Vitals:   10/13/20 0500 10/13/20 0822 10/13/20 1157 10/13/20 1501  BP:  (!) 156/77 (!) 154/68 (!) 144/62  Pulse:  68 68 62  Resp:  17 20 20   Temp:  98 F (36.7 C) 98.7 F (37.1 C) 98.9 F (37.2 C)  TempSrc:  Oral Oral Oral  SpO2:  98% 98% 97%  Weight: 70.9 kg     Height:        CBC:  Recent Labs  Lab 10/12/20 0855 10/12/20 0855 10/12/20 1817 10/13/20 0246  WBC 9.8  --   --  10.7*  HGB 7.4*   < > 9.2* 9.2*  HCT 24.6*   < > 29.4* 29.2*  MCV 75.7*  --   --  76.6*  PLT 229  --   --  248   < > = values in this interval not displayed.    Basic Metabolic Panel:  Recent Labs  Lab 10/09/20 1112 10/09/20 1112 10/10/20 0340 10/10/20 1835 10/12/20 0855 10/13/20 0246  NA 150*   < > 146*   < > 146* 145  K 3.3*   < > 2.9*   < > 3.7 3.3*  CL 118*   < > 119*   < > 118* 114*  CO2 17*   < > 18*   < > 20* 22  GLUCOSE 118*   < > 115*   < > 120* 115*  BUN 41*   < > 44*   < > 30* 30*  CREATININE 1.35*   < > 1.13   < > 1.05 1.11  CALCIUM 8.4*   < > 7.9*   < > 8.1* 7.9*  MG 2.1  --  2.2  --   --   --    < > = values in this interval not displayed.    Lipid Panel:     Component Value Date/Time   CHOL 87 10/07/2020 0539   TRIG 42 10/07/2020 0539   TRIG 39 10/07/2020 0539   HDL 53 10/07/2020 0539   CHOLHDL 1.6 10/07/2020 0539   VLDL 8 10/07/2020 0539   LDLCALC 26 10/07/2020 0539   HgbA1c:  Lab Results  Component Value Date   HGBA1C 5.7 (H) 10/07/2020   Urine Drug Screen:     Component Value Date/Time   LABOPIA NONE DETECTED 07/02/2020 1830   COCAINSCRNUR NONE DETECTED 07/02/2020 1830   LABBENZ NONE DETECTED 07/02/2020 1830   AMPHETMU NONE DETECTED 07/02/2020 1830   THCU NONE DETECTED 07/02/2020  1830   LABBARB NONE DETECTED 07/02/2020 1830    Alcohol Level     Component Value Date/Time   ETH <10 10/06/2020 1727    IMAGING  CT HEAD CODE STROKE WO CONTRAST 10/06/20 1. Hyperdense Right MCA suspicious for Emergent Large Vessel Occlusion.  2. No acute cortically based infarct or acute intracranial hemorrhage identified. ASPECTS 10.  3. Stable CT appearance of brain parenchyma since August.   CT Code Stroke CTA Head and Neck W/WO contrast + CT Perfusion Brain  1. No emergent large vessel occlusion.  2. Persistent radiographic string sign of the proximal right ICA.  3. Delayed transit throughout the entire right cerebral hemisphere and left ACA territory related to the high-grade proximal right  ICA stenosis and chronically diminutive/diseased left ACA. No core infarct identified by CTP.  4. Severe right P2 stenosis.  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  6. Bilateral ground-glass lung opacities which could reflect edema or infection. Moderate right pleural effusion.  7.  Aortic Atherosclerosis (ICD10-I70.0).   CT HEAD WO CONTRAST 10/07/20  No acute finding by CT. Chronic small-vessel ischemic changes throughout the brain as outlined above.   DG Chest Port 1 View 10/06/20 Enlargement of cardiac silhouette with pulmonary vascular congestion. Increased interstitial markings in the mid to lower lungs question minimal edema or developing chronic interstitial lung disease.   Transthoracic Echocardiogram 10/07/20 1. Akinesis of the distal septum and apex; overall mildly reduced LV  function; no thrombus noted using definity.  2. Left ventricular ejection fraction, by estimation, is 40 to 45%. The  left ventricle has mildly decreased function. The left ventricle  demonstrates regional wall motion abnormalities (see scoring  diagram/findings for description). Left ventricular  diastolic function could not be evaluated.  3. Right ventricular systolic function is normal. The  right ventricular  size is normal.  4. Left atrial size was severely dilated.  5. Right atrial size was severely dilated.  6. The mitral valve is normal in structure. Mild mitral valve  regurgitation. No evidence of mitral stenosis.  7. Tricuspid valve regurgitation is moderate.  8. The aortic valve is tricuspid. Aortic valve regurgitation is mild.  Mild aortic valve stenosis.  9. The inferior vena cava is normal in size with greater than 50%  respiratory variability, suggesting right atrial pressure of 3 mmHg.   Transesophageal Echocardiogram 10/10/20 1. Akinesis of the distal septum and apex; overall mild LV dysfunction;  no vegetations.  2. Left ventricular ejection fraction, by estimation, is 45 to 50%. The  left ventricle has mildly decreased function. The left ventricle  demonstrates regional wall motion abnormalities (see scoring  diagram/findings for description).  3. Right ventricular systolic function is normal. The right ventricular  size is normal.  4. Left atrial size was severely dilated. No left atrial/left atrial  appendage thrombus was detected.  5. Right atrial size was moderately dilated.  6. The mitral valve is abnormal. Mild mitral valve regurgitation.  7. Tricuspid valve regurgitation is moderate to severe.  8. The aortic valve is tricuspid. Aortic valve regurgitation is mild.  9. There is Moderate (Grade III) plaque involving the descending aorta.   ECG - atrial fibrillation - ventricular response 78 BPM (See cardiology reading for complete details)  PHYSICAL EXAM  Temp:  [97.3 F (36.3 C)-98.9 F (37.2 C)] 98.9 F (37.2 C) (11/19 1501) Pulse Rate:  [62-68] 62 (11/19 1501) Resp:  [17-20] 20 (11/19 1501) BP: (107-177)/(60-77) 144/62 (11/19 1501) SpO2:  [95 %-100 %] 97 % (11/19 1501) Weight:  [70.9 kg] 70.9 kg (11/19 0500)  General - Well nourished, well developed, mild SOB with exertional tachypnea  Ophthalmologic - fundi not  visualized due to noncooperation.  Cardiovascular - irregularly irregular heart rate and rhythm  Neuro - eyes open, moderate to severe dysarthria but able to answer questions. Orientated to self, age, situation but not to year or month. However, able to follow simple commands, able to name and repeat but with very dysarthric and SOB with talking. Eyes able to gaze bilaterally, visual field full. PERRL. Left facial droop. Tongue protrusion midline. RUE 4/5, LUE 4-/5 without drift and finger grip and wrist extension 3/5. BLE 3-/5, able to hold knee flexion and foot on bed position but LLE weaker  than left. No babinski bilaterally. Sensation subjectively symmetrical, coordination slow but grossly intact bilaterally and gait not tested.   ASSESSMENT/PLAN [name] is a 86 y.o. male with history of atrial fibrillation on Xarelto, PPM, L CEA, CAD, Htn, HLD, dementia and squamous cell cancer R parotid resected 2014, presenting with left sided weakness. He did not receive IV t-PA due to anticoagulation with Xarelto. Acute right ICA angioplasty with cerebral protection 10/06/20 at 11:28 PM Dr Earleen Newport.  Stroke: R MCA infarct due to right ICA high grade stenosis s/p angioplasty, etiology likely due to large vessel disease. However, given fever and bacteremia, needs to rule out endocarditis   CT Head - Hyperdense Right MCA suspicious for Emergent Large Vessel Occlusion. No acute cortically based infarct or acute intracranial hemorrhage identified. ASPECTS 10.   CTA H&N - Persistent radiographic string sign of the proximal right ICA. Severe right P2 stenosis.   CTP - Delayed transit throughout the entire right cerebral hemisphere and left ACA territory related to the high-grade proximal right ICA stenosis and chronically diminutive/diseased left ACA. No core infarct identified by CTP.   IR - right ICA angioplasty, 86%->41% stenosis post angioplasty  CT head repeat x 2 - No acute finding by CT. Chronic  small-vessel ischemic changes throughout the brain as outlined above.   2D Echo - EF 40 to 45% down from 60-65% in 06/2020, akinesis of the distal septum and apex  Carotid doppler right ICA 80-99% stenosis  Lacey Jensen Virus 2 - negative  LDL - 26  HgbA1c - 5.7  UDS - not ordered  VTE prophylaxis - Lovenox  Xarelto (rivaroxaban) daily prior to admission, now on ASA 81 given Carotid stenosis. Xarelto resumed on 10/11/20 but currently on hold due to GI bleeding and anemia. S/p PRBC, if Hb continues to be stable for the next 4-5 days, will re-challenge with Xarelto  Patient counseled to be compliant with his antithrombotic medications  Ongoing aggressive stroke risk factor management  Therapy recommendations:  CIR  Disposition:  Anticipating admission to CIR tomorrow if stable.  Carotid stenosis  Hx of left ICA stenosis s/p left CEA  06/2020 CT head and neck showed right ICA bulb string sign  This admission CT head and neck again showed right ICA bulb string sign, status post angioplasty.  Right ICA 86% -> 41% stenosis post-procedure  Carotid Doppler post angioplasty right ICA 80-99% stenosis  Discussed with Dr. Earleen Newport, given clinical improvement, no further carotid revascularization needed at this time.  Will close follow-up in the clinic.  Continue ASA   Chronic afib Tachy-brady syndrome s/p pacer Cardiomyopathy  Pulmonary edema  On Xarelto PTA  Xarelto resumed for atrial fibrillation on 10/11/20 -> on hold 11/18 due to GIB  2D Echo - EF 40 to 45% down from 60-65% in 06/2020, akinesis of the distal septum and apex  CXR concerning for pulmonary edema -> improved  Still has tachypnea   TEE severe LAE; spontaneous contrast in LAA but no thrombus  Cardiology on board - appreciate help  Bacteremia  Fever Leukocytosis  Tmax 101.1->afebrile  On Abx for total 10 day course  Blood culture 2/2 group B strep - pan sensitive  WBC  17.1->17.7->15.2->8.9->9.8->10.7  TEE no endocarditis  CCM signed off  GIB  GI bleeding in 03/2018 with melena.  He received 2 units PRBC and IV iron.  GI recommended PPI twice daily, and holding Xarelto for a week at that time.    This admission, given his GIB history,  dropping hemoglobin, CHF, he received 1 unit PRBC transfusion 11/18.   Start on iron pills  Continue PPI twice daily.    Hb 8.8->9.8->7.4->9.2->9.2  Will hold off Xarelto for now, continue baby aspirin.    Once hemoglobin stabilized for 4-5 days, will rechallenge with Xarelto.    Hx of TIA  06/2020 admitted to [facility] for confusion and slurry speech with slump over and left facial droop. CTA head and neck showed right ICA bulb string sign. EF 60-65%, LDL 61 and A1C 5.9.   Hx of Hypertension Hypotension   Home BP meds: metoprolol  Current BP meds: off levophed  SBP has fluctuated in the last 24 hours alternating between soft pressures and elevations to the 150's and 160s. Will adjust HTN regimen as indicated.  . Long-term BP goal normotensive  Hyperlipidemia  Home Lipid lowering medication: Lipitor 40 mg daily  LDL 26, goal < 70  Now on Lipitor 20 mg this hospitalization.   Continue statin at discharge  Dysphagia   Passed barium study 11/15  On dys1 and thin  Speech on board  Other Stroke Risk Factors  Advanced age  Former cigarette smoker - quit 47 years ago  ETOH use, pt has 1 drink per week  Coronary artery disease  Other Active Problems   Code status - DNR  Aortic Atherosclerosis (ICD10-I70.0).   Acute blood loss anemia - Hgb - 8.8->7.9->8.2->8.8->9.8->7.4->transfused ->9.2->9.2 CKD - stage 3b - creatinine - 1.30->1.23->1.48->1.13->1.05>1.11  Dementia - lives with son  Hypokalemia K 4.3-3.3-2.9->3.3 - supplement and recheck in [facility] day # 7  Rosalin Hawking, MD PhD Stroke Neurology 10/13/2020 9:32 PM  To contact Stroke Continuity provider, please refer to  [email or web address] After hours, contact General Neurology

## 2020-10-13 NOTE — Progress Notes (Signed)
Incontinent care performed by this nurse and nurse tech [place]. Pt noted to have an external rectal tube in place that was saturated in soft stool. External device removed and skin was noted to be reddened underneath. Cleansed with warm soapy water and patted dry. Bearier cream put on. Complete linen change done. Pt is alert and tolerated rolling back and forth in the bed. Family at bedside.

## 2020-10-13 NOTE — Progress Notes (Signed)
Inpatient Rehab Admissions Coordinator:   I have a bed for this pt to admit to CIR on Saturday (11/20).  Dave Rhinehouse, PA-C,  in agreement.  Rehab MD (Dr. Letta Pate) to assess pt and confirm admission on Saturday.  Floor RN can call CIR at [phone] for report after 12pm on Saturday.  I have let pt/family and case manager know.    Shann Medal, PT, DPT Admissions Coordinator [phone] 10/13/20  11:00 AM

## 2020-10-14 ENCOUNTER — Encounter (HOSPITAL_COMMUNITY): Payer: Self-pay | Admitting: Physical Medicine & Rehabilitation

## 2020-10-14 ENCOUNTER — Inpatient Hospital Stay (HOSPITAL_COMMUNITY)
Admission: RE | Admit: 2020-10-14 | Discharge: 2020-11-11 | DRG: 056 | Disposition: A | Discharge status: Home Health | Payer: PPO | Source: Intra-hospital | Attending: Physical Medicine & Rehabilitation | Admitting: Physical Medicine & Rehabilitation

## 2020-10-14 ENCOUNTER — Other Ambulatory Visit: Payer: Self-pay

## 2020-10-14 DIAGNOSIS — I69354 Hemiplegia and hemiparesis following cerebral infarction affecting left non-dominant side: Secondary | ICD-10-CM | POA: Diagnosis not present

## 2020-10-14 DIAGNOSIS — I2510 Atherosclerotic heart disease of native coronary artery without angina pectoris: Secondary | ICD-10-CM | POA: Diagnosis not present

## 2020-10-14 DIAGNOSIS — R7881 Bacteremia: Secondary | ICD-10-CM | POA: Diagnosis not present

## 2020-10-14 DIAGNOSIS — Z79890 Hormone replacement therapy (postmenopausal): Secondary | ICD-10-CM | POA: Diagnosis not present

## 2020-10-14 DIAGNOSIS — I4821 Permanent atrial fibrillation: Secondary | ICD-10-CM | POA: Diagnosis present

## 2020-10-14 DIAGNOSIS — M1990 Unspecified osteoarthritis, unspecified site: Secondary | ICD-10-CM | POA: Diagnosis present

## 2020-10-14 DIAGNOSIS — R7303 Prediabetes: Secondary | ICD-10-CM | POA: Diagnosis not present

## 2020-10-14 DIAGNOSIS — I5023 Acute on chronic systolic (congestive) heart failure: Secondary | ICD-10-CM | POA: Diagnosis not present

## 2020-10-14 DIAGNOSIS — I69322 Dysarthria following cerebral infarction: Secondary | ICD-10-CM | POA: Diagnosis not present

## 2020-10-14 DIAGNOSIS — E876 Hypokalemia: Secondary | ICD-10-CM | POA: Diagnosis not present

## 2020-10-14 DIAGNOSIS — R059 Cough, unspecified: Secondary | ICD-10-CM | POA: Diagnosis not present

## 2020-10-14 DIAGNOSIS — I130 Hypertensive heart and chronic kidney disease with heart failure and stage 1 through stage 4 chronic kidney disease, or unspecified chronic kidney disease: Secondary | ICD-10-CM | POA: Diagnosis present

## 2020-10-14 DIAGNOSIS — I63511 Cerebral infarction due to unspecified occlusion or stenosis of right middle cerebral artery: Principal | ICD-10-CM

## 2020-10-14 DIAGNOSIS — I69391 Dysphagia following cerebral infarction: Secondary | ICD-10-CM

## 2020-10-14 DIAGNOSIS — J90 Pleural effusion, not elsewhere classified: Secondary | ICD-10-CM | POA: Diagnosis not present

## 2020-10-14 DIAGNOSIS — I129 Hypertensive chronic kidney disease with stage 1 through stage 4 chronic kidney disease, or unspecified chronic kidney disease: Secondary | ICD-10-CM | POA: Diagnosis not present

## 2020-10-14 DIAGNOSIS — D72829 Elevated white blood cell count, unspecified: Principal | ICD-10-CM

## 2020-10-14 DIAGNOSIS — J9811 Atelectasis: Secondary | ICD-10-CM | POA: Diagnosis not present

## 2020-10-14 DIAGNOSIS — L89326 Pressure-induced deep tissue damage of left buttock: Secondary | ICD-10-CM | POA: Diagnosis present

## 2020-10-14 DIAGNOSIS — Z808 Family history of malignant neoplasm of other organs or systems: Secondary | ICD-10-CM | POA: Diagnosis not present

## 2020-10-14 DIAGNOSIS — I4891 Unspecified atrial fibrillation: Secondary | ICD-10-CM | POA: Diagnosis not present

## 2020-10-14 DIAGNOSIS — I517 Cardiomegaly: Secondary | ICD-10-CM | POA: Diagnosis not present

## 2020-10-14 DIAGNOSIS — Z950 Presence of cardiac pacemaker: Secondary | ICD-10-CM

## 2020-10-14 DIAGNOSIS — Z8249 Family history of ischemic heart disease and other diseases of the circulatory system: Secondary | ICD-10-CM

## 2020-10-14 DIAGNOSIS — Z23 Encounter for immunization: Secondary | ICD-10-CM

## 2020-10-14 DIAGNOSIS — E785 Hyperlipidemia, unspecified: Secondary | ICD-10-CM | POA: Diagnosis not present

## 2020-10-14 DIAGNOSIS — D62 Acute posthemorrhagic anemia: Secondary | ICD-10-CM | POA: Diagnosis not present

## 2020-10-14 DIAGNOSIS — Z79899 Other long term (current) drug therapy: Secondary | ICD-10-CM

## 2020-10-14 DIAGNOSIS — I495 Sick sinus syndrome: Secondary | ICD-10-CM | POA: Diagnosis present

## 2020-10-14 DIAGNOSIS — N1831 Chronic kidney disease, stage 3a: Secondary | ICD-10-CM | POA: Diagnosis not present

## 2020-10-14 DIAGNOSIS — E039 Hypothyroidism, unspecified: Secondary | ICD-10-CM | POA: Diagnosis not present

## 2020-10-14 DIAGNOSIS — Z800 Family history of malignant neoplasm of digestive organs: Secondary | ICD-10-CM | POA: Diagnosis not present

## 2020-10-14 DIAGNOSIS — I69319 Unspecified symptoms and signs involving cognitive functions following cerebral infarction: Secondary | ICD-10-CM

## 2020-10-14 DIAGNOSIS — Z87891 Personal history of nicotine dependence: Secondary | ICD-10-CM

## 2020-10-14 DIAGNOSIS — Z85818 Personal history of malignant neoplasm of other sites of lip, oral cavity, and pharynx: Secondary | ICD-10-CM

## 2020-10-14 DIAGNOSIS — H9190 Unspecified hearing loss, unspecified ear: Secondary | ICD-10-CM | POA: Diagnosis present

## 2020-10-14 DIAGNOSIS — I10 Essential (primary) hypertension: Secondary | ICD-10-CM | POA: Diagnosis not present

## 2020-10-14 DIAGNOSIS — I429 Cardiomyopathy, unspecified: Secondary | ICD-10-CM | POA: Diagnosis present

## 2020-10-14 DIAGNOSIS — I5021 Acute systolic (congestive) heart failure: Secondary | ICD-10-CM | POA: Diagnosis not present

## 2020-10-14 DIAGNOSIS — R1312 Dysphagia, oropharyngeal phase: Secondary | ICD-10-CM | POA: Diagnosis present

## 2020-10-14 LAB — BASIC METABOLIC PANEL
Anion gap: 11 (ref 5–15)
BUN: 26 mg/dL — ABNORMAL HIGH (ref 8–23)
CO2: 20 mmol/L — ABNORMAL LOW (ref 22–32)
Calcium: 7.6 mg/dL — ABNORMAL LOW (ref 8.9–10.3)
Chloride: 112 mmol/L — ABNORMAL HIGH (ref 98–111)
Creatinine, Ser: 1.03 mg/dL (ref 0.61–1.24)
GFR, Estimated: >60 mL/min (ref >60)
Glucose, Bld: 105 mg/dL — ABNORMAL HIGH (ref 70–99)
Potassium: 3.9 mmol/L (ref 3.5–5.1)
Sodium: 143 mmol/L (ref 135–145)

## 2020-10-14 LAB — CBC
HCT: 32.8 % — ABNORMAL LOW (ref 39.0–52.0)
Hemoglobin: 10 g/dL — ABNORMAL LOW (ref 13.0–17.0)
MCH: 23.9 pg — ABNORMAL LOW (ref 26.0–34.0)
MCHC: 30.5 g/dL (ref 30.0–36.0)
MCV: 78.3 fL — ABNORMAL LOW (ref 80.0–100.0)
Platelets: 325 10*3/uL (ref 150–400)
RBC: 4.19 MIL/uL — ABNORMAL LOW (ref 4.22–5.81)
RDW: 21.4 % — ABNORMAL HIGH (ref 11.5–15.5)
WBC: 14.8 10*3/uL — ABNORMAL HIGH (ref 4.0–10.5)
nRBC: 0.3 % — ABNORMAL HIGH (ref 0.0–0.2)

## 2020-10-14 LAB — CULTURE, BLOOD (ROUTINE X 2)
Culture: NO GROWTH
Culture: NO GROWTH
Special Requests: ADEQUATE

## 2020-10-14 LAB — GLUCOSE, CAPILLARY
Glucose-Capillary: 101 mg/dL — ABNORMAL HIGH (ref 70–99)
Glucose-Capillary: 115 mg/dL — ABNORMAL HIGH (ref 70–99)
Glucose-Capillary: 141 mg/dL — ABNORMAL HIGH (ref 70–99)
Glucose-Capillary: 96 mg/dL (ref 70–99)
Glucose-Capillary: 97 mg/dL (ref 70–99)
Glucose-Capillary: 98 mg/dL (ref 70–99)

## 2020-10-14 MED ORDER — LEVOTHYROXINE SODIUM 50 MCG PO TABS
50.0000 ug | ORAL_TABLET | Freq: Every day | ORAL | Status: DC
  Administered 2020-10-15 – 2020-11-11 (×26): 50 ug via ORAL
  Filled 2020-10-14 (×28): qty 1

## 2020-10-14 MED ORDER — ACETAMINOPHEN 325 MG PO TABS
650.0000 mg | ORAL_TABLET | Freq: Four times a day (QID) | ORAL | Status: DC | PRN
  Administered 2020-10-18 – 2020-11-07 (×42): 650 mg via ORAL
  Filled 2020-10-14 (×44): qty 2

## 2020-10-14 MED ORDER — ATORVASTATIN CALCIUM 10 MG PO TABS
20.0000 mg | ORAL_TABLET | Freq: Every day | ORAL | Status: DC
  Administered 2020-10-15 – 2020-10-17 (×3): 20 mg via ORAL
  Administered 2020-10-18: 10 mg via ORAL
  Administered 2020-10-19 – 2020-11-11 (×24): 20 mg via ORAL
  Filled 2020-10-14 (×28): qty 2

## 2020-10-14 MED ORDER — PNEUMOCOCCAL VAC POLYVALENT 25 MCG/0.5ML IJ INJ
0.5000 mL | INJECTION | INTRAMUSCULAR | Status: AC
  Administered 2020-10-15: 0.5 mL via INTRAMUSCULAR
  Filled 2020-10-14: qty 0.5

## 2020-10-14 MED ORDER — PANTOPRAZOLE SODIUM 40 MG PO PACK
40.0000 mg | PACK | Freq: Two times a day (BID) | ORAL | Status: DC
  Administered 2020-10-14 – 2020-10-25 (×22): 40 mg via ORAL
  Filled 2020-10-14 (×21): qty 20

## 2020-10-14 MED ORDER — CHLORHEXIDINE GLUCONATE 0.12 % MT SOLN
15.0000 mL | Freq: Two times a day (BID) | OROMUCOSAL | Status: DC
  Administered 2020-10-14 – 2020-11-11 (×56): 15 mL via OROMUCOSAL
  Filled 2020-10-14 (×53): qty 15

## 2020-10-14 MED ORDER — INFLUENZA VAC A&B SA ADJ QUAD 0.5 ML IM PRSY
0.5000 mL | PREFILLED_SYRINGE | INTRAMUSCULAR | Status: AC
  Administered 2020-10-15: 0.5 mL via INTRAMUSCULAR
  Filled 2020-10-14: qty 0.5

## 2020-10-14 MED ORDER — METOPROLOL SUCCINATE ER 25 MG PO TB24
25.0000 mg | ORAL_TABLET | Freq: Every day | ORAL | Status: DC
  Administered 2020-10-15 – 2020-11-11 (×26): 25 mg via ORAL
  Filled 2020-10-14 (×29): qty 1

## 2020-10-14 MED ORDER — ACETAMINOPHEN 650 MG RE SUPP
650.0000 mg | Freq: Four times a day (QID) | RECTAL | Status: DC | PRN
  Administered 2020-10-22: 650 mg via RECTAL
  Filled 2020-10-14 (×2): qty 1

## 2020-10-14 MED ORDER — CEFAZOLIN SODIUM-DEXTROSE 2-4 GM/100ML-% IV SOLN
2.0000 g | Freq: Three times a day (TID) | INTRAVENOUS | Status: AC
  Administered 2020-10-14 – 2020-10-15 (×4): 2 g via INTRAVENOUS
  Filled 2020-10-14 (×5): qty 100

## 2020-10-14 MED ORDER — LOPERAMIDE HCL 2 MG PO CAPS: 2.0000 mg | ORAL_CAPSULE | Freq: Three times a day (TID) | ORAL | Status: DC | PRN

## 2020-10-14 MED ORDER — ORAL CARE MOUTH RINSE
15.0000 mL | Freq: Two times a day (BID) | OROMUCOSAL | Status: DC
  Administered 2020-10-15 – 2020-11-10 (×51): 15 mL via OROMUCOSAL

## 2020-10-14 MED ORDER — FERROUS SULFATE 325 (65 FE) MG PO TABS
325.0000 mg | ORAL_TABLET | Freq: Three times a day (TID) | ORAL | Status: DC
  Administered 2020-10-14 – 2020-11-11 (×79): 325 mg via ORAL
  Filled 2020-10-14 (×80): qty 1

## 2020-10-14 MED ORDER — ASPIRIN EC 81 MG PO TBEC
81.0000 mg | DELAYED_RELEASE_TABLET | Freq: Every day | ORAL | Status: DC
  Administered 2020-10-15 – 2020-11-11 (×28): 81 mg via ORAL
  Filled 2020-10-14 (×28): qty 1

## 2020-10-14 NOTE — H&P (Signed)
Physical Medicine and Rehabilitation Admission H&P       Chief Complaint  Patient presents with  . Shortness of Breath  . Back Pain  : HPI: Fernando [name] is a 86 year old right-handed male with history of atrial fibrillation maintained on Xarelto, hypertension, CKD stage III 1.30-1.48, history of GI bleed 2019, CAD with pacemaker, hyperlipidemia, prior left CEA, SCC of the carotid gland and recent TIA August 2021.  He quit smoking 47 years ago.  Per chart review lives with children.  He was receiving assistance for ADLs and home care.  1 level home 6 steps to entry.  Presented 10/07/2020 [name] left-sided weakness as well as dysarthria.  Admission chemistries glucose 172 BUN 37 creatinine 1.49, lactic acid 5.0, BNP 1097, WBC 17,100, hemoglobin 9.1, hemoglobin A1c 5.7. Cranial CT scan showed hyperdense right MCA suspicious for emergent large vessel occlusion.  No acute cortically based infarct or acute intracranial hemorrhage.  Patient did not receive TPA.  Carotid Doppler with right ICA stenosis 80 - 99%.  Echocardiogram with ejection fraction of 40 to 45% without thrombus.  TEE showed akinesis of the distal septum and apex ejection fraction 50% no thrombus noted.  No vegetation.  CTA of the head and neck no emergent large vessel occlusion.  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  Delayed transit throughout the entire right cerebral hemisphere and left ACA territory related to high-grade proximal right ICA stenosis and chronically diminutive disease left ACA.  Patient underwent ICA angioplasty per interventional radiology.  He did require short-term intubation.  Hospital course fever blood cultures 2/2 G+ cocci maintained on Rocephin.  He was cleared to begin aspirin for CVA prophylaxis for right MCA infarction due to right ICA high-grade stenosis and AWAIT PLAN TO [facility].    Currently on a dysphagia #1 thin liquid diet.  Blood culture 2/2 group B strep pansensitive placed on  Ancef therapy evaluations completed and patient was admitted for a comprehensive rehab program  Review of Systems  Constitutional: Positive for fever.  HENT: Negative for hearing loss.   Eyes: Negative for blurred vision and double vision.  Respiratory: Negative for cough and shortness of breath.   Cardiovascular: Positive for palpitations and leg swelling.  Gastrointestinal: Positive for constipation. Negative for heartburn, nausea and vomiting.  Genitourinary: Positive for urgency. Negative for dysuria, flank pain and hematuria.  Musculoskeletal: Positive for joint pain and myalgias.  Skin: Negative for rash.  Neurological: Positive for sensory change and weakness.  All other systems reviewed and are negative.      Past Medical History:  Diagnosis Date  . Arthritis   . Atrial fibrillation ([place])   . Carotid artery disease ([place])    Dr. Kellie Simmering - s/p left CEA  . Coronary artery disease   . Essential hypertension, benign   . Hyperlipidemia   . 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 This mass was initially felt to be 1.7 cm at the time of presentation but had enlarged significantly to 4 cm clinically at the time of surgery. Pathologically it was 3.4 cm. A lymph node deep to the parotid was enlarged clinically at the time of surgery but was negative   . Tachycardia-bradycardia syndrome [facility])         Past Surgical History:  Procedure Laterality Date  . CAROTID ENDARTERECTOMY Left Jan. 3, 2009   CE  . CATARACT EXTRACTION W/  INTRAOCULAR LENS IMPLANT     Bilateral  . ESOPHAGOGASTRODUODENOSCOPY N/A 03/29/2018   Procedure: ESOPHAGOGASTRODUODENOSCOPY (EGD);  Surgeon: Danie Binder, MD;  [facility];  Service: Endoscopy;  Laterality: N/A;  . EYE SURGERY    . INSERT / REPLACE / REMOVE PACEMAKER    . IR INTRAVSC STENT  CERV CAROTID W/EMB-PROT MOD SED INCL ANGIO  10/06/2020  . IR US GUIDE VASC ACCESS LEFT  10/06/2020  . IR US GUIDE VASC ACCESS RIGHT  10/06/2020  . IR US GUIDE VASC ACCESS RIGHT  10/06/2020  . PACEMAKER INSERTION  10-01-2012  . PARATHYROIDECTOMY  10/28/2012  . PAROTIDECTOMY  10/28/2012   Procedure: PAROTIDECTOMY;  Surgeon: Ascencion Dike, MD;  Location: [place];  Service: ENT;  Laterality: Right;  Total Parotidectomy  . PERMANENT PACEMAKER INSERTION N/A 10/01/2012   Procedure: PERMANENT PACEMAKER INSERTION;  Surgeon: Evans Lance, MD;  Location: [facility] CATH LAB;  Service: Cardiovascular;  Laterality: N/A;  . RADIOLOGY WITH ANESTHESIA N/A 10/07/2020   Procedure: MRI WITH ANESTHESIA;  Surgeon: Radiologist, Medication, MD;  Location: [place];  Service: Radiology;  Laterality: N/A;  . RADIOLOGY WITH ANESTHESIA N/A 10/06/2020   Procedure: IR WITH ANESTHESIA;  Surgeon: Luanne Bras, MD;  Location: [place];  Service: Radiology;  Laterality: N/A;  . TEE WITHOUT CARDIOVERSION N/A 10/10/2020   Procedure: TRANSESOPHAGEAL ECHOCARDIOGRAM (TEE);  Surgeon: Lelon Perla, MD;  Location: [facility] ENDOSCOPY;  Service: Cardiovascular;  Laterality: N/A;        Family History  Problem Relation Age of Onset  . Heart disease Sister   . Heart attack Sister   . Cirrhosis Mother   . Cancer Brother        brain cancer  . Cancer Sister        stomach cancer   Social History:  reports that he quit smoking about 47 years ago. His smoking use included cigarettes. He has never used smokeless tobacco. He reports current alcohol use of about 1.0 standard drink of alcohol per week. He reports that he does not use drugs. Allergies: No Known Allergies       Medications Prior to Admission  Medication Sig Dispense Refill  . acetaminophen (TYLENOL) 500 MG tablet Take 1,000 mg by mouth every 6 (six) hours as needed for mild pain.    Marland Kitchen atorvastatin (LIPITOR) 40 MG tablet Take 40 mg by mouth every morning.      Marland Kitchen levothyroxine (SYNTHROID) 50 MCG tablet Take 1 tablet (50 mcg total) by mouth daily before breakfast. 30 tablet 3  . metoprolol tartrate (LOPRESSOR) 25 MG tablet TAKE 1 TABLET TWICE A DAY (Patient taking differently: Take 25 mg by mouth 2 (two) times daily. ) 180 tablet 0  . pantoprazole (PROTONIX) 40 MG tablet Take 1 tablet (40 mg total) by mouth 2 (two) times daily before a meal. Twice a day for 3 months and then start taking daily. (Patient taking differently: Take 40 mg by mouth 2 (two) times daily before a meal. ) 60 tablet 3  . XARELTO 15 MG TABS tablet TAKE 1 TABLET BY MOUTH ONCE DAILY WITH SUPPER (Patient taking differently: Take 15 mg by mouth daily with supper. ) 90 tablet 3  . ondansetron (ZOFRAN ODT) 4 MG disintegrating tablet 4mg  ODT q4 hours prn nausea/vomit (Patient not taking: Reported on 10/09/2020) 12 tablet 0    Drug Regimen Review Drug regimen was reviewed and remains appropriate with no significant issues identified  Home: Home Living Family/patient expects to be discharged  to:: Private residence Living Arrangements: Children Available Help at Discharge: Family Type of Home: House Home Access: Stairs to enter Technical brewer of Steps: 5 Entrance Stairs-Rails: Right, Left Home Layout: One level Bathroom Shower/Tub: Chiropodist: Handicapped height Bathroom Accessibility: Yes Home Equipment: Environmental consultant - 2 wheels, [place] - single point, Bedside commode, Shower seat - built in Additional Comments: information from previous charting, pt unable to state clear answers to history questions or consistently answer yes/no. No family present at eval  Lives With: Son   Functional History: Prior Function Level of Independence: Needs assistance Comments: Per neurology note, pt received assist for ADLs and home care such as cooking, but pt did not need assist for mobility. Pt gave inconsistent answers during history in session  Functional Status:   Mobility: Bed Mobility Overal bed mobility: Needs Assistance Bed Mobility: Supine to Sit Rolling: Mod assist Sidelying to sit: Mod assist, +2 for physical assistance Supine to sit: HOB elevated, Mod assist General bed mobility comments: Pt requiring assist for L LE to EOB and to lift trunk; good initiation with lifting trunk but did requiring mod A and mod A to scoot forward; required cues for sequencing Transfers Overall transfer level: Needs assistance Equipment used: 1 person hand held assist, 2 person hand held assist (back of chari) Transfers: Sit to/from Stand, Risk manager Sit to Stand: Max assist, +2 physical assistance, From elevated surface Stand pivot transfers: Max assist, +2 physical assistance General transfer comment: 3 x sit to stand from elevated bed with max A of 2 to complete. Pt with mod A initially but required max x 2 to come upright and still with difficulty tucking buttock.  Max x 2 pivot to chair toward L side Ambulation/Gait Ambulation/Gait assistance: Max assist, +2 physical assistance Gait Distance (Feet): 3 Feet Assistive device: Rolling walker (2 wheeled) Gait Pattern/deviations: Step-to pattern, Decreased stride length, Shuffle, Leaning posteriorly, Trunk flexed General Gait Details: NT - DOE 2/4, LE buckling Gait velocity interpretation: <1.31 ft/sec, indicative of household ambulator  ADL: ADL Overall ADL's : Needs assistance/impaired Eating/Feeding: Supervision/ safety, Set up, Minimal assistance, Sitting Eating/Feeding Details (indicate cue type and reason): light MIN A to facilitate AROM of LUE Grooming: Minimal assistance, Sitting Upper Body Bathing: Moderate assistance, Sitting Lower Body Bathing: Maximal assistance, +2 for physical assistance, +2 for safety/equipment, Sit to/from stand Upper Body Dressing : Moderate assistance, Sitting Lower Body Dressing: Maximal assistance, Sitting/lateral leans, Sit to/from stand Toilet Transfer:  Maximal assistance, +2 for physical assistance, +2 for safety/equipment Toilet Transfer Details (indicate cue type and reason): simulated via functional mobility MAX A +2 to pivot to recliner Toileting- Clothing Manipulation and Hygiene: Total assistance, Sit to/from stand Toileting - Clothing Manipulation Details (indicate cue type and reason): for posterior pericare in standing Functional mobility during ADLs: Maximal assistance, +2 for physical assistance (stand pivot transfer) General ADL Comments: pt continues to present with decreased activity tolerance, generalized weakness decreased functional ROM of LUE  Cognition: Cognition Overall Cognitive Status: Impaired/Different from baseline Arousal/Alertness: Lethargic Orientation Level: Oriented to person, Oriented to place Safety/Judgment: Impaired Cognition Arousal/Alertness: Awake/alert Behavior During Therapy: WFL for tasks assessed/performed Overall Cognitive Status: Impaired/Different from baseline Area of Impairment: Following commands, Safety/judgement, Problem solving, Attention Orientation Level: Disoriented to, Situation, Time Current Attention Level: Selective Following Commands: Follows one step commands with increased time Safety/Judgement: Decreased awareness of safety, Decreased awareness of deficits Problem Solving: Slow processing, Requires verbal cues, Requires tactile cues, Difficulty sequencing General Comments: Pt is HOH and sometiimes  difficult to assist.  Granddaughter present and reports he is seeming closer to baaseline.  Does require increased time and multimodal cueing Difficult to assess due to: Hard of hearing/deaf  Physical Exam: Blood pressure 107/68, pulse 63, temperature 98 F (36.7 C), temperature source Oral, resp. rate 17, height 5\' 5"  (1.651 m), weight 72.3 kg, SpO2 95 %. Physical Exam Neurological:     Comments: Patient is alert and severely dysarthric.  Makes eye contact with examiner.   Oriented to self and age but not year or month.  Follows simple commands    General: No acute distress Mood and affect are appropriate Heart: Regular rate and rhythm no rubs murmurs or extra sounds Lungs: Clear to auscultation, breathing unlabored, no rales or wheezes Abdomen: Positive bowel sounds, soft nontender to palpation, nondistended Extremities: No clubbing, cyanosis, or edema, except for left dorsum of hand Skin: No evidence of breakdown, no evidence of rash Neurologic: Cranial nerves II through XII intact, motor strength is 5/5 in right deltoid, bicep, tricep, grip, hip flexor, knee extensors, ankle dorsiflexor and plantar flexor To minus left deltoid biceps triceps finger flexors 0 at the finger extensors 3 - left hip flexor knee extensor ankle dorsiflexor Sensory exam normal sensation to light touch  in bilateral upper and lower extremities Cerebellar exam normal finger to nose to finger bilaterally musculoskeletal: Full range of motion in all 4 extremities. No joint swelling   Lab Results Last 48 Hours        Results for orders placed or performed during the hospital encounter of 10/06/20 (from the past 48 hour(s))  Glucose, capillary     Status: None   Collection Time: 10/11/20  8:44 AM  Result Value Ref Range   Glucose-Capillary 92 70 - 99 mg/dL    Comment: Glucose reference range applies only to samples taken after fasting for at least 8 hours.  Glucose, capillary     Status: Abnormal   Collection Time: 10/11/20 11:46 AM  Result Value Ref Range   Glucose-Capillary 123 (H) 70 - 99 mg/dL    Comment: Glucose reference range applies only to samples taken after fasting for at least 8 hours.  Glucose, capillary     Status: None   Collection Time: 10/11/20  5:35 PM  Result Value Ref Range   Glucose-Capillary 95 70 - 99 mg/dL    Comment: Glucose reference range applies only to samples taken after fasting for at least 8 hours.  CBC     Status: Abnormal    Collection Time: 10/11/20  6:46 PM  Result Value Ref Range   WBC 8.9 4.0 - 10.5 K/uL   RBC 4.25 4.22 - 5.81 MIL/uL   Hemoglobin 9.8 (L) 13.0 - 17.0 g/dL   HCT 32.2 (L) 39 - 52 %   MCV 75.8 (L) 80.0 - 100.0 fL   MCH 23.1 (L) 26.0 - 34.0 pg   MCHC 30.4 30.0 - 36.0 g/dL   RDW 18.7 (H) 11.5 - 15.5 %   Platelets 217 150 - 400 K/uL   nRBC 1.5 (H) 0.0 - 0.2 %    Comment: Performed at [facility] [address] [place], [place] [address]  Glucose, capillary     Status: Abnormal   Collection Time: 10/11/20  9:00 PM  Result Value Ref Range   Glucose-Capillary 104 (H) 70 - 99 mg/dL    Comment: Glucose reference range applies only to samples taken after fasting for at least 8 hours.  Glucose, capillary  Status: Abnormal   Collection Time: 10/11/20 11:38 PM  Result Value Ref Range   Glucose-Capillary 112 (H) 70 - 99 mg/dL    Comment: Glucose reference range applies only to samples taken after fasting for at least 8 hours.  Glucose, capillary     Status: Abnormal   Collection Time: 10/12/20  4:28 AM  Result Value Ref Range   Glucose-Capillary 105 (H) 70 - 99 mg/dL    Comment: Glucose reference range applies only to samples taken after fasting for at least 8 hours.  Glucose, capillary     Status: Abnormal   Collection Time: 10/12/20  8:16 AM  Result Value Ref Range   Glucose-Capillary 106 (H) 70 - 99 mg/dL    Comment: Glucose reference range applies only to samples taken after fasting for at least 8 hours.  CBC     Status: Abnormal   Collection Time: 10/12/20  8:55 AM  Result Value Ref Range   WBC 9.8 4.0 - 10.5 K/uL   RBC 3.25 (L) 4.22 - 5.81 MIL/uL   Hemoglobin 7.4 (L) 13.0 - 17.0 g/dL    Comment: Reticulocyte Hemoglobin testing may be clinically indicated, consider ordering this additional test [id number]    HCT 24.6 (L) 39 - 52 %   MCV 75.7 (L) 80.0 - 100.0 fL   MCH 22.8 (L) 26.0 - 34.0 pg   MCHC 30.1 30.0 - 36.0 g/dL   RDW 19.2  (H) 11.5 - 15.5 %   Platelets 229 150 - 400 K/uL   nRBC 0.8 (H) 0.0 - 0.2 %    Comment: Performed at [facility], [place] [address], [place], [place] [address]  Basic metabolic panel     Status: Abnormal   Collection Time: 10/12/20  8:55 AM  Result Value Ref Range   Sodium 146 (H) 135 - 145 mmol/L   Potassium 3.7 3.5 - 5.1 mmol/L   Chloride 118 (H) 98 - 111 mmol/L   CO2 20 (L) 22 - 32 mmol/L   Glucose, Bld 120 (H) 70 - 99 mg/dL    Comment: Glucose reference range applies only to samples taken after fasting for at least 8 hours.   BUN 30 (H) 8 - 23 mg/dL   Creatinine, Ser 1.05 0.61 - 1.24 mg/dL   Calcium 8.1 (L) 8.9 - 10.3 mg/dL   GFR, Estimated >60 >60 mL/min    Comment: (NOTE) Calculated using the CKD-EPI Creatinine Equation (2021)    Anion gap 8 5 - 15    Comment: Performed at [facility] [address] [place], [place] [address]  Type and screen [place]     Status: None (Preliminary result)   Collection Time: 10/12/20 10:42 AM  Result Value Ref Range   ABO/RH(D) A POS    Antibody Screen NEG    Sample Expiration 10/15/2020,2359    Unit Number [id number]    Blood Component Type RED CELLS,LR    Unit division 00    Status of Unit ISSUED    Transfusion Status OK TO TRANSFUSE    Crossmatch Result      Compatible Performed at [facility], [place] [address], [place], [place] [address]   Glucose, capillary     Status: Abnormal   Collection Time: 10/12/20 12:22 PM  Result Value Ref Range   Glucose-Capillary 116 (H) 70 - 99 mg/dL    Comment: Glucose reference range applies only to samples taken after fasting for at least 8 hours.  Prepare RBC (crossmatch)     Status: None   Collection Time: 10/12/20 12:53 PM  Result Value Ref Range   Order Confirmation      ORDER PROCESSED BY BLOOD BANK Performed at [facility], [address], [place], [place] [address]   Glucose,  capillary     Status: Abnormal   Collection Time: 10/12/20  3:54 PM  Result Value Ref Range   Glucose-Capillary 105 (H) 70 - 99 mg/dL    Comment: Glucose reference range applies only to samples taken after fasting for at least 8 hours.  Occult blood card to lab, stool     Status: Abnormal   Collection Time: 10/12/20  5:50 PM  Result Value Ref Range   Fecal Occult Bld POSITIVE (A) NEGATIVE    Comment: Performed at [facility], [address], [place], [place] [address]  Hemoglobin and hematocrit, blood     Status: Abnormal   Collection Time: 10/12/20  6:17 PM  Result Value Ref Range   Hemoglobin 9.2 (L) 13.0 - 17.0 g/dL   HCT 29.4 (L) 39 - 52 %    Comment: Performed at [facility] [address] [place], [place] [address]  Glucose, capillary     Status: Abnormal   Collection Time: 10/12/20  8:32 PM  Result Value Ref Range   Glucose-Capillary 127 (H) 70 - 99 mg/dL    Comment: Glucose reference range applies only to samples taken after fasting for at least 8 hours.  Glucose, capillary     Status: Abnormal   Collection Time: 10/12/20 11:51 PM  Result Value Ref Range   Glucose-Capillary 117 (H) 70 - 99 mg/dL    Comment: Glucose reference range applies only to samples taken after fasting for at least 8 hours.  CBC     Status: Abnormal   Collection Time: 10/13/20  2:46 AM  Result Value Ref Range   WBC 10.7 (H) 4.0 - 10.5 K/uL   RBC 3.81 (L) 4.22 - 5.81 MIL/uL   Hemoglobin 9.2 (L) 13.0 - 17.0 g/dL   HCT 29.2 (L) 39 - 52 %   MCV 76.6 (L) 80.0 - 100.0 fL   MCH 24.1 (L) 26.0 - 34.0 pg   MCHC 31.5 30.0 - 36.0 g/dL   RDW 19.7 (H) 11.5 - 15.5 %   Platelets 248 150 - 400 K/uL   nRBC 0.8 (H) 0.0 - 0.2 %    Comment: Performed at [facility], [place] [address], [place], [place] [address]  Basic metabolic panel     Status: Abnormal   Collection Time: 10/13/20  2:46 AM  Result Value Ref Range   Sodium 145 135 - 145 mmol/L   Potassium  3.3 (L) 3.5 - 5.1 mmol/L   Chloride 114 (H) 98 - 111 mmol/L   CO2 22 22 - 32 mmol/L   Glucose, Bld 115 (H) 70 - 99 mg/dL    Comment: Glucose reference range applies only to samples taken after fasting for at least 8 hours.   BUN 30 (H) 8 - 23 mg/dL   Creatinine, Ser 1.11 0.61 - 1.24 mg/dL   Calcium 7.9 (L) 8.9 - 10.3 mg/dL   GFR, Estimated >60 >60 mL/min    Comment: (NOTE) Calculated using the CKD-EPI Creatinine Equation (2021)    Anion gap 9 5 - 15    Comment: Performed at [facility] [address], [place], [place] [address]  Glucose, capillary  Status: Abnormal   Collection Time: 10/13/20  4:13 AM  Result Value Ref Range   Glucose-Capillary 116 (H) 70 - 99 mg/dL    Comment: Glucose reference range applies only to samples taken after fasting for at least 8 hours.     Imaging Results (Last 48 hours)  No results found.       Medical Problem List and Plan: 1.  Left side weakness dysarthria/dysphagia secondary to right MCA infarction due to right ICA high-grade stenosis status post angioplasty             -patient may  shower             -ELOS/Goals: 17 to 21 days 2.  Antithrombotics: -DVT/anticoagulation: Per neurology stopping Xarelto            -antiplatelet therapy: Aspirin 81 mg daily 3. Pain Management: Tylenol as needed 4. Mood: Provide emotional support             -antipsychotic agents: N/A 5. Neuropsych: This patient is capable of making decisions on his own behalf. 6. Skin/Wound Care: Routine skin checks 7. Fluids/Electrolytes/Nutrition: Routine in and outs with follow-up chemistries 8.  Atrial fibrillation/tachybradycardia syndrome/pacemaker.  Cardiac rate controlled.  Continue Toprol 25 mg daily 9.  SCC of the carotid gland.  Follow-up outpatient 10.  Hypertension.  Toprol 25 mg daily.  Monitor with increased mobility 11.  Hypothyroidism.  Synthroid 12.  Bacteremia.  Ancef 2 g every 8 hours and complete 10-day course 13.   Hyperlipidemia.  Lipitor 14.  Dysphagia.  Dysphagia #1 thin liquids.  Follow-up speech therapy 15.CKD stage III.  Baseline 1.30-1.48.  Follow-up chemistries 16.  Acute blood loss anemia with history of GI bleed 2019.  Patient transfused 1 unit packed red blood cells 10/12/2020.  Continue iron supplement.  Protonix 40 mg twice daily.  Follow-up CBC-still pending from this morning, will transfuse if less than 7 g 17.  Prediabetes.  Hemoglobin A1c 5.7.  SSI.   Lavon Paganini [place], PA-C 10/13/2020 "I have personally performed a face to face diagnostic evaluation of this patient.  Additionally, I have reviewed and concur with the physician assistant's documentation above." Charlett Blake M.D. [facility] FAAPM&R (Neuromuscular Med) Diplomate Am Board of Electrodiagnostic Med Fellow Am Board of Interventional Pain

## 2020-10-14 NOTE — Progress Notes (Signed)
Patient arrived to unit with daughter at bedside. Alert and in no distress. Oriented to unit routine and made comfortable. No questions or concerns at this time.

## 2020-10-14 NOTE — Plan of Care (Signed)
Patient discharged to CIR. Report called to Rehab RN. Patient transferred accompanied by Nurse/NT/Daughter.

## 2020-10-14 NOTE — Progress Notes (Signed)
Occupational Therapy Treatment Patient Details Name: Fernando [name] MRN: [id number] DOB: 1927/03/12 Today's Date: 10/14/2020    History of present illness The pt is a 86 yo male presenting with L sided weakness, aphasia, and AMS. Upon work-up, found to have large R MCA stenosis and is now s/p stenting. Pt intubated for procedure 11/12 and extubated 11/13. PMH includes: a fib on Xarelto, CAD, HTN, HLD, pacemaker 10/2012.   OT comments  Patient continues to make steady progress towards goals in skilled OT session. Patient's session encompassed ADLs, further assessment of cognition, and peri-care. Pt is making progress with following commands, but continues to require increased time in order to complete one-step prompts. Pt with increased bed mobility in session, however limited to remaining in bed due to need for extensive clean up after being soiled (new catheter etc.) Discharge remains appropriate as pt has strong family support, motivation, and is progressing well; therapy will continue to follow.    Follow Up Recommendations  CIR;Supervision/Assistance - 24 hour    Equipment Recommendations  None recommended by OT    Recommendations for Other Services      Precautions / Restrictions Precautions Precautions: Fall Restrictions Weight Bearing Restrictions: No       Mobility Bed Mobility Overal bed mobility: Needs Assistance Bed Mobility: Supine to Sit;Rolling;Sit to Supine Rolling: Mod assist   Supine to sit: Mod assist;HOB elevated Sit to supine: Mod assist   General bed mobility comments: Pt requiring assist for L LE to EOB and to lift trunk; good initiation with lifting trunk but did requiring mod A and mod A to scoot forward; required cues for sequencing  Transfers                 General transfer comment: deferred due to pt being soiled and requring increased clean up and new condom cath    Balance                                            ADL either performed or assessed with clinical judgement   ADL Overall ADL's : Needs assistance/impaired     Grooming: Minimal assistance;Bed level;Wash/dry hands;Wash/dry Forensic scientist and Hygiene: Total assistance;Bed level Toileting - Clothing Manipulation Details (indicate cue type and reason): soiled when rolling towards EOB, returned to supine to complete peri care, increased ability to follow commands and roll with mod A     Functional mobility during ADLs: Maximal assistance;+2 for physical assistance General ADL Comments: pt continues to present with decreased activity tolerance, generalized weakness decreased functional ROM of LUE     Vision       Perception     Praxis      Cognition Arousal/Alertness: Awake/alert   Overall Cognitive Status: Impaired/Different from baseline Area of Impairment: Attention;Memory;Following commands;Safety/judgement;Awareness;Problem solving                   Current Attention Level: Sustained Memory: Decreased recall of precautions Following Commands: Follows one step commands consistently Safety/Judgement: Decreased awareness of safety;Decreased awareness of deficits   Problem Solving: Slow processing;Requires verbal cues;Requires tactile cues General Comments: Pt is HOH and sometiimes difficult to assist, continues to require increased time and multimodal cueing        Exercises  Shoulder Instructions       General Comments      Pertinent Vitals/ Pain       Pain Assessment: No/denies pain  Home Living                                          Prior Functioning/Environment              Frequency  Min 2X/week        Progress Toward Goals  OT Goals(current goals can now be found in the care plan section)  Progress towards OT goals: Progressing toward goals  Acute Rehab OT Goals Patient Stated Goal: home with his  family OT Goal Formulation: With patient Time For Goal Achievement: 10/21/20 Potential to Achieve Goals: Good  Plan Discharge plan remains appropriate;Frequency remains appropriate    Co-evaluation                 AM-PAC OT "6 Clicks" Daily Activity     Outcome Measure   Help from another person eating meals?: A Little Help from another person taking care of personal grooming?: A Little Help from another person toileting, which includes using toliet, bedpan, or urinal?: Total Help from another person bathing (including washing, rinsing, drying)?: A Lot Help from another person to put on and taking off regular upper body clothing?: A Lot Help from another person to put on and taking off regular lower body clothing?: Total 6 Click Score: 12    End of Session    OT Visit Diagnosis: Unsteadiness on feet (R26.81);Low vision, both eyes (H54.2);Muscle weakness (generalized) (M62.81);Other symptoms and signs involving cognitive function   Activity Tolerance Patient tolerated treatment well   Patient Left in bed;with call bell/phone within reach;with bed alarm set;with nursing/sitter in room   Nurse Communication Mobility status        Time: 2122-4825 OT Time Calculation (min): 23 min  Charges: OT General Charges $OT Visit: 1 Visit OT Treatments $Self Care/Home Management : 23-37 mins  [place]. Fernando [name], COTA/L Acute Rehabilitation Services [phone] [place] 10/14/2020, 3:15 PM

## 2020-10-14 NOTE — Discharge Summary (Signed)
Patient ID: Joaopedro Eschbach   MRN: [id number]      DOB: 1927-09-27  Date of Admission: 10/06/2020 Date of Discharge: 10/14/2020  Attending Physician:  Rosalin Hawking, MD, Stroke MD Consultant(s):  PM&R - Raulkar, Clide Deutscher, MD  ;  CCM - Schertz, Michele Mcalpine, MD Patient's PCP:  Rosita Fire, MD  DISCHARGE DIAGNOSIS:   R MCA infarct due to right ICA high grade stenosis  Balloon angioplasty Rt ICA - Dr. Earleen Newport  Atrial fibrillation [facility])  Essential hypertension, benign  Tachy-brady syndrome ([place])  Pacemaker-St.Jude  Carotid stenosis  CAD (coronary artery disease), native coronary artery  HLD (hyperlipidemia)  Hypothyroidism  Stroke (cerebrum) ([place])  Sepsis with acute organ dysfunction without septic shock ([place])  Systolic CHF ([place])  Bacteremia  Gi bleed precluding anticoagulation  TEE 10/10/20  Hypokalemia  CKD stage 3b  Dementia   Aortic Atherosclerosis (ICD10-I70.0)  Acute blood loss anemia  Dysphagia due to stroke    Past Medical History:  Diagnosis Date  . Arthritis   . Atrial fibrillation ([place])   . Carotid artery disease ([place])    Dr. Kellie Simmering - s/p left CEA  . Coronary artery disease   . Essential hypertension, benign   . Hyperlipidemia   . 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 This mass was initially felt to be 1.7 cm at the time of presentation but had enlarged significantly to 4 cm clinically at the time of surgery. Pathologically it was 3.4 cm. A lymph node deep to the parotid was enlarged clinically at the time of surgery but was negative   . Tachycardia-bradycardia syndrome [facility])    Past Surgical History:  Procedure Laterality Date  . CAROTID ENDARTERECTOMY Left Jan. 3, 2009   CE  . CATARACT EXTRACTION W/ INTRAOCULAR LENS IMPLANT     Bilateral  . ESOPHAGOGASTRODUODENOSCOPY N/A 03/29/2018    Procedure: ESOPHAGOGASTRODUODENOSCOPY (EGD);  Surgeon: Danie Binder, MD;  [facility];  Service: Endoscopy;  Laterality: N/A;  . EYE SURGERY    . INSERT / REPLACE / REMOVE PACEMAKER    . IR INTRAVSC STENT CERV CAROTID W/EMB-PROT MOD SED INCL ANGIO  10/06/2020  . IR US GUIDE VASC ACCESS LEFT  10/06/2020  . IR US GUIDE VASC ACCESS RIGHT  10/06/2020  . IR US GUIDE VASC ACCESS RIGHT  10/06/2020  . PACEMAKER INSERTION  10-01-2012  . PARATHYROIDECTOMY  10/28/2012  . PAROTIDECTOMY  10/28/2012   Procedure: PAROTIDECTOMY;  Surgeon: Ascencion Dike, MD;  Location: [place];  Service: ENT;  Laterality: Right;  Total Parotidectomy  . PERMANENT PACEMAKER INSERTION N/A 10/01/2012   Procedure: PERMANENT PACEMAKER INSERTION;  Surgeon: Evans Lance, MD;  Location: [facility] CATH LAB;  Service: Cardiovascular;  Laterality: N/A;  . RADIOLOGY WITH ANESTHESIA N/A 10/07/2020   Procedure: MRI WITH ANESTHESIA;  Surgeon: Radiologist, Medication, MD;  Location: [place];  Service: Radiology;  Laterality: N/A;  . RADIOLOGY WITH ANESTHESIA N/A 10/06/2020   Procedure: IR WITH ANESTHESIA;  Surgeon: Luanne Bras, MD;  Location: [place];  Service: Radiology;  Laterality: N/A;  . TEE WITHOUT CARDIOVERSION N/A 10/10/2020   Procedure: TRANSESOPHAGEAL ECHOCARDIOGRAM (TEE);  Surgeon: Lelon Perla, MD;  Location: [facility] ENDOSCOPY;  Service: Cardiovascular;  Laterality: N/A;    HOSPITAL MEDICATIONS . aspirin EC  81 mg Oral Daily  . atorvastatin  20 mg Oral Daily  . ferrous sulfate  325 mg Oral TID WC  . insulin aspart  0-9 Units Subcutaneous TID WC & HS  . levothyroxine  50 mcg Oral Q0600  . mouth rinse  15 mL Mouth Rinse BID  . metoprolol succinate  25 mg Oral Daily  . pantoprazole sodium  40 mg Oral BID    HOME MEDICATIONS PRIOR TO ADMISSION Medications Prior to Admission  Medication Sig Dispense Refill  . acetaminophen (TYLENOL) 500 MG tablet Take 1,000 mg by mouth every 6 (six) hours as needed for mild pain.    Marland Kitchen  atorvastatin (LIPITOR) 40 MG tablet Take 40 mg by mouth every morning.     Marland Kitchen levothyroxine (SYNTHROID) 50 MCG tablet Take 1 tablet (50 mcg total) by mouth daily before breakfast. 30 tablet 3  . metoprolol tartrate (LOPRESSOR) 25 MG tablet TAKE 1 TABLET TWICE A DAY (Patient taking differently: Take 25 mg by mouth 2 (two) times daily. ) 180 tablet 0  . pantoprazole (PROTONIX) 40 MG tablet Take 1 tablet (40 mg total) by mouth 2 (two) times daily before a meal. Twice a day for 3 months and then start taking daily. (Patient taking differently: Take 40 mg by mouth 2 (two) times daily before a meal. ) 60 tablet 3  . XARELTO 15 MG TABS tablet TAKE 1 TABLET BY MOUTH ONCE DAILY WITH SUPPER (Patient taking differently: Take 15 mg by mouth daily with supper. ) 90 tablet 3  . ondansetron (ZOFRAN ODT) 4 MG disintegrating tablet 4mg  ODT q4 hours prn nausea/vomit (Patient not taking: Reported on 10/09/2020) 12 tablet 0    LABORATORY STUDIES CBC    Component Value Date/Time   WBC 10.7 (H) 10/13/2020 0246   RBC 3.81 (L) 10/13/2020 0246   HGB 9.2 (L) 10/13/2020 0246   HCT 29.2 (L) 10/13/2020 0246   PLT 248 10/13/2020 0246   MCV 76.6 (L) 10/13/2020 0246   MCH 24.1 (L) 10/13/2020 0246   MCHC 31.5 10/13/2020 0246   RDW 19.7 (H) 10/13/2020 0246   LYMPHSABS 0.3 (L) 10/06/2020 1042   MONOABS 1.6 (H) 10/06/2020 1042   EOSABS 0.0 10/06/2020 1042   BASOSABS 0.0 10/06/2020 1042   CMP    Component Value Date/Time   NA 143 10/14/2020 0327   K 3.9 10/14/2020 0327   CL 112 (H) 10/14/2020 0327   CO2 20 (L) 10/14/2020 0327   GLUCOSE 105 (H) 10/14/2020 0327   BUN 26 (H) 10/14/2020 0327   CREATININE 1.03 10/14/2020 0327   CREATININE 0.95 09/28/2012 1308   CALCIUM 7.6 (L) 10/14/2020 0327   PROT 7.8 10/06/2020 1042   ALBUMIN 3.7 10/06/2020 1042   AST 29 10/06/2020 1042   ALT 18 10/06/2020 1042   ALKPHOS 62 10/06/2020 1042   BILITOT 1.0 10/06/2020 1042   GFRNONAA >60 10/14/2020 0327   GFRAA 44 (L) 07/02/2020  1845   COAGS Lab Results  Component Value Date   INR 1.5 (H) 10/06/2020   INR 1.5 (H) 07/02/2020   INR 0.95 09/28/2012   Lipid Panel    Component Value Date/Time   CHOL 87 10/07/2020 0539   TRIG 42 10/07/2020 0539   TRIG 39 10/07/2020 0539   HDL 53 10/07/2020 0539   CHOLHDL 1.6 10/07/2020 0539   VLDL 8 10/07/2020 0539   LDLCALC 26 10/07/2020 0539   HgbA1C  Lab Results  Component Value Date   HGBA1C 5.7 (H) 10/07/2020   Urinalysis    Component Value Date/Time   COLORURINE AMBER (A) 10/06/2020 1500   APPEARANCEUR HAZY (  A) 10/06/2020 1500   LABSPEC 1.017 10/06/2020 1500   PHURINE 5.0 10/06/2020 1500   GLUCOSEU NEGATIVE 10/06/2020 1500   HGBUR LARGE (A) 10/06/2020 1500   BILIRUBINUR NEGATIVE 10/06/2020 1500   KETONESUR NEGATIVE 10/06/2020 1500   PROTEINUR 100 (A) 10/06/2020 1500   UROBILINOGEN 0.2 12/21/2007 1618   NITRITE NEGATIVE 10/06/2020 1500   LEUKOCYTESUR NEGATIVE 10/06/2020 1500   Urine Drug Screen     Component Value Date/Time   LABOPIA NONE DETECTED 07/02/2020 1830   COCAINSCRNUR NONE DETECTED 07/02/2020 1830   LABBENZ NONE DETECTED 07/02/2020 1830   AMPHETMU NONE DETECTED 07/02/2020 1830   THCU NONE DETECTED 07/02/2020 1830   LABBARB NONE DETECTED 07/02/2020 1830    Alcohol Level    Component Value Date/Time   ETH <10 10/06/2020 1727     SIGNIFICANT DIAGNOSTIC STUDIES  Neuro Interventional Radiology - Cerebral Angiogram with Intervention - Dr Earleen Newport 10/06/2020  IMPRESSION: Status post ultrasound guided access right common femoral artery and right radial artery for treatment of right ICA critical stenosis contributing to acute right MCA syndrome with balloon angioplasty using cerebral protection device, achieving 41% residual stenosis, decreased from 86%. Angio-Seal for right common femoral hemostasis. Signed, Dulcy Fanny. Earleen Newport, DO, RPVI  CT HEAD CODE STROKE WO CONTRAST 10/06/20 1. Hyperdense Right MCA suspicious for Emergent Large Vessel  Occlusion.  2. No acute cortically based infarct or acute intracranial hemorrhage identified. ASPECTS 10.  3. Stable CT appearance of brain parenchyma since August.   CT Code Stroke CTA Head and Neck W/WO contrast + CT Perfusion Brain  1. No emergent large vessel occlusion.  2. Persistent radiographic string sign of the proximal right ICA.  3. Delayed transit throughout the entire right cerebral hemisphere and left ACA territory related to the high-grade proximal right ICA stenosis and chronically diminutive/diseased left ACA. No core infarct identified by CTP.  4. Severe right P2 stenosis.  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  6. Bilateral ground-glass lung opacities which could reflect edema or infection. Moderate right pleural effusion.  7.  Aortic Atherosclerosis (ICD10-I70.0).   CT HEAD WO CONTRAST 10/07/20  No acute finding by CT. Chronic small-vessel ischemic changes throughout the brain as outlined above.   DG Chest Port 1 View 10/06/20 Enlargement of cardiac silhouette with pulmonary vascular congestion. Increased interstitial markings in the mid to lower lungs question minimal edema or developing chronic interstitial lung disease.   Transthoracic Echocardiogram 10/07/20 1. Akinesis of the distal septum and apex; overall mildly reduced LV  function; no thrombus noted using definity.  2. Left ventricular ejection fraction, by estimation, is 40 to 45%. The  left ventricle has mildly decreased function. The left ventricle  demonstrates regional wall motion abnormalities (see scoring  diagram/findings for description). Left ventricular  diastolic function could not be evaluated.  3. Right ventricular systolic function is normal. The right ventricular  size is normal.  4. Left atrial size was severely dilated.  5. Right atrial size was severely dilated.  6. The mitral valve is normal in structure. Mild mitral valve  regurgitation. No evidence of mitral stenosis.   7. Tricuspid valve regurgitation is moderate.  8. The aortic valve is tricuspid. Aortic valve regurgitation is mild.  Mild aortic valve stenosis.  9. The inferior vena cava is normal in size with greater than 50%  respiratory variability, suggesting right atrial pressure of 3 mmHg.   Transesophageal Echocardiogram 10/10/20 1. Akinesis of the distal septum and apex; overall mild LV dysfunction;  no vegetations.  2.  Left ventricular ejection fraction, by estimation, is 45 to 50%. The  left ventricle has mildly decreased function. The left ventricle  demonstrates regional wall motion abnormalities (see scoring  diagram/findings for description).  3. Right ventricular systolic function is normal. The right ventricular  size is normal.  4. Left atrial size was severely dilated. No left atrial/left atrial  appendage thrombus was detected.  5. Right atrial size was moderately dilated.  6. The mitral valve is abnormal. Mild mitral valve regurgitation.  7. Tricuspid valve regurgitation is moderate to severe.  8. The aortic valve is tricuspid. Aortic valve regurgitation is mild.  9. There is Moderate (Grade III) plaque involving the descending aorta.   ECG - atrial fibrillation - ventricular response 78 BPM (See cardiology reading for complete details)    HISTORY OF PRESENT ILLNESS (From H&P Donnetta Simpers, MD on 10/06/20) Derek Laughter is a 86 y.o. male with PMH significant for chronic Afib on Xarelto, CAD, HTN, HLD, prior L CEA, SCC of the parotid gland, recent TIA in august 2021 with workup at that time revealing significant short R ICA stenosis with string sign on CT Angios who presents as a transfer from [facility] for stenting of the R ICA. Patient was initially seen at [facility] for weakness and poor responsiveness. A stroke code was called and he was seen by teleneurology for progressively worsening weakness. He was noted to be able to raise both his arms up as  recently as 1630 on 10/06/20. He was not a candidate for tPA due to him being on Xarelto. He had a CT angio and CT perfusion which demonstrated persistent R ICA string sign with large mismatch of 356ml and no core in the entire R cerebral hemisphere with delayed transit. He was sent over for urgent stent placement in the R CCA. NIHSS: 19 MRS: 3 TPA: No, on xarelto Thrombectomy: yes.   HOSPITAL COURSE Mr. Brevin Mcfadden is a 86 y.o. male with history of atrial fibrillation on Xarelto, PPM, L CEA, CAD, Htn, HLD, dementia and squamous cell cancer R parotid resected 2014, presenting with left sided weakness. He did not receive IV t-PA due to anticoagulation with Xarelto. Acute right ICA angioplasty with cerebral protection 10/06/20 at 11:28 PM Dr Earleen Newport.  Stroke: R MCA infarct due to right ICA high grade stenosis s/p angioplasty, etiology likely due to large vessel disease. However, given fever and bacteremia, needs to rule out endocarditis   CT Head - Hyperdense Right MCA suspicious for Emergent Large Vessel Occlusion. No acute cortically based infarct or acute intracranial hemorrhage identified. ASPECTS 10.   CTA H&N - Persistent radiographic string sign of the proximal right ICA. Severe right P2 stenosis.   CTP - Delayed transit throughout the entire right cerebral hemisphere and left ACA territory related to the high-grade proximal right ICA stenosis and chronically diminutive/diseased left ACA. No core infarct identified by CTP.   IR - right ICA angioplasty, 86%->41% stenosis post angioplasty  CT head repeat x 2 - No acute finding by CT. Chronic small-vessel ischemic changes throughout the brain as outlined above.   2D Echo - EF 40 to 45% down from 60-65% in 06/2020, akinesis of the distal septum and apex  Carotid doppler right ICA 80-99% stenosis  Lacey Jensen Virus 2 - negative  LDL - 26  HgbA1c - 5.7  UDS - not ordered  VTE prophylaxis - Lovenox  Xarelto (rivaroxaban) daily  prior to admission, now on ASA 81 given Carotid stenosis. Xarelto resumed  on 10/11/20 but currently on hold due to GI bleeding and anemia. S/p PRBC, if Hb continues to be stable for the next 4-5 days, will re-challenge with Xarelto  Patient counseled to be compliant with his antithrombotic medications  Ongoing aggressive stroke risk factor management  Therapy recommendations:  CIR  Disposition:  Admitted to CIR   Carotid stenosis  Hx of left ICA stenosis s/p left CEA  06/2020 CT head and neck showed right ICA bulb string sign  This admission CT head and neck again showed right ICA bulb string sign, status post angioplasty.  Right ICA 86% -> 41% stenosis post-procedure  Carotid Doppler post angioplasty right ICA 80-99% stenosis  Discussed with Dr. Earleen Newport, given clinical improvement, no further carotid revascularization needed at this time.  Will have close follow-up in the clinic.  Continue ASA   Chronic afib Tachy-brady syndrome s/p pacer Cardiomyopathy  Pulmonary edema  On Xarelto PTA  Xarelto resumed for atrial fibrillation on 10/11/20 -> on hold 11/18 due to GIB - Dr Leonie Man to re-evealuate to determine further anticoagulation trial.  2D Echo - EF 40 to 45% down from 60-65% in 06/2020, akinesis of the distal septum and apex  CXR concerning for pulmonary edema -> improved  Still has tachypnea   TEE severe LAE; spontaneous contrast in LAA but no thrombus  Cardiology on board - appreciate help  Bacteremia  Fever Leukocytosis  Tmax 101.1->afebrile  On Abx for total 10 day course  Blood culture 2/2 group B strep - pan sensitive  WBC 17.1->17.7->15.2->8.9->9.8->10.7->14.8 (afebrile)  TEE no endocarditis  CCM signed off  GIB  GI bleeding in 03/2018 with melena. He received 2 units PRBC and IV iron. GI recommended PPI twice daily, and holding Xarelto for a week at that time.   This admission, given his GIB history, dropping hemoglobin, CHF, he received 1  unit PRBC transfusion 11/18.   Start on iron pills  Continue PPI twice daily.   Hb 8.8->9.8->7.4->9.2->9.2  Will hold off Xarelto for now, continue baby aspirin.   Once hemoglobin stabilized for 4-5 days, will rechallenge with Xarelto.   Hx of TIA  06/2020 admitted to [facility] for confusion and slurry speech with slump over and left facial droop. CTA head and neck showed right ICA bulb string sign. EF 60-65%, LDL 61 and A1C 5.9.   Hx of Hypertension Hypotension   Home BP meds: metoprolol  Current BP meds: off levophed  SBP has fluctuated in the last 24 hours alternating between soft pressures and elevations to the 150's and 160s. Will adjust HTN regimen as indicated.   Long-term BP goal normotensive  Hyperlipidemia  Home Lipid lowering medication: Lipitor 40 mg daily  LDL 26, goal < 70  Now on Lipitor 20 mg this hospitalization.   Continue statin at discharge  Dysphagia   Passed barium study 11/15  On dys1 and thin  Speech on board  Other Stroke Risk Factors  Advanced age  Former cigarette smoker - quit 47 years ago  ETOH use, pt has 1 drink per week  Coronary artery disease  Other Active Problems   Code status - DNR  Aortic Atherosclerosis (ICD10-I70.0).   Acute blood loss anemia - Hgb - 8.8->7.9->8.2->8.8->9.8->7.4->transfused ->9.2->9.2->10.0  CKD - stage 3b - creatinine - 1.30->1.23->1.48->1.13->1.05>1.11  Dementia - lives with son  Hypokalemia K 4.3-3.3-2.9->3.3 - supplement and recheck in AM ->3.9  DISCHARGE EXAM Vitals:   10/13/20 1945 10/14/20 0038 10/14/20 0517 10/14/20 0751  BP: (!) 164/64 (!) 116/94 (!) 169/60 Marland Kitchen)  108/50  Pulse: 64 64 65 64  Resp: 20 20 18 20   Temp: 98.7 F (37.1 C) 97.7 F (36.5 C) 98.1 F (36.7 C) 97.8 F (36.6 C)  TempSrc: Oral Oral Oral   SpO2: 98% 98% 100% 100%  Weight:      Height:       General - Well nourished, well developed, mild SOB with exertional tachypnea  Ophthalmologic - fundi not  visualized due to noncooperation.  Cardiovascular - irregularly irregular heart rate and rhythm  Neuro - eyes open, moderate to severe dysarthria but able to answer questions. Orientated to self, age, situation but not to year or month. However, able to follow simple commands, able to name and repeat but with very dysarthric and SOB with talking. Eyes able to gaze bilaterally, visual field full. PERRL. Left facial droop. Tongue protrusion midline. RUE 4/5, LUE 4-/5 without drift and finger grip and wrist extension 3/5. BLE 3-/5, able to hold knee flexion and foot on bed position but LLE weaker than left. No babinski bilaterally. Sensation subjectively symmetrical, coordination slow but grossly intact bilaterally and gait not tested.  Discharge Diet   Diet Order            DIET - DYS 1 Room service appropriate? Yes with Assist; Fluid consistency: Thin  Diet effective now                liquids  DISCHARGE PLAN  Disposition:  Discharge to [place] for ongoing PT, OT and ST  aspirin 81 mg daily for secondary stroke prevention.  Recommend ongoing risk factor control by Primary Care Physician at time of discharge from inpatient rehabilitation.  Follow-up Rosita Fire, MD in 2 weeks following discharge from rehab.  Follow-up in [facility] in 4 weeks following discharge from rehab, office to schedule an appointment.   25 minutes were spent preparing discharge.  Mikey Bussing PA-C [facility] Pager [phone] 10/15/2020, 3:21 PM

## 2020-10-15 ENCOUNTER — Inpatient Hospital Stay (HOSPITAL_COMMUNITY): Payer: PPO

## 2020-10-15 ENCOUNTER — Inpatient Hospital Stay (HOSPITAL_COMMUNITY): Payer: PPO | Admitting: Speech Pathology

## 2020-10-15 DIAGNOSIS — I63511 Cerebral infarction due to unspecified occlusion or stenosis of right middle cerebral artery: Secondary | ICD-10-CM

## 2020-10-15 LAB — GLUCOSE, CAPILLARY
Glucose-Capillary: 105 mg/dL — ABNORMAL HIGH (ref 70–99)
Glucose-Capillary: 103 mg/dL — ABNORMAL HIGH (ref 70–99)
Glucose-Capillary: 93 mg/dL (ref 70–99)
Glucose-Capillary: 102 mg/dL — ABNORMAL HIGH (ref 70–99)

## 2020-10-15 MED ORDER — ENOXAPARIN SODIUM 40 MG/0.4ML ~~LOC~~ SOLN
40.0000 mg | SUBCUTANEOUS | Status: DC
  Administered 2020-10-15 – 2020-10-22 (×8): 40 mg via SUBCUTANEOUS
  Filled 2020-10-15 (×9): qty 0.4

## 2020-10-15 MED ORDER — CLONIDINE HCL 0.1 MG PO TABS
0.1000 mg | ORAL_TABLET | Freq: Four times a day (QID) | ORAL | Status: DC | PRN
  Administered 2020-10-16 – 2020-10-24 (×4): 0.1 mg via ORAL
  Filled 2020-10-15 (×6): qty 1

## 2020-10-15 NOTE — Evaluation (Signed)
Speech Language Pathology Assessment and Plan  Patient Details  Name: Fernando [name] MRN: [id number] Date of Birth: May 14, 1927  SLP Diagnosis: Dysarthria;Voice disorder;Cognitive Impairments;Dysphagia  Rehab Potential: Good ELOS: 3-3.5 weeks    Today's Date: 10/15/2020 SLP Individual Time: 6160-7371 SLP Individual Time Calculation (min): 27 min   Hospital Problem: Active Problems:   Right middle cerebral artery stroke [facility])  Past Medical History:  Past Medical History:  Diagnosis Date  . Arthritis   . Atrial fibrillation ([place])   . Carotid artery disease ([place])    Dr. Kellie [name] - s/p left CEA  . Coronary artery disease   . Essential hypertension, benign   . Hyperlipidemia   . 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 This mass was initially felt to be 1.7 cm at the time of presentation but had enlarged significantly to 4 cm clinically at the time of surgery. Pathologically it was 3.4 cm. A lymph node deep to the parotid was enlarged clinically at the time of surgery but was negative   . Tachycardia-bradycardia syndrome [facility])    Past Surgical History:  Past Surgical History:  Procedure Laterality Date  . CAROTID ENDARTERECTOMY Left Jan. 3, 2009   CE  . CATARACT EXTRACTION W/ INTRAOCULAR LENS IMPLANT     Bilateral  . ESOPHAGOGASTRODUODENOSCOPY N/A 03/29/2018   Procedure: ESOPHAGOGASTRODUODENOSCOPY (EGD);  Surgeon: Fernando Binder, MD;  [facility];  Service: Endoscopy;  Laterality: N/A;  . EYE SURGERY    . INSERT / REPLACE / REMOVE PACEMAKER    . IR INTRAVSC STENT CERV CAROTID W/EMB-PROT MOD SED INCL ANGIO  10/06/2020  . IR US GUIDE VASC ACCESS LEFT  10/06/2020  . IR US GUIDE VASC ACCESS RIGHT  10/06/2020  . IR US GUIDE VASC ACCESS RIGHT  10/06/2020  . PACEMAKER INSERTION  10-01-2012  . PARATHYROIDECTOMY  10/28/2012  .  PAROTIDECTOMY  10/28/2012   Procedure: PAROTIDECTOMY;  Surgeon: Fernando Dike, MD;  Location: [place];  Service: ENT;  Laterality: Right;  Total Parotidectomy  . PERMANENT PACEMAKER INSERTION N/A 10/01/2012   Procedure: PERMANENT PACEMAKER INSERTION;  Surgeon: Fernando Lance, MD;  Location: [facility] CATH LAB;  Service: Cardiovascular;  Laterality: N/A;  . RADIOLOGY WITH ANESTHESIA N/A 10/07/2020   Procedure: MRI WITH ANESTHESIA;  Surgeon: Radiologist, Medication, MD;  Location: [place];  Service: Radiology;  Laterality: N/A;  . RADIOLOGY WITH ANESTHESIA N/A 10/06/2020   Procedure: IR WITH ANESTHESIA;  Surgeon: Fernando Bras, MD;  Location: [place];  Service: Radiology;  Laterality: N/A;  . TEE WITHOUT CARDIOVERSION N/A 10/10/2020   Procedure: TRANSESOPHAGEAL ECHOCARDIOGRAM (TEE);  Surgeon: Fernando Perla, MD;  Location: [facility] ENDOSCOPY;  Service: Cardiovascular;  Laterality: N/A;    Assessment / Plan / Recommendation Clinical Impression   Fernando [name] is a 86 year old right-handed male with history of atrial fibrillation maintained on Xarelto, hypertension,CKD stage III1.30-1.48, history of GI bleed 2019,CAD with pacemaker, hyperlipidemia, prior left CEA, SCC of the carotid gland and recent TIA August 2021.He quit smoking 47 years ago. Per chart review lives with children. He was receiving assistance for ADLs and home care. 1 level home 6 steps to entry. Presented 10/07/2020 [facility] left-sided weakness as well as dysarthria. Admission chemistries glucose 172 BUN 37 creatinine 1.49, lactic acid 5.0, BNP 1097, WBC 17,100, hemoglobin 9.1, hemoglobin A1c 5.7. Cranial CT scan showed hyperdense right MCA suspicious for emergent large vessel  occlusion. No acute cortically based infarct or acute intracranial hemorrhage. Patient did not receive TPA. Carotid Doppler with right ICA stenosis 80-99%. Echocardiogram with ejection fraction of 40 to 45% without thrombus.TEE showed akinesis of  the distal septum and apex ejection fraction 50% no thrombus noted. No vegetation. CTAof the head and neck no emergent large vessel occlusion. 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 Delayed transit throughout the entire right cerebral hemisphere and left ACA territory related to high-grade proximal right ICA stenosis and chronically diminutive disease left ACA. Patient underwent ICA angioplasty per interventional radiology. He did require short-term intubation. Hospital course fever blood cultures 2/2 G+ cocci maintained on Rocephin. He was cleared to begin aspirin for CVA prophylaxis for right MCA infarction due to right ICA high-grade stenosisand AWAIT PLAN TO [place].Currently on a dysphagia #1 thin liquid diet.Blood culture 2/2 group B strep pansensitive placed on Ancef. Pt admitted to CIR 10/14/20 and SLP evaluations were completed 10/15/20 with results as follows:  Pt presents with oropharyngeal dysphagia characterized by weak oral and lingual manipulation of boluses, as well as mild lingual residue. Per last MBSS, pt with pharyngeal deficits s/p structural abnormalities as well as new weakness in setting of acute CVA. Today pt exhibited consistent dry coughing both at baseline and in the presence of POs. Per last MBSS, penetration of most consistencies occurred, but no aspiration. Purees, thins, and nectar thick liquids all results in very similar clinical presentation today, as far as cough response and oral control of boluses. He also exhibited increased WOB with very minimal PO consumption - RN made aware and she reports his lungs sounds have been clear. Despite clinical presentation and minimal PO intake, he appears to be tolerating purees and thins at this time, however ST will continue to monitor tolerance. SLP located dentures, however pt reports they do not fit anymore and refused to donn. Recommend continue current diet for now, full supervision, take rest breaks as  needed due coordinate breathing and swallowing sequence, follow solids with liquids.  Pt also presents with moderate dysarthria and voice deficits characterized by strained/strangled vocal quality and articulatory imprecision. Articulation may have also been influence by lack of dentition. Will encourage pt to attempt to donn dentures in future sessions. His speech intelligibility was reduced to ~35-40% at the phrase level with this SLP. Expressive and receptive language skills were determined grossly WFL, however pt reports he is hard of hearing, which can impact his auditory comprehension at times. Although no family was present to confirm cognitive baseline, he demonstrated deficits in functional recall and problem solving throughout session (ex: how to use call bell). Pt was oriented to self, place, and situation, but required cues for orientation to time.   Recommend pt receive skilled ST to address dysphagia, dysarthria, voice, and cognitive deficits prior to his discharge home in order to maximize his safety, functional independence, and communication.   Skilled Therapeutic Interventions          Bedside swallow and cognitive-linguistic evaluations were administered and results were reviewed with pt (please see above for details regarding results).    SLP Assessment  Patient will need skilled Speech Lanaguage Pathology Services during CIR admission    Recommendations  SLP Diet Recommendations: Thin;Dysphagia 1 (Puree) Liquid Administration via: Cup;Straw [name] Administration: Crushed with puree Supervision: Full supervision/cueing for compensatory strategies;Staff to assist with self feeding Compensations: Hard cough after swallow;Follow solids with liquid;Slow rate;Small sips/bites;Minimize environmental distractions Postural Changes and/or Swallow Maneuvers: Seated upright 90 degrees;Upright 30-60 min  after meal Oral Care Recommendations: Oral care QID;Oral care prior to ice  chip/H20 Patient destination: Home Follow up Recommendations: 24 hour supervision/assistance;Home Health SLP Equipment Recommended: None recommended by SLP    SLP Frequency 3 to 5 out of 7 days   SLP Duration  SLP Intensity  SLP Treatment/Interventions 3-3.5 weeks  Minumum of 1-2 x/day, 30 to 90 minutes  Cognitive remediation/compensation;Cueing hierarchy;Dysphagia/aspiration precaution training;Functional tasks;Patient/family education;Therapeutic Activities;Internal/external aids;Speech/Language facilitation    Pain Pain Assessment Pain Scale: Faces Pain Score: 0-No pain Faces Pain Scale: No hurt      SLP Evaluation Cognition Overall Cognitive Status: No family/caregiver present to determine baseline cognitive functioning Arousal/Alertness: Lethargic Orientation Level: Oriented to person;Oriented to place;Oriented to situation;Disoriented to time Attention: Selective Selective Attention: Impaired Selective Attention Impairment: Verbal basic;Functional basic Awareness: Impaired Awareness Impairment: Intellectual impairment Problem Solving: Impaired Problem Solving Impairment: Functional basic;Verbal basic Executive Function:  (all impaired due to lower level deficits) Safety/Judgment: Impaired  Comprehension Auditory Comprehension Overall Auditory Comprehension: Other (comment) (difficult to assess, question hearing loss - frequently required directions and questions to be repeated) Yes/No Questions: Within Functional Limits Commands: Impaired One Step Basic Commands: 50-74% accurate Conversation: Simple Interfering Components: Attention (hearing loss?) EffectiveTechniques: Repetition;Increased volume;Extra processing time;Slowed speech Visual Recognition/Discrimination Discrimination: Not tested Reading Comprehension Reading Status: Not tested Expression Expression Primary Mode of Expression: Verbal Verbal Expression Overall Verbal Expression: Appears within  functional limits for tasks assessed Non-Verbal Means of Communication: Not applicable Written Expression Written Expression: Not tested Oral Motor Oral Motor/Sensory Function Overall Oral Motor/Sensory Function: Moderate impairment Facial ROM: Reduced left Facial Symmetry: Abnormal symmetry left Lingual ROM: Reduced right;Reduced left Lingual Symmetry: Within Functional Limits Velum: Within Functional Limits Mandible: Within Functional Limits Motor Speech Overall Motor Speech: Impaired Respiration: Impaired Level of Impairment: Phrase Phonation: Breathy;Low vocal intensity Resonance: Within functional limits Articulation: Impaired Level of Impairment: Word Intelligibility: Intelligibility reduced Word: 50-74% accurate Phrase: 25-49% accurate Sentence: 25-49% accurate Conversation: 25-49% accurate Motor Planning: Witnin functional limits Motor Speech Errors: Not applicable  Care Tool Care Tool Cognition Expression of Ideas and Wants Expression of Ideas and Wants: Frequent difficulty - frequently exhibits difficulty with expressing needs and ideas   Understanding Verbal and Non-Verbal Content Understanding Verbal and Non-Verbal Content: Sometimes understands - understands only basic conversations or simple, direct phrases. Frequently requires cues to understand   Memory/Recall Ability *first 3 days only Memory/Recall Ability *first 3 days only: That he or she is in a hospital/hospital unit;Current season     Intelligibility: Intelligibility reduced Word: 50-74% accurate Phrase: 25-49% accurate Sentence: 25-49% accurate Conversation: 25-49% accurate  Bedside Swallowing Assessment General Date of Onset: 10/07/20 Previous Swallow Assessment: BSE 10/08/20, MBS 10/09/20 Diet Prior to this Study: Dysphagia 1 (puree);Thin liquids Temperature Spikes Noted: No Respiratory Status: Room air History of Recent Intubation: Yes Length of Intubations (days): 1 days Date  extubated: 10/07/20 Behavior/Cognition: Pleasant mood;Cooperative;Lethargic/Drowsy;Distractible Oral Cavity - Dentition: Edentulous;Dentures, not available Self-Feeding Abilities: Needs assist Patient Positioning: Upright in bed Baseline Vocal Quality: Low vocal intensity;Other (comment) (strained/strangled) Volitional Cough: Weak Volitional Swallow: Able to elicit  Oral Care Assessment Does patient have any of the following "high(er) risk" factors?: Tongue - red, shiny, blistered, cracked (pt with some thick/crusty black dried material on tongue and palate) Does patient have any of the following "at risk" factors?: Lips - dry, cracked;Tongue - coated;Other - dysphagia Patient is HIGH RISK: Non-ventilated: Order set for Adult Oral Care Protocol initiated - "High Risk Patients - Non-Ventilated" option selected  (see row information)  Patient is AT RISK: Order set for Adult Oral Care Protocol initiated -  "At Risk Patients" option selected (see row information) Ice Chips Ice chips: Not tested Thin Liquid Thin Liquid: Impaired Presentation: Cup;Straw Oral Phase Impairments: Reduced lingual movement/coordination;Reduced labial seal Pharyngeal  Phase Impairments: Cough - Immediate;Multiple swallows Nectar Thick Nectar Thick Liquid: Impaired Presentation: Straw Oral Phase Impairments: Reduced lingual movement/coordination Pharyngeal Phase Impairments: Cough - Immediate;Multiple swallows Honey Thick Honey Thick Liquid: Not tested Puree Puree: Impaired Presentation: Spoon;Self Fed Oral Phase Impairments: Reduced lingual movement/coordination Oral Phase Functional Implications: Oral residue Pharyngeal Phase Impairments: Multiple swallows;Cough - Delayed;Cough - Immediate Solid Solid: Not tested BSE Assessment Suspected Esophageal Findings Suspected Esophageal Findings: Belching Risk for Aspiration Impact on safety and function: Moderate aspiration risk Other Related Risk Factors:  History of dysphagia;Cognitive impairment;Decreased respiratory status  Short Term Goals: Week 1: SLP Short Term Goal 1 (Week 1): Pt will consume current diet with appreciable change in respiratory status or other vitals to indicate tolerance, and Mod A cues for use of compensatory swallow stategies. SLP Short Term Goal 2 (Week 1): Pt will consume trials of Dys 2 textures demonstrating efficinent mastication and oral clearance X3 prior to upgrade. SLP Short Term Goal 3 (Week 1): Pt will use compensatory strategies to increase speech intelligibility to 50% at the word level with Max A multimodal cues. SLP Short Term Goal 4 (Week 1): Pt will recall 1 safety precaution with Mod A cues for use of strategies and/or aids. SLP Short Term Goal 5 (Week 1): Pt will demonstrate ability to problem solve basic functional and familiar situations with Mod A cues. SLP Short Term Goal 6 (Week 1): Pt will identify 1 physical and 1 cognitive-linguistic deficit in setting of acute CVA with Mod A cues.  Refer to Care Plan for Long Term Goals  Recommendations for other services: None   Discharge Criteria: Patient will be discharged from SLP if patient refuses treatment 3 consecutive times without medical reason, if treatment goals not met, if there is a change in medical status, if patient makes no progress towards goals or if patient is discharged from hospital.  The above assessment, treatment plan, treatment alternatives and goals were discussed and mutually agreed upon: by patient  [name] 10/15/2020, 12:33 PM

## 2020-10-15 NOTE — Progress Notes (Signed)
[place] PHYSICAL MEDICINE & REHABILITATION PROGRESS NOTE   Subjective/Complaints:  Remains severely dysarthric and HOH, no cough, occ tachypnea  ROS- cannot obtain due to severe dysarthria   Objective:   No results found. Recent Labs    10/13/20 0246 10/14/20 1756  WBC 10.7* 14.8*  HGB 9.2* 10.0*  HCT 29.2* 32.8*  PLT 248 325   Recent Labs    10/13/20 0246 10/14/20 0327  NA 145 143  K 3.3* 3.9  CL 114* 112*  CO2 22 20*  GLUCOSE 115* 105*  BUN 30* 26*  CREATININE 1.11 1.03  CALCIUM 7.9* 7.6*    Intake/Output Summary (Last 24 hours) at 10/15/2020 1015 Last data filed at 10/15/2020 0600 Gross per 24 hour  Intake 277 ml  Output 500 ml  Net -223 ml     Pressure Injury 10/14/20 Buttocks Left Deep Tissue Pressure Injury - Purple or maroon localized area of discolored intact skin or blood-filled blister due to damage of underlying soft tissue from pressure and/or shear. Purple/red area small fluid filled  (Active)  10/14/20 1700  Location: Buttocks  Location Orientation: Left  Staging: Deep Tissue Pressure Injury - Purple or maroon localized area of discolored intact skin or blood-filled blister due to damage of underlying soft tissue from pressure and/or shear.  Wound Description (Comments): Purple/red area small fluid filled blister noted at center  Present on Admission: Yes    Physical Exam: Vital Signs Blood pressure (!) 177/66, pulse 63, temperature 99.5 F (37.5 C), temperature source Oral, resp. rate 20, height 5\' 5"  (1.651 m), weight 70.9 kg, SpO2 93 %.   General: No acute distress Mood and affect are appropriate Heart: Regular rate and rhythm no rubs murmurs or extra sounds Lungs: Clear to auscultation, breathing unlabored, no rales or wheezes Abdomen: Positive bowel sounds, soft nontender to palpation, nondistended Extremities: No clubbing, cyanosis, mild dorsum Left hand  edema Skin: No evidence of breakdown, no evidence of rash Neurologic:  Cranial nerves II through XII intact, motor strength is 4+/5 in right 4- left deltoid, bicep, tricep, grip, hip flexor, knee extensors, ankle dorsiflexor and plantar flexor Sensory exam normal sensation to light touch and proprioception in bilateral upper and lower extremities  Musculoskeletal: Full range of motion in all 4 extremities. No joint swelling   Assessment/Plan: 1. Functional deficits which require 3+ hours per day of interdisciplinary therapy in a comprehensive inpatient rehab setting.  Physiatrist is providing close team supervision and 24 hour management of active medical problems listed below.  Physiatrist and rehab team continue to assess barriers to discharge/monitor patient progress toward functional and medical goals  Care Tool:  Bathing              Bathing assist       Upper Body Dressing/Undressing Upper body dressing        Upper body assist      Lower Body Dressing/Undressing Lower body dressing            Lower body assist       Toileting Toileting    Toileting assist Assist for toileting: Total Assistance - Patient < 25%     Transfers Chair/bed transfer  Transfers assist           Locomotion Ambulation   Ambulation assist              Walk 10 feet activity   Assist           Walk 50 feet activity   Assist  Walk 150 feet activity   Assist           Walk 10 feet on uneven surface  activity   Assist           Wheelchair     Assist               Wheelchair 50 feet with 2 turns activity    Assist            Wheelchair 150 feet activity     Assist          Blood pressure (!) 177/66, pulse 63, temperature 99.5 F (37.5 C), temperature source Oral, resp. rate 20, height 5\' 5"  (1.651 m), weight 70.9 kg, SpO2 93 %.    Medical Problem List and Plan: 1.Left side weakness dysarthria/dysphagiasecondary to right MCA infarction due to right ICA  high-grade stenosis status post angioplasty -patient may  shower -ELOS/Goals: 17 to 21 days 2. Antithrombotics: -DVT/anticoagulation: Per neurology stopping Xarelto-antiplatelet therapy: Aspirin81 mg daily 3. Pain Management:Tylenol as needed 4. Mood:Provide emotional support -antipsychotic agents: N/A 5. Neuropsych: This patientiscapable of making decisions on hisown behalf. 6. Skin/Wound Care:Routine skin checks 7. Fluids/Electrolytes/Nutrition:Routine in and outs with follow-up chemistries, poor intake start IVF at noc  8. Atrial fibrillation/tachybradycardia syndrome/pacemaker. Cardiac rate controlled. Continue Toprol25 mg daily 9. SCC of the carotid gland. Follow-up outpatient 10. Hypertension. Toprol 25 mg daily. Monitor with increased mobility Vitals:   10/14/20 1929 10/15/20 0300  BP: (!) 186/67 (!) 177/66  Pulse: 62 63  Resp: 18 20  Temp: 99 F (37.2 C) 99.5 F (37.5 C)  SpO2: 98% 93%   11. Hypothyroidism. Synthroid 12. ?Bacteremia but neg cultures .Ancef 2 g every 8 hours and complete10-daycourse, afebrile (74F) but has leufocytosis, check CXR 13. Hyperlipidemia. Lipitor 14. Dysphagia. Dysphagia #1 thin liquids. Follow-up speech therapy 15.CKD stage III. Baseline 1.30-1.48. Follow-up chemistries 16. Acute blood loss anemia with history of GI bleed  Patient transfused 1 unit packed red blood cells 10/12/2020. Continue iron supplement. Protonix 40 mg twice daily. Follow-up CBC-10.7 on 11/20 17. Prediabetes. Hemoglobin A1c 5.7. SSI  LOS: 1 days A FACE TO FACE EVALUATION WAS PERFORMED  Charlett Blake 10/15/2020, 10:15 AM

## 2020-10-15 NOTE — Evaluation (Addendum)
Physical Therapy Assessment and Plan  Patient Details  Name: Fernando [name] MRN: [id number] Date of Birth: 1927/05/26  PT Diagnosis: Abnormal posture, Difficulty walking, Edema, Hemiplegia dominant, Impaired cognition, Muscle weakness and Pain in joint Rehab Potential: Good ELOS: 21-24 days   Today's Date: 10/15/2020 PT Individual Time: 1300-1400 PT Individual Time Calculation (min): 60 min    Hospital Problem: Active Problems:   Right middle cerebral artery stroke [facility])   Past Medical History:  Past Medical History:  Diagnosis Date  . Arthritis   . Atrial fibrillation ([place])   . Carotid artery disease ([place])    Dr. Kellie Simmering - s/p left CEA  . Coronary artery disease   . Essential hypertension, benign   . Hyperlipidemia   . 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 This mass was initially felt to be 1.7 cm at the time of presentation but had enlarged significantly to 4 cm clinically at the time of surgery. Pathologically it was 3.4 cm. A lymph node deep to the parotid was enlarged clinically at the time of surgery but was negative   . Tachycardia-bradycardia syndrome [facility])    Past Surgical History:  Past Surgical History:  Procedure Laterality Date  . CAROTID ENDARTERECTOMY Left Jan. 3, 2009   CE  . CATARACT EXTRACTION W/ INTRAOCULAR LENS IMPLANT     Bilateral  . ESOPHAGOGASTRODUODENOSCOPY N/A 03/29/2018   Procedure: ESOPHAGOGASTRODUODENOSCOPY (EGD);  Surgeon: Danie Binder, MD;  Location[facility];  Service: Endoscopy;  Laterality: N/A;  . EYE SURGERY    . INSERT / REPLACE / REMOVE PACEMAKER    . IR INTRAVSC STENT CERV CAROTID W/EMB-PROT MOD SED INCL ANGIO  10/06/2020  . IR US GUIDE VASC ACCESS LEFT  10/06/2020  . IR US GUIDE VASC ACCESS RIGHT  10/06/2020  . IR US GUIDE VASC ACCESS RIGHT  10/06/2020  . PACEMAKER INSERTION   10-01-2012  . PARATHYROIDECTOMY  10/28/2012  . PAROTIDECTOMY  10/28/2012   Procedure: PAROTIDECTOMY;  Surgeon: Ascencion Dike, MD;  Location: [place];  Service: ENT;  Laterality: Right;  Total Parotidectomy  . PERMANENT PACEMAKER INSERTION N/A 10/01/2012   Procedure: PERMANENT PACEMAKER INSERTION;  Surgeon: Evans Lance, MD;  [facility] CATH LAB;  Service: Cardiovascular;  Laterality: N/A;  . RADIOLOGY WITH ANESTHESIA N/A 10/07/2020   Procedure: MRI WITH ANESTHESIA;  Surgeon: Radiologist, Medication, MD;  Location: [place];  Service: Radiology;  Laterality: N/A;  . RADIOLOGY WITH ANESTHESIA N/A 10/06/2020   Procedure: IR WITH ANESTHESIA;  Surgeon: Luanne Bras, MD;  Location: [place];  Service: Radiology;  Laterality: N/A;  . TEE WITHOUT CARDIOVERSION N/A 10/10/2020   Procedure: TRANSESOPHAGEAL ECHOCARDIOGRAM (TEE);  Surgeon: Lelon Perla, MD;  Location: [facility] ENDOSCOPY;  Service: Cardiovascular;  Laterality: N/A;    Assessment & Plan Clinical Impression: Patient is a 86 year old right-handed male with history of atrial fibrillation maintained on Xarelto, hypertension,CKD stage III1.30-1.48, history of GI bleed 2019,CAD with pacemaker, hyperlipidemia, prior left CEA, SCC of the carotid gland and recent TIA August 2021.He quit smoking 47 years ago. Per chart review lives with children. He was receiving assistance for ADLs and home care. 1 level home 6 steps to entry. Presented 10/07/2020 [facility] left-sided weakness as well as dysarthria. Admission chemistries glucose 172 BUN 37 creatinine 1.49, lactic acid 5.0, BNP 1097, WBC 17,100, hemoglobin 9.1, hemoglobin A1c 5.7. Cranial CT scan showed hyperdense right  MCA suspicious for emergent large vessel occlusion. No acute cortically based infarct or acute intracranial hemorrhage. Patient did not receive TPA. Carotid Doppler with right ICA stenosis 80-99%. Echocardiogram with ejection fraction of 40 to 45% without thrombus.TEE  showed akinesis of the distal septum and apex ejection fraction 50% no thrombus noted. No vegetation. CTAof the head and neck no emergent large vessel occlusion. 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 Delayed transit throughout the entire right cerebral hemisphere and left ACA territory related to high-grade proximal right ICA stenosis and chronically diminutive disease left ACA. Patient underwent ICA angioplasty per interventional radiology. He did require short-term intubation. Hospital course fever blood cultures 2/2 G+ cocci maintained on Rocephin. He was cleared to begin aspirin for CVA prophylaxis for right MCA infarction due to right ICA high-grade stenosisand AWAIT PLAN TO [place].Currently on a dysphagia #1 thin liquid diet.Blood culture 2/2 group B strep pansensitive placed on Ancef therapy evaluations completed. Patient transferred to CIR on 10/14/2020 .   Patient currently requires total with mobility secondary to muscle weakness and muscle joint tightness, decreased cardiorespiratoy endurance, impaired timing and sequencing, decreased coordination and decreased motor planning, decreased attention to left, decreased initiation, decreased attention, decreased awareness, decreased problem solving, decreased safety awareness and decreased memory and decreased sitting balance, decreased standing balance, decreased postural control, hemiplegia and decreased balance strategies.  Prior to hospitalization, patient was supervision to min assist with mobility and lived with Son in a House home.  Home access is 5Stairs to enter.  Patient will benefit from skilled PT intervention to maximize safe functional mobility, minimize fall risk and decrease caregiver burden for planned discharge home with 24 hour assist.  Anticipate patient will benefit from follow up [facility] at discharge.  PT - End of Session Activity Tolerance: Decreased this session Endurance Deficit: Yes Endurance  Deficit Description: fatigues easily  PT Assessment Rehab Potential (ACUTE/IP ONLY): Good PT Barriers to Discharge: [place] home environment;Incontinence PT Barriers to Discharge Comments: family looking into a ramp (currenlty has 5 steps) PT Patient demonstrates impairments in the following area(s): Balance;Edema;Endurance;Motor;Pain;Perception;Safety;Sensory;Skin Integrity PT Transfers Functional Problem(s): Bed Mobility;Bed to Chair;Car;Furniture PT Locomotion Functional Problem(s): Ambulation;Wheelchair Mobility;Stairs PT Plan PT Intensity: Minimum of 1-2 x/day ,45 to 90 minutes PT Frequency: 5 out of 7 days (may require 15/7 pending endurance) PT Duration Estimated Length of Stay: 21-24 days PT Treatment/Interventions: Ambulation/gait training;Balance/vestibular training;Cognitive remediation/compensation;Community reintegration;Discharge planning;DME/adaptive equipment instruction;Disease management/prevention;Functional mobility training;Pain management;Neuromuscular re-education;Patient/family education;Psychosocial support;Skin care/wound management;Splinting/orthotics;Stair training;Therapeutic Activities;Therapeutic Exercise;UE/LE Strength taining/ROM;UE/LE Coordination activities;Visual/perceptual remediation/compensation;Wheelchair propulsion/positioning PT Transfers Anticipated Outcome(s): min assist PT Locomotion Anticipated Outcome(s): supervision w/c mobility; min assist short distance gait PT Recommendation Recommendations for Other Services: Therapeutic Recreation consult Follow Up Recommendations: Home health PT;24 hour supervision/assistance Patient destination: Home Equipment Recommended: Wheelchair cushion (measurements);Wheelchair (measurements) Equipment Details: has RW   PT Evaluation Precautions/Restrictions Precautions Precautions: Fall;ICD/Pacemaker Precaution Comments: L sided weakness; very HOH; dysarthria Restrictions Weight Bearing Restrictions:  No General   Vital Signs Pain Pain Assessment Pain Scale: 0-10 Pain Score: 0-No pain Faces Pain Scale: No hurt Home Living/Prior Functioning Home Living Available Help at Discharge: Family;Available 24 hours/day Type of Home: House Home Access: Stairs to enter [employer] of Steps: 5 Entrance Stairs-Rails: Right Home Layout: One level Bathroom Shower/Tub: Chiropodist: Handicapped height Bathroom Accessibility: Yes Additional Comments: son reports they are looking into building a ramp   Lives With: Son Prior Function Level of Independence: Needs assistance with ADLs;Needs assistance with tranfers;Requires assistive device for independence  Able to  Take Stairs?: Yes Driving: Yes (occasionally) Vocation: Retired Comments: Pt's son reports he was assisting with ADLs and transfers, pt had good and bad days Vision/Perception  Perception Perception: Impaired Inattention/Neglect:  (L inattention) Praxis Praxis: Impaired Praxis Impairment Details: Initiation;Motor planning  Cognition Overall Cognitive Status: History of cognitive impairments - at baseline Arousal/Alertness: Awake/alert Orientation Level: Oriented to person;Oriented to place;Oriented to situation;Disoriented to time Attention: Selective Selective Attention: Impaired Selective Attention Impairment: Verbal basic;Functional basic Memory: Impaired Memory Impairment: Decreased recall of new information Immediate Memory Recall: Sock;Bed;Blue Memory Recall Sock: Not able to recall Memory Recall Blue: Not able to recall Memory Recall Bed: Not able to recall Awareness: Impaired Awareness Impairment: Intellectual impairment Problem Solving: Impaired Problem Solving Impairment: Functional basic;Verbal basic Executive Function:  (all impaired) Safety/Judgment: Impaired Sensation Sensation Light Touch:  (unable to determine d/t cognitive and expression language deficits) Additional  Comments: pt reports sensation is intact; further assessment needed Coordination Gross Motor Movements are Fluid and Coordinated: No Fine Motor Movements are Fluid and Coordinated: No Coordination and Movement Description: gross debility Motor  Motor Motor: Hemiplegia;Abnormal postural alignment and control Motor - Skilled Clinical Observations: L hemiplegia; general debility   Trunk/Postural Assessment  Cervical Assessment Cervical Assessment: Exceptions to [facility] (forward head) Thoracic Assessment Thoracic Assessment: Exceptions to [facility] (kyphotic posture, likely baseline) Lumbar Assessment Lumbar Assessment: Exceptions to [facility] (posterior pelvic tilt) Postural Control Postural Control: Deficits on evaluation Righting Reactions: delayed and inadequate Protective Responses: delayed and inadequate  Balance Balance Balance Assessed: Yes Static Sitting Balance Static Sitting - Balance Support: Feet supported;Bilateral upper extremity supported Static Sitting - Level of Assistance: 4: Min assist Dynamic Sitting Balance Dynamic Sitting - Balance Support: During functional activity;Feet supported;Bilateral upper extremity supported Dynamic Sitting - Level of Assistance: 3: Mod assist Static Standing Balance Static Standing - Balance Support: During functional activity;Bilateral upper extremity supported Static Standing - Level of Assistance: 1: +2 Total assist Dynamic Standing Balance Dynamic Standing - Balance Support: During functional activity;Bilateral upper extremity supported Dynamic Standing - Level of Assistance: 1: +2 Total assist   PASS: Maintaining posture = 2; Changing a posture = 4; total = 6  Extremity Assessment  RUE Assessment RUE Assessment: Exceptions to [facility] General Strength Comments: generalized weakness, 3+/5, limited shoulder AROM LUE Assessment LUE Assessment: Exceptions to [facility] General Strength Comments: Pt has isolated movement in his LUE, it remains weak, 3/5  MMT LUE Body System: Neuro Brunstrum levels for arm and hand: Arm;Hand Brunstrum level for arm: Stage IV Movement is deviating from synergy Brunstrum level for hand: Stage V Independence from basic synergies RLE Assessment RLE Assessment: Exceptions to [facility] General Strength Comments: grossly observed 3+/5; proximal weakness greater LLE Assessment LLE Assessment: Exceptions to [facility] General Strength Comments: grossly observed 3 to 3+/5; proximal weakness greater  Care Tool Care Tool Bed Mobility Roll left and right activity   Roll left and right assist level: Maximal Assistance - Patient 25 - 49%    Sit to lying activity   Sit to lying assist level: Maximal Assistance - Patient 25 - 49%    Lying to sitting edge of bed activity   Lying to sitting edge of bed assist level: Maximal Assistance - Patient 25 - 49%     Care Tool Transfers Sit to stand transfer   Sit to stand assist level: 2 Helpers    Chair/bed transfer   Chair/bed transfer assist level: 2 Pension scheme manager transfer   Assist Level: 2 Customer service manager  transfer Car transfer activity did not occur: Safety/medical concerns        Care Tool Locomotion Ambulation Ambulation activity did not occur: Safety/medical concerns        Walk 10 feet activity Walk 10 feet activity did not occur: Safety/medical concerns       Walk 50 feet with 2 turns activity        Walk 150 feet activity Walk 150 feet activity did not occur: Safety/medical concerns      Walk 10 feet on uneven surfaces activity Walk 10 feet on uneven surfaces activity did not occur: Safety/medical concerns      Stairs Stair activity did not occur: Safety/medical concerns        Walk up/down 1 step activity Walk up/down 1 step or curb (drop down) activity did not occur: Safety/medical concerns     Walk up/down 4 steps activity did not occuR: Safety/medical concerns  Walk up/down 4 steps activity      Walk up/down 12 steps activity Walk up/down 12  steps activity did not occur: Safety/medical concerns      Pick up small objects from floor Pick up small object from the floor (from standing position) activity did not occur: Safety/medical concerns      Wheelchair   Type of Wheelchair: Manual   Wheelchair assist level: Dependent - Patient 0% (attempts but unable to propel with UE's)    Wheel 50 feet with 2 turns activity   Assist Level: Dependent - Patient 0%  Wheel 150 feet activity   Assist Level: Dependent - Patient 0%    Refer to Care Plan for Long Term Goals  SHORT TERM GOAL WEEK 1 PT Short Term Goal 1 (Week 1): Pt will be able to perform bed mobility with mod assist overall PT Short Term Goal 2 (Week 1): Pt will be able to perform bed <> chair transfer with mod assist PT Short Term Goal 3 (Week 1): Pt will be able to perform sit <> stands with mod +2  Recommendations for other services: Therapeutic Recreation  Other co-treat  Skilled Therapeutic Intervention Mobility Bed Mobility Bed Mobility: Sit to Supine;Supine to Sit Supine to Sit: Maximal Assistance - Patient - Patient 25-49% Sit to Supine: Maximal Assistance - Patient 25-49% Transfers Transfers: Sit to Stand;Stand to Sit;Lateral/Scoot Transfers Sit to Stand: 2 Helpers Stand to Sit: 2 Helpers Lateral/Scoot Transfers: 2 Press photographer (Assistive device):  (slideboard)  During transfer with slideboard, focused on cues for head/hips relationship, hand placement and anterior weightshift. Mod to max assist and +2 for safety.  Attempted x 2 for sit ,> stands EOB with BUE support but unable to achieve upright position and clear bottom. Later in session utilized Stedy with mod +2 and able to come up to standing and maintain a few seconds. Partial sit > stand from perched position with min assist and pt then fatigued.  Seated Kinetron for LE strengthening x 10 reps each x 2 sets with rest break.   Son present during session and educated on rehab process, goals,  stroke recovery, and overall plan in therapies.   Artist / Additional Locomotion Stairs: No Architect: No (attempted with BUE but unable to coordinate/lack of strength)   Discharge Criteria: Patient will be discharged from PT if patient refuses treatment 3 consecutive times without medical reason, if treatment goals not met, if there is a change in medical status, if patient makes no progress towards goals or if patient is discharged from hospital.  The above assessment, treatment plan, treatment alternatives and goals were discussed and mutually agreed upon: by patient and by family  Juanna Cao, PT, DPT, CBIS  10/15/2020, 2:40 PM

## 2020-10-15 NOTE — Evaluation (Signed)
Occupational Therapy Assessment and Plan  Patient Details  Name: Fernando [name] MRN: [id number] Date of Birth: [birth year]  OT Diagnosis: abnormal posture, cognitive deficits, hemiplegia affecting dominant side, muscle weakness (generalized) and swelling of limb Rehab Potential: Rehab Potential (ACUTE ONLY): Good ELOS: 3-4 weeks   Today's Date: 10/15/2020 OT Individual Time: 2423-5361 OT Individual Time Calculation (min): 83 min     Hospital Problem: Active Problems:   Right middle cerebral artery stroke [facility])   Past Medical History:  Past Medical History:  Diagnosis Date  . Arthritis   . Atrial fibrillation ([place])   . Carotid artery disease ([place])    Dr. Kellie Simmering - s/p left CEA  . Coronary artery disease   . Essential hypertension, benign   . Hyperlipidemia   . 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 This mass was initially felt to be 1.7 cm at the time of presentation but had enlarged significantly to 4 cm clinically at the time of surgery. Pathologically it was 3.4 cm. A lymph node deep to the parotid was enlarged clinically at the time of surgery but was negative   . Tachycardia-bradycardia syndrome [facility])    Past Surgical History:  Past Surgical History:  Procedure Laterality Date  . CAROTID ENDARTERECTOMY Left Jan. 3, 2009   CE  . CATARACT EXTRACTION W/ INTRAOCULAR LENS IMPLANT     Bilateral  . ESOPHAGOGASTRODUODENOSCOPY N/A 03/29/2018   Procedure: ESOPHAGOGASTRODUODENOSCOPY (EGD);  Surgeon: Danie Binder, MD;  [facility];  Service: Endoscopy;  Laterality: N/A;  . EYE SURGERY    . INSERT / REPLACE / REMOVE PACEMAKER    . IR INTRAVSC STENT CERV CAROTID W/EMB-PROT MOD SED INCL ANGIO  10/06/2020  . IR US GUIDE VASC ACCESS LEFT  10/06/2020  . IR US GUIDE VASC ACCESS RIGHT  10/06/2020  . IR US GUIDE VASC ACCESS RIGHT   10/06/2020  . PACEMAKER INSERTION  10-01-2012  . PARATHYROIDECTOMY  10/28/2012  . PAROTIDECTOMY  10/28/2012   Procedure: PAROTIDECTOMY;  Surgeon: Ascencion Dike, MD;  Location: [place];  Service: ENT;  Laterality: Right;  Total Parotidectomy  . PERMANENT PACEMAKER INSERTION N/A 10/01/2012   Procedure: PERMANENT PACEMAKER INSERTION;  Surgeon: Evans Lance, MD;  Location: [facility] CATH LAB;  Service: Cardiovascular;  Laterality: N/A;  . RADIOLOGY WITH ANESTHESIA N/A 10/07/2020   Procedure: MRI WITH ANESTHESIA;  Surgeon: Radiologist, Medication, MD;  Location: [place];  Service: Radiology;  Laterality: N/A;  . RADIOLOGY WITH ANESTHESIA N/A 10/06/2020   Procedure: IR WITH ANESTHESIA;  Surgeon: Luanne Bras, MD;  Location: [place];  Service: Radiology;  Laterality: N/A;  . TEE WITHOUT CARDIOVERSION N/A 10/10/2020   Procedure: TRANSESOPHAGEAL ECHOCARDIOGRAM (TEE);  Surgeon: Lelon Perla, MD;  Location: [facility] ENDOSCOPY;  Service: Cardiovascular;  Laterality: N/A;    Assessment & Plan Clinical Impression: Fernando [name] is a 86 year old right-handed male with history of atrial fibrillation maintained on Xarelto, hypertension,CKD stage III1.30-1.48, history of GI bleed 2019,CAD with pacemaker, hyperlipidemia, prior left CEA, SCC of the carotid gland and recent TIA August 2021.He quit smoking 47 years ago. Per chart review lives with children. He was receiving assistance for ADLs and home care. 1 level home 6 steps to entry. Presented 10/07/2020 [facility] left-sided weakness as well as dysarthria. Admission chemistries glucose 172 BUN 37 creatinine 1.49, lactic acid 5.0, BNP 1097, WBC 17,100, hemoglobin 9.1, hemoglobin A1c 5.7.  Cranial CT scan showed hyperdense right MCA suspicious for emergent large vessel occlusion. No acute cortically based infarct or acute intracranial hemorrhage. Patient did not receive TPA. Carotid Doppler with right ICA stenosis 80-99%. Echocardiogram with ejection  fraction of 40 to 45% without thrombus.TEE showed akinesis of the distal septum and apex ejection fraction 50% no thrombus noted. No vegetation. CTAof the head and neck no emergent large vessel occlusion. 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 Delayed transit throughout the entire right cerebral hemisphere and left ACA territory related to high-grade proximal right ICA stenosis and chronically diminutive disease left ACA. Patient underwent ICA angioplasty per interventional radiology. He did require short-term intubation. Hospital course fever blood cultures 2/2 G+ cocci maintained on Rocephin. He was cleared to begin aspirin for CVA prophylaxis for right MCA infarction due to right ICA high-grade stenosisand AWAIT PLAN TO [place].Currently on a dysphagia #1 thin liquid diet.Blood culture 2/2 group B strep pansensitive placed on Ancef therapy evaluations completed and patient was admitted for a comprehensive rehab program  Patient transferred to CIR on 10/14/2020 .    Patient currently requires max with basic self-care skills secondary to muscle weakness, decreased cardiorespiratoy endurance, unbalanced muscle activation, decreased coordination and decreased motor planning, decreased attention to left and decreased motor planning, decreased initiation, decreased attention, decreased awareness, decreased problem solving, decreased safety awareness, decreased memory and delayed processing and decreased sitting balance, decreased standing balance, decreased postural control, hemiplegia, and decreased balance strategies.  Prior to hospitalization, patient could complete ADLs with mod.  Patient will benefit from skilled intervention to decrease level of assist with basic self-care skills prior to discharge home with care partner.  Anticipate patient will require 24 hour supervision and moderate physical assestance and follow up home health.  OT - End of Session Activity Tolerance:  Tolerates 10 - 20 min activity with multiple rests Endurance Deficit: Yes Endurance Deficit Description: generalized weakness/debility OT Assessment Rehab Potential (ACUTE ONLY): Good OT Patient demonstrates impairments in the following area(s): Balance;Cognition;Safety;Endurance;Motor;Vision;Pain;Perception;Edema;Sensory;Skin Integrity OT Basic ADL's Functional Problem(s): Grooming;Bathing;Dressing;Toileting;Eating OT Transfers Functional Problem(s): Toilet;Tub/Shower OT Additional Impairment(s): Fuctional Use of Upper Extremity OT Plan OT Intensity: Minimum of 1-2 x/day, 45 to 90 minutes OT Frequency: 5 out of 7 days OT Duration/Estimated Length of Stay: 3-4 weeks OT Treatment/Interventions: Balance/vestibular training;Community reintegration;Cognitive remediation/compensation;Discharge planning;Patient/family education;Self Care/advanced ADL retraining;Therapeutic Exercise;DME/adaptive equipment instruction;Functional mobility training;Pain management;Psychosocial support;Skin care/wound managment;Therapeutic Activities;UE/LE Strength taining/ROM;Visual/perceptual remediation/compensation;UE/LE Coordination activities;Wheelchair propulsion/positioning;Splinting/orthotics;Neuromuscular re-education;Functional electrical stimulation;Disease mangement/prevention OT Self Feeding Anticipated Outcome(s): set up assist OT Basic Self-Care Anticipated Outcome(s): min A OT Toileting Anticipated Outcome(s): mod A OT Bathroom Transfers Anticipated Outcome(s): mod A OT Recommendation Patient destination: Home Follow Up Recommendations: Home health OT Equipment Recommended: To be determined   OT Evaluation Precautions/Restrictions  Precautions Precautions: Fall Restrictions Weight Bearing Restrictions: No General Chart Reviewed: Yes Family/Caregiver Present: No  Pain Pain Assessment Pain Scale: 0-10 Pain Score: 0-No pain Faces Pain Scale: No hurt Home Living/Prior Functioning Home  Living Family/patient expects to be discharged to:: Private residence Living Arrangements: Children Available Help at Discharge: Family, Available 24 hours/day Type of Home: House Home Access: Stairs to enter [employer] of Steps: 5 Entrance Stairs-Rails: Right, Left Home Layout: One level Bathroom Shower/Tub: Chiropodist: Handicapped height Bathroom Accessibility: Yes Additional Comments: Obtained answers from the son via phone  Lives With: Son Prior Function Level of Independence: Needs assistance with ADLs, Needs assistance with tranfers, Requires assistive device for independence Vocation: Retired Comments: Pt's son reports he was assisting  with ADLs and transfers, pt had good and bad days Vision Baseline Vision/History: No visual deficits Patient Visual Report: No change from baseline Vision Assessment?: Vision impaired- to be further tested in functional context (Pt unable to participate in formal vision assessment) Perception  Perception: Impaired (Continue to assess in functional setting) Praxis Praxis: Impaired Praxis Impairment Details: Initiation;Motor planning Cognition Overall Cognitive Status: No family/caregiver present to determine baseline cognitive functioning Arousal/Alertness: Awake/alert Orientation Level: Person;Place;Situation Person: Oriented Place: Oriented Situation: Oriented Year: Other (Comment) (2003) Month: October Day of Week: Incorrect Memory: Impaired Memory Impairment: Decreased recall of new information Immediate Memory Recall: Sock;Bed;Blue Memory Recall Sock: Not able to recall Memory Recall Blue: Not able to recall Memory Recall Bed: Not able to recall Attention: Selective Selective Attention: Impaired Selective Attention Impairment: Verbal basic;Functional basic Awareness: Impaired Awareness Impairment: Intellectual impairment Problem Solving: Impaired Problem Solving Impairment: Functional  basic;Verbal basic Executive Function:  (all impaired) Safety/Judgment: Impaired Sensation Sensation Light Touch:  (unable to determine d/t cognitive and expression language deficits) Coordination Gross Motor Movements are Fluid and Coordinated: No Fine Motor Movements are Fluid and Coordinated: No Coordination and Movement Description: gross debility Motor  Motor Motor: Hemiplegia Motor - Skilled Clinical Observations: L hemi, generalized weakness  Trunk/Postural Assessment  Cervical Assessment Cervical Assessment: Exceptions to [facility] (forward head) Thoracic Assessment Thoracic Assessment: Exceptions to [facility] (kyphotic posture, likely baseline) Lumbar Assessment Lumbar Assessment: Exceptions to [facility] (posterior pelvic tilt) Postural Control Postural Control: Deficits on evaluation Righting Reactions: delayed and inadequate Protective Responses: delayed and inadequate  Balance Balance Balance Assessed: Yes Static Sitting Balance Static Sitting - Balance Support: Feet supported;Bilateral upper extremity supported Static Sitting - Level of Assistance: 4: Min assist Dynamic Sitting Balance Dynamic Sitting - Balance Support: During functional activity;Feet supported;Bilateral upper extremity supported Dynamic Sitting - Level of Assistance: 3: Mod assist Static Standing Balance Static Standing - Balance Support: During functional activity;Bilateral upper extremity supported Static Standing - Level of Assistance: 1: +2 Total assist Dynamic Standing Balance Dynamic Standing - Balance Support: During functional activity;Bilateral upper extremity supported Dynamic Standing - Level of Assistance: 1: +2 Total assist Extremity/Trunk Assessment RUE Assessment RUE Assessment: Exceptions to [facility] General Strength Comments: generalized weakness, 3+/5, limited shoulder AROM LUE Assessment LUE Assessment: Exceptions to [facility] General Strength Comments: Pt has isolated movement in his LUE, it remains  weak, 3/5 MMT LUE Body System: Neuro Brunstrum levels for arm and hand: Arm;Hand Brunstrum level for arm: Stage IV Movement is deviating from synergy Brunstrum level for hand: Stage V Independence from basic synergies  Care Tool Care Tool Self Care Eating   Eating Assist Level: Maximal Assistance - Patient 25 - 49%    Oral Care  Oral care, brush teeth, clean dentures activity did not occur: Refused      Bathing Bathing activity did not occur: Refused            Upper Body Dressing(including orthotics)   What is the patient wearing?: Pull over shirt   Assist Level: Maximal Assistance - Patient 25 - 49%    Lower Body Dressing (excluding footwear)   What is the patient wearing?: Underwear/pull up Assist for lower body dressing: 2 Helpers    Putting on/Taking off footwear   What is the patient wearing?: Non-skid slipper socks Assist for footwear: Dependent - Patient 0%       Care Tool Toileting Toileting activity   Assist for toileting: 2 Helpers     Care Tool Bed Mobility Roll left and right activity  Roll left and right assist level: Maximal Assistance - Patient 25 - 49%    Sit to lying activity   Sit to lying assist level: Maximal Assistance - Patient 25 - 49%    Lying to sitting edge of bed activity   Lying to sitting edge of bed assist level: Maximal Assistance - Patient 25 - 49%     Care Tool Transfers Sit to stand transfer   Sit to stand assist level: 2 Helpers    Chair/bed transfer   Chair/bed transfer assist level: 2 Pension scheme manager transfer   Assist Level: 2 Helpers     Care Tool Cognition Expression of Ideas and Wants Expression of Ideas and Wants: Frequent difficulty - frequently exhibits difficulty with expressing needs and ideas   Understanding Verbal and Non-Verbal Content Understanding Verbal and Non-Verbal Content: Sometimes understands - understands only basic conversations or simple, direct phrases. Frequently requires cues to  understand   Memory/Recall Ability *first 3 days only Memory/Recall Ability *first 3 days only: That he or she is in a hospital/hospital unit;Current season    Refer to Care Plan for Long Term Goals  SHORT TERM GOAL WEEK 1 OT Short Term Goal 1 (Week 1): Pt will don shirt with mod A OT Short Term Goal 2 (Week 1): Pt will maintain sitting balance EOB with CGA to complete 1 ADL OT Short Term Goal 3 (Week 1): Pt will complete self feeding with LRAD with min A  Recommendations for other services: None    Skilled OT Intervention: Education provided to pt and then his son via telephone re OT POC, ELOS, and CLOF. Discussed d/c planning and rehab expectations. Pt declined bathing but completed dressing as described below. Attempted 3x sit <>stands from EOB with max-total A but was unable to achieve fully upright posture. Also attempted stedy transfer but again, was unable to fully tuck hips. Slideboard transfer to w/c with mod +2 assist. Extensive time spent with pt sitting EOB working on midline orientation and sitting balance. Pt left in w/c with safety belt on, all needs met.   Skilled Therapeutic Intervention ADL ADL Eating: Maximal assistance Where Assessed-Eating: Bed level Grooming: Maximal assistance Where Assessed-Grooming: Bed level Upper Body Bathing: Not assessed Lower Body Bathing: Not assessed Upper Body Dressing: Maximal assistance Where Assessed-Upper Body Dressing: Edge of bed Lower Body Dressing: Dependent Where Assessed-Lower Body Dressing: Edge of bed Toileting: Unable to assess Toilet Transfer: Unable to assess Toilet Transfer Method: Unable to assess Tub/Shower Transfer: Unable to assess Mobility  Bed Mobility Bed Mobility: Sit to Supine;Supine to Sit Supine to Sit: Maximal Assistance - Patient - Patient 25-49% Sit to Supine: Maximal Assistance - Patient 25-49% Transfers Sit to Stand: 2 Helpers Stand to Sit: 2 Helpers   Discharge Criteria: Patient will be  discharged from OT if patient refuses treatment 3 consecutive times without medical reason, if treatment goals not met, if there is a change in medical status, if patient makes no progress towards goals or if patient is discharged from hospital.  The above assessment, treatment plan, treatment alternatives and goals were discussed and mutually agreed upon: by patient and by family  Curtis Sites 10/15/2020, 1:02 PM

## 2020-10-15 NOTE — Progress Notes (Signed)
Inpatient Rehabilitation Medication Review by a Pharmacist  A complete drug regimen review was completed for this patient to identify any potential clinically significant medication issues.  Clinically significant medication issues were identified:  yes   Type of Medication Issue Identified Description of Issue Plan Plan Accepted by Provider? (Yes / No / Pending AM Rounds)  Drug Interaction(s) (clinically significant)      Duplicate Therapy      Allergy      No Medication Administration End Date  Patient to receive total of 10 day course of antibiotics for pan-sensitive group B strep bacteremia - when re-ordered for CIR end date did not correctly cross over Correct end date has been entered N/A  Incorrect Dose      Additional Drug Therapy Needed  Currently holding Xarelto for GIB during inpatient admission. Not currently on VTE prophylaxis.  Secure chat sent to Dr. Letta Pate to ask if appropriate to resume VTE prophylaxis Yes - Lovenox has now been ordered per Dr. Letta Pate  Other        Name of provider notified for issues identified: Dr. Letta Pate  Provider Method of Notification: Secure chat  Pharmacist comments: All identified issues are resolved.   Time spent performing this drug regimen review (minutes):  [place], PharmD PGY1 Pharmacy Resident 10/15/2020 7:56 AM  Please check [email or web address] for unit-specific pharmacy phone numbers.

## 2020-10-15 NOTE — Plan of Care (Signed)
  Problem: RH BOWEL ELIMINATION Goal: RH STG MANAGE BOWEL WITH ASSISTANCE Description: STG Manage Bowel with Assistance. Mod Outcome: Progressing   Problem: RH SKIN INTEGRITY Goal: RH STG SKIN FREE OF INFECTION/BREAKDOWN Description: Skin free of breakdown and infection with mod assist Outcome: Progressing

## 2020-10-16 ENCOUNTER — Inpatient Hospital Stay (HOSPITAL_COMMUNITY): Payer: PPO | Admitting: Speech Pathology

## 2020-10-16 ENCOUNTER — Inpatient Hospital Stay (HOSPITAL_COMMUNITY): Payer: PPO

## 2020-10-16 ENCOUNTER — Inpatient Hospital Stay (HOSPITAL_COMMUNITY): Payer: PPO | Admitting: Occupational Therapy

## 2020-10-16 LAB — CBC WITH DIFFERENTIAL/PLATELET
Abs Immature Granulocytes: 0.07 10*3/uL (ref 0.00–0.07)
Basophils Absolute: 0 10*3/uL (ref 0.0–0.1)
Basophils Relative: 0 %
Eosinophils Absolute: 0.3 10*3/uL (ref 0.0–0.5)
Eosinophils Relative: 3 %
HCT: 28.2 % — ABNORMAL LOW (ref 39.0–52.0)
Hemoglobin: 8.8 g/dL — ABNORMAL LOW (ref 13.0–17.0)
Immature Granulocytes: 1 %
Lymphocytes Relative: 5 %
Lymphs Abs: 0.6 10*3/uL — ABNORMAL LOW (ref 0.7–4.0)
MCH: 24.9 pg — ABNORMAL LOW (ref 26.0–34.0)
MCHC: 31.2 g/dL (ref 30.0–36.0)
MCV: 79.7 fL — ABNORMAL LOW (ref 80.0–100.0)
Monocytes Absolute: 0.9 10*3/uL (ref 0.1–1.0)
Monocytes Relative: 7 %
Neutro Abs: 10.7 10*3/uL — ABNORMAL HIGH (ref 1.7–7.7)
Neutrophils Relative %: 84 %
Platelets: 292 10*3/uL (ref 150–400)
RBC: 3.54 MIL/uL — ABNORMAL LOW (ref 4.22–5.81)
RDW: 22.1 % — ABNORMAL HIGH (ref 11.5–15.5)
WBC: 12.7 10*3/uL — ABNORMAL HIGH (ref 4.0–10.5)
nRBC: 0 % (ref 0.0–0.2)

## 2020-10-16 LAB — COMPREHENSIVE METABOLIC PANEL
ALT: 5 U/L (ref 0–44)
AST: 18 U/L (ref 15–41)
Albumin: 2.1 g/dL — ABNORMAL LOW (ref 3.5–5.0)
Alkaline Phosphatase: 65 U/L (ref 38–126)
Anion gap: 11 (ref 5–15)
BUN: 26 mg/dL — ABNORMAL HIGH (ref 8–23)
CO2: 20 mmol/L — ABNORMAL LOW (ref 22–32)
Calcium: 7.9 mg/dL — ABNORMAL LOW (ref 8.9–10.3)
Chloride: 109 mmol/L (ref 98–111)
Creatinine, Ser: 1.01 mg/dL (ref 0.61–1.24)
GFR, Estimated: >60 mL/min (ref >60)
Glucose, Bld: 98 mg/dL (ref 70–99)
Potassium: 3.6 mmol/L (ref 3.5–5.1)
Sodium: 140 mmol/L (ref 135–145)
Total Bilirubin: 1.2 mg/dL (ref 0.3–1.2)
Total Protein: 5.4 g/dL — ABNORMAL LOW (ref 6.5–8.1)

## 2020-10-16 LAB — GLUCOSE, CAPILLARY
Glucose-Capillary: 97 mg/dL (ref 70–99)
Glucose-Capillary: 89 mg/dL (ref 70–99)
Glucose-Capillary: 88 mg/dL (ref 70–99)
Glucose-Capillary: 94 mg/dL (ref 70–99)

## 2020-10-16 MED ORDER — FUROSEMIDE 20 MG PO TABS
20.0000 mg | ORAL_TABLET | Freq: Once | ORAL | Status: AC
  Administered 2020-10-16: 20 mg via ORAL
  Filled 2020-10-16: qty 1

## 2020-10-16 NOTE — Progress Notes (Signed)
Fernando Ribas, MD  Physician  Physical Medicine and Rehabilitation  Consult Note      Signed  Date of Service:  10/09/2020  5:11 AM      Related encounter: ED to Hosp-Admission (Discharged) from 10/06/2020 in [facility]      Signed      Expand All Collapse All  Show:Clear all [x] Manual[x] Template[] Copied  Added by: [x] Angiulli, Lavon Paganini, PA-C[x] Raulkar, Clide Deutscher, MD  [] Hover for details          Physical Medicine and Rehabilitation Consult Reason for Consult: Left side weakness with severe dysarthria Referring Physician: Dr.Xu   HPI: Fernando [name] is a 86 y.o. right-handed male with history of atrial fibrillation maintained on Xarelto, hypertension, CAD/pacemaker, hyperlipidemia, prior left CEA, SCC of the carotid gland and recent TIA August 2021.  He quit smoking 47 years ago.  Per chart review patient lives with his children.  He was receiving assist for ADLs and home care.  1 level home 6 steps to entry.  Presented 10/07/2020 to [facility] left side weakness as well as dysarthria.  Cranial CT scan showed hyperdense right MCA suspicious for emergent large vessel occlusion.  No acute cortically based infarct or acute intracranial hemorrhage.  Patient did not receive TPA.  Echocardiogram with ejection fraction of 40 to 45%.  Without thrombus.  CTA of head and neck no emergent large vessel occlusion.  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  Delayed transit throughout the entire right cerebral hemisphere and left ACA territory related to high-grade proximal right ICA stenosis and chronically diminutive disease left ACA.  Patient underwent ICA angioplasty per interventional radiology.  He did require intubation for a short time.  Hospital course fever blood cultures 2/2 G+ cocci and currently maintained on Rocephin.  He was cleared to begin aspirin for CVA prophylaxis for right MCA infarct due to right ICA high-grade stenosis.  Lovenox was added for  DVT prophylaxis.  Patient is currently NPO.  Therapy evaluations completed with recommendations of physical medicine rehab consult.   Review of Systems  Constitutional: Positive for fever. Negative for chills.  HENT: Negative for hearing loss.   Eyes: Negative for blurred vision and double vision.  Respiratory: Negative for cough and shortness of breath.   Cardiovascular: Positive for palpitations and leg swelling.  Gastrointestinal: Positive for constipation. Negative for heartburn, nausea and vomiting.  Genitourinary: Negative for dysuria, flank pain and hematuria.  Musculoskeletal: Positive for joint pain and myalgias.  Skin: Negative for rash.  Neurological: Positive for speech change and weakness.  All other systems reviewed and are negative.       Past Medical History:  Diagnosis Date  . Arthritis    . Atrial fibrillation ([place])    . Carotid artery disease ([place])      Dr. Kellie Simmering - s/p left CEA  . Coronary artery disease    . Essential hypertension, benign    . Hyperlipidemia    . 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 This mass was initially felt to be 1.7 cm at the time of presentation but had enlarged significantly to 4 cm clinically at the time of surgery. Pathologically it was 3.4 cm. A lymph node deep to the parotid was enlarged clinically at the time of surgery but was negative   . Tachycardia-bradycardia syndrome ([place])  Past Surgical History:  Procedure Laterality Date  . CAROTID ENDARTERECTOMY Left Jan. 3, 2009    CE  . CATARACT EXTRACTION W/ INTRAOCULAR LENS IMPLANT        Bilateral  . ESOPHAGOGASTRODUODENOSCOPY N/A 03/29/2018    Procedure: ESOPHAGOGASTRODUODENOSCOPY (EGD);  Surgeon: Danie Binder, MD;  Location[facility];  Service: Endoscopy;  Laterality: N/A;  . EYE SURGERY      . INSERT / REPLACE / REMOVE  PACEMAKER      . PACEMAKER INSERTION   10-01-2012  . PARATHYROIDECTOMY   10/28/2012  . PAROTIDECTOMY   10/28/2012    Procedure: PAROTIDECTOMY;  Surgeon: Ascencion Dike, MD;  Location: [place];  Service: ENT;  Laterality: Right;  Total Parotidectomy  . PERMANENT PACEMAKER INSERTION N/A 10/01/2012    Procedure: PERMANENT PACEMAKER INSERTION;  Surgeon: Evans Lance, MD;  Location: [facility] CATH LAB;  Service: Cardiovascular;  Laterality: N/A;         Family History  Problem Relation Age of Onset  . Heart disease Sister    . Heart attack Sister    . Cirrhosis Mother    . Cancer Brother          brain cancer  . Cancer Sister          stomach cancer    Social History:  reports that he quit smoking about 47 years ago. His smoking use included cigarettes. He has never used smokeless tobacco. He reports current alcohol use of about 1.0 standard drink of alcohol per week. He reports that he does not use drugs. Allergies: No Known Allergies       Medications Prior to Admission  Medication Sig Dispense Refill  . atorvastatin (LIPITOR) 40 MG tablet Take 40 mg by mouth every morning.       Marland Kitchen levothyroxine (SYNTHROID) 50 MCG tablet Take 1 tablet (50 mcg total) by mouth daily before breakfast. 30 tablet 3  . metoprolol tartrate (LOPRESSOR) 25 MG tablet TAKE 1 TABLET TWICE A DAY (Patient taking differently: Take 25 mg by mouth 2 (two) times daily. ) 180 tablet 0  . ondansetron (ZOFRAN ODT) 4 MG disintegrating tablet 4mg  ODT q4 hours prn nausea/vomit 12 tablet 0  . pantoprazole (PROTONIX) 40 MG tablet Take 1 tablet (40 mg total) by mouth 2 (two) times daily before a meal. Twice a day for 3 months and then start taking daily. 60 tablet 3  . XARELTO 15 MG TABS tablet TAKE 1 TABLET BY MOUTH ONCE DAILY WITH SUPPER (Patient taking differently: Take 15 mg by mouth daily with supper. ) 90 tablet 3      Home: Home Living Family/patient expects to be discharged to:: Private residence Living Arrangements:  Children Available Help at Discharge: Family Type of Home: House Home Access: Stairs to enter Technical brewer of Steps: 5 Entrance Stairs-Rails: Right, Left Home Layout: One level Bathroom Shower/Tub: Chiropodist: Handicapped height Bathroom Accessibility: Yes Home Equipment: Environmental consultant - 2 wheels, Tolu - single point, Bedside commode, Shower seat - built in Additional Comments: information from previous charting, pt unable to state clear answers to history questions or consistently answer yes/no. No family present at eval  Functional History: Prior Function Level of Independence: Needs assistance Comments: Per neurology note, pt received assist for ADLs and home care such as cooking, but pt did not need assist for mobility. Pt gave inconsistent answers during history in session Functional Status:  Mobility: Bed Mobility Overal bed mobility: Needs Assistance Bed Mobility: Supine  to Sit Supine to sit: Mod assist, +2 for physical assistance General bed mobility comments: modA to BLE and to raise trunk from [facility]. Use of bed pads to control hips from sliding off of EOB Transfers Overall transfer level: Needs assistance Equipment used: 2 person hand held assist Transfers: Sit to/from Stand, Stand Pivot Transfers Sit to Stand: Max assist, +2 physical assistance, +2 safety/equipment Stand pivot transfers: Max assist, +2 physical assistance, +2 safety/equipment General transfer comment: maxA to stand with HHA of 2, pt with strong L lean continued in standing. MaxA to initiate steps to pivot to recliner. Required facilitation at hips to initiate safe descent for sitting Ambulation/Gait Ambulation/Gait assistance: Max assist, +2 physical assistance Gait Distance (Feet): 3 Feet Assistive device: 2 person hand held assist Gait Pattern/deviations: Step-to pattern, Decreased stride length, Narrow base of support General Gait Details: pt heavily reliant on support or  therapists, narrow BOS with poor ability to maintain balance or clear feet for steps. assist to wt shift for stepping   ADL: ADL Overall ADL's : Needs assistance/impaired Eating/Feeding: NPO Grooming: Minimal assistance, Moderate assistance, Sitting Upper Body Bathing: Moderate assistance, Sitting Lower Body Bathing: Maximal assistance, Sitting/lateral leans, Sit to/from stand Upper Body Dressing : Moderate assistance, Sitting Lower Body Dressing: Maximal assistance, Sitting/lateral leans, Sit to/from stand Toilet Transfer: Maximal assistance, +2 for physical assistance, +2 for safety/equipment, Stand-pivot, BSC Toilet Transfer Details (indicate cue type and reason): assist to rise and steady with increased L sided assist due to weakness Toileting- Clothing Manipulation and Hygiene: Total assistance Functional mobility during ADLs: Maximal assistance, +2 for physical assistance, +2 for safety/equipment, Cueing for safety, Cueing for sequencing (stand pivot)   Cognition: Cognition Overall Cognitive Status: Difficult to assess Orientation Level: Oriented to person, Disoriented to place, Disoriented to time, Disoriented to situation Cognition Arousal/Alertness: Awake/alert Behavior During Therapy: WFL for tasks assessed/performed Overall Cognitive Status: Difficult to assess Area of Impairment: Following commands, Orientation, Safety/judgement, Problem solving Orientation Level: Time (able to state he is in [place] and they think he might have had a stroke) Following Commands: Follows one step commands with increased time (repetition) Safety/Judgement: Decreased awareness of safety, Decreased awareness of deficits Problem Solving: Slow processing, Requires verbal cues, Requires tactile cues General Comments: difficult to fully assess due to pt being [facility] and with low tone garbled speech. Requires increased time and repetition to follow basic mobility commands Difficult to assess due to:  Impaired communication   Blood pressure (!) 136/58, pulse 87, temperature 98.5 F (36.9 C), temperature source Axillary, resp. rate (!) 36, height 5\' 5"  (1.651 m), weight 64.9 kg, SpO2 95 %. Physical Exam General: Alert, No apparent distress HEENT: left facial droop, thrush Neck: Supple without JVD or lymphadenopathy Heart: Irregularly irregular. No murmurs rubs or gallops Chest: Tachypneic Abdomen: Soft, non-tender, non-distended, bowel sounds positive. Extremities: No clubbing, cyanosis, or edema. Pulses are 2+ Skin: Clean and intact without signs of breakdown Neuro: Patient is alert.  Severely dysarthric.  Makes eye contact with examiner.  Very limited to follow commands.  +dysarthria RUE: 4/5 throughout LUE: 3/5 throughout RLE: 3/5 throughout LLE: 3/5 throughout Psych: Pt's affect is appropriate. Pt is cooperative   Lab Results Last 24 Hours  Results for orders placed or performed during the hospital encounter of 10/06/20 (from the past 24 hour(s))  Glucose, capillary     Status: None    Collection Time: 10/07/20 11:13 AM  Result Value Ref Range    Glucose-Capillary 83 70 - 99 mg/dL  Glucose, capillary  Status: None    Collection Time: 10/07/20  4:13 PM  Result Value Ref Range    Glucose-Capillary 76 70 - 99 mg/dL  Glucose, capillary     Status: None    Collection Time: 10/07/20  7:33 PM  Result Value Ref Range    Glucose-Capillary 82 70 - 99 mg/dL  Glucose, capillary     Status: None    Collection Time: 10/07/20 11:22 PM  Result Value Ref Range    Glucose-Capillary 94 70 - 99 mg/dL  CBC     Status: Abnormal    Collection Time: 10/08/20  2:25 AM  Result Value Ref Range    WBC 17.7 (H) 4.0 - 10.5 K/uL    RBC 3.62 (L) 4.22 - 5.81 MIL/uL    Hemoglobin 8.2 (L) 13.0 - 17.0 g/dL    HCT 27.5 (L) 39 - 52 %    MCV 76.0 (L) 80.0 - 100.0 fL    MCH 22.7 (L) 26.0 - 34.0 pg    MCHC 29.8 (L) 30.0 - 36.0 g/dL    RDW 18.4 (H) 11.5 - 15.5 %    Platelets 228 150 - 400 K/uL     nRBC 0.3 (H) 0.0 - 0.2 %  Basic metabolic panel     Status: Abnormal    Collection Time: 10/08/20  2:25 AM  Result Value Ref Range    Sodium 146 (H) 135 - 145 mmol/L    Potassium 4.3 3.5 - 5.1 mmol/L    Chloride 118 (H) 98 - 111 mmol/L    CO2 16 (L) 22 - 32 mmol/L    Glucose, Bld 108 (H) 70 - 99 mg/dL    BUN 34 (H) 8 - 23 mg/dL    Creatinine, Ser 1.48 (H) 0.61 - 1.24 mg/dL    Calcium 7.9 (L) 8.9 - 10.3 mg/dL    GFR, Estimated 44 (L) >60 mL/min    Anion gap 12 5 - 15  Magnesium     Status: None    Collection Time: 10/08/20  2:25 AM  Result Value Ref Range    Magnesium 1.9 1.7 - 2.4 mg/dL  Glucose, capillary     Status: Abnormal    Collection Time: 10/08/20  3:15 AM  Result Value Ref Range    Glucose-Capillary 101 (H) 70 - 99 mg/dL  Glucose, capillary     Status: Abnormal    Collection Time: 10/08/20  7:19 AM  Result Value Ref Range    Glucose-Capillary 102 (H) 70 - 99 mg/dL       Imaging Results (Last 48 hours)  CT Code Stroke CTA Head W/WO contrast   Result Date: 10/06/2020 CLINICAL DATA:  Left-sided weakness. EXAM: CT ANGIOGRAPHY HEAD AND NECK CT PERFUSION BRAIN TECHNIQUE: Multidetector CT imaging of the head and neck was performed using the standard protocol during bolus administration of intravenous contrast. Multiplanar CT image reconstructions and MIPs were obtained to evaluate the vascular anatomy. Carotid stenosis measurements (when applicable) are obtained utilizing NASCET criteria, using the distal internal carotid diameter as the denominator. Multiphase CT imaging of the brain was performed following IV bolus contrast injection. Subsequent parametric perfusion maps were calculated using RAPID software. CONTRAST:  162mL OMNIPAQUE IOHEXOL 350 MG/ML SOLN COMPARISON:  Head and neck CTA 07/02/2020 FINDINGS: CTA NECK FINDINGS Aortic arch: Standard 3 vessel aortic arch with moderate calcified plaque. Similar size of a 3.4 cm outpouching from the transverse aorta in the region of  the ductus although its inferior aspect was incompletely covered on today's  study. This has a relatively narrow neck and may reflect a pseudoaneurysm. Unchanged kinked appearance of the proximal to mid left subclavian artery. Limited assessment of a proximal to mid right subclavian artery stenosis due to artifact from motion and calcified plaque. Right carotid system: Patent with predominantly calcified plaque throughout the mid common carotid artery without significant associated stenosis. Extensive calcified and soft plaque at the carotid bifurcation with persistent radiographic string sign of the ICA at its origin. The more distal cervical ICA is patent but mildly small in caliber diffusely consistent with reduced flow. Left carotid system: Patent with suspected moderate stenosis of the common carotid artery origin though with motion artifact limiting precise characterization. Mild calcified plaque in the distal common and proximal internal carotid arteries without significant stenosis. Vertebral arteries: Patent with the right being strongly dominant and the left being diffusely small in caliber. No evidence of dissection or significant stenosis on the right. Limited assessment of the proximal left V1 segment due to motion artifact. Skeleton: Asymmetrically advanced left-sided cervical facet arthrosis with mild multilevel degenerative anterolisthesis. Other neck: Right parotidectomy. Absent or atrophic right submandibular gland. No evidence of cervical lymphadenopathy. Upper chest: Motion artifact in the lung apices. Partially visualized patchy ground-glass opacities bilaterally with a moderate-sized pleural effusion on the right. Review of the MIP images confirms the above findings CTA HEAD FINDINGS Anterior circulation: The internal carotid arteries are patent from skull base to carotid termini with relatively diffuse atherosclerotic plaque resulting in at least mild multifocal stenoses bilaterally with  assessment limited by motion artifact today. The MCAs are patent without evidence of a proximal branch occlusion, however there is mild-to-moderate diffuse attenuation of right MCA branch vessels compared to the left which may reflect reduced flow from the high-grade proximal ICA stenosis. The left A1 segment is again noted to be absent or severely hypoplastic. The right A1 segment is widely patent. The right A2 segment appears dominant with chronic poor visualization of the left ACA. No aneurysm is identified. Posterior circulation: The intracranial vertebral arteries are patent to the basilar. Patent left PICA, right AICA, and bilateral SCA origins are identified. The basilar artery is patent with an unchanged mild stenosis proximally. There is a fetal origin of the right PCA. Both PCAs are patent and diffusely irregular, and there is a severe proximal to mid P2 stenosis on the right. No aneurysm is identified. Venous sinuses: Patent. Anatomic variants: Fetal right PCA.  Diminutive or absent left A1. Review of the MIP images confirms the above findings CT Brain Perfusion Findings: ASPECTS: 10 CBF (<30%) Volume: 0 mL Perfusion (Tmax>6.0s) volume: 312 mL Mismatch Volume: 312 mL Infarction Location: No core infarct identified. Prolonged Tmax throughout essentially the entirety of the right cerebral hemisphere as well as portions of the left ACA territory. IMPRESSION: 1. No emergent large vessel occlusion. 2. Persistent radiographic string sign of the proximal right ICA. 3. Delayed transit throughout the entire right cerebral hemisphere and left ACA territory related to the high-grade proximal right ICA stenosis and chronically diminutive/diseased left ACA. No core infarct identified by CTP. 4. Severe right P2 stenosis. 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 6. Bilateral ground-glass lung opacities which could reflect edema or infection. Moderate right pleural effusion. 7.  Aortic Atherosclerosis (ICD10-I70.0).  These results were called by telephone at the time of interpretation on 10/06/2020 at 6:15 pm to Dr. Roland Rack , who verbally acknowledged these results. Electronically Signed   By: Logan Bores M.D.   On: 10/06/2020 18:54  CT HEAD WO CONTRAST   Result Date: 10/07/2020 CLINICAL DATA:  Stenotic disease of the carotid arteries right worse than left. Assess for potential infarction on the right. EXAM: CT HEAD WITHOUT CONTRAST TECHNIQUE: Contiguous axial images were obtained from the base of the skull through the vertex without intravenous contrast. COMPARISON:  10/06/2020 FINDINGS: Brain: Chronic appearing small vessel ischemic changes of the pons and midbrain. Old cerebellar infarctions on the left. Cerebral hemispheres show an old lacunar infarction in the left thalamus and right caudate head and chronic small-vessel ischemic changes of the cerebral hemispheric white matter. No sign of acute infarction, mass lesion, hemorrhage, hydrocephalus or extra-axial collection. Vascular: There is atherosclerotic calcification of the major vessels at the base of the brain. Skull: Negative Sinuses/Orbits: Mucosal inflammatory changes of the paranasal sinuses. Orbits negative. Other: None IMPRESSION: No acute finding by CT. Chronic small-vessel ischemic changes throughout the brain as outlined above. Electronically Signed   By: Nelson Chimes M.D.   On: 10/07/2020 06:15    CT Code Stroke CTA Neck W/WO contrast   Result Date: 10/06/2020 CLINICAL DATA:  Left-sided weakness. EXAM: CT ANGIOGRAPHY HEAD AND NECK CT PERFUSION BRAIN TECHNIQUE: Multidetector CT imaging of the head and neck was performed using the standard protocol during bolus administration of intravenous contrast. Multiplanar CT image reconstructions and MIPs were obtained to evaluate the vascular anatomy. Carotid stenosis measurements (when applicable) are obtained utilizing NASCET criteria, using the distal internal carotid diameter as the  denominator. Multiphase CT imaging of the brain was performed following IV bolus contrast injection. Subsequent parametric perfusion maps were calculated using RAPID software. CONTRAST:  115mL OMNIPAQUE IOHEXOL 350 MG/ML SOLN COMPARISON:  Head and neck CTA 07/02/2020 FINDINGS: CTA NECK FINDINGS Aortic arch: Standard 3 vessel aortic arch with moderate calcified plaque. Similar size of a 3.4 cm outpouching from the transverse aorta in the region of the ductus although its inferior aspect was incompletely covered on today's study. This has a relatively narrow neck and may reflect a pseudoaneurysm. Unchanged kinked appearance of the proximal to mid left subclavian artery. Limited assessment of a proximal to mid right subclavian artery stenosis due to artifact from motion and calcified plaque. Right carotid system: Patent with predominantly calcified plaque throughout the mid common carotid artery without significant associated stenosis. Extensive calcified and soft plaque at the carotid bifurcation with persistent radiographic string sign of the ICA at its origin. The more distal cervical ICA is patent but mildly small in caliber diffusely consistent with reduced flow. Left carotid system: Patent with suspected moderate stenosis of the common carotid artery origin though with motion artifact limiting precise characterization. Mild calcified plaque in the distal common and proximal internal carotid arteries without significant stenosis. Vertebral arteries: Patent with the right being strongly dominant and the left being diffusely small in caliber. No evidence of dissection or significant stenosis on the right. Limited assessment of the proximal left V1 segment due to motion artifact. Skeleton: Asymmetrically advanced left-sided cervical facet arthrosis with mild multilevel degenerative anterolisthesis. Other neck: Right parotidectomy. Absent or atrophic right submandibular gland. No evidence of cervical lymphadenopathy.  Upper chest: Motion artifact in the lung apices. Partially visualized patchy ground-glass opacities bilaterally with a moderate-sized pleural effusion on the right. Review of the MIP images confirms the above findings CTA HEAD FINDINGS Anterior circulation: The internal carotid arteries are patent from skull base to carotid termini with relatively diffuse atherosclerotic plaque resulting in at least mild multifocal stenoses bilaterally with assessment limited by motion artifact today.  The MCAs are patent without evidence of a proximal branch occlusion, however there is mild-to-moderate diffuse attenuation of right MCA branch vessels compared to the left which may reflect reduced flow from the high-grade proximal ICA stenosis. The left A1 segment is again noted to be absent or severely hypoplastic. The right A1 segment is widely patent. The right A2 segment appears dominant with chronic poor visualization of the left ACA. No aneurysm is identified. Posterior circulation: The intracranial vertebral arteries are patent to the basilar. Patent left PICA, right AICA, and bilateral SCA origins are identified. The basilar artery is patent with an unchanged mild stenosis proximally. There is a fetal origin of the right PCA. Both PCAs are patent and diffusely irregular, and there is a severe proximal to mid P2 stenosis on the right. No aneurysm is identified. Venous sinuses: Patent. Anatomic variants: Fetal right PCA.  Diminutive or absent left A1. Review of the MIP images confirms the above findings CT Brain Perfusion Findings: ASPECTS: 10 CBF (<30%) Volume: 0 mL Perfusion (Tmax>6.0s) volume: 312 mL Mismatch Volume: 312 mL Infarction Location: No core infarct identified. Prolonged Tmax throughout essentially the entirety of the right cerebral hemisphere as well as portions of the left ACA territory. IMPRESSION: 1. No emergent large vessel occlusion. 2. Persistent radiographic string sign of the proximal right ICA. 3. Delayed  transit throughout the entire right cerebral hemisphere and left ACA territory related to the high-grade proximal right ICA stenosis and chronically diminutive/diseased left ACA. No core infarct identified by CTP. 4. Severe right P2 stenosis. 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 6. Bilateral ground-glass lung opacities which could reflect edema or infection. Moderate right pleural effusion. 7.  Aortic Atherosclerosis (ICD10-I70.0). These results were called by telephone at the time of interpretation on 10/06/2020 at 6:15 pm to Dr. Roland Rack , who verbally acknowledged these results. Electronically Signed   By: Logan Bores M.D.   On: 10/06/2020 18:54    CT Code Stroke Cerebral Perfusion with contrast   Result Date: 10/06/2020 CLINICAL DATA:  Left-sided weakness. EXAM: CT ANGIOGRAPHY HEAD AND NECK CT PERFUSION BRAIN TECHNIQUE: Multidetector CT imaging of the head and neck was performed using the standard protocol during bolus administration of intravenous contrast. Multiplanar CT image reconstructions and MIPs were obtained to evaluate the vascular anatomy. Carotid stenosis measurements (when applicable) are obtained utilizing NASCET criteria, using the distal internal carotid diameter as the denominator. Multiphase CT imaging of the brain was performed following IV bolus contrast injection. Subsequent parametric perfusion maps were calculated using RAPID software. CONTRAST:  112mL OMNIPAQUE IOHEXOL 350 MG/ML SOLN COMPARISON:  Head and neck CTA 07/02/2020 FINDINGS: CTA NECK FINDINGS Aortic arch: Standard 3 vessel aortic arch with moderate calcified plaque. Similar size of a 3.4 cm outpouching from the transverse aorta in the region of the ductus although its inferior aspect was incompletely covered on today's study. This has a relatively narrow neck and may reflect a pseudoaneurysm. Unchanged kinked appearance of the proximal to mid left subclavian artery. Limited assessment of a proximal to mid  right subclavian artery stenosis due to artifact from motion and calcified plaque. Right carotid system: Patent with predominantly calcified plaque throughout the mid common carotid artery without significant associated stenosis. Extensive calcified and soft plaque at the carotid bifurcation with persistent radiographic string sign of the ICA at its origin. The more distal cervical ICA is patent but mildly small in caliber diffusely consistent with reduced flow. Left carotid system: Patent with suspected moderate stenosis of the common  carotid artery origin though with motion artifact limiting precise characterization. Mild calcified plaque in the distal common and proximal internal carotid arteries without significant stenosis. Vertebral arteries: Patent with the right being strongly dominant and the left being diffusely small in caliber. No evidence of dissection or significant stenosis on the right. Limited assessment of the proximal left V1 segment due to motion artifact. Skeleton: Asymmetrically advanced left-sided cervical facet arthrosis with mild multilevel degenerative anterolisthesis. Other neck: Right parotidectomy. Absent or atrophic right submandibular gland. No evidence of cervical lymphadenopathy. Upper chest: Motion artifact in the lung apices. Partially visualized patchy ground-glass opacities bilaterally with a moderate-sized pleural effusion on the right. Review of the MIP images confirms the above findings CTA HEAD FINDINGS Anterior circulation: The internal carotid arteries are patent from skull base to carotid termini with relatively diffuse atherosclerotic plaque resulting in at least mild multifocal stenoses bilaterally with assessment limited by motion artifact today. The MCAs are patent without evidence of a proximal branch occlusion, however there is mild-to-moderate diffuse attenuation of right MCA branch vessels compared to the left which may reflect reduced flow from the high-grade  proximal ICA stenosis. The left A1 segment is again noted to be absent or severely hypoplastic. The right A1 segment is widely patent. The right A2 segment appears dominant with chronic poor visualization of the left ACA. No aneurysm is identified. Posterior circulation: The intracranial vertebral arteries are patent to the basilar. Patent left PICA, right AICA, and bilateral SCA origins are identified. The basilar artery is patent with an unchanged mild stenosis proximally. There is a fetal origin of the right PCA. Both PCAs are patent and diffusely irregular, and there is a severe proximal to mid P2 stenosis on the right. No aneurysm is identified. Venous sinuses: Patent. Anatomic variants: Fetal right PCA.  Diminutive or absent left A1. Review of the MIP images confirms the above findings CT Brain Perfusion Findings: ASPECTS: 10 CBF (<30%) Volume: 0 mL Perfusion (Tmax>6.0s) volume: 312 mL Mismatch Volume: 312 mL Infarction Location: No core infarct identified. Prolonged Tmax throughout essentially the entirety of the right cerebral hemisphere as well as portions of the left ACA territory. IMPRESSION: 1. No emergent large vessel occlusion. 2. Persistent radiographic string sign of the proximal right ICA. 3. Delayed transit throughout the entire right cerebral hemisphere and left ACA territory related to the high-grade proximal right ICA stenosis and chronically diminutive/diseased left ACA. No core infarct identified by CTP. 4. Severe right P2 stenosis. 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 6. Bilateral ground-glass lung opacities which could reflect edema or infection. Moderate right pleural effusion. 7.  Aortic Atherosclerosis (ICD10-I70.0). These results were called by telephone at the time of interpretation on 10/06/2020 at 6:15 pm to Dr. Roland Rack , who verbally acknowledged these results. Electronically Signed   By: Logan Bores M.D.   On: 10/06/2020 18:54    DG Chest Port 1 View   Result  Date: 10/06/2020 CLINICAL DATA:  Shortness of breath, back pain since this morning EXAM: PORTABLE CHEST 1 VIEW COMPARISON:  Portable exam 1207 hours compared to 10/02/2012 FINDINGS: LEFT subclavian sequential transvenous pacemaker leads project at RIGHT atrium and RIGHT ventricle. Enlargement of cardiac silhouette. Mediastinal contours and pulmonary vascularity normal. Atherosclerotic calcification aorta. Slight accentuation of interstitial markings in the mid to lower lungs increased from previous exam, question minimal pulmonary edema or developing chronic interstitial lung disease. No pleural effusion or pneumothorax. Bones demineralized with BILATERAL glenohumeral degenerative changes. IMPRESSION: Enlargement of cardiac silhouette with pulmonary  vascular congestion. Increased interstitial markings in the mid to lower lungs question minimal edema or developing chronic interstitial lung disease. Electronically Signed   By: Lavonia Dana M.D.   On: 10/06/2020 12:23    ECHOCARDIOGRAM COMPLETE   Result Date: 10/07/2020    ECHOCARDIOGRAM REPORT   Patient Name:   Fernando [name] Date of Exam: 10/07/2020 Medical Rec #:  [id number]      Height:       65.0 in Accession #:    [id number]     Weight:       143.0 lb Date of Birth:  [birth year]     BSA:          1.715 m Patient Age:    7 years       BP:           119/82 mmHg Patient Gender: M              HR:           111 bpm. Exam Location:  Inpatient Procedure: 2D Echo, Intracardiac Opacification Agent, Color Doppler and Cardiac            Doppler Indications:    Stroke  History:        Patient has prior history of Echocardiogram examinations, most                 recent 07/03/2020. CAD, Pacemaker, Arrythmias:Atrial Fibrillation;                 Risk Factors:Dyslipidemia, Hypertension and Former Smoker.                 Tachy-Brady syndrome.  Sonographer:    Clayton Lefort RDCS (AE) Referring Phys: [id number] [place]  1. Akinesis of the distal septum and apex;  overall mildly reduced LV function; no thrombus noted using definity.  2. Left ventricular ejection fraction, by estimation, is 40 to 45%. The left ventricle has mildly decreased function. The left ventricle demonstrates regional wall motion abnormalities (see scoring diagram/findings for description). Left ventricular diastolic function could not be evaluated.  3. Right ventricular systolic function is normal. The right ventricular size is normal.  4. Left atrial size was severely dilated.  5. Right atrial size was severely dilated.  6. The mitral valve is normal in structure. Mild mitral valve regurgitation. No evidence of mitral stenosis.  7. Tricuspid valve regurgitation is moderate.  8. The aortic valve is tricuspid. Aortic valve regurgitation is mild. Mild aortic valve stenosis.  9. The inferior vena cava is normal in size with greater than 50% respiratory variability, suggesting right atrial pressure of 3 mmHg. FINDINGS  Left Ventricle: Left ventricular ejection fraction, by estimation, is 40 to 45%. The left ventricle has mildly decreased function. The left ventricle demonstrates regional wall motion abnormalities. Definity contrast agent was given IV to delineate the left ventricular endocardial borders. The left ventricular internal cavity size was normal in size. There is no left ventricular hypertrophy. Left ventricular diastolic function could not be evaluated due to atrial fibrillation. Left ventricular diastolic function could not be evaluated. Right Ventricle: The right ventricular size is normal. Right ventricular systolic function is normal. Left Atrium: Left atrial size was severely dilated. Right Atrium: Right atrial size was severely dilated. Pericardium: Trivial pericardial effusion is present. Mitral Valve: The mitral valve is normal in structure. Mild mitral annular calcification. Mild mitral valve regurgitation. No evidence of mitral valve stenosis. Tricuspid Valve: The tricuspid valve is  normal in  structure. Tricuspid valve regurgitation is moderate . No evidence of tricuspid stenosis. Aortic Valve: The aortic valve is tricuspid. Aortic valve regurgitation is mild. Aortic regurgitation PHT measures 292 msec. Mild aortic stenosis is present. Aortic valve mean gradient measures 10.5 mmHg. Aortic valve peak gradient measures 21.6 mmHg. Aortic valve area, by VTI measures 1.46 cm. Pulmonic Valve: The pulmonic valve was not well visualized. Pulmonic valve regurgitation is not visualized. No evidence of pulmonic stenosis. Aorta: The aortic root is normal in size and structure. Venous: The inferior vena cava is normal in size with greater than 50% respiratory variability, suggesting right atrial pressure of 3 mmHg. IAS/Shunts: No atrial level shunt detected by color flow Doppler. Additional Comments: Akinesis of the distal septum and apex; overall mildly reduced LV function; no thrombus noted using definity. A pacer wire is visualized.  LEFT VENTRICLE PLAX 2D LVOT diam:     2.00 cm LV SV:         49 LV SV Index:   29 LVOT Area:     3.14 cm  RIGHT VENTRICLE            IVC RV Basal diam:  3.30 cm    IVC diam: 2.10 cm RV S prime:     9.46 cm/s TAPSE (M-mode): 1.3 cm LEFT ATRIUM            Index       RIGHT ATRIUM           Index LA Vol (A2C): 81.4 ml  47.46 ml/m RA Area:     31.90 cm LA Vol (A4C): 102.0 ml 59.47 ml/m RA Volume:   109.00 ml 63.55 ml/m  AORTIC VALVE AV Area (Vmax):    1.42 cm AV Area (Vmean):   1.28 cm AV Area (VTI):     1.46 cm AV Vmax:           232.50 cm/s AV Vmean:          147.500 cm/s AV VTI:            0.335 m AV Peak Grad:      21.6 mmHg AV Mean Grad:      10.5 mmHg LVOT Vmax:         105.00 cm/s LVOT Vmean:        60.100 cm/s LVOT VTI:          0.156 m LVOT/AV VTI ratio: 0.47 AI PHT:            292 msec  AORTA Ao Root diam: 3.50 cm Ao Asc diam:  3.00 cm TRICUSPID VALVE TR Peak grad:   57.8 mmHg TR Vmax:        380.00 cm/s  SHUNTS Systemic VTI:  0.16 m Systemic Diam: 2.00 cm  Kirk Ruths MD Electronically signed by Kirk Ruths MD Signature Date/Time: 10/07/2020/4:08:38 PM    Final     CT HEAD CODE STROKE WO CONTRAST   Result Date: 10/06/2020 CLINICAL DATA:  Code stroke. 86 year old male with new onset left weakness. EXAM: CT HEAD WITHOUT CONTRAST TECHNIQUE: Contiguous axial images were obtained from the base of the skull through the vertex without intravenous contrast. COMPARISON:  CT head 07/02/2020. FINDINGS: Brain: No midline shift, mass effect, or evidence of intracranial mass lesion. No acute intracranial hemorrhage identified. No ventriculomegaly. Stable gray-white matter differentiation throughout the brain. No acute cortically based infarct identified. Chronic left cerebellar infarct. Mild chronic encephalomalacia in the right occipital pole. Chronic lacunar infarct left thalamus. Vascular: Extensive Calcified  atherosclerosis at the skull base. New hyperdense right MCA (series 4, image 28). Skull: No acute osseous abnormality identified. Sinuses/Orbits: Stable paranasal sinuses and mastoids, chronic right mastoid effusion. Other: Rightward gaze deviation is new. Stable scalp. ASPECTS [facility] Stroke [facility] Early CT Score) Total score (0-10 with 10 being normal): 10 IMPRESSION: 1. Hyperdense Right MCA suspicious for Emergent Large Vessel Occlusion. 2. No acute cortically based infarct or acute intracranial hemorrhage identified. ASPECTS 10. 3. Stable CT appearance of brain parenchyma since August. 4. Study discussed by telephone with Dr. Vonna Kotyk LONG on 10/06/2020 at 17:37. He advised that clinically the patient does not have fix right gaze, and that the presentation is more suspicious of sepsis than large vessel occlusion. Electronically Signed   By: Genevie Ann M.D.   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 Does the need for close, 24 hr/day medical supervision in concert with the patient's rehab  needs make it unreasonable for this patient to be served in a less intensive setting? Yes 2. Co-Morbidities requiring supervision/potential complications: carotid stenosis, chronic afib, tachy-brady syndrome s/p pacer, cardiomyopathy, pulmonary edema 3. Due to bladder management, bowel management, safety, skin/wound care, disease management, medication administration, pain management and patient education, does the patient require 24 hr/day rehab nursing? Yes 4. Does the patient require coordinated care of a physician, rehab nurse, therapy disciplines of PT, OT, SLP to address physical and functional deficits in the context of the above medical diagnosis(es)? Yes Addressing deficits in the following areas: balance, endurance, locomotion, strength, transferring, bowel/bladder control, bathing, dressing, feeding, grooming, toileting, cognition and speech 5. Can the patient actively participate in an intensive therapy program of at least 3 hrs of therapy per day at least 5 days per week? Yes 6. The potential for patient to make measurable gains while on inpatient rehab is excellent 7. Anticipated functional outcomes upon discharge from inpatient rehab are modified independent  with PT, modified independent with OT, modified independent with SLP. 8. Estimated rehab length of stay to reach the above functional goals is: 15-19 days 9. Anticipated discharge destination: Home 10. Overall Rehab/Functional Prognosis: excellent   RECOMMENDATIONS: This patient's condition is appropriate for continued rehabilitative care in the following setting: CIR Patient has agreed to participate in recommended program. Yes Note that insurance prior authorization may be required for reimbursement for recommended care.   Comment: Thank you for this consult. Admission coordinator to follow.    I have personally performed a face to face diagnostic evaluation, including, but not limited to relevant history and physical exam  findings, of this patient and developed relevant assessment and plan.  Additionally, I have reviewed and concur with the physician assistant's documentation above.   Leeroy Cha, MD   Cathlyn Parsons, PA-C 10/08/2020        Revision History                     Routing History           Note Details  Author Fernando Ribas, MD File Time 10/09/2020 12:37 PM  Author Type Physician Status Signed  Last Editor Fernando Ribas, MD Service Physical Medicine and [place] # [phone] Admit Date 10/14/2020

## 2020-10-16 NOTE — Progress Notes (Signed)
Speech Language Pathology Daily Session Note  Patient Details  Name: Fernando [name] MRN: [id number] Date of Birth: 01/28/1927  Today's Date: 10/16/2020 SLP Individual Time: 0832-0927 SLP Individual Time Calculation (min): 55 min  Short Term Goals: Week 1: SLP Short Term Goal 1 (Week 1): Pt will consume current diet with appreciable change in respiratory status or other vitals to indicate tolerance, and Mod A cues for use of compensatory swallow stategies. SLP Short Term Goal 2 (Week 1): Pt will consume trials of Dys 2 textures demonstrating efficinent mastication and oral clearance X3 prior to upgrade. SLP Short Term Goal 3 (Week 1): Pt will use compensatory strategies to increase speech intelligibility to 50% at the word level with Max A multimodal cues. SLP Short Term Goal 4 (Week 1): Pt will recall 1 safety precaution with Mod A cues for use of strategies and/or aids. SLP Short Term Goal 5 (Week 1): Pt will demonstrate ability to problem solve basic functional and familiar situations with Mod A cues. SLP Short Term Goal 6 (Week 1): Pt will identify 1 physical and 1 cognitive-linguistic deficit in setting of acute CVA with Mod A cues.  Skilled Therapeutic Interventions: Pt was seen for skilled ST targeting dysphagia and speech goals. Pt appeared to have labored breathing and RN present for portion of session. His SpO2 fluctuated between 88-95 throughout session. He expressed desire to try some of his breakfast, but unable to hold the spoon for self feeding today (was able to do this with SLP yesterday). Pt consumed ~2 oz of applesauce with intermittent throat clearing. Intermittent dry coughing and 1 strong cough response noted after large consecutive sips of thin H2O. His overall PO intake is very limited at this time. ST will continue to monitor tolerance of current diet from oropharyngeal standpoint and potential need for repeat MBSS when more medically stable. Remainder of session focused on  speech intelligibility at the word level. When presented with pictures, he verbalized 1-3 syllable object names with ~80% intelligibility and Moderate verbal cues for increased vocal intensity. Pt's current respiratory status further complicates his intelligibility right now, as some multisyllabic words and phrases require pacing in order for pt to maintain adequate breath support for intelligible speech. His speech during unstructured tasks was ~50% intelligible today at the short phrase level, although verbalizations in general were limited. Pt left laying in bed with alarm set and needs within reach. Continue per current plan of care.           Pain Pain Assessment Pain Scale: Faces Pain Score: 0-No pain Faces Pain Scale: No hurt  Therapy/Group: Individual Therapy  [name] 10/16/2020, 12:15 PM

## 2020-10-16 NOTE — Progress Notes (Signed)
Physical Therapy Session Note  Patient Details  Name: Fernando [name] MRN: [id number] Date of Birth: 1927/06/30  Today's Date: 10/16/2020 PT Individual Time: 1415-1501 PT Individual Time Calculation (min): 46 min  and Today's Date: 10/16/2020 PT Missed Time: 29 Minutes Missed Time Reason: Patient fatigue  Short Term Goals: Week 1:  PT Short Term Goal 1 (Week 1): Pt will be able to perform bed mobility with mod assist overall PT Short Term Goal 2 (Week 1): Pt will be able to perform bed <> chair transfer with mod assist PT Short Term Goal 3 (Week 1): Pt will be able to perform sit <> stands with mod +2  Skilled Therapeutic Interventions/Progress Updates:    Patient received supine in bed, asleep. He was difficult to rouse with noted labored mouth breathing. He demonstrates [employer]- like breathing pattern. Increased edema noted to B UE with areas of increased ecchymosis. Patient unable to verbalize if ecchymosis is new or how it occurred. Per RN, it's not new, but has been worsening as edema worsens. PT providing gentle effleurage to B fingers, hands, forearms to assist with edema management. When attempting to transition to sitting edge of bed to work on sitting balance/bed mobility, patient shaking his head "no" and falling back asleep. After PT was able to rouse patient again, he was able to verbalize "tired." Patient remaining in bed, B UE/LE supported on pillows to assist with edema management, bed alarm on, call light within reach.   Therapy Documentation Precautions:  Precautions Precautions: Fall, ICD/Pacemaker Precaution Comments: L sided weakness; very HOH; dysarthria Restrictions Weight Bearing Restrictions: No    Therapy/Group: Individual Therapy  [name], PT, DPT, CBIS  10/16/2020, 7:51 AM

## 2020-10-16 NOTE — Progress Notes (Signed)
[facility] PHYSICAL MEDICINE & REHABILITATION PROGRESS NOTE   Subjective/Complaints: Breathing labored this morning. As per nurse and SLP he is a mouth breather and breathing worsens while he is eating. CXR reviewed and does show pleural effusions and pulmonary edema. He also has bilateral arm swelling.   ROS- cannot obtain due to severe dysarthria   Objective:   DG Chest 2 View  Result Date: 10/15/2020 CLINICAL DATA:  Leukocytosis.  Hypertension. EXAM: CHEST - 2 VIEW COMPARISON:  October 09, 2020 FINDINGS: Cardiomegaly. The hila and mediastinum are normal. The 2 lead pacemaker is stable. No pneumothorax. Diffuse increased interstitial opacities in the lungs. Probable small effusions with underlying atelectasis. IMPRESSION: Diffuse interstitial opacities may represent pulmonary edema. An infectious process is not completely excluded. There is also small right pleural effusion and there may be a tiny left effusion. Electronically Signed   By: Dorise Bullion III M.D   On: 10/15/2020 17:01   Recent Labs    10/14/20 1756 10/16/20 0836  WBC 14.8* 12.7*  HGB 10.0* 8.8*  HCT 32.8* 28.2*  PLT 325 292   Recent Labs    10/14/20 0327 10/16/20 0836  NA 143 140  K 3.9 3.6  CL 112* 109  CO2 20* 20*  GLUCOSE 105* 98  BUN 26* 26*  CREATININE 1.03 1.01  CALCIUM 7.6* 7.9*    Intake/Output Summary (Last 24 hours) at 10/16/2020 1057 Last data filed at 10/16/2020 0436 Gross per 24 hour  Intake 541.86 ml  Output 550 ml  Net -8.14 ml     Pressure Injury 10/14/20 Buttocks Left Deep Tissue Pressure Injury - Purple or maroon localized area of discolored intact skin or blood-filled blister due to damage of underlying soft tissue from pressure and/or shear. Purple/red area small fluid filled  (Active)  10/14/20 1700  Location: Buttocks  Location Orientation: Left  Staging: Deep Tissue Pressure Injury - Purple or maroon localized area of discolored intact skin or blood-filled blister due to  damage of underlying soft tissue from pressure and/or shear.  Wound Description (Comments): Purple/red area small fluid filled blister noted at center  Present on Admission: Yes    Physical Exam: Vital Signs Blood pressure 117/75, pulse 64, temperature 98.2 F (36.8 C), temperature source Oral, resp. rate (!) 22, height 5\' 5"  (1.651 m), weight 66 kg, SpO2 93 %. Gen: labored breathing, tachypneic HEENT: oral mucosa pink and moist, NCAT Cardio: Reg rate Chest: labored breathing, tachypneic Abd: soft, non-distended Ext: no edema Skin: intact Psych: pleasant, normal affect Neurologic: Cranial nerves II through XII intact, motor strength is 4+/5 in right 4- left deltoid, bicep, tricep, grip, hip flexor, knee extensors, ankle dorsiflexor and plantar flexor Sensory exam normal sensation to light touch and proprioception in bilateral upper and lower extremities Musculoskeletal: Full range of motion in all 4 extremities. No joint swelling   Assessment/Plan: 1. Functional deficits which require 3+ hours per day of interdisciplinary therapy in a comprehensive inpatient rehab setting.  Physiatrist is providing close team supervision and 24 hour management of active medical problems listed below.  Physiatrist and rehab team continue to assess barriers to discharge/monitor patient progress toward functional and medical goals  Care Tool:  Bathing  Bathing activity did not occur: Refused           Bathing assist       Upper Body Dressing/Undressing Upper body dressing   What is the patient wearing?: Pull over shirt    Upper body assist Assist Level: Maximal Assistance - Patient  25 - 49%    Lower Body Dressing/Undressing Lower body dressing      What is the patient wearing?: Incontinence brief     Lower body assist Assist for lower body dressing: 2 Helpers     Toileting Toileting    Toileting assist Assist for toileting: 2 Helpers     Transfers Chair/bed  transfer  Transfers assist     Chair/bed transfer assist level: 2 Helpers     Locomotion Ambulation   Ambulation assist   Ambulation activity did not occur: Safety/medical concerns          Walk 10 feet activity   Assist  Walk 10 feet activity did not occur: Safety/medical concerns        Walk 50 feet activity   Assist           Walk 150 feet activity   Assist Walk 150 feet activity did not occur: Safety/medical concerns         Walk 10 feet on uneven surface  activity   Assist Walk 10 feet on uneven surfaces activity did not occur: Safety/medical concerns         Wheelchair     Assist   Type of Wheelchair: Manual    Wheelchair assist level: Dependent - Patient 0% (attempts but unable to propel with UE's)      Wheelchair 50 feet with 2 turns activity    Assist        Assist Level: Dependent - Patient 0%   Wheelchair 150 feet activity     Assist      Assist Level: Dependent - Patient 0%   Blood pressure 117/75, pulse 64, temperature 98.2 F (36.8 C), temperature source Oral, resp. rate (!) 22, height 5\' 5"  (1.651 m), weight 66 kg, SpO2 93 %.    Medical Problem List and Plan: 1.Left side weakness dysarthria/dysphagiasecondary to right MCA infarction due to right ICA high-grade stenosis status post angioplasty -patient may  shower -ELOS/Goals: 17 to 21 days  -Continue CIR 2. Antithrombotics: -DVT/anticoagulation: Per neurology stopping Xarelto-antiplatelet therapy: Aspirin81 mg daily 3. Pain Management:Tylenol as needed. Denies pain.  4. Mood:Provide emotional support -antipsychotic agents: N/A 5. Neuropsych: This patientiscapable of making decisions on hisown behalf. 6. Skin/Wound Care:Routine skin checks 7. Fluids/Electrolytes/Nutrition:Routine in and outs with follow-up chemistries, poor intake start IVF at noc  8. Atrial fibrillation/tachybradycardia  syndrome/pacemaker. Cardiac rate controlled. Continue Toprol25 mg daily 9. SCC of the carotid gland. Follow-up outpatient 10. Hypertension. Toprol 25 mg daily. Monitor with increased mobility Vitals:   10/16/20 0852 10/16/20 1036  BP: 134/80 117/75  Pulse: 67 64  Resp: 20 (!) 22  Temp: (!) 97.4 F (36.3 C) 98.2 F (36.8 C)  SpO2: 93% 93%   11. Hypothyroidism. Synthroid 12. ?Bacteremia but neg cultures .Ancef 2 g every 8 hours and complete10-daycourse, afebrile (5F) but has leufocytosis, see #18 13. Hyperlipidemia. Lipitor 14. Dysphagia. Dysphagia #1 thin liquids. Follow-up speech therapy 15.CKD stage III. Baseline 1.30-1.48. Follow-up chemistries 16. Acute blood loss anemia with history of GI bleed  Patient transfused 1 unit packed red blood cells 10/12/2020. Continue iron supplement. Protonix 40 mg twice daily. Follow-up CBC-10.7 on 11/20, 8.8 on 11/22.  17. Prediabetes. Hemoglobin A1c 5.7. SSI 18. CHF: Lasix 20 11/22 given tachypnea, labored breathing, extremity swelling, pulmonary edema and plural effusions on CXR. Cr normal.   LOS: 2 days A FACE TO FACE EVALUATION WAS PERFORMED  Fernando [name] 10/16/2020, 10:57 AM

## 2020-10-16 NOTE — Progress Notes (Signed)
   10/16/20 1512  Assess: MEWS Score  Temp 97.8 F (36.6 C)  BP (!) 143/53  Pulse Rate 63  Resp (!) 24  Level of Consciousness Alert  SpO2 90 %  O2 Device Room Air  Assess: MEWS Score  MEWS Temp 0  MEWS Systolic 0  MEWS Pulse 0  MEWS RR 1  MEWS LOC 0  MEWS Score 1  MEWS Score Color Green  Assess: if the MEWS score is Yellow or Red  Were vital signs taken at a resting state? Yes  Focused Assessment No change from prior assessment  Early Detection of Sepsis Score *See Row Information* Low  MEWS guidelines implemented *See Row Information* No, previously yellow, continue vital signs every 4 hours  Treat  MEWS Interventions Administered scheduled meds/treatments

## 2020-10-16 NOTE — Progress Notes (Signed)
Inpatient Rehabilitation  Patient information reviewed and entered into eRehab system by Chevie Birkhead M. Brailey Buescher, M.A., CCC/SLP, PPS Coordinator.  Information including medical coding, functional ability and quality indicators will be reviewed and updated through discharge.    

## 2020-10-16 NOTE — Progress Notes (Signed)
   10/16/20 1036  Assess: MEWS Score  Temp 98.2 F (36.8 C)  BP 117/75  Pulse Rate 64  Resp (!) 22  Level of Consciousness Alert  SpO2 93 %  O2 Device Room Air  Assess: MEWS Score  MEWS Temp 0  MEWS Systolic 0  MEWS Pulse 0  MEWS RR 1  MEWS LOC 0  MEWS Score 1  MEWS Score Color Green  Assess: if the MEWS score is Yellow or Red  Were vital signs taken at a resting state? Yes  Focused Assessment No change from prior assessment  Early Detection of Sepsis Score *See Row Information* Low  MEWS guidelines implemented *See Row Information* No, previously yellow, continue vital signs every 4 hours  Treat  MEWS Interventions Administered scheduled meds/treatments  Pain Scale 0-10  Pain Score 0

## 2020-10-16 NOTE — Progress Notes (Signed)
   10/16/20 0852  Assess: MEWS Score  Temp (!) 97.4 F (36.3 C)  BP 134/80  Pulse Rate 67  Resp 20  Level of Consciousness Alert  SpO2 93 %  O2 Device Room Air  Assess: MEWS Score  MEWS Temp 0  MEWS Systolic 0  MEWS Pulse 0  MEWS RR 0  MEWS LOC 0  MEWS Score 0  MEWS Score Color Green  Assess: if the MEWS score is Yellow or Red  Were vital signs taken at a resting state? Yes  Focused Assessment No change from prior assessment  Early Detection of Sepsis Score *See Row Information* Low  MEWS guidelines implemented *See Row Information* No, previously yellow, continue vital signs every 4 hours  Treat  MEWS Interventions Administered scheduled meds/treatments

## 2020-10-16 NOTE — Progress Notes (Signed)
Kirsteins, Luanna Salk, MD  Physician  Physical Medicine and Rehabilitation  PMR Pre-admission      Signed  Date of Service:  10/08/2020 12:00 PM      Related encounter: ED to Hosp-Admission (Discharged) from 10/06/2020 in [facility]      Signed       Show:Clear all [x] Manual[x] Template[x] Copied  Added by: [x] Cristina Gong, RN[x] Genella Mech, CCC-SLP[x] Michel Santee, PT  [] Hover for details PMR Admission Coordinator Pre-Admission Assessment   Patient: Fernando [name] is an 86 y.o., male MRN: [id number] DOB: Mar 08, 1927 Height: 5\' 5"  (165.1 cm) Weight: 70.9 kg                                                                                                                                                  Insurance Information HMO:    PPO:      PCP:      IPA:      80/20:      OTHER:  PRIMARY: [employer]      Policy#: [id number]      Subscriber: Pt.  CM Name: Lynelle Smoke      Phone#: [phone]     Fax#: Epic access Pre-Cert#: [id number] auth for CIR provided by Tammy with [employer].       Employer:  Benefits:  Phone #: [phone]     Name: 11/16 Eff. Date: 11/26/2015     Deduct: none      Out of Pocket Max: $3400      Life Max: none  CIR: $295 co pay per day days 1 until 6      SNF: no copay days 1 until 20; $178 co pay pe rday days 21 until 100 Outpatient: $15 per visit     Co-Pay: visit spe5r medcial neccesity Home Health: 100%      Co-Pay: visits per medcial ncessity DME: 80%     Co-Pay: 20% Providers: in network  SECONDARY: none    The "Data Collection Information Summary" for patients in Inpatient Rehabilitation Facilities with attached "Privacy Act [place] Records" was provided and verbally reviewed with: family   Emergency Contact Information         Contact Information     Name Relation Home Work Mobile    [place] Son [phone]   [phone]    Milana Obey Daughter [phone]   [phone]       Current  Medical History  Patient Admitting Diagnosis: CVA   History of Present Illness:: Fernando [name] is a 86 y.o. right-handed male with history of atrial fibrillation maintained on Xarelto, hypertension, CAD/pacemaker, hyperlipidemia, prior left CEA, SCC of the carotid gland and recent TIA August 2021.  He quit smoking 47 years ago.  Per chart review patient lives with his children.  He was receiving assist for ADLs and home care.  1 level home 6  steps to entry.  Presented 10/07/2020 to [facility] left side weakness as well as dysarthria.  Cranial CT scan showed hyperdense right MCA suspicious for emergent large vessel occlusion.  No acute cortically based infarct or acute intracranial hemorrhage.  Patient did not receive TPA.  Echocardiogram with ejection fraction of 40 to 45%.  Without thrombus.  CTA of head and neck no emergent large vessel occlusion.  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  Delayed transit throughout the entire right cerebral hemisphere and left ACA territory related to high-grade proximal right ICA stenosis and chronically diminutive disease left ACA.  Patient underwent ICA angioplasty per interventional radiology.  He did require intubation for a short time.  Hospital course fever blood cultures 2/2 G+ cocci and currently maintained on Rocephin.  He was cleared to begin aspirin for CVA prophylaxis for right MCA infarct due to right ICA high-grade stenosis.  Lovenox was added for DVT prophylaxis.      Complete NIHSS TOTAL: 12 Glasgow Coma Scale Score: 15   Past Medical History      Past Medical History:  Diagnosis Date  . Arthritis    . Atrial fibrillation ([place])    . Carotid artery disease ([place])      Dr. Kellie Simmering - s/p left CEA  . Coronary artery disease    . Essential hypertension, benign    . Hyperlipidemia    . 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 This mass was initially felt to be 1.7 cm at the time of presentation but had enlarged significantly to 4 cm clinically at the time of surgery. Pathologically it was 3.4 cm. A lymph node deep to the parotid was enlarged clinically at the time of surgery but was negative   . Tachycardia-bradycardia syndrome ([place])        Family History  family history includes Cancer in his brother and sister; Cirrhosis in his mother; Heart attack in his sister; Heart disease in his sister.   Prior Rehab/Hospitalizations:  Has the patient had prior rehab or hospitalizations prior to admission? Yes   Has the patient had major surgery during 100 days prior to admission? Yes   Current Medications    Current Facility-Administered Medications:  .  acetaminophen (TYLENOL) tablet 650 mg, 650 mg, Oral, Q6H PRN **OR** acetaminophen (TYLENOL) suppository 650 mg, 650 mg, Rectal, Q6H PRN, Lelon Perla, MD .  aspirin EC tablet 81 mg, 81 mg, Oral, Daily, Rosalin Hawking, MD, 81 mg at 10/13/20 0954 .  atorvastatin (LIPITOR) tablet 20 mg, 20 mg, Oral, Daily, Lelon Perla, MD, 20 mg at 10/13/20 0955 .  ceFAZolin (ANCEF) IVPB 2g/100 mL premix, 2 g, Intravenous, Q8H, Pham, Minh Q, RPH-CPP, Last Rate: 200 mL/hr at 10/13/20 0955, 2 g at 10/13/20 0955 .  ferrous sulfate tablet 325 mg, 325 mg, Oral, TID WC, Rosalin Hawking, MD, 325 mg at 10/13/20 0829 .  insulin aspart (novoLOG) injection 0-9 Units, 0-9 Units, Subcutaneous, Q4H, Lelon Perla, MD, 1 Units at 10/12/20 2201 .  levothyroxine (SYNTHROID) tablet 50 mcg, 50 mcg, Oral, Q0600, Lelon Perla, MD, 50 mcg at 10/13/20 0520 .  loperamide (IMODIUM) capsule 2 mg, 2 mg, Oral, Q8H PRN, Rosalin Hawking, MD .  MEDLINE mouth rinse, 15 mL, Mouth Rinse, BID, Lelon Perla, MD, 15 mL at 10/13/20 0956 .  metoprolol succinate (TOPROL-XL) 24 hr tablet 25 mg, 25  mg, Oral, Daily, Rosalin Hawking, MD, 25 mg at 10/13/20 0954 .  pantoprazole sodium (PROTONIX) 40  mg/20 mL oral suspension 40 mg, 40 mg, Oral, BID, Heard, Courtney S, NP, 40 mg at 10/13/20 7510   Patients Current Diet:     Diet Order                      DIET - DYS 1 Room service appropriate? Yes with Assist; Fluid consistency: Thin  Diet effective now                    MBS 11/15 and began Dysphagia 1 diet with thin liquids, meds crushed with puree.    Precautions / Restrictions Precautions Precautions: Fall Precaution Comments: rectal tube Restrictions Weight Bearing Restrictions: No    Has the patient had 2 or more falls or a fall with injury in the past year?Yes   Prior Activity Level Limited Community (1-2x/wk): Pt. went out for appointments  but mostly stayed at home.  Since August had good days and bad days. Use RW   Prior Functional Level Prior Function Level of Independence: Needs assistance Comments: Per neurology note, pt received assist for ADLs and home care such as cooking, but pt did not need assist for mobility. Pt gave inconsistent answers during history in session   Self Care: Did the patient need help bathing, dressing, using the toilet or eating?  Independent   Indoor Mobility: Did the patient need assistance with walking from room to room (with or without device)? Independent   Stairs: Did the patient need assistance with internal or external stairs (with or without device)? Needed some help   Functional Cognition: Did the patient need help planning regular tasks such as shopping or remembering to take medications? Needed some help   Home Assistive Devices / Erma Devices/Equipment: Dentures (specify type), Bedside commode/3-in-1 Home Equipment: Walker - 2 wheels, Cane - single point, Bedside commode, Shower seat - built in   Prior Device Use: Indicate devices/aids used by the patient prior to current illness, exacerbation or injury? Walker just recently. Has his ggod days and his bad days since August 2021   Current  Functional Level Cognition   Arousal/Alertness: Lethargic Overall Cognitive Status: Impaired/Different from baseline Difficult to assess due to: Hard of hearing/deaf Current Attention Level: Selective Orientation Level: Oriented to person, Oriented to place, Oriented to situation Following Commands: Follows one step commands with increased time Safety/Judgement: Decreased awareness of safety, Decreased awareness of deficits General Comments: Pt is HOH and sometiimes difficult to assist.  Granddaughter present and reports he is seeming closer to baaseline.  Does require increased time and multimodal cueing Safety/Judgment: Impaired    Extremity Assessment (includes Sensation/Coordination)   Upper Extremity Assessment: Generalized weakness, LUE deficits/detail, Difficult to assess due to impaired cognition LUE Deficits / Details: poor coordination and diffuse weakness; not able to full target objects when asked >90s of shoulder flexion. Appears to have inattention to this UE  Lower Extremity Assessment: Defer to PT evaluation LLE Deficits / Details: able to move through full ROM with assist, partial ROM without assist. no noted knee buckling with stance     ADLs   Overall ADL's : Needs assistance/impaired Eating/Feeding: Supervision/ safety, Set up, Minimal assistance, Sitting Eating/Feeding Details (indicate cue type and reason): light MIN A to facilitate AROM of LUE Grooming: Minimal assistance, Sitting Upper Body Bathing: Moderate assistance, Sitting Lower Body Bathing: Maximal assistance, +2 for physical assistance, +2  for safety/equipment, Sit to/from stand Upper Body Dressing : Moderate assistance, Sitting Lower Body Dressing: Maximal assistance, Sitting/lateral leans, Sit to/from stand Toilet Transfer: Maximal assistance, +2 for physical assistance, +2 for safety/equipment Toilet Transfer Details (indicate cue type and reason): simulated via functional mobility MAX A +2 to pivot to  recliner Toileting- Clothing Manipulation and Hygiene: Total assistance, Sit to/from stand Toileting - Clothing Manipulation Details (indicate cue type and reason): for posterior pericare in standing Functional mobility during ADLs: Maximal assistance, +2 for physical assistance (stand pivot transfer) General ADL Comments: pt continues to present with decreased activity tolerance, generalized weakness decreased functional ROM of LUE     Mobility   Overal bed mobility: Needs Assistance Bed Mobility: Supine to Sit Rolling: Mod assist Sidelying to sit: Mod assist, +2 for physical assistance Supine to sit: HOB elevated, Mod assist General bed mobility comments: Pt requiring assist for L LE to EOB and to lift trunk; good initiation with lifting trunk but did requiring mod A and mod A to scoot forward; required cues for sequencing     Transfers   Overall transfer level: Needs assistance Equipment used: 1 person hand held assist, 2 person hand held assist (back of chari) Transfers: Sit to/from Stand, Risk manager Sit to Stand: Max assist, +2 physical assistance, From elevated surface Stand pivot transfers: Max assist, +2 physical assistance General transfer comment: 3 x sit to stand from elevated bed with max A of 2 to complete. Pt with mod A initially but required max x 2 to come upright and still with difficulty tucking buttock.  Max x 2 pivot to chair toward L side     Ambulation / Gait / Stairs / Wheelchair Mobility   Ambulation/Gait Ambulation/Gait assistance: Max assist, +2 physical assistance Gait Distance (Feet): 3 Feet Assistive device: Rolling walker (2 wheeled) Gait Pattern/deviations: Step-to pattern, Decreased stride length, Shuffle, Leaning posteriorly, Trunk flexed General Gait Details: NT - DOE 2/4, LE buckling Gait velocity interpretation: <1.31 ft/sec, indicative of household ambulator     Posture / Balance Dynamic Sitting Balance Sitting balance - Comments: close  supervision for static sitting; able to do without UE but preferred to have UE support Balance Overall balance assessment: Needs assistance Sitting-balance support: Feet supported, No upper extremity supported, Single extremity supported Sitting balance-Leahy Scale: Fair Sitting balance - Comments: close supervision for static sitting; able to do without UE but preferred to have UE support Postural control: Left lateral lean, Posterior lean Standing balance support: During functional activity Standing balance-Leahy Scale: Zero Standing balance comment: Unable to stand completely upright and required max A x 2; performed 3 stands and 8 partial stands from chair     Special needs/care consideration Rectal pouch 11/15/2021and external urinary catheter Hgb A1c 5.7 Code status DNR on acute   Rocephin for bacteremia LOT to be determined    Previous Home Environment  Living Arrangements: Children  Lives With: Son Available Help at Discharge: Family Type of Home: House Home Layout: One level Home Access: Stairs to enter Entrance Stairs-Rails: Right, Left Entrance Stairs-Number of Steps: 5 Bathroom Shower/Tub: Chiropodist: Handicapped height Bathroom Accessibility: Yes Home Care Services: No Additional Comments: information from previous charting, pt unable to state clear answers to history questions or consistently answer yes/no. No family present at eval   Discharge Living Setting Does the patient have any problems obtaining your medications?: No   Social/Family/Support Systems Patient Roles: Other (Comment) Contact Information: [phone] Anticipated Caregiver: Najib Colmenares Anticipated Caregiver's Contact Information:  [phone] Ability/Limitations of Caregiver:  (Can provide min-mod A) Caregiver Availability: 24/7 Discharge Plan Discussed with Primary Caregiver: Yes Is Caregiver In Agreement with Plan?: Yes Does Caregiver/Family have Issues with  Lodging/Transportation while Pt is in Rehab?: No   Goals Patient/Family Goal for Rehab: PT/OT/OSLP mod A (PT/OT/SLP mod A) Expected length of stay: 17-21 days Pt/Family Agrees to Admission and willing to participate: Yes Program Orientation Provided & Reviewed with Pt/Caregiver Including Roles  & Responsibilities: Yes   Decrease burden of Care through IP rehab admission: n/a   Possible need for SNF placement upon discharge: not anticipated    Patient Condition: This patient's medical and functional status has changed since the consult dated: 10/09/2020 in which the Rehabilitation Physician determined and documented that the patient's condition is appropriate for intensive rehabilitative care in an inpatient rehabilitation facility. See "History of Present Illness" (above) for medical update. Functional changes are: pt max +2 for mobility and adls. Patient's medical and functional status update has been discussed with the Rehabilitation physician and patient remains appropriate for inpatient rehabilitation. Will admit to inpatient rehab today.   Preadmission Screen Completed By: Danne Baxter RN MSN with updates by Michel Santee, PT, 10/13/2020 11:05 AM ______________________________________________________________________   Discussed status with Dr. Letta Pate on 10/13/20 at  11:05 AM  and received approval for admission today.   Admission Coordinator: Danne Baxter RN MSN with updates by Michel Santee, time 11:05 AM Sudie Grumbling 10/13/20              Revision History                                         Note Details  Author Charlett Blake, MD File Time 10/14/2020  2:49 PM  Author Type Physician Status Signed  Last Editor Charlett Blake, MD Service Physical Medicine and [place] # [phone] Admit Date 10/14/2020

## 2020-10-16 NOTE — Progress Notes (Signed)
[place] Individual Statement of Services  Patient Name:  Fernando [name]  Date:  10/16/2020  Welcome to the [place].  Our goal is to provide you with an individualized program based on your diagnosis and situation, designed to meet your specific needs.  With this comprehensive rehabilitation program, you will be expected to participate in at least 3 hours of rehabilitation therapies Monday-Friday, with modified therapy programming on the weekends.  Your rehabilitation program will include the following services:  Physical Therapy (PT), Occupational Therapy (OT), Speech Therapy (ST), 24 hour per day rehabilitation nursing, Therapeutic Recreaction (TR), Care Coordinator, Rehabilitation Medicine, Nutrition Services, Pharmacy Services and Other  Weekly team conferences will be held on Wednesday to discuss your progress.  Your Inpatient Rehabilitation Care Coordinator will talk with you frequently to get your input and to update you on team discussions.  Team conferences with you and your family in attendance may also be held.  Expected length of stay: 17-21 Days  Overall anticipated outcome: Mod A  Depending on your progress and recovery, your program may change. Your Inpatient Rehabilitation Care Coordinator will coordinate services and will keep you informed of any changes. Your Inpatient Rehabilitation Care Coordinator's name and contact numbers are listed  below.  The following services may also be recommended but are not provided by the [place]:    [place] will be made to provide these services after discharge if needed.  Arrangements include referral to agencies that provide these services.  Your insurance has been verified to be:  [employer]  Your primary doctor is:  Rosita Fire, MD  Pertinent information will be shared  with your doctor and your insurance company.  Inpatient Rehabilitation Care Coordinator:  Erlene Quan, [place] or [phone]  Information discussed with and copy given to patient by: Dyanne Iha, 10/16/2020, 10:45 AM

## 2020-10-16 NOTE — Progress Notes (Signed)
Occupational Therapy Session Note  Patient Details  Name: Fernando [name] MRN: [id number] Date of Birth: 1927/07/28  Today's Date: 10/16/2020 OT Individual Time: 5102-5852 OT Individual Time Calculation (min): 46 min    Short Term Goals: Week 1:  OT Short Term Goal 1 (Week 1): Pt will don shirt with mod A OT Short Term Goal 2 (Week 1): Pt will maintain sitting balance EOB with CGA to complete 1 ADL OT Short Term Goal 3 (Week 1): Pt will complete self feeding with LRAD with min A  Skilled Therapeutic Interventions/Progress Updates:    Pt seen for OT treatment this am with pt resting in bed to start and nursing present.  He exhibits decreased hearing, requiring multiple cueing to answer questions from therapist and at times, he did not reply to some when asked.  He was able to roll in the bed with overall mod assist side to side with max assist for donning his pull up pants. Therapist provided total assist for donning his TEDs and gripper socks.  He then transitioned from supine to sit EOB with max assist.  He was able to maintain static sitting at the EOB with close supervision.  Mod assist was needed for scooting out to the edge in preparation for transfer to the wheelchair.  Squat pivot transfer to the right was completed with overall total assist +2 (pt 20%) with pt resisting weightshift to the seat on the left side.  Finished session with pt in the wheelchair and his granddaughter present in the room.  Educated her on tilting wheelchair back every 45 mins for pressure relief.  Positioned LUE on half lap tray for support as well.  Call button and phone within reach with safety belt in place.    Therapy Documentation Precautions:  Precautions Precautions: Fall, ICD/Pacemaker Precaution Comments: L sided weakness; very HOH; dysarthria Restrictions Weight Bearing Restrictions: No  Pain: Pain Assessment Pain Scale: Faces Pain Score: 0-No pain Faces Pain Scale: No hurt ADL: See Care Tool  Section for some details of mobility and selfcare  Therapy/Group: Individual Therapy  [name] OTR/L 10/16/2020, 3:51 PM

## 2020-10-16 NOTE — Plan of Care (Signed)
  Problem: RH SKIN INTEGRITY Goal: RH STG SKIN FREE OF INFECTION/BREAKDOWN Description: Skin free of breakdown and infection with mod assist Outcome: Progressing   Problem: RH BLADDER ELIMINATION Goal: RH STG MANAGE BLADDER WITH ASSISTANCE Description: STG Manage Bladder With Assistance Mod Outcome: Progressing

## 2020-10-16 NOTE — Progress Notes (Signed)
Entered patient room, found him to be be difficult to arouse to voice and tactile stimulation. Respirations labored with periods of apnea, with O2 sat. fluctuations. Contacted rapid response nurse. Nurse on unit assess patient.

## 2020-10-16 NOTE — Progress Notes (Signed)
Patient Details  Name: Fernando [name] MRN: [id number] Date of Birth: 04-06-1927  Today's Date: 10/16/2020  Hospital Problems: Active Problems:   Right middle cerebral artery stroke [facility])  Past Medical History:  Past Medical History:  Diagnosis Date  . Arthritis   . Atrial fibrillation ([place])   . Carotid artery disease ([place])    Dr. Kellie Simmering - s/p left CEA  . Coronary artery disease   . Essential hypertension, benign   . Hyperlipidemia   . 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 This mass was initially felt to be 1.7 cm at the time of presentation but had enlarged significantly to 4 cm clinically at the time of surgery. Pathologically it was 3.4 cm. A lymph node deep to the parotid was enlarged clinically at the time of surgery but was negative   . Tachycardia-bradycardia syndrome [facility])    Past Surgical History:  Past Surgical History:  Procedure Laterality Date  . CAROTID ENDARTERECTOMY Left Jan. 3, 2009   CE  . CATARACT EXTRACTION W/ INTRAOCULAR LENS IMPLANT     Bilateral  . ESOPHAGOGASTRODUODENOSCOPY N/A 03/29/2018   Procedure: ESOPHAGOGASTRODUODENOSCOPY (EGD);  Surgeon: Danie Binder, MD;  [facility];  Service: Endoscopy;  Laterality: N/A;  . EYE SURGERY    . INSERT / REPLACE / REMOVE PACEMAKER    . IR INTRAVSC STENT CERV CAROTID W/EMB-PROT MOD SED INCL ANGIO  10/06/2020  . IR US GUIDE VASC ACCESS LEFT  10/06/2020  . IR US GUIDE VASC ACCESS RIGHT  10/06/2020  . IR US GUIDE VASC ACCESS RIGHT  10/06/2020  . PACEMAKER INSERTION  10-01-2012  . PARATHYROIDECTOMY  10/28/2012  . PAROTIDECTOMY  10/28/2012   Procedure: PAROTIDECTOMY;  Surgeon: Ascencion Dike, MD;  Location: [place];  Service: ENT;  Laterality: Right;  Total Parotidectomy  . PERMANENT PACEMAKER INSERTION N/A 10/01/2012   Procedure: PERMANENT PACEMAKER INSERTION;   Surgeon: Evans Lance, MD;  Location: [facility] CATH LAB;  Service: Cardiovascular;  Laterality: N/A;  . RADIOLOGY WITH ANESTHESIA N/A 10/07/2020   Procedure: MRI WITH ANESTHESIA;  Surgeon: Radiologist, Medication, MD;  Location: [place];  Service: Radiology;  Laterality: N/A;  . RADIOLOGY WITH ANESTHESIA N/A 10/06/2020   Procedure: IR WITH ANESTHESIA;  Surgeon: Luanne Bras, MD;  Location: [place];  Service: Radiology;  Laterality: N/A;  . TEE WITHOUT CARDIOVERSION N/A 10/10/2020   Procedure: TRANSESOPHAGEAL ECHOCARDIOGRAM (TEE);  Surgeon: Lelon Perla, MD;  Location: [facility] ENDOSCOPY;  Service: Cardiovascular;  Laterality: N/A;   Social History:  reports that he quit smoking about 47 years ago. His smoking use included cigarettes. He has never used smokeless tobacco. He reports current alcohol use of about 1.0 standard drink of alcohol per week. He reports that he does not use drugs.  Family / Support Systems Children: Fernando [name] (Son), Fernando [name] (Daughter) Other Supports: Niece, Field seismologist Anticipated Caregiver: Son Fernando [name]) Ability/Limitations of Caregiver: Min-Mod A Caregiver Availability: 24/7  Social History Preferred language: English Religion: Christian Read: Yes Write: Yes Employment Status: Retired   Abuse/Neglect Abuse/Neglect Assessment Can Be Completed: Yes Physical Abuse: Denies Verbal Abuse: Denies Sexual Abuse: Denies Exploitation of patient/patient's resources: Denies Self-Neglect: Denies  Emotional Status Pt's affect, behavior and adjustment status: Patient just would like to return home independent Recent Psychosocial Issues: no Psychiatric History: no Substance Abuse History: no  Patient / Family Perceptions, Expectations & Goals Pt/Family understanding of illness &  functional limitations: yes, family being provided with updates Premorbid pt/family roles/activities: Martin Majestic out for appointments, Uses RW.  Received assit for ADLS and home care (cooking, cleaning )  Independent with mobility Anticipated changes in roles/activities/participation: Family will continue to asssit Pt/family expectations/goals: Mod A  Community Resources Premorbid Home Care/DME Agencies: Other (Comment) (Bedside Commode, Rolling Walker, single point cane, 3n1, shower seat, electric bed) Transportation available at discharge: Family able to transport Resource referrals recommended: Neuropsychology (Coping CVA)  Discharge Planning Living Arrangements: Children (Son lives with patient) Support Systems: Children Type of Residence: Private residence (1 level home, 5 steps to enter (R, L railings)) Insurance Resources: Multimedia programmer (specify) (HTA) Financial Screen Referred: No Living Expenses: Own Money Management: Family, Patient Does the patient have any problems obtaining your medications?: No Home Management: Some assistance Patient/Family Preliminary Plans: Family will continue to asssit Care Coordinator Barriers to Discharge: Nutrition means, Incontinence Care Coordinator Barriers to Discharge Comments: Dys 1, Thin liq, external unirnary cath Care Coordinator Anticipated Follow Up Needs: HH/OP Expected length of stay: 17-21 Days  Clinical Impression SW called patient son introduced self, explained role and process. Addressed questions and concerns. Son concerned about information that nurse presented of patient being diagnosed with CHF. SW will have physician follow up. No addition questions or concerns. Son lives with patient and will have 24/7 assistance from son, daughter, niece and nephew.   Dyanne Iha 10/16/2020, 11:59 AM

## 2020-10-16 NOTE — Progress Notes (Signed)
Reassessed patient, took vital signs. Patient responsive to voice, regular labored respirations with periods of of tachypnea. Will update  oncoming physician.

## 2020-10-17 ENCOUNTER — Inpatient Hospital Stay (HOSPITAL_COMMUNITY): Payer: PPO | Admitting: Speech Pathology

## 2020-10-17 ENCOUNTER — Inpatient Hospital Stay (HOSPITAL_COMMUNITY): Payer: PPO | Admitting: Physical Therapy

## 2020-10-17 ENCOUNTER — Inpatient Hospital Stay (HOSPITAL_COMMUNITY): Payer: PPO | Admitting: Occupational Therapy

## 2020-10-17 DIAGNOSIS — I429 Cardiomyopathy, unspecified: Secondary | ICD-10-CM

## 2020-10-17 DIAGNOSIS — I4821 Permanent atrial fibrillation: Secondary | ICD-10-CM

## 2020-10-17 LAB — GLUCOSE, CAPILLARY
Glucose-Capillary: 94 mg/dL (ref 70–99)
Glucose-Capillary: 100 mg/dL — ABNORMAL HIGH (ref 70–99)
Glucose-Capillary: 97 mg/dL (ref 70–99)
Glucose-Capillary: 89 mg/dL (ref 70–99)
Glucose-Capillary: 114 mg/dL — ABNORMAL HIGH (ref 70–99)

## 2020-10-17 NOTE — IPOC Note (Signed)
Overall Plan of Care [facility]) Patient Details Name: Fernando [name] MRN: [id number] DOB: Nov 02, 1927  Admitting Diagnosis: <principal problem not specified>  Hospital Problems: Active Problems:   Right middle cerebral artery stroke [facility])     Functional Problem List: Nursing Bladder, Bowel, Endurance, Medication Management, Nutrition, Pain, Safety, Skin Integrity  PT Balance, Edema, Endurance, Motor, Pain, Perception, Safety, Sensory, Skin Integrity  OT Balance, Cognition, Safety, Endurance, Motor, Vision, Pain, Perception, Edema, Sensory, Skin Integrity  SLP Cognition, Linguistic, Nutrition  TR         Basic ADL's: OT Grooming, Bathing, Dressing, Toileting, Eating     Advanced  ADL's: OT       Transfers: PT Bed Mobility, Bed to Chair, Car, Manufacturing systems engineer, Metallurgist: PT Ambulation, Emergency planning/management officer, Stairs     Additional Impairments: OT Fuctional Use of Upper Extremity  SLP Swallowing, Communication, Social Cognition expression Problem Solving, Memory, Attention, Awareness  TR      Anticipated Outcomes Item Anticipated Outcome  Self Feeding set up assist  Swallowing  Supervision   Basic self-care  min A  Toileting  mod A   Bathroom Transfers mod A  Bowel/Bladder  maintain regular pattern of voiding, decrease incontinent episodes  Transfers  min assist  Locomotion  supervision w/c mobility; min assist short distance gait  Communication  Min A  Cognition  Min A  Pain  less than 3 or no pain  Safety/Judgment  remain free of falls, skin breakdown and infection   Therapy Plan: PT Intensity: Minimum of 1-2 x/day ,45 to 90 minutes PT Frequency: 5 out of 7 days (may require 15/7 pending endurance) PT Duration Estimated Length of Stay: 21-24 days OT Intensity: Minimum of 1-2 x/day, 45 to 90 minutes OT Frequency: 5 out of 7 days OT Duration/Estimated Length of Stay: 3-4 weeks SLP Intensity: Minumum of 1-2 x/day, 30 to 90 minutes SLP  Frequency: 3 to 5 out of 7 days SLP Duration/Estimated Length of Stay: 3-3.5 weeks   Due to the current state of emergency, patients may not be receiving their 3-hours of Medicare-mandated therapy.   Team Interventions: Nursing Interventions Patient/Family Education, Bowel Management, Bladder Management, Medication Management, Skin Care/Wound Management, Cognitive Remediation/Compensation, Dysphagia/Aspiration Precaution Training  PT interventions Ambulation/gait training, Training and development officer, Cognitive remediation/compensation, Community reintegration, Discharge planning, DME/adaptive equipment instruction, Disease management/prevention, Functional mobility training, Pain management, Neuromuscular re-education, Patient/family education, Psychosocial support, Skin care/wound management, Splinting/orthotics, Stair training, Therapeutic Activities, Therapeutic Exercise, UE/LE Strength taining/ROM, UE/LE Coordination activities, Visual/perceptual remediation/compensation, Wheelchair propulsion/positioning  OT Interventions Training and development officer, Community reintegration, Cognitive remediation/compensation, Discharge planning, Patient/family education, Self Care/advanced ADL retraining, Therapeutic Exercise, DME/adaptive equipment instruction, Functional mobility training, Pain management, Psychosocial support, Skin care/wound managment, Therapeutic Activities, UE/LE Strength taining/ROM, Visual/perceptual remediation/compensation, UE/LE Coordination activities, Wheelchair propulsion/positioning, Splinting/orthotics, Neuromuscular re-education, Functional electrical stimulation, Disease mangement/prevention  SLP Interventions Cognitive remediation/compensation, Cueing hierarchy, Dysphagia/aspiration precaution training, Functional tasks, Patient/family education, Therapeutic Activities, Internal/external aids, Speech/Language facilitation  TR Interventions    SW/CM Interventions Discharge  Planning, Psychosocial Support, Patient/Family Education   Barriers to Discharge MD  Medical stability and poor exercise tolerance  Nursing      PT Inaccessible home environment, Incontinence family looking into a ramp (currenlty has 5 steps)  OT      SLP      SW Nutrition means, Incontinence Dys 1, Thin liq, external unirnary cath   Team Discharge Planning: Destination: PT-Home ,OT- Home , SLP-Home Projected Follow-up: PT-Home health PT, 24 hour supervision/assistance, OT-  Home health OT, SLP-24 hour supervision/assistance, Home Health SLP Projected Equipment Needs: PT-Wheelchair cushion (measurements), Wheelchair (measurements), OT- To be determined, SLP-None recommended by SLP Equipment Details: PT-has RW, OT-  Patient/family involved in discharge planning: PT- Family member/caregiver, Patient,  OT-Patient, SLP-Patient  MD ELOS: 21-25d Medical Rehab Prognosis:  Fair Assessment:  86 year old right-handed male with history of atrial fibrillation maintained on Xarelto, hypertension,CKD stage III1.30-1.48, history of GI bleed 2019,CAD with pacemaker, hyperlipidemia, prior left CEA, SCC of the carotid gland and recent TIA August 2021.He quit smoking 47 years ago. Per chart review lives with children. He was receiving assistance for ADLs and home care. 1 level home 6 steps to entry. Presented 10/07/2020 [facility] left-sided weakness as well as dysarthria. Admission chemistries glucose 172 BUN 37 creatinine 1.49, lactic acid 5.0, BNP 1097, WBC 17,100, hemoglobin 9.1, hemoglobin A1c 5.7. Cranial CT scan showed hyperdense right MCA suspicious for emergent large vessel occlusion. No acute cortically based infarct or acute intracranial hemorrhage. Patient did not receive TPA. Carotid Doppler with right ICA stenosis 80-99%. Echocardiogram with ejection fraction of 40 to 45% without thrombus.TEE showed akinesis of the distal septum and apex ejection fraction 50% no thrombus  noted. No vegetation. CTAof the head and neck no emergent large vessel occlusion. 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 Delayed transit throughout the entire right cerebral hemisphere and left ACA territory related to high-grade proximal right ICA stenosis and chronically diminutive disease left ACA. Patient underwent ICA angioplasty per interventional radiology. He did require short-term intubation. Hospital course fever blood cultures 2/2 G+ cocci maintained on Rocephin. He was cleared to begin aspirin for CVA prophylaxis for right MCA infarction due to right ICA high-grade stenosisand AWAIT PLAN TO [place].Currently on a dysphagia #1 thin liquid diet.Blood culture 2/2 group B strep pansensitive placed on Ancef therapy evaluations completed and patient was admitted for a comprehensive rehab program    Now requiring 24/7 Rehab RN,MD, as well as CIR level PT, OT and SLP.  Treatment team will focus on ADLs and mobility with goals set at [facility] A/Mod A See Team Conference Notes for weekly updates to the plan of care

## 2020-10-17 NOTE — Progress Notes (Signed)
[place] PHYSICAL MEDICINE & REHABILITATION PROGRESS NOTE   Subjective/Complaints:  Resting in bed mildly tachypneic, denies pains , no cough   ROS- cannot obtain due to severe dysarthria   Objective:   DG Chest 2 View  Result Date: 10/15/2020 CLINICAL DATA:  Leukocytosis.  Hypertension. EXAM: CHEST - 2 VIEW COMPARISON:  October 09, 2020 FINDINGS: Cardiomegaly. The hila and mediastinum are normal. The 2 lead pacemaker is stable. No pneumothorax. Diffuse increased interstitial opacities in the lungs. Probable small effusions with underlying atelectasis. IMPRESSION: Diffuse interstitial opacities may represent pulmonary edema. An infectious process is not completely excluded. There is also small right pleural effusion and there may be a tiny left effusion. Electronically Signed   By: Dorise Bullion III M.D   On: 10/15/2020 17:01   Recent Labs    10/14/20 1756 10/16/20 0836  WBC 14.8* 12.7*  HGB 10.0* 8.8*  HCT 32.8* 28.2*  PLT 325 292   Recent Labs    10/16/20 0836  NA 140  K 3.6  CL 109  CO2 20*  GLUCOSE 98  BUN 26*  CREATININE 1.01  CALCIUM 7.9*    Intake/Output Summary (Last 24 hours) at 10/17/2020 0738 Last data filed at 10/17/2020 0538 Gross per 24 hour  Intake 650 ml  Output 700 ml  Net -50 ml     Pressure Injury 10/14/20 Buttocks Left Deep Tissue Pressure Injury - Purple or maroon localized area of discolored intact skin or blood-filled blister due to damage of underlying soft tissue from pressure and/or shear. Purple/red area small fluid filled  (Active)  10/14/20 1700  Location: Buttocks  Location Orientation: Left  Staging: Deep Tissue Pressure Injury - Purple or maroon localized area of discolored intact skin or blood-filled blister due to damage of underlying soft tissue from pressure and/or shear.  Wound Description (Comments): Purple/red area small fluid filled blister noted at center  Present on Admission: Yes    Physical Exam: Vital  Signs Blood pressure 140/65, pulse 65, temperature (!) 97.4 F (36.3 C), resp. rate 19, height 5\' 5"  (1.651 m), weight 67.7 kg, SpO2 91 %. Gen: labored breathing, tachypneic HEENT: oral mucosa pink and moist, NCAT Cardio: Reg rate Chest: labored breathing, tachypneic Abd: soft, non-distended Ext: no edema Skin: intact Psych: pleasant, normal affect Neurologic: Cranial nerves II through XII intact, motor strength is 4+/5 in right 4- left deltoid, bicep, tricep, grip, hip flexor, knee extensors, ankle dorsiflexor and plantar flexor Sensory exam normal sensation to light touch and proprioception in bilateral upper and lower extremities Musculoskeletal: Full range of motion in all 4 extremities. No joint swelling   Assessment/Plan: 1. Functional deficits which require 3+ hours per day of interdisciplinary therapy in a comprehensive inpatient rehab setting.  Physiatrist is providing close team supervision and 24 hour management of active medical problems listed below.  Physiatrist and rehab team continue to assess barriers to discharge/monitor patient progress toward functional and medical goals  Care Tool:  Bathing  Bathing activity did not occur: Refused           Bathing assist       Upper Body Dressing/Undressing Upper body dressing   What is the patient wearing?: Pull over shirt    Upper body assist Assist Level: Maximal Assistance - Patient 25 - 49%    Lower Body Dressing/Undressing Lower body dressing      What is the patient wearing?: Pants     Lower body assist Assist for lower body dressing: Maximal Assistance - Patient  28 - 49%     Toileting Toileting    Toileting assist Assist for toileting: 2 Helpers     Transfers Chair/bed transfer  Transfers assist     Chair/bed transfer assist level: 2 Helpers (squat pivot)     Locomotion Ambulation   Ambulation assist   Ambulation activity did not occur: Safety/medical concerns          Walk 10  feet activity   Assist  Walk 10 feet activity did not occur: Safety/medical concerns        Walk 50 feet activity   Assist           Walk 150 feet activity   Assist Walk 150 feet activity did not occur: Safety/medical concerns         Walk 10 feet on uneven surface  activity   Assist Walk 10 feet on uneven surfaces activity did not occur: Safety/medical concerns         Wheelchair     Assist   Type of Wheelchair: Manual    Wheelchair assist level: Dependent - Patient 0% (attempts but unable to propel with UE's)      Wheelchair 50 feet with 2 turns activity    Assist        Assist Level: Dependent - Patient 0%   Wheelchair 150 feet activity     Assist      Assist Level: Dependent - Patient 0%   Blood pressure 140/65, pulse 65, temperature (!) 97.4 F (36.3 C), resp. rate 19, height 5\' 5"  (1.651 m), weight 67.7 kg, SpO2 91 %.    Medical Problem List and Plan: 1.Left side weakness dysarthria/dysphagiasecondary to right MCA infarction due to right ICA high-grade stenosis status post angioplasty -patient may  shower -ELOS/Goals: 17 to 21 days  -Continue CIR, team conf in am  2. Antithrombotics: -DVT/anticoagulation: Per neurology stopping Xarelto-antiplatelet therapy: Aspirin81 mg daily 3. Pain Management:Tylenol as needed. Denies pain.  4. Mood:Provide emotional support -antipsychotic agents: N/A 5. Neuropsych: This patientiscapable of making decisions on hisown behalf. 6. Skin/Wound Care:Routine skin checks 7. Fluids/Electrolytes/Nutrition:Routine in and outs with follow-up chemistries, poor intake start IVF at noc  8. Atrial fibrillation/tachybradycardia syndrome/pacemaker. Cardiac rate controlled. Continue Toprol25 mg daily 9. SCC of the carotid gland. Follow-up outpatient 10. Hypertension. Toprol 25 mg daily. Monitor with increased mobility Vitals:   10/16/20 1935  10/17/20 0443  BP: (!) 174/59 140/65  Pulse: 63 65  Resp: 20 19  Temp: 97.6 F (36.4 C) (!) 97.4 F (36.3 C)  SpO2: 100% 91%  lability  11. Hypothyroidism. Synthroid 12. ?Bacteremia but neg cultures .Ancef 2 g every 8 hours and complete10-daycourse, afebrile  but has leufocytosis, see #18 13. Hyperlipidemia. Lipitor 14. Dysphagia. Dysphagia #1 thin liquids. Follow-up speech therapy 15.CKD stage III. Baseline 1.30-1.48. Follow-up chemistries- will need to be judicious with diuretics 16. Acute blood loss anemia with history of GI bleed  Patient transfused 1 unit packed red blood cells 10/12/2020. Continue iron supplement. Protonix 40 mg twice daily. Follow-up CBC-10.7 on 11/20, 8.8 on 11/22.  17. Prediabetes. Hemoglobin A1c 5.7. SSI 18. CHF:- EF relatively preserved 45-50% but with severe tricuspid regurg on ECHO Lasix 20 11/22 given tachypnea, labored breathing, UE but not LE extremity swelling, pulmonary edema and plural effusions on CXR. Cr normal. - will ask cardiology to eval   LOS: 3 days A FACE TO FACE EVALUATION WAS PERFORMED  Charlett Blake 10/17/2020, 7:38 AM

## 2020-10-17 NOTE — Progress Notes (Signed)
Physical Therapy Session Note  Patient Details  Name: Fernando [name] MRN: [id number] Date of Birth: [birth year]  Today's Date: 10/17/2020 PT Individual Time: 9518-8416 PT Individual Time Calculation (min): 56 min   and  Today's Date: 10/17/2020 PT Missed Time: 19 Minutes Missed Time Reason: Patient fatigue  Short Term Goals: Week 1:  PT Short Term Goal 1 (Week 1): Pt will be able to perform bed mobility with mod assist overall PT Short Term Goal 2 (Week 1): Pt will be able to perform bed <> chair transfer with mod assist PT Short Term Goal 3 (Week 1): Pt will be able to perform sit <> stands with mod +2  Skilled Therapeutic Interventions/Progress Updates:    Pt received asleep, sitting in w/c and upon awakening agreeable to therapy session. Pt HOH and soft spoken when replying to questions. Pt noted to have pitting edema in B UEs and weeping from L elbow region along with edema in distal B LEs with TED hose already donned.  At rest: HR 62bpm and SpO2 100% on RA with pt having normal breathing rhythm. Sit>stand w/c>stedy with +2 mod assist for lifting while using B UEs on stedy bar to assist with coming to stand - demos lack of full trunk/hip extension with mildly flexed trunk and downward gaze/cervical alignment, minimal to no correction upon cuing -  able to tolerate standing ~20seconds prior to returning to sit in w/c due to fatigue. HR noted to be elevated to 127bpm and increasing to 161bpm with SpO2 86%  [name], RN notified and present to assess pt and his HR had decreased to ~60bpm during the seated rest, which she confirmed via apical pulse. RN remained present for a portion of therapy session to aide in monitoring pt's response to activity and his HR was stable in 60s and SpO2 >94%. Pt had noted to be incontinent of bladder and bowels. Performed ~5x sit<>stands w/c<>stedy with +2 mod assist for lifting into stand using B UE support on stedy bar - facilitation for increased trunk/hip  extension for full stand - tolerated standing up to 65minute 1x otherwise stood <30seconds each with mod assist of 1 while +2 assist performed total assist LB clothing management and peri-care (RN removed external catheter). Pt required prolonged therapeutic seated rest breaks between each stand due to fatigue - did notice pt's respiratory rate varying throughout session with pt having intermittent, short episodes of tachypnea. While standing pt reports he feels "awful" but unable to elaborate further despite questioning. Stedy transfer w/c>EOB. Pt able to come to standing from high perched seat of stedy with +2 min assist. Sitting EOB doffed shoes total assist. Sit>supine with mod assist for trunk descent and B LE management onto bed. Pt left supine in bed with B UEs elevated on pillows to aid in edema management, HOB elevated, needs in reach, and bed alarm on.   Missed 19 minutes of skilled physical therapy.  Therapy Documentation Precautions:  Precautions Precautions: Fall, ICD/Pacemaker, Other (comment) Precaution Comments: L sided weakness; very HOH; dysarthria Restrictions Weight Bearing Restrictions: No  Pain: Reports posterior cervical neck pain and headache pain - RN present and aware - provided rest breaks, repositioning, and emotional support for pain management.   Therapy/Group: Individual Therapy  Tawana Scale , PT, DPT, CSRS  10/17/2020, 12:30 PM

## 2020-10-17 NOTE — Progress Notes (Signed)
Occupational Therapy Session Note  Patient Details  Name: Fernando [name] MRN: [id number] Date of Birth: Nov 07, 1927  Today's Date: 10/17/2020 OT Individual Time: 0321-2248 OT Individual Time Calculation (min): 63 min    Short Term Goals: Week 1:  OT Short Term Goal 1 (Week 1): Pt will don shirt with mod A OT Short Term Goal 2 (Week 1): Pt will maintain sitting balance EOB with CGA to complete 1 ADL OT Short Term Goal 3 (Week 1): Pt will complete self feeding with LRAD with min A  Skilled Therapeutic Interventions/Progress Updates:    Pt worked on bathing and dressing from supine to sit this session.  He began in supine with HOB elevated.  Had him work on washing front peri area with overall min assist when presented with the washcloth.  He was able to exhibit spontaneous movement in the LUE to assist with washing with overall min assist.  He was able to roll in supine with mod assist in order for therapist to clean up buttocks and donn new brief with total assist.  He transitioned to sitting with max assist for completion of UB bathing and all of dressing.  He maintained static sitting balance with supervision and completed UB bathing with overall mod assist when presented with a washcloth.  He needed max assist for donning pullover shirt after deodorant, which required min assist.  Total assist +2 (pt 25%) for donning pants sit to stand, with increased pushing to the right noted.  Therapist provided total assist for donning TEDs and gripper socks to complete dressing.  He finished session with transfer to the wheelchair via sliding board with total +2 (pt 40%).  Pt left up in wheelchair with call button and phone in reach and nursing present.  Safety alarm belt in place as well and LUE supported on pillow.  Pt's son in room throughout session and asked questions regarding clothing needs, progress, expectations for discharge ect.     Therapy Documentation Precautions:  Precautions Precautions:  Fall, ICD/Pacemaker Precaution Comments: L sided weakness; very HOH; dysarthria Restrictions Weight Bearing Restrictions: No  Pain: Pain Assessment Pain Scale: 0-10 Pain Score: 0-No pain ADL: See Care Tool Section for some details of mobility and selfcare  Therapy/Group: Individual Therapy  Garron Eline OTR/L 10/17/2020, 12:14 PM

## 2020-10-17 NOTE — Progress Notes (Signed)
Speech Language Pathology Daily Session Note  Patient Details  Name: Fernando [name] MRN: [id number] Date of Birth: [birth year]  Today's Date: 10/17/2020 SLP Individual Time: 2637-8588 SLP Individual Time Calculation (min): 44 min  Short Term Goals: Week 1: SLP Short Term Goal 1 (Week 1): Pt will consume current diet with appreciable change in respiratory status or other vitals to indicate tolerance, and Mod A cues for use of compensatory swallow stategies. SLP Short Term Goal 2 (Week 1): Pt will consume trials of Dys 2 textures demonstrating efficinent mastication and oral clearance X3 prior to upgrade. SLP Short Term Goal 3 (Week 1): Pt will use compensatory strategies to increase speech intelligibility to 50% at the word level with Max A multimodal cues. SLP Short Term Goal 4 (Week 1): Pt will recall 1 safety precaution with Mod A cues for use of strategies and/or aids. SLP Short Term Goal 5 (Week 1): Pt will demonstrate ability to problem solve basic functional and familiar situations with Mod A cues. SLP Short Term Goal 6 (Week 1): Pt will identify 1 physical and 1 cognitive-linguistic deficit in setting of acute CVA with Mod A cues.  Skilled Therapeutic Interventions: Pt was seen for skilled ST targeting swallowing goals and education with pt's son at bedside. Pt accepted sips of thin H2O with delayed coughing noted. He politely declined opportunity for puree snack and additional thin intake. Pt's son provided very thorough baseline information regarding swallowing, speech, and cognition. Per son, pt has hx of radiation tx for oral cancer and exhibited frequent coughing when eating and drinking at baseline, so much so that he stopped eating and drinking in public. He also reports pt managed his medications and finances with Supervision from son at baseline. Skilled education provided regarding current diet recommendations, strategies for safe swallowing, and MBSS results. Also discussed hearing  loss and recommendation to see an audiologist for thorough evaluation, in addition to treatment plan for cognitive-linguistic skills to increase pt's independence with ADLs prior to discharge. Pt left sitting in chair with alarm set and needs within reach, son still present. Continue per current plan of care.          Pain Pain Assessment Pain Scale: 0-10 Pain Score: 0-No pain  Therapy/Group: Individual Therapy  [name] 10/17/2020, 7:23 AM

## 2020-10-17 NOTE — H&P (Signed)
Physical Medicine and Rehabilitation Admission H&P          Chief Complaint  Patient presents with  . Shortness of Breath  . Back Pain  : HPI: Fernando [name] is a 86 year old right-handed male with history of atrial fibrillation maintained on Xarelto, hypertension, CKD stage III 1.30-1.48, history of GI bleed 2019, CAD with pacemaker, hyperlipidemia, prior left CEA, SCC of the carotid gland and recent TIA August 2021.  He quit smoking 47 years ago.  Per chart review lives with children.  He was receiving assistance for ADLs and home care.  1 level home 6 steps to entry.  Presented 10/07/2020 [facility] left-sided weakness as well as dysarthria.  Admission chemistries glucose 172 BUN 37 creatinine 1.49, lactic acid 5.0, BNP 1097, WBC 17,100, hemoglobin 9.1, hemoglobin A1c 5.7. Cranial CT scan showed hyperdense right MCA suspicious for emergent large vessel occlusion.  No acute cortically based infarct or acute intracranial hemorrhage.  Patient did not receive TPA.  Carotid Doppler with right ICA stenosis 80 - 99%.  Echocardiogram with ejection fraction of 40 to 45% without thrombus.  TEE showed akinesis of the distal septum and apex ejection fraction 50% no thrombus noted.  No vegetation.  CTA of the head and neck no emergent large vessel occlusion.  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  Delayed transit throughout the entire right cerebral hemisphere and left ACA territory related to high-grade proximal right ICA stenosis and chronically diminutive disease left ACA.  Patient underwent ICA angioplasty per interventional radiology.  He did require short-term intubation.  Hospital course fever blood cultures 2/2 G+ cocci maintained on Rocephin.  He was cleared to begin aspirin for CVA prophylaxis for right MCA infarction due to right ICA high-grade stenosis and AWAIT PLAN TO [place].    Currently on a dysphagia #1 thin liquid diet.  Blood culture 2/2 group B strep pansensitive placed  on Ancef therapy evaluations completed and patient was admitted for a comprehensive rehab program   Review of Systems  Constitutional: Positive for fever.  HENT: Negative for hearing loss.   Eyes: Negative for blurred vision and double vision.  Respiratory: Negative for cough and shortness of breath.   Cardiovascular: Positive for palpitations and leg swelling.  Gastrointestinal: Positive for constipation. Negative for heartburn, nausea and vomiting.  Genitourinary: Positive for urgency. Negative for dysuria, flank pain and hematuria.  Musculoskeletal: Positive for joint pain and myalgias.  Skin: Negative for rash.  Neurological: Positive for sensory change and weakness.  All other systems reviewed and are negative.          Past Medical History:  Diagnosis Date  . Arthritis    . Atrial fibrillation ([place])    . Carotid artery disease ([place])      Dr. Kellie Simmering - s/p left CEA  . Coronary artery disease    . Essential hypertension, benign    . Hyperlipidemia    . 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 This mass was initially felt to be 1.7 cm at the time of presentation but had enlarged significantly to 4 cm clinically at the time of surgery. Pathologically it was 3.4 cm. A lymph node deep to the parotid was enlarged clinically at the time of surgery but was negative   . Tachycardia-bradycardia syndrome ([place])               Past  Surgical History:  Procedure Laterality Date  . CAROTID ENDARTERECTOMY Left Jan. 3, 2009    CE  . CATARACT EXTRACTION W/ INTRAOCULAR LENS IMPLANT        Bilateral  . ESOPHAGOGASTRODUODENOSCOPY N/A 03/29/2018    Procedure: ESOPHAGOGASTRODUODENOSCOPY (EGD);  Surgeon: Danie Binder, MD;  [facility];  Service: Endoscopy;  Laterality: N/A;  . EYE SURGERY      . INSERT / REPLACE / REMOVE PACEMAKER      . IR  INTRAVSC STENT CERV CAROTID W/EMB-PROT MOD SED INCL ANGIO   10/06/2020  . IR US GUIDE VASC ACCESS LEFT   10/06/2020  . IR US GUIDE VASC ACCESS RIGHT   10/06/2020  . IR US GUIDE VASC ACCESS RIGHT   10/06/2020  . PACEMAKER INSERTION   10-01-2012  . PARATHYROIDECTOMY   10/28/2012  . PAROTIDECTOMY   10/28/2012    Procedure: PAROTIDECTOMY;  Surgeon: Ascencion Dike, MD;  Location: [facility];  Service: ENT;  Laterality: Right;  Total Parotidectomy  . PERMANENT PACEMAKER INSERTION N/A 10/01/2012    Procedure: PERMANENT PACEMAKER INSERTION;  Surgeon: Evans Lance, MD;  Location: [facility] CATH LAB;  Service: Cardiovascular;  Laterality: N/A;  . RADIOLOGY WITH ANESTHESIA N/A 10/07/2020    Procedure: MRI WITH ANESTHESIA;  Surgeon: Radiologist, Medication, MD;  Location: [facility];  Service: Radiology;  Laterality: N/A;  . RADIOLOGY WITH ANESTHESIA N/A 10/06/2020    Procedure: IR WITH ANESTHESIA;  Surgeon: Luanne Bras, MD;  Location: [place];  Service: Radiology;  Laterality: N/A;  . TEE WITHOUT CARDIOVERSION N/A 10/10/2020    Procedure: TRANSESOPHAGEAL ECHOCARDIOGRAM (TEE);  Surgeon: Lelon Perla, MD;  Location: [facility] ENDOSCOPY;  Service: Cardiovascular;  Laterality: N/A;             Family History  Problem Relation Age of Onset  . Heart disease Sister    . Heart attack Sister    . Cirrhosis Mother    . Cancer Brother          brain cancer  . Cancer Sister          stomach cancer    Social History:  reports that he quit smoking about 47 years ago. His smoking use included cigarettes. He has never used smokeless tobacco. He reports current alcohol use of about 1.0 standard drink of alcohol per week. He reports that he does not use drugs. Allergies: No Known Allergies            Medications Prior to Admission  Medication Sig Dispense Refill  . acetaminophen (TYLENOL) 500 MG tablet Take 1,000 mg by mouth every 6 (six) hours as needed for mild pain.      Marland Kitchen atorvastatin (LIPITOR) 40 MG tablet Take 40 mg by  mouth every morning.       Marland Kitchen levothyroxine (SYNTHROID) 50 MCG tablet Take 1 tablet (50 mcg total) by mouth daily before breakfast. 30 tablet 3  . metoprolol tartrate (LOPRESSOR) 25 MG tablet TAKE 1 TABLET TWICE A DAY (Patient taking differently: Take 25 mg by mouth 2 (two) times daily. ) 180 tablet 0  . pantoprazole (PROTONIX) 40 MG tablet Take 1 tablet (40 mg total) by mouth 2 (two) times daily before a meal. Twice a day for 3 months and then start taking daily. (Patient taking differently: Take 40 mg by mouth 2 (two) times daily before a meal. ) 60 tablet 3  . XARELTO 15 MG TABS tablet TAKE 1 TABLET BY MOUTH ONCE DAILY WITH SUPPER (Patient  taking differently: Take 15 mg by mouth daily with supper. ) 90 tablet 3  . ondansetron (ZOFRAN ODT) 4 MG disintegrating tablet 4mg  ODT q4 hours prn nausea/vomit (Patient not taking: Reported on 10/09/2020) 12 tablet 0      Drug Regimen Review Drug regimen was reviewed and remains appropriate with no significant issues identified   Home: Home Living Family/patient expects to be discharged to:: Private residence Living Arrangements: Children Available Help at Discharge: Family Type of Home: House Home Access: Stairs to enter Technical brewer of Steps: 5 Entrance Stairs-Rails: Right, Left Home Layout: One level Bathroom Shower/Tub: Chiropodist: Handicapped height Bathroom Accessibility: Yes Home Equipment: Environmental consultant - 2 wheels, [place] - single point, Bedside commode, Shower seat - built in Additional Comments: information from previous charting, pt unable to state clear answers to history questions or consistently answer yes/no. No family present at eval  Lives With: Son   Functional History: Prior Function Level of Independence: Needs assistance Comments: Per neurology note, pt received assist for ADLs and home care such as cooking, but pt did not need assist for mobility. Pt gave inconsistent answers during history in session    Functional Status:  Mobility: Bed Mobility Overal bed mobility: Needs Assistance Bed Mobility: Supine to Sit Rolling: Mod assist Sidelying to sit: Mod assist, +2 for physical assistance Supine to sit: HOB elevated, Mod assist General bed mobility comments: Pt requiring assist for L LE to EOB and to lift trunk; good initiation with lifting trunk but did requiring mod A and mod A to scoot forward; required cues for sequencing Transfers Overall transfer level: Needs assistance Equipment used: 1 person hand held assist, 2 person hand held assist (back of chari) Transfers: Sit to/from Stand, Risk manager Sit to Stand: Max assist, +2 physical assistance, From elevated surface Stand pivot transfers: Max assist, +2 physical assistance General transfer comment: 3 x sit to stand from elevated bed with max A of 2 to complete. Pt with mod A initially but required max x 2 to come upright and still with difficulty tucking buttock.  Max x 2 pivot to chair toward L side Ambulation/Gait Ambulation/Gait assistance: Max assist, +2 physical assistance Gait Distance (Feet): 3 Feet Assistive device: Rolling walker (2 wheeled) Gait Pattern/deviations: Step-to pattern, Decreased stride length, Shuffle, Leaning posteriorly, Trunk flexed General Gait Details: NT - DOE 2/4, LE buckling Gait velocity interpretation: <1.31 ft/sec, indicative of household ambulator   ADL: ADL Overall ADL's : Needs assistance/impaired Eating/Feeding: Supervision/ safety, Set up, Minimal assistance, Sitting Eating/Feeding Details (indicate cue type and reason): light MIN A to facilitate AROM of LUE Grooming: Minimal assistance, Sitting Upper Body Bathing: Moderate assistance, Sitting Lower Body Bathing: Maximal assistance, +2 for physical assistance, +2 for safety/equipment, Sit to/from stand Upper Body Dressing : Moderate assistance, Sitting Lower Body Dressing: Maximal assistance, Sitting/lateral leans, Sit to/from  stand Toilet Transfer: Maximal assistance, +2 for physical assistance, +2 for safety/equipment Toilet Transfer Details (indicate cue type and reason): simulated via functional mobility MAX A +2 to pivot to recliner Toileting- Clothing Manipulation and Hygiene: Total assistance, Sit to/from stand Toileting - Clothing Manipulation Details (indicate cue type and reason): for posterior pericare in standing Functional mobility during ADLs: Maximal assistance, +2 for physical assistance (stand pivot transfer) General ADL Comments: pt continues to present with decreased activity tolerance, generalized weakness decreased functional ROM of LUE   Cognition: Cognition Overall Cognitive Status: Impaired/Different from baseline Arousal/Alertness: Lethargic Orientation Level: Oriented to person, Oriented to place Safety/Judgment: Impaired Cognition  Arousal/Alertness: Awake/alert Behavior During Therapy: WFL for tasks assessed/performed Overall Cognitive Status: Impaired/Different from baseline Area of Impairment: Following commands, Safety/judgement, Problem solving, Attention Orientation Level: Disoriented to, Situation, Time Current Attention Level: Selective Following Commands: Follows one step commands with increased time Safety/Judgement: Decreased awareness of safety, Decreased awareness of deficits Problem Solving: Slow processing, Requires verbal cues, Requires tactile cues, Difficulty sequencing General Comments: Pt is HOH and sometiimes difficult to assist.  Granddaughter present and reports he is seeming closer to baaseline.  Does require increased time and multimodal cueing Difficult to assess due to: Hard of hearing/deaf   Physical Exam: Blood pressure 107/68, pulse 63, temperature 98 F (36.7 C), temperature source Oral, resp. rate 17, height 5\' 5"  (1.651 m), weight 72.3 kg, SpO2 95 %. Physical Exam Neurological:     Comments: Patient is alert and severely dysarthric.  Makes eye  contact with examiner.  Oriented to self and age but not year or month.  Follows simple commands     General: No acute distress Mood and affect are appropriate Heart: Regular rate and rhythm no rubs murmurs or extra sounds Lungs: Clear to auscultation, breathing unlabored, no rales or wheezes Abdomen: Positive bowel sounds, soft nontender to palpation, nondistended Extremities: No clubbing, cyanosis, or edema, except for left dorsum of hand Skin: No evidence of breakdown, no evidence of rash Neurologic: Cranial nerves II through XII intact, motor strength is 5/5 in right deltoid, bicep, tricep, grip, hip flexor, knee extensors, ankle dorsiflexor and plantar flexor To minus left deltoid biceps triceps finger flexors 0 at the finger extensors 3 - left hip flexor knee extensor ankle dorsiflexor Sensory exam normal sensation to light touch  in bilateral upper and lower extremities Cerebellar exam normal finger to nose to finger bilaterally musculoskeletal: Full range of motion in all 4 extremities. No joint swelling     Lab Results Last 48 Hours             Results for orders placed or performed during the hospital encounter of 10/06/20 (from the past 48 hour(s))  Glucose, capillary     Status: None    Collection Time: 10/11/20  8:44 AM  Result Value Ref Range    Glucose-Capillary 92 70 - 99 mg/dL      Comment: Glucose reference range applies only to samples taken after fasting for at least 8 hours.  Glucose, capillary     Status: Abnormal    Collection Time: 10/11/20 11:46 AM  Result Value Ref Range    Glucose-Capillary 123 (H) 70 - 99 mg/dL      Comment: Glucose reference range applies only to samples taken after fasting for at least 8 hours.  Glucose, capillary     Status: None    Collection Time: 10/11/20  5:35 PM  Result Value Ref Range    Glucose-Capillary 95 70 - 99 mg/dL      Comment: Glucose reference range applies only to samples taken after fasting for at least 8 hours.  CBC      Status: Abnormal    Collection Time: 10/11/20  6:46 PM  Result Value Ref Range    WBC 8.9 4.0 - 10.5 K/uL    RBC 4.25 4.22 - 5.81 MIL/uL    Hemoglobin 9.8 (L) 13.0 - 17.0 g/dL    HCT 32.2 (L) 39 - 52 %    MCV 75.8 (L) 80.0 - 100.0 fL    MCH 23.1 (L) 26.0 - 34.0 pg    MCHC 30.4 30.0 -  36.0 g/dL    RDW 18.7 (H) 11.5 - 15.5 %    Platelets 217 150 - 400 K/uL    nRBC 1.5 (H) 0.0 - 0.2 %      Comment: Performed at [facility] [address] [place], [place] [address]  Glucose, capillary     Status: Abnormal    Collection Time: 10/11/20  9:00 PM  Result Value Ref Range    Glucose-Capillary 104 (H) 70 - 99 mg/dL      Comment: Glucose reference range applies only to samples taken after fasting for at least 8 hours.  Glucose, capillary     Status: Abnormal    Collection Time: 10/11/20 11:38 PM  Result Value Ref Range    Glucose-Capillary 112 (H) 70 - 99 mg/dL      Comment: Glucose reference range applies only to samples taken after fasting for at least 8 hours.  Glucose, capillary     Status: Abnormal    Collection Time: 10/12/20  4:28 AM  Result Value Ref Range    Glucose-Capillary 105 (H) 70 - 99 mg/dL      Comment: Glucose reference range applies only to samples taken after fasting for at least 8 hours.  Glucose, capillary     Status: Abnormal    Collection Time: 10/12/20  8:16 AM  Result Value Ref Range    Glucose-Capillary 106 (H) 70 - 99 mg/dL      Comment: Glucose reference range applies only to samples taken after fasting for at least 8 hours.  CBC     Status: Abnormal    Collection Time: 10/12/20  8:55 AM  Result Value Ref Range    WBC 9.8 4.0 - 10.5 K/uL    RBC 3.25 (L) 4.22 - 5.81 MIL/uL    Hemoglobin 7.4 (L) 13.0 - 17.0 g/dL      Comment: Reticulocyte Hemoglobin testing may be clinically indicated, consider ordering this additional test [id number]      HCT 24.6 (L) 39 - 52 %    MCV 75.7 (L) 80.0 - 100.0 fL    MCH 22.8 (L) 26.0 - 34.0 pg    MCHC 30.1 30.0 -  36.0 g/dL    RDW 19.2 (H) 11.5 - 15.5 %    Platelets 229 150 - 400 K/uL    nRBC 0.8 (H) 0.0 - 0.2 %      Comment: Performed at [facility], [place] [address], [place], [place] [address]  Basic metabolic panel     Status: Abnormal    Collection Time: 10/12/20  8:55 AM  Result Value Ref Range    Sodium 146 (H) 135 - 145 mmol/L    Potassium 3.7 3.5 - 5.1 mmol/L    Chloride 118 (H) 98 - 111 mmol/L    CO2 20 (L) 22 - 32 mmol/L    Glucose, Bld 120 (H) 70 - 99 mg/dL      Comment: Glucose reference range applies only to samples taken after fasting for at least 8 hours.    BUN 30 (H) 8 - 23 mg/dL    Creatinine, Ser 1.05 0.61 - 1.24 mg/dL    Calcium 8.1 (L) 8.9 - 10.3 mg/dL    GFR, Estimated >60 >60 mL/min      Comment: (NOTE) Calculated using the CKD-EPI Creatinine Equation (2021)      Anion gap 8 5 - 15      Comment: Performed at [facility] [place],  [place] [address]  Type and screen [place]     Status: None (Preliminary result)    Collection Time: 10/12/20 10:42 AM  Result Value Ref Range    ABO/RH(D) A POS      Antibody Screen NEG      Sample Expiration 10/15/2020,2359      Unit Number [id number]      Blood Component Type RED CELLS,LR      Unit division 00      Status of Unit ISSUED      Transfusion Status OK TO TRANSFUSE      Crossmatch Result          Compatible Performed at [facility], [place] [address], [place], [place] [address]    Glucose, capillary     Status: Abnormal    Collection Time: 10/12/20 12:22 PM  Result Value Ref Range    Glucose-Capillary 116 (H) 70 - 99 mg/dL      Comment: Glucose reference range applies only to samples taken after fasting for at least 8 hours.  Prepare RBC (crossmatch)     Status: None    Collection Time: 10/12/20 12:53 PM  Result Value Ref Range    Order Confirmation          ORDER PROCESSED BY BLOOD BANK Performed at [facility], [address], [place], [place]  [address]    Glucose, capillary     Status: Abnormal    Collection Time: 10/12/20  3:54 PM  Result Value Ref Range    Glucose-Capillary 105 (H) 70 - 99 mg/dL      Comment: Glucose reference range applies only to samples taken after fasting for at least 8 hours.  Occult blood card to lab, stool     Status: Abnormal    Collection Time: 10/12/20  5:50 PM  Result Value Ref Range    Fecal Occult Bld POSITIVE (A) NEGATIVE      Comment: Performed at [facility], [address] [place], [place] [address]  Hemoglobin and hematocrit, blood     Status: Abnormal    Collection Time: 10/12/20  6:17 PM  Result Value Ref Range    Hemoglobin 9.2 (L) 13.0 - 17.0 g/dL    HCT 29.4 (L) 39 - 52 %      Comment: Performed at [facility] [address] [place], [place] [address]  Glucose, capillary     Status: Abnormal    Collection Time: 10/12/20  8:32 PM  Result Value Ref Range    Glucose-Capillary 127 (H) 70 - 99 mg/dL      Comment: Glucose reference range applies only to samples taken after fasting for at least 8 hours.  Glucose, capillary     Status: Abnormal    Collection Time: 10/12/20 11:51 PM  Result Value Ref Range    Glucose-Capillary 117 (H) 70 - 99 mg/dL      Comment: Glucose reference range applies only to samples taken after fasting for at least 8 hours.  CBC     Status: Abnormal    Collection Time: 10/13/20  2:46 AM  Result Value Ref Range    WBC 10.7 (H) 4.0 - 10.5 K/uL    RBC 3.81 (L) 4.22 - 5.81 MIL/uL    Hemoglobin 9.2 (L) 13.0 - 17.0 g/dL    HCT 29.2 (L) 39 - 52 %    MCV 76.6 (L) 80.0 - 100.0 fL    MCH 24.1 (L) 26.0 -  34.0 pg    MCHC 31.5 30.0 - 36.0 g/dL    RDW 19.7 (H) 11.5 - 15.5 %    Platelets 248 150 - 400 K/uL    nRBC 0.8 (H) 0.0 - 0.2 %      Comment: Performed at [facility] [address] [place], [place] [address]  Basic metabolic panel     Status: Abnormal    Collection Time: 10/13/20  2:46 AM  Result Value Ref Range    Sodium 145 135 - 145  mmol/L    Potassium 3.3 (L) 3.5 - 5.1 mmol/L    Chloride 114 (H) 98 - 111 mmol/L    CO2 22 22 - 32 mmol/L    Glucose, Bld 115 (H) 70 - 99 mg/dL      Comment: Glucose reference range applies only to samples taken after fasting for at least 8 hours.    BUN 30 (H) 8 - 23 mg/dL    Creatinine, Ser 1.11 0.61 - 1.24 mg/dL    Calcium 7.9 (L) 8.9 - 10.3 mg/dL    GFR, Estimated >60 >60 mL/min      Comment: (NOTE) Calculated using the CKD-EPI Creatinine Equation (2021)      Anion gap 9 5 - 15      Comment: Performed at [facility] [address], [place], [place] [address]  Glucose, capillary     Status: Abnormal    Collection Time: 10/13/20  4:13 AM  Result Value Ref Range    Glucose-Capillary 116 (H) 70 - 99 mg/dL      Comment: Glucose reference range applies only to samples taken after fasting for at least 8 hours.      Imaging Results (Last 48 hours)  No results found.            Medical Problem List and Plan: 1.  Left side weakness dysarthria/dysphagia secondary to right MCA infarction due to right ICA high-grade stenosis status post angioplasty             -patient may  shower             -ELOS/Goals: 17 to 21 days 2.  Antithrombotics: -DVT/anticoagulation: Per neurology stopping Xarelto            -antiplatelet therapy: Aspirin 81 mg daily 3. Pain Management: Tylenol as needed 4. Mood: Provide emotional support             -antipsychotic agents: N/A 5. Neuropsych: This patient is capable of making decisions on his own behalf. 6. Skin/Wound Care: Routine skin checks 7. Fluids/Electrolytes/Nutrition: Routine in and outs with follow-up chemistries 8.  Atrial fibrillation/tachybradycardia syndrome/pacemaker.  Cardiac rate controlled.  Continue Toprol 25 mg daily 9.  SCC of the carotid gland.  Follow-up outpatient 10.  Hypertension.  Toprol 25 mg daily.  Monitor with increased mobility 11.  Hypothyroidism.  Synthroid 12.  Bacteremia.  Ancef 2 g every 8 hours and complete  10-day course 13.  Hyperlipidemia.  Lipitor 14.  Dysphagia.  Dysphagia #1 thin liquids.  Follow-up speech therapy 15.CKD stage III.  Baseline 1.30-1.48.  Follow-up chemistries 16.  Acute blood loss anemia with history of GI bleed 2019.  Patient transfused 1 unit packed red blood cells 10/12/2020.  Continue iron supplement.  Protonix 40 mg twice daily.  Follow-up CBC-still pending from this morning, will transfuse if less than 7 g 17.  Prediabetes.  Hemoglobin A1c 5.7.  SSI.     Lavon Paganini Angiulli, PA-C 10/13/2020 "  I have personally performed a face to face diagnostic evaluation of this patient.  Additionally, I have reviewed and concur with the physician assistant's documentation above." Charlett Blake M.D.  Medical Group [id number] (Neuromuscular Med) Diplomate Am Board of Electrodiagnostic Med Fellow Am Board of Interventional Pain

## 2020-10-17 NOTE — Progress Notes (Signed)
Physical Therapy Session Note  Patient Details  Name: Fernando [name] MRN: [id number] Date of Birth: [birth year]  Today's Date: 10/17/2020 PT Individual Time: 1015-1030 PT Individual Time Calculation (min): 15 min   MAKE UP TIME  Short Term Goals: Week 1:  PT Short Term Goal 1 (Week 1): Pt will be able to perform bed mobility with mod assist overall PT Short Term Goal 2 (Week 1): Pt will be able to perform bed <> chair transfer with mod assist PT Short Term Goal 3 (Week 1): Pt will be able to perform sit <> stands with mod +2  Skilled Therapeutic Interventions/Progress Updates:    Patient received sitting up in wc, son in room, agreeable to PT session. He denies pain, but endorses fatigue. PT propelling patient in wc to therapy gym for time management and energy conservation. He participated in the following therex seated: LAQ, 2x10, marches 2x10, ankle pumps 2x20, hamstring curls 2x10, isometric hip abd/add 2x10 each. Patient returning to room in wc, seatbelt alarm on, call light within reach, son at bedside.   Therapy Documentation Precautions:  Precautions Precautions: Fall, ICD/Pacemaker Precaution Comments: L sided weakness; very HOH; dysarthria Restrictions Weight Bearing Restrictions: No    Therapy/Group: Individual Therapy  [name], PT, DPT, CBIS  10/17/2020, 10:37 AM

## 2020-10-17 NOTE — Progress Notes (Signed)
Progress Note  Patient Name: Fernando [name] Date of Encounter: 10/17/2020  Primary Cardiologist:   No primary care provider on file.   Subjective   Yesterday he was noted to have labored breathing.  Today he reports that he is breathing well.  He denies dyspnea or pain.   Inpatient Medications    Scheduled Meds: . aspirin EC  81 mg Oral Daily  . atorvastatin  20 mg Oral Daily  . chlorhexidine  15 mL Mouth Rinse BID  . enoxaparin (LOVENOX) injection  40 mg Subcutaneous Q24H  . ferrous sulfate  325 mg Oral TID WC  . levothyroxine  50 mcg Oral Q0600  . mouth rinse  15 mL Mouth Rinse q12n4p  . metoprolol succinate  25 mg Oral Daily  . pantoprazole sodium  40 mg Oral BID   Continuous Infusions:  PRN Meds: acetaminophen **OR** acetaminophen, cloNIDine, loperamide   Vital Signs    Vitals:   10/17/20 0443 10/17/20 0500 10/17/20 0951 10/17/20 1340  BP: 140/65   (!) 136/59  Pulse: 65  69 60  Resp: 19   18  Temp: (!) 97.4 F (36.3 C)   97.9 F (36.6 C)  TempSrc:      SpO2: 91%     Weight:  67.7 kg    Height:        Intake/Output Summary (Last 24 hours) at 10/17/2020 1733 Last data filed at 10/17/2020 1241 Gross per 24 hour  Intake 530 ml  Output 1000 ml  Net -470 ml   Filed Weights   10/15/20 0300 10/16/20 0427 10/17/20 0500  Weight: 70.9 kg 66 kg 67.7 kg    Telemetry    NA - Personally Reviewed  ECG    NA - Personally Reviewed  Physical Exam   GEN: No acute distress.   Neck: No  JVD Cardiac: Irregular RR, no murmurs, rubs, or gallops.  Respiratory: Clear to auscultation bilaterally. GI: Soft, nontender, non-distended  MS: No edema; No deformity. Neuro:  Nonfocal  Psych: Normal affect   Labs    Chemistry Recent Labs  Lab 10/13/20 0246 10/14/20 0327 10/16/20 0836  NA 145 143 140  K 3.3* 3.9 3.6  CL 114* 112* 109  CO2 22 20* 20*  GLUCOSE 115* 105* 98  BUN 30* 26* 26*  CREATININE 1.11 1.03 1.01  CALCIUM 7.9* 7.6* 7.9*  PROT  --   --   5.4*  ALBUMIN  --   --  2.1*  AST  --   --  18  ALT  --   --  5  ALKPHOS  --   --  65  BILITOT  --   --  1.2  GFRNONAA >60 >60 >60  ANIONGAP 9 11 11      Hematology Recent Labs  Lab 10/13/20 0246 10/14/20 1756 10/16/20 0836  WBC 10.7* 14.8* 12.7*  RBC 3.81* 4.19* 3.54*  HGB 9.2* 10.0* 8.8*  HCT 29.2* 32.8* 28.2*  MCV 76.6* 78.3* 79.7*  MCH 24.1* 23.9* 24.9*  MCHC 31.5 30.5 31.2  RDW 19.7* 21.4* 22.1*  PLT 248 325 292    Cardiac EnzymesNo results for input(s): TROPONINI in the last 168 hours. No results for input(s): TROPIPOC in the last 168 hours.   BNPNo results for input(s): BNP, PROBNP in the last 168 hours.   DDimer No results for input(s): DDIMER in the last 168 hours.   Radiology    No results found.  Cardiac Studies   Echo 10/07/20 . Akinesis of the distal  septum and apex; overall mildly reduced LV  function; no thrombus noted using definity.  2. Left ventricular ejection fraction, by estimation, is 40 to 45%. The  left ventricle has mildly decreased function. The left ventricle  demonstrates regional wall motion abnormalities (see scoring  diagram/findings for description). Left ventricular  diastolic function could not be evaluated.  3. Right ventricular systolic function is normal. The right ventricular  size is normal.  4. Left atrial size was severely dilated.  5. Right atrial size was severely dilated.  6. The mitral valve is normal in structure. Mild mitral valve  regurgitation. No evidence of mitral stenosis.  7. Tricuspid valve regurgitation is moderate.  8. The aortic valve is tricuspid. Aortic valve regurgitation is mild.  Mild aortic valve stenosis.  9. The inferior vena cava is normal in size with greater than 50%  respiratory variability, suggesting right atrial pressure of 3 mmHg.    Patient Profile     86 y.o. male with pmh of tachy-brady syndrome s/p pacemaker, permanent aib, carotid stenosis s/p L CEA and high grade  proximal right carotid stenosis, recent TIA with left hemiparehsis and expressive aphasia in August 2021, dyslipidemia, HTN, osteoarthritis, remote squamous cell carcinoma  who is being seen for the evaluation of stroke and bacteremia  Assessment & Plan    Sepsis/strep agalactiae bactermia:   No evidence of endocarditis.  Completed a course of antibiotics.    Right MCA stroke:   CAS.  On ASA.    Permanent atrial fib:   Rate was controlled and was on Xarelto.   This was initially held and then resumed on 11/17 but held again at the time of discharge secondary to GI bleed requiring PRBC.  The plan was to consider resuming this in 4 to 5 days.  So, will continue to hold at this time.     Hgb has continued to fall.  This will need to be stable before trialing DOAC again.      Tachy brady status post pacemaker:   Rate is controlled.  No DOAC as above.   Cardiomyopathy:   New.  No plan for ischemia work up.   We are called back to see the patient because of increased volume.   Net negative 1300 cc since transfer to rehab.    Today he seems to be euvolemic.  No diuresis today.  I will follow up in the AM to reassess and consider a standing diuretic dose.    For questions or updates, please contact [place] Please consult [email or web address] for contact info under Cardiology/STEMI.   Signed, Minus Breeding, MD  10/17/2020, 5:33 PM

## 2020-10-18 ENCOUNTER — Inpatient Hospital Stay (HOSPITAL_COMMUNITY): Payer: PPO

## 2020-10-18 ENCOUNTER — Inpatient Hospital Stay (HOSPITAL_COMMUNITY): Payer: PPO | Admitting: Occupational Therapy

## 2020-10-18 LAB — GLUCOSE, CAPILLARY
Glucose-Capillary: 106 mg/dL — ABNORMAL HIGH (ref 70–99)
Glucose-Capillary: 94 mg/dL (ref 70–99)
Glucose-Capillary: 127 mg/dL — ABNORMAL HIGH (ref 70–99)
Glucose-Capillary: 129 mg/dL — ABNORMAL HIGH (ref 70–99)

## 2020-10-18 MED ORDER — FUROSEMIDE 20 MG PO TABS
20.0000 mg | ORAL_TABLET | Freq: Once | ORAL | Status: AC
  Administered 2020-10-18: 20 mg via ORAL
  Filled 2020-10-18: qty 1

## 2020-10-18 NOTE — Progress Notes (Signed)
Patient ID: Fernando [name], male   DOB: 1927-10-04, 86 y.o.   MRN: [id number]   SW met with patient son. Son has patient hearing aids, aids did not come with charger port. So will return hearing aids between today and tomorrow.   [place], [place]

## 2020-10-18 NOTE — Patient Care Conference (Signed)
Inpatient RehabilitationTeam Conference and Plan of Care Update Date: 10/18/2020   Time: 10:41 AM    Patient Name: Fernando [name]      Medical Record Number: [id number]  Date of Birth: [birth year] Sex: Male         Room/Bed: 4W17C/4W17C-01 Payor Info: Payor: [employer] / Plan: [employer] PPO / Product Type: *No Product type* /    Admit Date/Time:  10/14/2020  4:15 PM  Primary Diagnosis:  <principal problem not specified>  Hospital Problems: Active Problems:   Right middle cerebral artery stroke [facility])    Expected Discharge Date: Expected Discharge Date: 11/09/20  Team Members Present: Physician leading conference: Dr. Leeroy Cha Care Coodinator Present: Dorien Chihuahua, RN, BSN, CRRN;Christina Sampson Goon, [place] Nurse Present: Other (comment) Demetrios Loll, RN) PT Present: Estevan Ryder, PT OT Present: Clyda Greener, OT SLP Present: Nadara Mode, SLP PPS Coordinator present : Gunnar Fusi, Novella Olive, PT     Current Status/Progress Goal Weekly Team Focus  Bowel/Bladder   Incontinent x2; Last BM 10/17/20  Become continent x2; regular bowel movements  Assess q shift and prn   Swallow/Nutrition/ Hydration   Dys 1, thin liquids, frequent coughing (some of this may be baseline, family reports frequent coughing with POs at home)  Supervision  carryover swallow strategies, tolerance current diet, working towrd advanced solid textures   ADL's   Min assist for UB bathing with max assist for UB dressing.  LB bathing at max assist sit to supine.  LB dressing total assist +2 (pt 25%) sit to stand.  He continues with decreased BUE strength with LUE being slightly weaker as well as increased edema.  Total assist +2 for sliding board transfers to the bed and wheelchair.  min to mod assist  selfcare retraining, DME education, neuromuscular re-education, balance retraining, family/pt education   Mobility   mod/max assist supine<>sit, +2 mod assist sit<>stand in  stedy, +2 max assist slide board transfers vs dependent stedy transfers bed<>chair, unable to advance to gait training, significantly impaired activity tolerance  min assist overall at short distance household ambulatory level  activity tolerance, transfer training, B LE strengthening, L UE NMR, sitting balance, standing tolerance, pt education   Communication   Hard of hearing (awaiting hearing aids  - family to bring), Mod A use of intelligibility strategies  Min  carryover speech intelligibilty strategies   Safety/Cognition/ Behavioral Observations  Mod-Max A  Min A  basic familiar problem solving, recall with aids/strategies, attention, awareness   Pain   None reported during shift  Pain <3/10  Assess as needed and q shift   Skin   Bruising arms, legs, abdomen, MASD to sacral buttocks area  Prevent further breakdown, provide skincare  Assess q shift and prn     Discharge Planning:  D/C home with son (also dtr, niece and nephew) 1 level home, 5 steps to enter   Team Discussion: Dyspnea noted; cardiology MD following closely. Hgb trending down and leukocytosis improved. CXR noted pulmonary edema, pleural effusion and no Pna per MD. Continue to note edema of extremities; diuretic ordered per MD; monitor effectiveness of medication and need for additional doses. Progress impaired by fatigue, poor endurance, flexed posture and need for frequent rest breaks. Patient is incontinent of bowels.  Patient on target to meet rehab goals: yes, min - mod assist goals set. Currently min - mod assist for upper body bathing and dressing and total assist +2 for lower body. Slideboard transfers; mod assist +2 for stedy and max assist  for self feeding.  *See Care Plan and progress notes for long and short-term goals.   Revisions to Treatment Plan:  Modify therapy schedule to 15/7 Trial to advance to solid textured foods  Teaching Needs: Transfers, toileting, medications, etc.   Current Barriers to  Discharge: Incontinence and Would benefit from hearing aides  Possible Resolutions to Barriers:  Family education    Medical Summary Current Status: Currently dyspneic, significant edema in extremities, has been satting 90-100%, HR is currently well controlled, leukocytosis is trending downward  Barriers to Discharge: Medical stability;Wound care  Barriers to Discharge Comments: Currently dyspneic, significant edema in extremities, in active afib, recently transferred back from cardiology, pressure injury, leukocytosis Possible Resolutions to [employer] Focus: Appreciate cardiology follow-up- may start daily Lasix, I have given 20mg  to help with dyspnea today and can increase dose if needed, continue to monitor HR TID   Continued Need for Acute Rehabilitation Level of Care: The patient requires daily medical management by a physician with specialized training in physical medicine and rehabilitation for the following reasons: Direction of a multidisciplinary physical rehabilitation program to maximize functional independence : Yes Medical management of patient stability for increased activity during participation in an intensive rehabilitation regime.: Yes Analysis of laboratory values and/or radiology reports with any subsequent need for medication adjustment and/or medical intervention. : Yes   I attest that I was present, lead the team conference, and concur with the assessment and plan of the team.   Dorien Chihuahua B 10/18/2020, 1:39 PM

## 2020-10-18 NOTE — Progress Notes (Signed)
Patient ID: Fernando [name], male   DOB: October 15, 1927, 86 y.o.   MRN: [id number] Team Conference Report to Patient/Family  Team Conference discussion was reviewed with the patient and caregiver, including goals, any changes in plan of care and target discharge date.  Patient and caregiver express understanding and are in agreement.  The patient has a target discharge date of 11/09/20.  Dyanne Iha 10/18/2020, 1:15 PM

## 2020-10-18 NOTE — Progress Notes (Signed)
Physical Therapy Session Note  Patient Details  Name: Fernando [name] MRN: [id number] Date of Birth: 1927-07-25  Today's Date: 10/18/2020 PT Individual Time: 1000-1030 PT Individual Time Calculation (min): 30 min and 41 mins  Short Term Goals: Week 1:  PT Short Term Goal 1 (Week 1): Pt will be able to perform bed mobility with mod assist overall PT Short Term Goal 2 (Week 1): Pt will be able to perform bed <> chair transfer with mod assist PT Short Term Goal 3 (Week 1): Pt will be able to perform sit <> stands with mod +2  Skilled Therapeutic Interventions/Progress Updates:    Session 1: Patient received sitting up in wc, agreeable to PT. He denies pain, but endorses fatigue. PT propelling patient in wc to therapy gym for time management and energy conservation. Attempted sit <> stand from wc with MaxA x2. However, he maintains significantly flexed posture with pushing backwards. When attempting sit <>stand with Stedy, patient requires ModA x2 and is able to achieve mostly upright posture. Patient likely kyphotic pre-morbid. Patient able to complete sit <> stand in [place] x3 with prolonged rest breaks due to fatigue. O2 >95% throughout and with HR 63-88BPM. Mild dyspnea noted that resolved within a minute. Patient transferring to therapy mat via Stedy. Static sitting balance x5 mins with noted poor endurance.  L posterior lean progressing with increased fatigue. Patient attempting sit <> stand from mat with MaxA x2, unable to achieve full upright. Patient transferred back to wc via Stedy and back to room. Seatbelt alarm on, B UE elevated on pillow/table, call light within reach.    Session 2: Patient received sitting up in wc, eating lunch with NT and son present, agreeable to PT. He denies fatigue. NT making PT aware that patient is soiled. He was able to transfer to bed via slideboard with MaxA x2. Noted significant L lateral lean making transfer more difficult. Patient is able to apply weight  through B LE to eventually begin to assist with this transfer. MaxA sit > supine transition and TotalA doffing pants supine in bed. Patient able to bridge with Max verbal cues and manual facilitation of B LE placement. Patient found to have been incontinent of both bowel and bladder. Significant redness and maceration to perineal skin observed. Protective sacral bandage changed and reapplied as it was soiled with stool. In sidelying, patient able to communicate to PT that he felt like he was going to have another bowel movement. Unable to get onto bedpan in time. TotalA for pericare/hygiene at bed level. MaxA to don pants supine in bed, MaxA x2 slideboard to wc. Seatbelt alarm on, call light within reach, B UE elevated on table.     Therapy Documentation Precautions:  Precautions Precautions: Fall, ICD/Pacemaker, Other (comment) Precaution Comments: L sided weakness; very HOH; dysarthria Restrictions Weight Bearing Restrictions: No    Therapy/Group: Individual Therapy  [name], PT, DPT, CBIS  10/18/2020, 7:37 AM

## 2020-10-18 NOTE — Progress Notes (Addendum)
Occupational Therapy Session Note  Patient Details  Name: Fernando [name] MRN: [id number] Date of Birth: [birth year]  Today's Date: 10/18/2020 OT Individual Time:  -       Short Term Goals: Week 1:  OT Short Term Goal 1 (Week 1): Pt will don shirt with mod A OT Short Term Goal 2 (Week 1): Pt will maintain sitting balance EOB with CGA to complete 1 ADL OT Short Term Goal 3 (Week 1): Pt will complete self feeding with LRAD with min A  Skilled Therapeutic Interventions/Progress Updates:  Pt received sleeping supine in bed with bed alarm engaged. Session focused on self-feeding and self-care activities. Pt continues to present with significant B UE edema. Changed brief at bed level as pt had BM. Pt cont to assist with roll right to left in bed with mod A + tactile VC for initiation of hand placement. Pt able to hold himself well in side lying position with use of bed rail, however is total A for pericare hygeine and donning new brief + pants. Completed sup to sit EOB transfer with max A, req +2 total assist (pt 30%) to complete slide board transfer from EOB to w/c. Maintaining static sitting balance at EOB with standby A d/t decreased functional strength. Cont to demonstrated labored breathing with and after activity throughout session, O2 saturation reading 90% on room air. Ate breakfast in w/c, improving from max to min A  for self-feeding throughout session. Able to grasp spoon with red foam tubing (placed to facilitate functional grasp) and bring to mouth with occasional min A to support spoon using the dominant LUE.  He takes sips from cup with lid/straw with bilat hands with min A for lifting cup to mouth. Req consistent VCs to take small sips of and to alternate sips with food, pt exhibiting consistent coughing throughout meal. Pt finally washed face with set-up A at sink and rinsed mouth with water with max A to bring cup to lips and for controlled water flow. Pt left in w/c with B UE supported on  pillows to decrease edema and facilitate supportive positioning. Additionally left with adapted call bell in reach and all immediate needs met.      Therapy Documentation Precautions:  Precautions Precautions: ICD/Pacemaker, Fall Precaution Comments: L sided weakness; very HOH; dysarthria Restrictions Weight Bearing Restrictions: No  Vital Signs: BP: 186/64 (99) supine in bed at start of session O2 sat: 99% after activity  HR: 63 bpm supine in bed at start of session  Pain: Pain Assessment Pain Scale: 0-10 Pain Score: 2  (c/o of back soreness) Pain Location: Back   ADL: See care tool for more details.   Therapy/Group: Individual Therapy  Maury Dus OTR/L  10/18/2020, 10:43 AM

## 2020-10-18 NOTE — Progress Notes (Signed)
[place] PHYSICAL MEDICINE & REHABILITATION PROGRESS NOTE   Subjective/Complaints: Complaining of dyspnea this morning. He is having increased edema in his arms as well. Will give another 20mg  Lasix this morning. Cardiology note reviewed and Dr. Percival Spanish is considering standing diuretic- potassium and creatinine are stable.    ROS: + dyspnea Objective:   No results found. Recent Labs    10/16/20 0836  WBC 12.7*  HGB 8.8*  HCT 28.2*  PLT 292   Recent Labs    10/16/20 0836  NA 140  K 3.6  CL 109  CO2 20*  GLUCOSE 98  BUN 26*  CREATININE 1.01  CALCIUM 7.9*    Intake/Output Summary (Last 24 hours) at 10/18/2020 0954 Last data filed at 10/18/2020 0600 Gross per 24 hour  Intake 200 ml  Output 700 ml  Net -500 ml     Pressure Injury 10/14/20 Buttocks Left Deep Tissue Pressure Injury - Purple or maroon localized area of discolored intact skin or blood-filled blister due to damage of underlying soft tissue from pressure and/or shear. Purple/red area small fluid filled  (Active)  10/14/20 1700  Location: Buttocks  Location Orientation: Left  Staging: Deep Tissue Pressure Injury - Purple or maroon localized area of discolored intact skin or blood-filled blister due to damage of underlying soft tissue from pressure and/or shear.  Wound Description (Comments): Purple/red area small fluid filled blister noted at center  Present on Admission: Yes    Physical Exam: Vital Signs Blood pressure (!) 159/54, pulse 63, temperature (!) 97.4 F (36.3 C), resp. rate 19, height 5\' 5"  (1.651 m), weight 64.2 kg, SpO2 98 %. Gen: no distress, normal appearing HEENT: oral mucosa pink and moist, NCAT Cardio: Reg rate Chest: increased work of breathing Abd: soft, non-distended Ext: no edema Skin: intact Neurologic: Cranial nerves II through XII intact, motor strength is 5/5 inrightdeltoid, bicep, tricep, grip, hip flexor, knee extensors, ankle dorsiflexor and plantar flexor To minus  left deltoid biceps triceps finger flexors 0 at the finger extensors 3 - left hip flexor knee extensor ankle dorsiflexor Sensory exam normal sensation to light touch in bilateral upper and lower extremities Cerebellar exam normal finger to nose to fingerbilaterally musculoskeletal: Full range of motion in all 4 extremities. No joint swelling  Psych: pleasant, normal affect  Assessment/Plan: 1. Functional deficits which require 3+ hours per day of interdisciplinary therapy in a comprehensive inpatient rehab setting.  Physiatrist is providing close team supervision and 24 hour management of active medical problems listed below.  Physiatrist and rehab team continue to assess barriers to discharge/monitor patient progress toward functional and medical goals  Care Tool:  Bathing  Bathing activity did not occur: Refused Body parts bathed by patient: Chest, Left arm, Right arm, Face, Abdomen, Front perineal area   Body parts bathed by helper: Buttocks, Right lower leg, Left lower leg, Left upper leg, Right upper leg     Bathing assist Assist Level: Maximal Assistance - Patient 24 - 49%     Upper Body Dressing/Undressing Upper body dressing   What is the patient wearing?: Pull over shirt    Upper body assist Assist Level: Maximal Assistance - Patient 25 - 49%    Lower Body Dressing/Undressing Lower body dressing      What is the patient wearing?: Pants     Lower body assist Assist for lower body dressing: Maximal Assistance - Patient 25 - 49%     Toileting Toileting    Toileting assist Assist for toileting: Dependent -  Patient 0%     Transfers Chair/bed transfer  Transfers assist     Chair/bed transfer assist level: Dependent - mechanical lift (+2 mod A using stedy)     Locomotion Ambulation   Ambulation assist   Ambulation activity did not occur: Safety/medical concerns          Walk 10 feet activity   Assist  Walk 10 feet activity did not occur:  Safety/medical concerns        Walk 50 feet activity   Assist           Walk 150 feet activity   Assist Walk 150 feet activity did not occur: Safety/medical concerns         Walk 10 feet on uneven surface  activity   Assist Walk 10 feet on uneven surfaces activity did not occur: Safety/medical concerns         Wheelchair     Assist Will patient use wheelchair at discharge?: Yes Type of Wheelchair: Manual    Wheelchair assist level: Dependent - Patient 0% (attempts but unable to propel with UE's)      Wheelchair 50 feet with 2 turns activity    Assist        Assist Level: Dependent - Patient 0%   Wheelchair 150 feet activity     Assist      Assist Level: Dependent - Patient 0%   Blood pressure (!) 159/54, pulse 63, temperature (!) 97.4 F (36.3 C), resp. rate 19, height 5\' 5"  (1.651 m), weight 64.2 kg, SpO2 98 %.    Medical Problem List and Plan: 1.Left side weakness dysarthria/dysphagiasecondary to right MCA infarction due to right ICA high-grade stenosis status post angioplasty -patient may shower -ELOS/Goals:17 to 21 days  -Interdisciplinary Team Conference today  2. Antithrombotics: -DVT/anticoagulation:Per neurology stopping Xarelto-antiplatelet therapy: Aspirin81 mg daily 3. Pain Management:Tylenol as needed. Does not appear to be in pain.  4. Mood:Provide emotional support -antipsychotic agents: N/A 5. Neuropsych: This patientiscapable of making decisions on hisown behalf. 6. Skin/Wound Care:Routine skin checks 7. Fluids/Electrolytes/Nutrition:Routine in and outs with follow-up chemistries. Continues to have significant swelling. Lasix as in #19.  8. Atrial fibrillation/tachybradycardia syndrome/pacemaker. 11/24: rate controlled. Continue Toprol25 mg daily 9. SCC of the carotid gland. Follow-up outpatient 10. Hypertension. Toprol 25 mg daily. Monitor  with increased mobility 11. Hypothyroidism. Synthroid 12. Bacteremia.Ancef 2 g every 8 hours and complete10-daycourse 13. Hyperlipidemia. Lipitor 14. Dysphagia. Dysphagia #1 thin liquids. Follow-up speech therapy 15.CKD stage III. Baseline 1.30-1.48. Follow-up chemistries 16. Acute blood loss anemia with history of GI bleed 2019. Patient transfused 1 unit packed red blood cells 10/12/2020. Continue iron supplement. Protonix 40 mg twice daily. Follow-up CBC-still pending from this morning, will transfuse if less than 7 g  17. Prediabetes. Hemoglobin A1c 5.7. SSI.  18. Leukocytosis is trending downward.   19. Dyspnea: 11/24: Give another 20mg  of Lasix today.  LOS: 4 days A FACE TO FACE EVALUATION WAS PERFORMED  Clide Deutscher Arianah Torgeson 10/18/2020, 9:54 AM

## 2020-10-18 NOTE — Progress Notes (Signed)
Progress Note  Patient Name: Fernando [name] Date of Encounter: 10/18/2020  Primary Cardiologist:   No primary care provider on file.   Subjective   He is awake and alert.  He does report some dyspnea.  No pain  Inpatient Medications    Scheduled Meds: . aspirin EC  81 mg Oral Daily  . atorvastatin  20 mg Oral Daily  . chlorhexidine  15 mL Mouth Rinse BID  . enoxaparin (LOVENOX) injection  40 mg Subcutaneous Q24H  . ferrous sulfate  325 mg Oral TID WC  . levothyroxine  50 mcg Oral Q0600  . mouth rinse  15 mL Mouth Rinse q12n4p  . metoprolol succinate  25 mg Oral Daily  . pantoprazole sodium  40 mg Oral BID   Continuous Infusions:  PRN Meds: acetaminophen **OR** acetaminophen, cloNIDine, loperamide   Vital Signs    Vitals:   10/17/20 1340 10/17/20 2015 10/18/20 0500 10/18/20 0524  BP: (!) 136/59 (!) 136/56  (!) 159/54  Pulse: 60 65  63  Resp: 18 18  19   Temp: 97.9 F (36.6 C) (!) 97.4 F (36.3 C)  (!) 97.4 F (36.3 C)  TempSrc:      SpO2:  100%  98%  Weight:   64.2 kg   Height:        Intake/Output Summary (Last 24 hours) at 10/18/2020 9767 Last data filed at 10/18/2020 0600 Gross per 24 hour  Intake 280 ml  Output 700 ml  Net -420 ml   Filed Weights   10/16/20 0427 10/17/20 0500 10/18/20 0500  Weight: 66 kg 67.7 kg 64.2 kg    Telemetry    NA - Personally Reviewed  ECG    NA - Personally Reviewed  Physical Exam   GEN: No  acute distress.   Neck: No  JVD Cardiac: Irregular RR, no murmurs, rubs, or gallops.  Respiratory:    Decreased breath sounds with some basilar crackles.  GI: Soft, nontender, non-distended, normal bowel sounds  MS:  No edema; No deformity. Neuro:   Nonfocal  Psych: Oriented and appropriate    Labs    Chemistry Recent Labs  Lab 10/13/20 0246 10/14/20 0327 10/16/20 0836  NA 145 143 140  K 3.3* 3.9 3.6  CL 114* 112* 109  CO2 22 20* 20*  GLUCOSE 115* 105* 98  BUN 30* 26* 26*  CREATININE 1.11 1.03 1.01    CALCIUM 7.9* 7.6* 7.9*  PROT  --   --  5.4*  ALBUMIN  --   --  2.1*  AST  --   --  18  ALT  --   --  5  ALKPHOS  --   --  65  BILITOT  --   --  1.2  GFRNONAA >60 >60 >60  ANIONGAP 9 11 11      Hematology Recent Labs  Lab 10/13/20 0246 10/14/20 1756 10/16/20 0836  WBC 10.7* 14.8* 12.7*  RBC 3.81* 4.19* 3.54*  HGB 9.2* 10.0* 8.8*  HCT 29.2* 32.8* 28.2*  MCV 76.6* 78.3* 79.7*  MCH 24.1* 23.9* 24.9*  MCHC 31.5 30.5 31.2  RDW 19.7* 21.4* 22.1*  PLT 248 325 292    Cardiac EnzymesNo results for input(s): TROPONINI in the last 168 hours. No results for input(s): TROPIPOC in the last 168 hours.   BNPNo results for input(s): BNP, PROBNP in the last 168 hours.   DDimer No results for input(s): DDIMER in the last 168 hours.   Radiology    No results found.  Cardiac Studies   Echo 10/07/20 . Akinesis of the distal septum and apex; overall mildly reduced LV  function; no thrombus noted using definity.  2. Left ventricular ejection fraction, by estimation, is 40 to 45%. The  left ventricle has mildly decreased function. The left ventricle  demonstrates regional wall motion abnormalities (see scoring  diagram/findings for description). Left ventricular  diastolic function could not be evaluated.  3. Right ventricular systolic function is normal. The right ventricular  size is normal.  4. Left atrial size was severely dilated.  5. Right atrial size was severely dilated.  6. The mitral valve is normal in structure. Mild mitral valve  regurgitation. No evidence of mitral stenosis.  7. Tricuspid valve regurgitation is moderate.  8. The aortic valve is tricuspid. Aortic valve regurgitation is mild.  Mild aortic valve stenosis.  9. The inferior vena cava is normal in size with greater than 50%  respiratory variability, suggesting right atrial pressure of 3 mmHg.    Patient Profile     86 y.o. male with pmh of tachy-brady syndrome s/p pacemaker, permanent aib,  carotid stenosis s/p L CEA and high grade proximal right carotid stenosis, recent TIA with left hemiparehsis and expressive aphasia in August 2021, dyslipidemia, HTN, osteoarthritis, remote squamous cell carcinoma  who is being seen for the evaluation of stroke and bacteremia.  We were called back secondary to volume overload.    Assessment & Plan    Sepsis/strep agalactiae bactermia:  Treated.     Right MCA stroke:   CAS.  On ASA.    Permanent atrial fib:   Rate was controlled and was on Xarelto.   Holding secondary to GI bleed.  Would resume if he is thought not to be actively bleeding and Hgb stabilizes over a few days.   Tachy brady status post pacemaker:   Rate is controlled.  No DOAC as above.   HTN:  BP is labile. He has not required prn clonidine in the last two days.  Given the lower EF. I would suggest Cozaar 25 mg daily if BP trends up.      Cardiomyopathy:   New.  No plan for ischemia work up. Intake and out put are not collected strictly.  Weight has progressively gone down.  Given PO Lasix today.  He likely will need a daily dose.   For questions or updates, please contact [facility] Please consult [email or web address] for contact info under Cardiology/STEMI.   Signed, Minus Breeding, MD  10/18/2020, 8:22 AM

## 2020-10-18 NOTE — Progress Notes (Signed)
Speech Language Pathology Daily Session Note  Patient Details  Name: Fernando [name] MRN: [id number] Date of Birth: December 05, 1926  Today's Date: 10/18/2020 SLP Individual Time: 1401-1500 SLP Individual Time Calculation (min): 59 min  Short Term Goals: Week 1: SLP Short Term Goal 1 (Week 1): Pt will consume current diet with appreciable change in respiratory status or other vitals to indicate tolerance, and Mod A cues for use of compensatory swallow stategies. SLP Short Term Goal 2 (Week 1): Pt will consume trials of Dys 2 textures demonstrating efficinent mastication and oral clearance X3 prior to upgrade. SLP Short Term Goal 3 (Week 1): Pt will use compensatory strategies to increase speech intelligibility to 50% at the word level with Max A multimodal cues. SLP Short Term Goal 4 (Week 1): Pt will recall 1 safety precaution with Mod A cues for use of strategies and/or aids. SLP Short Term Goal 5 (Week 1): Pt will demonstrate ability to problem solve basic functional and familiar situations with Mod A cues. SLP Short Term Goal 6 (Week 1): Pt will identify 1 physical and 1 cognitive-linguistic deficit in setting of acute CVA with Mod A cues.  Skilled Therapeutic Interventions: Pt seen for ongoing treatment of dysphagia and dysarthria, pt pleasant and cooperative with all therapeutic tasks. Pt agreeable to puree/thin trials this date, requesting coke. Pt re-educated on compensatory swallow strategies including effortful swallow. Pt requiring mod A and encouragement for self feeding 2' L weakness and edema. Puree trials with delayed A-P phase and mild L anterior loss of bolus. Cough x3 over 2 oz trials, likely baseline function 2' hx oral CA and treatment. 5 oz thin liquid via straw with throat clear and cough x4, vocal quality clear before continuing trials. Pt and son endorse this is patient's baseline function. Deferred advanced texture trials d/t patient's low endurance and oral phase impairment with  purees. Pt presented verbal stimuli to target speech intelligibility. Pt verbalizing simple 3-4 word phrases with ~70% intelligibility and Mod verbal cues to utilize breath support in increased vocal volume. Son present for end of tx session with hearing aides (needing to be charged). Son endorses dentures and hearing aides improve pt speech intelligibility. Son and patient educated on goals of ST tx, verbalized understanding. Pt left upright in wheelchair with all needs within reach and son present. Cont ST POC.   Pain Pain Assessment Pain Scale: 0-10 Pain Score: 0-No pain Faces Pain Scale: No hurt Pain Type: Acute pain Pain Location: Back Pain Intervention(s): Medication (See eMAR)  Therapy/Group: Individual Therapy  Dewaine Conger 10/18/2020, 2:30 PM

## 2020-10-19 DIAGNOSIS — I5021 Acute systolic (congestive) heart failure: Secondary | ICD-10-CM

## 2020-10-19 DIAGNOSIS — I10 Essential (primary) hypertension: Secondary | ICD-10-CM

## 2020-10-19 DIAGNOSIS — R7881 Bacteremia: Secondary | ICD-10-CM

## 2020-10-19 LAB — GLUCOSE, CAPILLARY
Glucose-Capillary: 108 mg/dL — ABNORMAL HIGH (ref 70–99)
Glucose-Capillary: 98 mg/dL (ref 70–99)
Glucose-Capillary: 91 mg/dL (ref 70–99)
Glucose-Capillary: 113 mg/dL — ABNORMAL HIGH (ref 70–99)

## 2020-10-19 LAB — BASIC METABOLIC PANEL
Anion gap: 9 (ref 5–15)
BUN: 21 mg/dL (ref 8–23)
CO2: 27 mmol/L (ref 22–32)
Calcium: 7.9 mg/dL — ABNORMAL LOW (ref 8.9–10.3)
Chloride: 105 mmol/L (ref 98–111)
Creatinine, Ser: 0.99 mg/dL (ref 0.61–1.24)
GFR, Estimated: >60 mL/min (ref >60)
Glucose, Bld: 118 mg/dL — ABNORMAL HIGH (ref 70–99)
Potassium: 3.3 mmol/L — ABNORMAL LOW (ref 3.5–5.1)
Sodium: 141 mmol/L (ref 135–145)

## 2020-10-19 LAB — MAGNESIUM: Magnesium: 2 mg/dL (ref 1.7–2.4)

## 2020-10-19 MED ORDER — FUROSEMIDE 20 MG PO TABS
20.0000 mg | ORAL_TABLET | Freq: Every day | ORAL | Status: DC
  Administered 2020-10-19 – 2020-11-11 (×23): 20 mg via ORAL
  Filled 2020-10-19 (×24): qty 1

## 2020-10-19 NOTE — Progress Notes (Signed)
Progress Note  Patient Name: Fernando [name] Date of Encounter: 10/19/2020  Primary Cardiologist: No primary care provider on file.   Subjective   No acute events overnight. He is resting this morning but tells me he has no pain or shortness of breath.  Inpatient Medications    Scheduled Meds: . aspirin EC  81 mg Oral Daily  . atorvastatin  20 mg Oral Daily  . chlorhexidine  15 mL Mouth Rinse BID  . enoxaparin (LOVENOX) injection  40 mg Subcutaneous Q24H  . ferrous sulfate  325 mg Oral TID WC  . levothyroxine  50 mcg Oral Q0600  . mouth rinse  15 mL Mouth Rinse q12n4p  . metoprolol succinate  25 mg Oral Daily  . pantoprazole sodium  40 mg Oral BID   Continuous Infusions:  PRN Meds: acetaminophen **OR** acetaminophen, cloNIDine, loperamide   Vital Signs    Vitals:   10/18/20 2054 10/19/20 0418 10/19/20 0432 10/19/20 0631  BP: (!) 147/47 (!) 178/82  (!) 106/55  Pulse: (!) 59 63  64  Resp: 14 16    Temp: 97.7 F (36.5 C) 97.9 F (36.6 C)    TempSrc:      SpO2: 94% 97%    Weight:   63.1 kg   Height:        Intake/Output Summary (Last 24 hours) at 10/19/2020 0704 Last data filed at 10/18/2020 1827 Gross per 24 hour  Intake 115 ml  Output 150 ml  Net -35 ml   Filed Weights   10/17/20 0500 10/18/20 0500 10/19/20 0432  Weight: 67.7 kg 64.2 kg 63.1 kg    ECG    No new - Personally Reviewed  Physical Exam   GEN: No acute distress.  Resting this morning but awakens to have a conversation.  No complaints. Neck: No JVD Cardiac:  Irregularly irregular, no murmurs, rubs, or gallops.  Respiratory: Clear to auscultation bilaterally. GI: Soft, nontender, non-distended  MS: No edema; No deformity. Neuro:  Nonfocal  Psych: Normal affect   Labs    Chemistry Recent Labs  Lab 10/13/20 0246 10/14/20 0327 10/16/20 0836  NA 145 143 140  K 3.3* 3.9 3.6  CL 114* 112* 109  CO2 22 20* 20*  GLUCOSE 115* 105* 98  BUN 30* 26* 26*  CREATININE 1.11 1.03 1.01   CALCIUM 7.9* 7.6* 7.9*  PROT  --   --  5.4*  ALBUMIN  --   --  2.1*  AST  --   --  18  ALT  --   --  5  ALKPHOS  --   --  65  BILITOT  --   --  1.2  GFRNONAA >60 >60 >60  ANIONGAP 9 11 11      Hematology Recent Labs  Lab 10/13/20 0246 10/14/20 1756 10/16/20 0836  WBC 10.7* 14.8* 12.7*  RBC 3.81* 4.19* 3.54*  HGB 9.2* 10.0* 8.8*  HCT 29.2* 32.8* 28.2*  MCV 76.6* 78.3* 79.7*  MCH 24.1* 23.9* 24.9*  MCHC 31.5 30.5 31.2  RDW 19.7* 21.4* 22.1*  PLT 248 325 292    Cardiac EnzymesNo results for input(s): TROPONINI in the last 168 hours. No results for input(s): TROPIPOC in the last 168 hours.   BNPNo results for input(s): BNP, PROBNP in the last 168 hours.   DDimer No results for input(s): DDIMER in the last 168 hours.   Radiology    No results found.  Cardiac Studies   No new  Assessment & Plan  86  y.o. male with pmh of tachy-brady syndrome s/p pacemaker, permanent aib, carotid stenosis s/p L CEA and high grade proximal right carotid stenosis, recent TIA with left hemiparehsis and expressive aphasia in August 2021, dyslipidemia, HTN, osteoarthritis, remote squamous cell carcinoma who is being seen for the evaluation of stroke and bacteremia, and volume overload.  1.  Sepsis/strep agalactiae bacteremia Treated Given age and comorbidities not a good candidate for lead extraction  2.  Tachycardia-bradycardia syndrome post pacemaker implant Rate controlled Not currently anticoagulated given GI bleeding  3.  Acute systolic heart failure Not significantly volume overloaded on my exam this morning.  He is warm.  Breathing comfortably while lying flat. Responded well to 20 mg of p.o. Lasix on November 24. Recommend starting Lasix 20 mg once daily.  Recommend checking chemistry panel to ensure stable renal function and electrolytes given new start diuretic.  4.  Hypertension Overall well controlled although there continues to be sporadic elevated readings. Given age  and comorbidities do not think aggressive control of his hypertension is warranted. Continue clonidine as needed for systolic blood pressures greater than 160 mmHg.    For questions or updates, please contact [facility] Please consult [email or web address] for contact info under Cardiology/STEMI.      Signed, Lars Mage, MD  10/19/2020, 7:04 AM

## 2020-10-19 NOTE — Progress Notes (Signed)
[facility] PHYSICAL MEDICINE & REHABILITATION PROGRESS NOTE   Subjective/Complaints:  Pt reports feeling better today- less SOB, less swelling.  Denies pain Eating breakfast- D1 thin diet.  LBM this AM and last night- pt didn't remember when had LBM (1 hour prior).   ROS: + dyspnea Objective:   No results found. No results for input(s): WBC, HGB, HCT, PLT in the last 72 hours. No results for input(s): NA, K, CL, CO2, GLUCOSE, BUN, CREATININE, CALCIUM in the last 72 hours.  Intake/Output Summary (Last 24 hours) at 10/19/2020 1037 Last data filed at 10/19/2020 0801 Gross per 24 hour  Intake 235 ml  Output 150 ml  Net 85 ml     Pressure Injury 10/14/20 Buttocks Left Deep Tissue Pressure Injury - Purple or maroon localized area of discolored intact skin or blood-filled blister due to damage of underlying soft tissue from pressure and/or shear. Purple/red area small fluid filled  (Active)  10/14/20 1700  Location: Buttocks  Location Orientation: Left  Staging: Deep Tissue Pressure Injury - Purple or maroon localized area of discolored intact skin or blood-filled blister due to damage of underlying soft tissue from pressure and/or shear.  Wound Description (Comments): Purple/red area small fluid filled blister noted at center  Present on Admission: Yes    Physical Exam: Vital Signs Blood pressure (!) 106/55, pulse 64, temperature 97.9 F (36.6 C), resp. rate 16, height 5\' 5"  (1.651 m), weight 63.1 kg, SpO2 97 %. Gen: sitting up in bedside chair, eating breakfast, NAD HEENT: conjugate gaze Cardio: borderline bradycardia- irregular rhythm Chest: no W/R/R- good air movement VZD:GLOV, NT, ND, (+)BS  Ext: no edema Skin: intact Neurologic: Cranial nerves II through XII intact, motor strength is 5/5 inrightdeltoid, bicep, tricep, grip, hip flexor, knee extensors, ankle dorsiflexor and plantar flexor To minus left deltoid biceps triceps finger flexors 0 at the finger extensors 3 -  left hip flexor knee extensor ankle dorsiflexor Sensory exam normal sensation to light touch in bilateral upper and lower extremities Cerebellar exam normal finger to nose to fingerbilaterally musculoskeletal: Full range of motion in all 4 extremities. No joint swelling  Psych:appropriate, poor historiant  Assessment/Plan: 1. Functional deficits which require 3+ hours per day of interdisciplinary therapy in a comprehensive inpatient rehab setting.  Physiatrist is providing close team supervision and 24 hour management of active medical problems listed below.  Physiatrist and rehab team continue to assess barriers to discharge/monitor patient progress toward functional and medical goals  Care Tool:  Bathing  Bathing activity did not occur: Refused Body parts bathed by patient: Chest, Left arm, Right arm, Face, Abdomen, Front perineal area   Body parts bathed by helper: Buttocks, Right lower leg, Left lower leg, Left upper leg, Right upper leg     Bathing assist Assist Level: Maximal Assistance - Patient 24 - 49%     Upper Body Dressing/Undressing Upper body dressing   What is the patient wearing?: Pull over shirt    Upper body assist Assist Level: Maximal Assistance - Patient 25 - 49%    Lower Body Dressing/Undressing Lower body dressing      What is the patient wearing?: Pants     Lower body assist Assist for lower body dressing: Maximal Assistance - Patient 25 - 49%     Toileting Toileting    Toileting assist Assist for toileting: Total Assistance - Patient < 25%     Transfers Chair/bed transfer  Transfers assist     Chair/bed transfer assist level: 2 Helpers  Locomotion Ambulation   Ambulation assist   Ambulation activity did not occur: Safety/medical concerns          Walk 10 feet activity   Assist  Walk 10 feet activity did not occur: Safety/medical concerns        Walk 50 feet activity   Assist           Walk 150 feet  activity   Assist Walk 150 feet activity did not occur: Safety/medical concerns         Walk 10 feet on uneven surface  activity   Assist Walk 10 feet on uneven surfaces activity did not occur: Safety/medical concerns         Wheelchair     Assist Will patient use wheelchair at discharge?: Yes Type of Wheelchair: Manual    Wheelchair assist level: Dependent - Patient 0% (attempts but unable to propel with UE's)      Wheelchair 50 feet with 2 turns activity    Assist        Assist Level: Dependent - Patient 0%   Wheelchair 150 feet activity     Assist      Assist Level: Dependent - Patient 0%   Blood pressure (!) 106/55, pulse 64, temperature 97.9 F (36.6 C), resp. rate 16, height 5\' 5"  (1.651 m), weight 63.1 kg, SpO2 97 %.    Medical Problem List and Plan: 1.Left side weakness dysarthria/dysphagiasecondary to right MCA infarction due to right ICA high-grade stenosis status post angioplasty -patient may shower -ELOS/Goals:17 to 21 days  -Interdisciplinary Team Conference today  2. Antithrombotics: -DVT/anticoagulation:Per neurology stopping Xarelto-antiplatelet therapy: Aspirin81 mg daily 3. Pain Management:Tylenol as needed. Does not appear to be in pain.  11/25- denies pain- con't tylenol prn  4. Mood:Provide emotional support -antipsychotic agents: N/A 5. Neuropsych: This patientiscapable of making decisions on hisown behalf. 6. Skin/Wound Care:Routine skin checks 7. Fluids/Electrolytes/Nutrition:Routine in and outs with follow-up chemistries. Continues to have significant swelling. Lasix as in #19.  8. Atrial fibrillation/tachybradycardia syndrome/pacemaker. 11/24: rate controlled. 11/25- rate controlled con't regimen   Continue Toprol25 mg daily 9. SCC of the carotid gland. Follow-up outpatient 10. Hypertension. Toprol 25 mg daily. Monitor with increased  mobility 11. Hypothyroidism. Synthroid 12. Bacteremia.Ancef 2 g every 8 hours and complete10-daycourse 13. Hyperlipidemia. Lipitor 14. Dysphagia. Dysphagia #1 thin liquids. Follow-up speech therapy  11/25- eating well- con't diet 15.CKD stage III. Baseline 1.30-1.48. Follow-up chemistries  11/25- will check labs today and recheck over weekend since starting Lasix 20 mg daily, per Cards 16. Acute blood loss anemia with history of GI bleed 2019. Patient transfused 1 unit packed red blood cells 10/12/2020. Continue iron supplement. Protonix 40 mg twice daily. Follow-up CBC-still pending from this morning, will transfuse if less than 7 g  17. Prediabetes. Hemoglobin A1c 5.7. SSI.  18. Leukocytosis is trending downward.   19. Dyspnea: 11/24: Give another 20mg  of Lasix today.  11/25- no SOB this AM- on Lasix 20 mg daily per Cards, now   LOS: 5 days A FACE TO FACE EVALUATION WAS PERFORMED  Fernando [name] 10/19/2020, 10:37 AM

## 2020-10-20 ENCOUNTER — Inpatient Hospital Stay (HOSPITAL_COMMUNITY): Payer: PPO | Admitting: Occupational Therapy

## 2020-10-20 ENCOUNTER — Inpatient Hospital Stay (HOSPITAL_COMMUNITY): Payer: PPO | Admitting: Physical Therapy

## 2020-10-20 ENCOUNTER — Inpatient Hospital Stay (HOSPITAL_COMMUNITY): Payer: PPO | Admitting: Speech Pathology

## 2020-10-20 DIAGNOSIS — D62 Acute posthemorrhagic anemia: Secondary | ICD-10-CM

## 2020-10-20 DIAGNOSIS — N1831 Chronic kidney disease, stage 3a: Secondary | ICD-10-CM

## 2020-10-20 DIAGNOSIS — I69391 Dysphagia following cerebral infarction: Secondary | ICD-10-CM

## 2020-10-20 DIAGNOSIS — D72829 Elevated white blood cell count, unspecified: Secondary | ICD-10-CM

## 2020-10-20 DIAGNOSIS — R7303 Prediabetes: Secondary | ICD-10-CM

## 2020-10-20 LAB — GLUCOSE, CAPILLARY
Glucose-Capillary: 100 mg/dL — ABNORMAL HIGH (ref 70–99)
Glucose-Capillary: 103 mg/dL — ABNORMAL HIGH (ref 70–99)
Glucose-Capillary: 115 mg/dL — ABNORMAL HIGH (ref 70–99)

## 2020-10-20 NOTE — Progress Notes (Addendum)
Occupational Therapy Weekly Progress Note  Patient Details  Name: Fernando [name] MRN: [id number] Date of Birth: [birth year]  Beginning of progress report period: October 15, 2020 End of progress report period: October 20, 2020  Today's Date: 10/20/2020 OT Individual Time: 0923-3007 OT Individual Time Calculation (min): 61 min    Patient has met 1 of 3 short term goals.  Pt is making slow progress with OT at this time.  He continues to demonstrate decreased overall endurance for selfcare tasks with dyspnea at 3/4 at times with bed mobility and selfcare tasks supine to sit.  Currently he needs mod assist for UB bathing in sitting with max assist for UB dressing.  LB bathing is at a max assist level sit to supine with LB dressing at total assist in supine rolling.  He is able to roll in the bed to assist with selfcare tasks at a min assist level with min instructional cueing.  Supine to sit on the right side is still max assist as well.  Static sitting EOB in preparation for selfcare tasks is supervision.  He continues to exhibit increased BUE weakness with the LUE being slightly more impaired than the right.  He needs mod assist overall for self feeding, but is approaching min assist level with the dominant LUE.  Increased edema is still present in BUEs and hands as well.  He does exhibit what appears to be restrictions in bilateral MP flexion with PROM pre-existing.  Transfers are currently total assist +2 (pt 30%) with use of the sliding board to the wheelchair.  Have not attempted transfer to the drop arm commode yet at this time.  Sit to stand is also +2 assist (pt 20%) with pt demonstrating only partial standing with increased trunk and cervical flexion.  Overall feel pt is making slower gains based on medical issues as well as his limited endurance.  Feel he will benefit from continued CIR level OT to address these deficits to reach manageable level with one person assist for discharge home with  his son.    Patient continues to demonstrate the following deficits: muscle weakness and muscle joint tightness, decreased cardiorespiratoy endurance, unbalanced muscle activation and decreased coordination and decreased initiation, decreased awareness, decreased problem solving and delayed processing and therefore will continue to benefit from skilled OT intervention to enhance overall performance with BADL and Reduce care partner burden.  Patient progressing toward long term goals..  Continue plan of care.  OT Short Term Goals Week 2:  OT Short Term Goal 1 (Week 2): Pt will don shirt with mod A OT Short Term Goal 2 (Week 2): Pt will complete self feeding with LRAD with min A OT Short Term Goal 3 (Week 2): Pt will complete sit to stand for progressing of LB selfcare with max assist. OT Short Term Goal 4 (Week 2): Pt will perform squat pivot transfer to the drop arm commode with max assist.  Skilled Therapeutic Interventions/Progress Updates:    Pt received sup in bed with bed alarm engaged. Session focused on self-care and BUE strengthening. Pt completed side-rolling left to right with min A for initiation and hand placement on bed rail. Donned pants, ted hose, and gripper socks with total A at bed level. Sup to sitting EOB with max A, maintains static sitting balance with close supervision d/t decreased functional strength. Pt cont to exhibit dyspnea with activity, O2 sat remained at ~95% on RA with HR in mid 60s bpm throughout session. Completed slide-board transfer to w/c  from EOB with total A + 2 (pt 20%) to shift weight and scoot across board. Pt assists with hand placement on w/c / bed and lifts. Donned and adjusted bilat hearing aids with total A. Once in chair, pt washed face at sink with min A to lift LUE to wet cloth under the faucet. Pt opened soap bottle with R hand with ind but req setup A to hold bottle in L hand. Rinsed mouth with max A to control water flow. Pt completed 8 reps of bilat  shoulder flexion up to ~100 degrees while holding min weighted cup in prep for improved self-feeding performance. Pt then washed hospital table with wash cloth alternating use of BUE, with focus on improving shoulder flexion and elbow flexion/extension movements. Able to clean 75% of table. Left pt seated in w/c with BUE supported by pillows, adapted call bell in reach, seatbelt alarm engaged, and all immediate needs met.  Therapy Documentation Precautions:  Precautions Precautions: ICD/Pacemaker, Fall Precaution Comments: L sided weakness; very HOH; dysarthria Restrictions Weight Bearing Restrictions: No  Vital Signs: O2 sat: ~95% on RA throughout session HR: mid 60s bpm throughout session  Pain: Pain Assessment Pain Scale: 0-10 Pain Score: 0-No pain   Therapy/Group: Individual Therapy  Volanda Napoleon, OTR/L Clyda Greener OTR/L  10/20/2020, 12:21 PM

## 2020-10-20 NOTE — Progress Notes (Signed)
Physical Therapy Session Note  Patient Details  Name: Fernando [name] MRN: [id number] Date of Birth: 1927-08-31  Today's Date: 10/20/2020 PT Missed Time: 47 Minutes Missed Time Reason: Patient fatigue  Pt received asleep, supine in bed and upon awakening declines participation in therapy despite encouragement due to fatigue. Pt left supine in bed with needs in reach and bed alarm on.  Tawana Scale , PT, DPT, CSRS  10/20/2020, 3:43 PM

## 2020-10-20 NOTE — Progress Notes (Signed)
Physical Therapy Session Note  Patient Details  Name: Fernando [name] MRN: [id number] Date of Birth: 1926/12/24  Today's Date: 10/20/2020 PT Individual Time: 4696-2952 PT Individual Time Calculation (min): 60 min   Short Term Goals: Week 1:  PT Short Term Goal 1 (Week 1): Pt will be able to perform bed mobility with mod assist overall PT Short Term Goal 2 (Week 1): Pt will be able to perform bed <> chair transfer with mod assist PT Short Term Goal 3 (Week 1): Pt will be able to perform sit <> stands with mod +2  Skilled Therapeutic Interventions/Progress Updates:   Pt received sitting in w/c and agreeable to therapy session. Transported to/from gym in w/c for time management and energy conservation. Sit<>stands to/from EOM or w/c seat using stedy with mod assist of 1 for lifting into stand - starts with excessive trunk/hip flexion but with cuing is able to improve for increased upright posture. Stedy transfer w/c to EOM. Sit<>stands to/from high perched position on stedy seat using B UE support on stedy bar with min assist for lifting into standing - while standing in stedy progressed to reaching task via card matching to promote increased upright trunk posture with pt only able to match 1 card at shoulder height due to limited shoulder flexion AROM prior to requiring seated rest break due to significantly impaired cardiopulmonary and muscular endurance (matched 3 cards total). Sit<>stands EOM<>RW x3 reps with mod progressed to max assist for lifting on 3rd trial due to fatigue - tolerated static standing ~15 seconds then 1x progressed to attempting marching with pt weight shifting and lifting each foot 1x prior to needing to return to sitting due to impaired endurance - posterior lean in standing requiring mod assist for balance throughout. Pt reports fatigue with standing, stating he feels "awful" but states "I have to do it in order to go home." Monitored vitals as pt continues to demo increased  rate of respiration after each stand with SpO2 varying 93%-100% and HR 59-75bpm. Transitioned to sitting balance and trunk control task via lateral reaching to complete card matching task - pt demos L lateral trunk flexion/lean in sitting due to fatigue requiring close supervision for safety. Stedy transfer EOM>w/c again with mod assist for lifting/lowering to/from standing. Transported back to room and pt requesting to remain seated in w/c - left with needs in reach, B UEs elevated on pillows for edema management (B UE edema visually improved compared to last time this therapist saw patient), and seat belt alarm on.   Therapy Documentation Precautions:  Precautions Precautions: ICD/Pacemaker, Fall Precaution Comments: L sided weakness; very HOH; dysarthria Restrictions Weight Bearing Restrictions: No  Pain: No reports of pain throughout session but generally states he feels "awful" while standing but unable to elaborate further despite questioning.    Therapy/Group: Individual Therapy  Tawana Scale , PT, DPT, CSRS  10/20/2020, 9:15 AM

## 2020-10-20 NOTE — Progress Notes (Signed)
Speech Language Pathology Daily Session Note  Patient Details  Name: Fernando [name] MRN: [id number] Date of Birth: 26-Jul-1927  Today's Date: 10/20/2020 SLP Individual Time: 1300-1345 SLP Individual Time Calculation (min): 45 min  Short Term Goals: Week 1: SLP Short Term Goal 1 (Week 1): Pt will consume current diet with appreciable change in respiratory status or other vitals to indicate tolerance, and Mod A cues for use of compensatory swallow stategies. SLP Short Term Goal 2 (Week 1): Pt will consume trials of Dys 2 textures demonstrating efficinent mastication and oral clearance X3 prior to upgrade. SLP Short Term Goal 3 (Week 1): Pt will use compensatory strategies to increase speech intelligibility to 50% at the word level with Max A multimodal cues. SLP Short Term Goal 4 (Week 1): Pt will recall 1 safety precaution with Mod A cues for use of strategies and/or aids. SLP Short Term Goal 5 (Week 1): Pt will demonstrate ability to problem solve basic functional and familiar situations with Mod A cues. SLP Short Term Goal 6 (Week 1): Pt will identify 1 physical and 1 cognitive-linguistic deficit in setting of acute CVA with Mod A cues.  Skilled Therapeutic Interventions: Pt was seen for skilled ST targeting cognitive goals. Pt's daughter present at bedside for 80% of session. Voice amplifier donned for session to aid with hearing (as pt's hearing aids were not functioning properly on arrival - placed on charger). Pt required fluctuating Min-Mod levels of verbal and visual cueing for problem solving and error awareness during a basic money management task using cash and coins (totaling, calculating change, displaying set amounts, etc.). He required increased Max A to problem solve and interpret basic medication labels. Pt left sitting in wheelchair with alarm set and needs within reach, daughter still present with questions about pt's progress answered. Continue per current plan of care.           Pain Pain Assessment Pain Score: Asleep  Therapy/Group: Individual Therapy  [name] 10/20/2020, 7:23 AM

## 2020-10-20 NOTE — Progress Notes (Addendum)
[place] PHYSICAL MEDICINE & REHABILITATION PROGRESS NOTE   Subjective/Complaints: Patient seen sitting up in a chair working with therapy this morning.  He states he slept well overnight.  Discussed mobility with therapies.  He was seen by cardiology yesterday, notes reviewed-recommending Lasix daily.  ROS: Limited due to cognition/HOH  Objective:   No results found. No results for input(s): WBC, HGB, HCT, PLT in the last 72 hours. Recent Labs    10/19/20 1059  NA 141  K 3.3*  CL 105  CO2 27  GLUCOSE 118*  BUN 21  CREATININE 0.99  CALCIUM 7.9*    Intake/Output Summary (Last 24 hours) at 10/20/2020 1453 Last data filed at 10/20/2020 1352 Gross per 24 hour  Intake 340 ml  Output --  Net 340 ml     Pressure Injury 10/14/20 Buttocks Left Deep Tissue Pressure Injury - Purple or maroon localized area of discolored intact skin or blood-filled blister due to damage of underlying soft tissue from pressure and/or shear. Purple/red area small fluid filled  (Active)  10/14/20 1700  Location: Buttocks  Location Orientation: Left  Staging: Deep Tissue Pressure Injury - Purple or maroon localized area of discolored intact skin or blood-filled blister due to damage of underlying soft tissue from pressure and/or shear.  Wound Description (Comments): Purple/red area small fluid filled blister noted at center  Present on Admission: Yes    Physical Exam: Vital Signs Blood pressure (!) 141/67, pulse 62, temperature 97.8 F (36.6 C), resp. rate 16, height 5\' 5"  (1.651 m), weight 65.5 kg, SpO2 100 %. Constitutional: No distress . Vital signs reviewed. HENT: Normocephalic.  Atraumatic. Eyes: EOMI. No discharge. Cardiovascular: No JVD.  RRR.  + Murmur. Respiratory: Normal effort.  No stridor.  Bilateral clear to auscultation. GI: Non-distended.  BS +. Skin: Warm and dry.  Intact. Psych: Normal mood.  Normal behavior. Musc: Lower extremity edema.  No tenderness in extremities. Neuro:  Alert and oriented x1 Extremely HOH Motor: Limited due to ability to follow commands-RUE: 5/5 proximal distal RLE: 4 -/5 proximal to distal LUE: 4 -/5 proximal distal LLE:?  3/5 proximal distal (limited due to participation) Psych:appropriate, poor historiant  Assessment/Plan: 1. Functional deficits which require 3+ hours per day of interdisciplinary therapy in a comprehensive inpatient rehab setting.  Physiatrist is providing close team supervision and 24 hour management of active medical problems listed below.  Physiatrist and rehab team continue to assess barriers to discharge/monitor patient progress toward functional and medical goals  Care Tool:  Bathing  Bathing activity did not occur: Refused Body parts bathed by patient: Face   Body parts bathed by helper: Buttocks, Right lower leg, Left lower leg, Left upper leg, Right upper leg     Bathing assist Assist Level: Minimal Assistance - Patient > 75%     Upper Body Dressing/Undressing Upper body dressing   What is the patient wearing?: Pull over shirt    Upper body assist Assist Level: Maximal Assistance - Patient 25 - 49%    Lower Body Dressing/Undressing Lower body dressing      What is the patient wearing?: Pants     Lower body assist Assist for lower body dressing: Total Assistance - Patient < 25%     Toileting Toileting    Toileting assist Assist for toileting: Total Assistance - Patient < 25%     Transfers Chair/bed transfer  Transfers assist     Chair/bed transfer assist level: 2 Helpers (slide board)     Locomotion Ambulation  Ambulation assist   Ambulation activity did not occur: Safety/medical concerns          Walk 10 feet activity   Assist  Walk 10 feet activity did not occur: Safety/medical concerns        Walk 50 feet activity   Assist           Walk 150 feet activity   Assist Walk 150 feet activity did not occur: Safety/medical concerns         Walk  10 feet on uneven surface  activity   Assist Walk 10 feet on uneven surfaces activity did not occur: Safety/medical concerns         Wheelchair     Assist Will patient use wheelchair at discharge?: Yes Type of Wheelchair: Manual    Wheelchair assist level: Dependent - Patient 0% (attempts but unable to propel with UE's)      Wheelchair 50 feet with 2 turns activity    Assist        Assist Level: Dependent - Patient 0%   Wheelchair 150 feet activity     Assist      Assist Level: Dependent - Patient 0%    Medical Problem List and Plan: 1.Left side hemiparesis dysarthria/dysphagiasecondary to right MCA infarction due to right ICA high-grade stenosis status post angioplasty  Continue CIR  2. Antithrombotics: -DVT/anticoagulation:Per neurology stopping Xarelto  -antiplatelet therapy: Aspirin81 mg daily 3. Pain Management:Tylenol as needed.  4. Mood:Provide emotional support -antipsychotic agents: N/A 5. Neuropsych: This patientis notcapable of making decisions on hisown behalf. 6. Skin/Wound Care:Routine skin checks 7. Fluids/Electrolytes/Nutrition:Routine in and outs  8. Atrial fibrillation/tachybradycardia syndrome/pacemaker.   Continue Toprol25 mg daily 9. SCC of the carotid gland. Follow-up outpatient 10. Hypertension. Toprol 25 mg daily.   Labile on 11/26, monitor for trend 11. Hypothyroidism. Synthroid 12. Bacteremia.Completed Ancef 2 g every 8 hours 10-daycourse 13.  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 Dysphagia # 1 thins liquids. Follow-up speech therapy 15. CKD stage II/III. Baseline 1.30-1.48.   Creatinine 0.99 on 11/25, labs ordered for tomorrow 16. Acute blood loss anemia with history of GI bleed 2019. Patient transfused 1 unit packed red blood cells 10/12/2020. Continue iron supplement. Protonix 40 mg twice daily.   Hemoglobin 8.8 on 1/22, labs ordered for Monday  17.  Prediabetes. Hemoglobin A1c 5.7. SSI.   Controlled on 11/26  18. Leukocytosis:   WBCs 12.7 on 11/22, labs ordered for Monday   Afebrile   Continue to monitor  19.  Acute systolic congestive heart failure-not appropriate for any aggressive interventions   Lasix 20 daily per cardiology, appreciate recs Filed Weights   10/18/20 0500 10/19/20 0432 10/20/20 0500  Weight: 64.2 kg 63.1 kg 65.5 kg    LOS: 6 days A FACE TO FACE EVALUATION WAS PERFORMED  Reisa Coppola Lorie Phenix 10/20/2020, 2:53 PM

## 2020-10-21 ENCOUNTER — Inpatient Hospital Stay (HOSPITAL_COMMUNITY): Payer: PPO

## 2020-10-21 LAB — BASIC METABOLIC PANEL
Anion gap: 12 (ref 5–15)
BUN: 22 mg/dL (ref 8–23)
CO2: 25 mmol/L (ref 22–32)
Calcium: 8.2 mg/dL — ABNORMAL LOW (ref 8.9–10.3)
Chloride: 103 mmol/L (ref 98–111)
Creatinine, Ser: 0.98 mg/dL (ref 0.61–1.24)
GFR, Estimated: >60 mL/min (ref >60)
Glucose, Bld: 94 mg/dL (ref 70–99)
Potassium: 4 mmol/L (ref 3.5–5.1)
Sodium: 140 mmol/L (ref 135–145)

## 2020-10-21 LAB — GLUCOSE, CAPILLARY
Glucose-Capillary: 96 mg/dL (ref 70–99)
Glucose-Capillary: 90 mg/dL (ref 70–99)
Glucose-Capillary: 123 mg/dL — ABNORMAL HIGH (ref 70–99)
Glucose-Capillary: 140 mg/dL — ABNORMAL HIGH (ref 70–99)

## 2020-10-21 MED ORDER — LOSARTAN POTASSIUM 50 MG PO TABS
25.0000 mg | ORAL_TABLET | Freq: Every day | ORAL | Status: DC
  Administered 2020-10-21 – 2020-10-25 (×5): 25 mg via ORAL
  Filled 2020-10-21 (×5): qty 1

## 2020-10-21 NOTE — Progress Notes (Signed)
Physical Therapy Session Note  Patient Details  Name: Fernando [name] MRN: [id number] Date of Birth: [birth year]  Today's Date: 10/21/2020 PT Individual Time: 0800-0859 PT Individual Time Calculation (min): 59 min   Short Term Goals: Week 1:  PT Short Term Goal 1 (Week 1): Pt will be able to perform bed mobility with mod assist overall PT Short Term Goal 2 (Week 1): Pt will be able to perform bed <> chair transfer with mod assist PT Short Term Goal 3 (Week 1): Pt will be able to perform sit <> stands with mod +2  Skilled Therapeutic Interventions/Progress Updates:    Pt received supine in bed, awake and agreeable to PT session, reports unrated back pain. Mobility and rest breaks provided for pain management. Donned TED's, socks, shoes, and pants with totalA while supine in bed. Encouraged him to participate in lower body dressing and he was able to incorporate RUE usage for pulling pants up. Supine<>sit with maxA with HOB raised ~35deg, use of bed rail. Able to sit EOB with minA due to posterior lean. Performed sit<>stand in [place] with mod/maxA for rising and modA for balance. Able to use BUE's to grasp Stedy bar to assist with rising. Stedy transfer to w/c and w/c transport to main therapy gym for time management and energy conservation. Focused remainder of session on repeated sit<>stands in [place] with dual task for placing matching cards with LUE, emphasizing functional reach and dynamic standing balance. He performed a total of 6 sit<>stands with modA in [place] and was able to place x6 cards on mirror in front of him, unable to tolerate standing for >10 seconds prior to fatigue. Noted dyspnea on exertion with simple tasks, O2 reading 100% resting and desaturating only to 95% with brief mobility, recovers quickly to baseline. He was returned to his room where he remained seated in w/c with needs in reach, safety belt alarm on, pillow propped on R side due to R lean in sitting.   Therapy  Documentation Precautions:  Precautions Precautions: ICD/Pacemaker, Fall Precaution Comments: L sided weakness; very HOH; dysarthria Restrictions Weight Bearing Restrictions: No   Therapy/Group: Individual Therapy  Dominiqua Cooner P Gurleen Larrivee PT 10/21/2020, 8:59 AM

## 2020-10-21 NOTE — Progress Notes (Signed)
Progress Note  Patient Name: Fernando [name] Date of Encounter: 10/21/2020  Primary Cardiologist:   Cristopher Peru, MD   Subjective   He denies pain or SOB.   Inpatient Medications    Scheduled Meds: . aspirin EC  81 mg Oral Daily  . atorvastatin  20 mg Oral Daily  . chlorhexidine  15 mL Mouth Rinse BID  . enoxaparin (LOVENOX) injection  40 mg Subcutaneous Q24H  . ferrous sulfate  325 mg Oral TID WC  . furosemide  20 mg Oral Daily  . levothyroxine  50 mcg Oral Q0600  . mouth rinse  15 mL Mouth Rinse q12n4p  . metoprolol succinate  25 mg Oral Daily  . pantoprazole sodium  40 mg Oral BID   Continuous Infusions:  PRN Meds: acetaminophen **OR** acetaminophen, cloNIDine, loperamide   Vital Signs    Vitals:   10/20/20 1308 10/20/20 1947 10/21/20 0430 10/21/20 0500  BP: (!) 141/67 (!) 123/59 (!) 167/67   Pulse: 62 (!) 59 63   Resp: 16 19 18    Temp: 97.8 F (36.6 C) 98.1 F (36.7 C) 98.2 F (36.8 C)   TempSrc:   Oral   SpO2: 100% 96% 96%   Weight:    65.1 kg  Height:        Intake/Output Summary (Last 24 hours) at 10/21/2020 0835 Last data filed at 10/20/2020 1914 Gross per 24 hour  Intake 310 ml  Output --  Net 310 ml   Filed Weights   10/19/20 0432 10/20/20 0500 10/21/20 0500  Weight: 63.1 kg 65.5 kg 65.1 kg    Telemetry    NA - Personally Reviewed  ECG    NA - Personally Reviewed  Physical Exam   GEN: No acute distress.  Frail appearing   Neck: No  JVD Cardiac: RRR, no murmurs, rubs, or gallops.  Respiratory: Clear  to auscultation bilaterally. GI: Soft, nontender, non-distended, normal bowel sounds  MS:  Trace  edema; No deformity. Neuro:   diffuse weakness Psych: Oriented and appropriate    Labs    Chemistry Recent Labs  Lab 10/16/20 0836 10/19/20 1059 10/21/20 0640  NA 140 141 140  K 3.6 3.3* 4.0  CL 109 105 103  CO2 20* 27 25  GLUCOSE 98 118* 94  BUN 26* 21 22  CREATININE 1.01 0.99 0.98  CALCIUM 7.9* 7.9* 8.2*  PROT 5.4*   --   --   ALBUMIN 2.1*  --   --   AST 18  --   --   ALT 5  --   --   ALKPHOS 65  --   --   BILITOT 1.2  --   --   GFRNONAA >60 >60 >60  ANIONGAP 11 9 12      Hematology Recent Labs  Lab 10/14/20 1756 10/16/20 0836  WBC 14.8* 12.7*  RBC 4.19* 3.54*  HGB 10.0* 8.8*  HCT 32.8* 28.2*  MCV 78.3* 79.7*  MCH 23.9* 24.9*  MCHC 30.5 31.2  RDW 21.4* 22.1*  PLT 325 292    Cardiac EnzymesNo results for input(s): TROPONINI in the last 168 hours. No results for input(s): TROPIPOC in the last 168 hours.   BNPNo results for input(s): BNP, PROBNP in the last 168 hours.   DDimer No results for input(s): DDIMER in the last 168 hours.   Radiology    No results found.  Cardiac Studies   Echo 10/07/20 . Akinesis of the distal septum and apex; overall mildly reduced LV  function;  no thrombus noted using definity.  2. Left ventricular ejection fraction, by estimation, is 40 to 45%. The  left ventricle has mildly decreased function. The left ventricle  demonstrates regional wall motion abnormalities (see scoring  diagram/findings for description). Left ventricular  diastolic function could not be evaluated.  3. Right ventricular systolic function is normal. The right ventricular  size is normal.  4. Left atrial size was severely dilated.  5. Right atrial size was severely dilated.  6. The mitral valve is normal in structure. Mild mitral valve  regurgitation. No evidence of mitral stenosis.  7. Tricuspid valve regurgitation is moderate.  8. The aortic valve is tricuspid. Aortic valve regurgitation is mild.  Mild aortic valve stenosis.  9. The inferior vena cava is normal in size with greater than 50%  respiratory variability, suggesting right atrial pressure of 3 mmHg.    Patient Profile     86 y.o. male with pmh of tachy-brady syndrome s/p pacemaker, permanent aib, carotid stenosis s/p L CEA and high grade proximal right carotid stenosis, recent TIA with left hemiparehsis  and expressive aphasia in August 2021, dyslipidemia, HTN, osteoarthritis, remote squamous cell carcinoma  who is being seen for the evaluation of stroke and bacteremia.  We were called back secondary to volume overload.    Assessment & Plan    Sepsis/strep agalactiae bactermia:  Treated.     Right MCA stroke:   CAS.  On ASA.    Permanent atrial fib:   Rate was controlled and was on Xarelto.   Holding secondary to GI bleed.  Would resume if he is thought not to be actively bleeding and Hgb stabilizes over a few days. This can be addressed before discharge.  No Hgb drawn in the past couple of days.  I will add to labs in the AM.   Tachy brady status post pacemaker:   Rate is controlled.  No DOAC as above.   HTN:  BP is trending up .  I will start Cozaar 25 mg daily.      Cardiomyopathy:   New.  No plan for ischemia work up. Intake and out put are not collected strictly.  Weight has progressively gone down.  Given PO Lasix today.  Started on daily dose with stable creat.  We can titrate further during this admisison.   For questions or updates, please contact [place] Please consult [email or web address] for contact info under Cardiology/STEMI.   Signed, Minus Breeding, MD  10/21/2020, 8:35 AM

## 2020-10-21 NOTE — Progress Notes (Signed)
[place] PHYSICAL MEDICINE & REHABILITATION PROGRESS NOTE   Subjective/Complaints: Patient seen sitting up in bed this morning about to eat breakfast.  He states he slept well overnight.  He states he does not feel well, but cannot identify a reason.  ROS: Limited due to cognition  Objective:   No results found. No results for input(s): WBC, HGB, HCT, PLT in the last 72 hours. Recent Labs    10/19/20 1059 10/21/20 0640  NA 141 140  K 3.3* 4.0  CL 105 103  CO2 27 25  GLUCOSE 118* 94  BUN 21 22  CREATININE 0.99 0.98  CALCIUM 7.9* 8.2*    Intake/Output Summary (Last 24 hours) at 10/21/2020 1411 Last data filed at 10/21/2020 1300 Gross per 24 hour  Intake 270 ml  Output --  Net 270 ml     Pressure Injury 10/14/20 Buttocks Left Deep Tissue Pressure Injury - Purple or maroon localized area of discolored intact skin or blood-filled blister due to damage of underlying soft tissue from pressure and/or shear. Purple/red area small fluid filled  (Active)  10/14/20 1700  Location: Buttocks  Location Orientation: Left  Staging: Deep Tissue Pressure Injury - Purple or maroon localized area of discolored intact skin or blood-filled blister due to damage of underlying soft tissue from pressure and/or shear.  Wound Description (Comments): Purple/red area small fluid filled blister noted at center  Present on Admission: Yes    Physical Exam: Vital Signs Blood pressure (!) 140/55, pulse 64, temperature 98 F (36.7 C), resp. rate 18, height 5\' 5"  (1.651 m), weight 65.1 kg, SpO2 100 %. Constitutional: No distress . Vital signs reviewed. HENT: Normocephalic.  Atraumatic. Eyes: EOMI. No discharge. Cardiovascular: No JVD.  RRR. + Murmur. Respiratory: Normal effort.  No stridor.  Bilateral clear to auscultation. GI: Non-distended.  BS +. Skin: Warm and dry.  Intact. Psych: Flat.  Normal behavior. Musc: Lower extremity edema. No tenderness in extremities. Neuro: Alert Extremely  HOH Motor: Limited due to ability to follow commands-RUE: 5/5 proximal distal RLE: 4 -/5 proximal to distal LUE: 4 -/5 proximal distal LLE:?  3/5 proximal distal (limited due to participation), appears stable  Assessment/Plan: 1. Functional deficits which require 3+ hours per day of interdisciplinary therapy in a comprehensive inpatient rehab setting.  Physiatrist is providing close team supervision and 24 hour management of active medical problems listed below.  Physiatrist and rehab team continue to assess barriers to discharge/monitor patient progress toward functional and medical goals  Care Tool:  Bathing  Bathing activity did not occur: Refused Body parts bathed by patient: Face   Body parts bathed by helper: Buttocks, Right lower leg, Left lower leg, Left upper leg, Right upper leg Body parts n/a: Right arm, Left arm, Chest, Abdomen, Front perineal area, Buttocks, Right upper leg, Left upper leg, Right lower leg, Left lower leg (did't attempt this session)   Bathing assist Assist Level: Minimal Assistance - Patient > 75%     Upper Body Dressing/Undressing Upper body dressing   What is the patient wearing?: Pull over shirt    Upper body assist Assist Level: Maximal Assistance - Patient 25 - 49%    Lower Body Dressing/Undressing Lower body dressing      What is the patient wearing?: Pants     Lower body assist Assist for lower body dressing: Total Assistance - Patient < 25%     Toileting Toileting    Toileting assist Assist for toileting: Total Assistance - Patient < 25%  Transfers Chair/bed transfer  Transfers assist     Chair/bed transfer assist level: Dependent - mechanical lift (stedy)     Locomotion Ambulation   Ambulation assist   Ambulation activity did not occur: Safety/medical concerns          Walk 10 feet activity   Assist  Walk 10 feet activity did not occur: Safety/medical concerns        Walk 50 feet  activity   Assist           Walk 150 feet activity   Assist Walk 150 feet activity did not occur: Safety/medical concerns         Walk 10 feet on uneven surface  activity   Assist Walk 10 feet on uneven surfaces activity did not occur: Safety/medical concerns         Wheelchair     Assist Will patient use wheelchair at discharge?: Yes Type of Wheelchair: Manual    Wheelchair assist level: Dependent - Patient 0% (attempts but unable to propel with UE's)      Wheelchair 50 feet with 2 turns activity    Assist        Assist Level: Dependent - Patient 0%   Wheelchair 150 feet activity     Assist      Assist Level: Dependent - Patient 0%    Medical Problem List and Plan: 1.Left side hemiparesis dysarthria/dysphagiasecondary to right MCA infarction due to right ICA high-grade stenosis status post angioplasty  Continue CIR  2. Antithrombotics: -DVT/anticoagulation:Per neurology stopping Xarelto  -antiplatelet therapy: Aspirin81 mg daily 3. Pain Management:Tylenol as needed.  4. Mood:Provide emotional support -antipsychotic agents: N/A 5. Neuropsych: This patientis notcapable of making decisions on hisown behalf. 6. Skin/Wound Care:Routine skin checks 7. Fluids/Electrolytes/Nutrition:Routine in and outs  8. Atrial fibrillation/tachybradycardia syndrome/pacemaker.   Continue Toprol25 mg daily 9. SCC of the carotid gland. Follow-up outpatient 10. Hypertension. Toprol 25 mg daily.   Labile on 11/27, monitor for trend 11. Hypothyroidism. Synthroid 12. Bacteremia.Completed Ancef 2 g every 8 hours 10-daycourse 13.  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 Dysphagia # 1 thins liquids. Follow-up speech therapy 15. CKD stage II/III. Baseline 1.30-1.48.   Creatinine 0.98 on 11/27 16. Acute blood loss anemia with history of GI bleed 2019. Patient transfused 1 unit packed red blood  cells 10/12/2020. Continue iron supplement. Protonix 40 mg twice daily.   Hemoglobin 8.8 on 11/22, labs ordered for Monday  17. Prediabetes. Hemoglobin A1c 5.7. SSI.   Controlled on 11/26  18. Leukocytosis:   WBCs 12.7 on 11/22, labs ordered for Monday   Afebrile   Continue to monitor  19.  Acute systolic congestive heart failure-not appropriate for any aggressive interventions   Lasix 20 daily per cardiology, appreciate recs Filed Weights   10/19/20 0432 10/20/20 0500 10/21/20 0500  Weight: 63.1 kg 65.5 kg 65.1 kg    Stable on 11/27  LOS: 7 days A FACE TO FACE EVALUATION WAS PERFORMED  Annibelle Brazie Lorie Phenix 10/21/2020, 2:11 PM

## 2020-10-22 ENCOUNTER — Inpatient Hospital Stay (HOSPITAL_COMMUNITY): Payer: PPO

## 2020-10-22 LAB — CBC
HCT: 29.5 % — ABNORMAL LOW (ref 39.0–52.0)
Hemoglobin: 8.9 g/dL — ABNORMAL LOW (ref 13.0–17.0)
MCH: 24.2 pg — ABNORMAL LOW (ref 26.0–34.0)
MCHC: 30.2 g/dL (ref 30.0–36.0)
MCV: 80.2 fL (ref 80.0–100.0)
Platelets: 321 10*3/uL (ref 150–400)
RBC: 3.68 MIL/uL — ABNORMAL LOW (ref 4.22–5.81)
RDW: 23.4 % — ABNORMAL HIGH (ref 11.5–15.5)
WBC: 6.5 10*3/uL (ref 4.0–10.5)
nRBC: 0 % (ref 0.0–0.2)

## 2020-10-22 LAB — GLUCOSE, CAPILLARY
Glucose-Capillary: 102 mg/dL — ABNORMAL HIGH (ref 70–99)
Glucose-Capillary: 124 mg/dL — ABNORMAL HIGH (ref 70–99)
Glucose-Capillary: 104 mg/dL — ABNORMAL HIGH (ref 70–99)
Glucose-Capillary: 107 mg/dL — ABNORMAL HIGH (ref 70–99)

## 2020-10-22 NOTE — Progress Notes (Addendum)
   10/22/20 1613  Vitals  BP (!) 144/68  MAP (mmHg) 91  BP Location Left Arm  BP Method Automatic  Patient Position (if appropriate) Lying  Pulse Rate 62  MEWS COLOR  MEWS Score Color Green  Oxygen Therapy  SpO2 98 %  O2 Device Room Air  MEWS Score  MEWS Temp 0  MEWS Systolic 0  MEWS Pulse 0  MEWS RR 0  MEWS LOC 0  MEWS Score 0    Peri care given at this time. Pt appearing slightly more lethargic than previous assessment. He is responsive and alert. Pt states that he is tired. Will continue to monitor for abnormal VS and change in mental status.

## 2020-10-22 NOTE — Progress Notes (Addendum)
[facility] PHYSICAL MEDICINE & REHABILITATION PROGRESS NOTE   Subjective/Complaints: Patient seen sitting up in bed this morning.  He states he slept well overnight.  He states he is doing well.  He denies complaints.  He was seen by cardiology yesterday, notes reviewed-Cozaar initiated.  ROS: Limited due to cognition, but appears to deny CP, shortness of breath, nausea, vomiting, diarrhea.  Objective:   No results found. Recent Labs    10/22/20 0100  WBC 6.5  HGB 8.9*  HCT 29.5*  PLT 321   Recent Labs    10/21/20 0640  NA 140  K 4.0  CL 103  CO2 25  GLUCOSE 94  BUN 22  CREATININE 0.98  CALCIUM 8.2*    Intake/Output Summary (Last 24 hours) at 10/22/2020 1705 Last data filed at 10/22/2020 1231 Gross per 24 hour  Intake 370 ml  Output --  Net 370 ml     Pressure Injury 10/14/20 Buttocks Left Deep Tissue Pressure Injury - Purple or maroon localized area of discolored intact skin or blood-filled blister due to damage of underlying soft tissue from pressure and/or shear. Purple/red area small fluid filled  (Active)  10/14/20 1700  Location: Buttocks  Location Orientation: Left  Staging: Deep Tissue Pressure Injury - Purple or maroon localized area of discolored intact skin or blood-filled blister due to damage of underlying soft tissue from pressure and/or shear.  Wound Description (Comments): Purple/red area small fluid filled blister noted at center  Present on Admission: Yes    Physical Exam: Vital Signs Blood pressure (!) 144/68, pulse 62, temperature (!) 97.5 F (36.4 C), resp. rate 16, height 5\' 5"  (1.651 m), weight 65.1 kg, SpO2 98 %. Constitutional: No distress . Vital signs reviewed. HENT: Normocephalic.  Atraumatic. Eyes: EOMI. No discharge. Cardiovascular: No JVD.  RRR.  + Murmur. Respiratory: Normal effort.  No stridor.  Bilateral clear to auscultation. GI: Non-distended.  BS +. Skin: Warm and dry.  Buttock wound not examined today. Psych: Flat.   Slowed. Musc: Lower extremity edema.  No tenderness in extremities. Neuro: Alert Extremely HOH Motor: Limited due to ability to follow commands-RUE: 5/5 proximal distal RLE: 4 -/5 proximal to distal LUE: 4 -/5 proximal distal, appears unchanged LLE: ? 3/5 proximal distal (limited due to participation), appears unchanged  Assessment/Plan: 1. Functional deficits which require 3+ hours per day of interdisciplinary therapy in a comprehensive inpatient rehab setting.  Physiatrist is providing close team supervision and 24 hour management of active medical problems listed below.  Physiatrist and rehab team continue to assess barriers to discharge/monitor patient progress toward functional and medical goals  Care Tool:  Bathing  Bathing activity did not occur: Refused Body parts bathed by patient: Face   Body parts bathed by helper: Buttocks, Right lower leg, Left lower leg, Left upper leg, Right upper leg Body parts n/a: Right arm, Left arm, Chest, Abdomen, Front perineal area, Buttocks, Right upper leg, Left upper leg, Right lower leg, Left lower leg (did't attempt this session)   Bathing assist Assist Level: Minimal Assistance - Patient > 75%     Upper Body Dressing/Undressing Upper body dressing   What is the patient wearing?: Pull over shirt    Upper body assist Assist Level: Maximal Assistance - Patient 25 - 49%    Lower Body Dressing/Undressing Lower body dressing      What is the patient wearing?: Pants     Lower body assist Assist for lower body dressing: Total Assistance - Patient < 25%  Toileting Toileting    Toileting assist Assist for toileting: Total Assistance - Patient < 25%     Transfers Chair/bed transfer  Transfers assist     Chair/bed transfer assist level: Dependent - mechanical lift (stedy)     Locomotion Ambulation   Ambulation assist   Ambulation activity did not occur: Safety/medical concerns          Walk 10 feet  activity   Assist  Walk 10 feet activity did not occur: Safety/medical concerns        Walk 50 feet activity   Assist           Walk 150 feet activity   Assist Walk 150 feet activity did not occur: Safety/medical concerns         Walk 10 feet on uneven surface  activity   Assist Walk 10 feet on uneven surfaces activity did not occur: Safety/medical concerns         Wheelchair     Assist Will patient use wheelchair at discharge?: Yes Type of Wheelchair: Manual    Wheelchair assist level: Dependent - Patient 0% (attempts but unable to propel with UE's)      Wheelchair 50 feet with 2 turns activity    Assist        Assist Level: Dependent - Patient 0%   Wheelchair 150 feet activity     Assist      Assist Level: Dependent - Patient 0%    Medical Problem List and Plan: 1.Left side hemiparesis dysarthria/dysphagiasecondary to right MCA infarction due to right ICA high-grade stenosis status post angioplasty  Continue CIR  2. Antithrombotics: -DVT/anticoagulation:Per neurology stopping Xarelto  -antiplatelet therapy: Aspirin81 mg daily 3. Pain Management:Tylenol as needed.  4. Mood:Provide emotional support -antipsychotic agents: N/A 5. Neuropsych: This patientis notcapable of making decisions on hisown behalf. 6. Skin/Wound Care:Routine skin checks 7. Fluids/Electrolytes/Nutrition:Routine in and outs  8. Atrial fibrillation/tachybradycardia syndrome/pacemaker.   Continue Toprol25 mg daily  Rate controlled on 11/28 9. SCC of the carotid gland. Follow-up outpatient 10. Hypertension. Toprol 25 mg daily.   Cozaar 25 started on 11/27  Labile on 11/28, monitor for trend 11. Hypothyroidism. Synthroid 12. Bacteremia.Completed Ancef 2 g every 8 hours 10-daycourse 13.  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 Dysphagia # 1 thins liquids. Follow-up speech therapy 15. CKD stage  II/III. Baseline 1.30-1.48.   Creatinine 0.98 on 11/27 16. Acute blood loss anemia with history of GI bleed 2019. Patient transfused 1 unit packed red blood cells 10/12/2020. Continue iron supplement. Protonix 40 mg twice daily.   Hemoglobin 8.9 on 11/28  17. Prediabetes. Hemoglobin A1c 5.7. SSI.   Slightly labile, but relatively controlled on 11/28  18. Leukocytosis: Resolved   WBCs 6.5 on 11/28   Afebrile   Continue to monitor  19.  Acute systolic congestive heart failure-not appropriate for any aggressive interventions   Lasix 20 daily per cardiology, appreciate recs Filed Weights   10/20/20 0500 10/21/20 0500 10/22/20 0500  Weight: 65.5 kg 65.1 kg 65.1 kg    Stable on 11/28  LOS: 8 days A FACE TO FACE EVALUATION WAS PERFORMED  Aiesha Leland Lorie Phenix 10/22/2020, 5:05 PM

## 2020-10-22 NOTE — Progress Notes (Addendum)
Pt noted to be coughing with liquids, thin consistency. LS clear at this time. VSS. Cough sounds wet/congested. Per MD, will order CXR and make NPO at this time until can be re-evaluated per speech.   Pt son made aware.

## 2020-10-22 NOTE — Progress Notes (Addendum)
Brief Note:  Informed by nursing regarding concerns for coughing and increase  In lethargy, ?aspiration with thin liquids.  Patient made NPO until AM for further evaluation.  CXR ordered. CXR showing some improvement.  Okay to give meds in apple sauce.

## 2020-10-23 ENCOUNTER — Inpatient Hospital Stay (HOSPITAL_COMMUNITY): Payer: PPO | Admitting: Speech Pathology

## 2020-10-23 ENCOUNTER — Inpatient Hospital Stay (HOSPITAL_COMMUNITY): Payer: PPO

## 2020-10-23 LAB — GLUCOSE, CAPILLARY
Glucose-Capillary: 100 mg/dL — ABNORMAL HIGH (ref 70–99)
Glucose-Capillary: 97 mg/dL (ref 70–99)
Glucose-Capillary: 97 mg/dL (ref 70–99)
Glucose-Capillary: 103 mg/dL — ABNORMAL HIGH (ref 70–99)

## 2020-10-23 MED ORDER — RIVAROXABAN 15 MG PO TABS
15.0000 mg | ORAL_TABLET | Freq: Every day | ORAL | Status: DC
  Administered 2020-10-23 – 2020-11-10 (×18): 15 mg via ORAL
  Filled 2020-10-23 (×20): qty 1

## 2020-10-23 NOTE — Progress Notes (Addendum)
Progress Note  Patient Name: Fernando [name] Date of Encounter: 10/23/2020  [facility] Cardiologist: Cristopher Peru, MD   Subjective   No chest pain or shortness of breath. Complains of neck pain and rough night. Grand son at beside.   Inpatient Medications    Scheduled Meds: . aspirin EC  81 mg Oral Daily  . atorvastatin  20 mg Oral Daily  . chlorhexidine  15 mL Mouth Rinse BID  . ferrous sulfate  325 mg Oral TID WC  . furosemide  20 mg Oral Daily  . levothyroxine  50 mcg Oral Q0600  . losartan  25 mg Oral Daily  . mouth rinse  15 mL Mouth Rinse q12n4p  . metoprolol succinate  25 mg Oral Daily  . pantoprazole sodium  40 mg Oral BID  . Rivaroxaban  15 mg Oral Q supper   Continuous Infusions:  PRN Meds: acetaminophen **OR** acetaminophen, cloNIDine, loperamide   Vital Signs    Vitals:   10/22/20 2358 10/23/20 0500 10/23/20 0626 10/23/20 0955  BP: (!) 163/67  (!) 170/63 (!) 134/47  Pulse: 63  64 66  Resp:      Temp: 97.8 F (36.6 C)  97.8 F (36.6 C)   TempSrc:   Oral   SpO2: 98%  95%   Weight:  64 kg    Height:        Intake/Output Summary (Last 24 hours) at 10/23/2020 1201 Last data filed at 10/23/2020 0800 Gross per 24 hour  Intake 270 ml  Output --  Net 270 ml   Last 3 Weights 10/23/2020 10/22/2020 10/21/2020  Weight (lbs) 141 lb 1.5 oz 143 lb 8.3 oz 143 lb 8.3 oz  Weight (kg) 64 kg 65.1 kg 65.1 kg      Telemetry    N/A  ECG    N/A  Physical Exam   GEN: Elderly frail male in no acute distress.   Neck: No JVD Cardiac: Irregular, no murmurs, rubs, or gallops.  Respiratory: Clear to auscultation bilaterally. GI: Soft, nontender, non-distended  MS: Trace edema; No deformity. Neuro:  Nonfocal  Psych: soft, age appropriate response   Labs    Chemistry Recent Labs  Lab 10/19/20 1059 10/21/20 0640  NA 141 140  K 3.3* 4.0  CL 105 103  CO2 27 25  GLUCOSE 118* 94  BUN 21 22  CREATININE 0.99 0.98  CALCIUM 7.9* 8.2*  GFRNONAA >60  >60  ANIONGAP 9 12     Hematology Recent Labs  Lab 10/22/20 0100  WBC 6.5  RBC 3.68*  HGB 8.9*  HCT 29.5*  MCV 80.2  MCH 24.2*  MCHC 30.2  RDW 23.4*  PLT 321    Radiology    DG Chest 2 View  Result Date: 10/22/2020 CLINICAL DATA:  Cough EXAM: CHEST - 2 VIEW COMPARISON:  10/15/2020 FINDINGS: Cardiac shadow is enlarged but stable. Aortic calcifications are again seen. Pacing device is again noted. Central vascular congestion is again seen but improved from the prior study. Small effusions are noted bilaterally. No bony abnormality is seen. IMPRESSION: Vascular congestion and small effusions consistent with mild CHF although somewhat improved from the prior exam. Electronically Signed   By: Inez Catalina M.D.   On: 10/22/2020 22:05   Cardiac Studies   TEE 10/10/20 1. Akinesis of the distal septum and apex; overall mild LV dysfunction;  no vegetations.  2. Left ventricular ejection fraction, by estimation, is 45 to 50%. The  left ventricle has mildly decreased function. The  left ventricle  demonstrates regional wall motion abnormalities (see scoring  diagram/findings for description).  3. Right ventricular systolic function is normal. The right ventricular  size is normal.  4. Left atrial size was severely dilated. No left atrial/left atrial  appendage thrombus was detected.  5. Right atrial size was moderately dilated.  6. The mitral valve is abnormal. Mild mitral valve regurgitation.  7. Tricuspid valve regurgitation is moderate to severe.  8. The aortic valve is tricuspid. Aortic valve regurgitation is mild.  9. There is Moderate (Grade III) plaque involving the descending aorta.  Echo 10/07/20 1. Akinesis of the distal septum and apex; overall mildly reduced LV  function; no thrombus noted using definity.  2. Left ventricular ejection fraction, by estimation, is 40 to 45%. The  left ventricle has mildly decreased function. The left ventricle  demonstrates  regional wall motion abnormalities (see scoring  diagram/findings for description). Left ventricular  diastolic function could not be evaluated.  3. Right ventricular systolic function is normal. The right ventricular  size is normal.  4. Left atrial size was severely dilated.  5. Right atrial size was severely dilated.  6. The mitral valve is normal in structure. Mild mitral valve  regurgitation. No evidence of mitral stenosis.  7. Tricuspid valve regurgitation is moderate.  8. The aortic valve is tricuspid. Aortic valve regurgitation is mild.  Mild aortic valve stenosis.  9. The inferior vena cava is normal in size with greater than 50%  respiratory variability, suggesting right atrial pressure of 3 mmHg  Patient Profile     86 y.o. male with historytachy-brady syndrome s/p pacemakerplacement, permanent aib, carotid stenosis s/p L CEA and high grade proximal right carotid stenosis, recent TIA with left hemiparehsis and expressive aphasia in August 2021, dyslipidemia, HTN, osteoarthritis, remote squamous cell carcinoma who admitted 11/12-11/20 with right MCA stroke, strep agalactiae bacteremia, and new cardiomyopathy with LVEF 40-45%. Hospital course complicated by GI bleed and anemia s/p transfusion requiring holding of anticoagulation.   Assessment & Plan    1. Permanent atrial fibrillation - Rate controlled. Intermittently held Xarelto due to GI bleed. Hgb stable. Plan to restart Xarelto today per note. Follow hemoglobin closely >> if recurrent bleeding then need to discussed risk of stroke vs bleeding. Brief discussion with grandson today.   2. Anemia s/p transfusion - As above  3. Chronic systolic CHF - No plan for ischemic work up - Continue Toprol XL and Losartan - appears euvolemic   [facility] will sign off.   Medication Recommendations:  Continue current medical therapy  Other recommendations (labs, testing, etc):  None Follow up as an outpatient: Has  appointment with Dr. Lovena Le 10/27/20 @ 3:15pm, if still in rehab will reschedule   For questions or updates, please contact [place] Please consult [email or web address] for contact info under        Jarrett Soho, PA  10/23/2020, 12:01 PM    Patient seen and examined with [facility].  Agree as above, with the following exceptions and changes as noted below. CV: iRRR, no murmurs, Lungs: clear, Abd: soft, Extrem: trace edema, Neuro/Psych: alert in chair, appropriate speech. All available labs, radiology testing, previous records reviewed.  Primary service plans to cautiously resume Xarelto, previously held due to GI bleed.  Hemoglobin stable as compared to 10/16/2020.  Would trend daily.  Elouise Munroe, MD 10/23/20 3:14 PM

## 2020-10-23 NOTE — Progress Notes (Signed)
[place] for Xarelto Indication: atrial fibrillation  No Known Allergies  Patient Measurements: Height: 5\' 5"  (165.1 cm) Weight: 64 kg (141 lb 1.5 oz) IBW/kg (Calculated) : 61.5  Vital Signs: Temp: 97.8 F (36.6 C) (11/29 0626) Temp Source: Oral (11/29 0626) BP: 170/63 (11/29 0626) Pulse Rate: 64 (11/29 0626)  Labs: Recent Labs    10/21/20 0640 10/22/20 0100  HGB  --  8.9*  HCT  --  29.5*  PLT  --  321  CREATININE 0.98  --     Estimated Creatinine Clearance: 42.7 mL/min (by C-G formula based on SCr of 0.98 mg/dL).   Medical History: Past Medical History:  Diagnosis Date  . Arthritis   . Atrial fibrillation ([place])   . Carotid artery disease ([place])    Dr. Kellie Simmering - s/p left CEA  . Coronary artery disease   . Essential hypertension, benign   . Hyperlipidemia   . 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 This mass was initially felt to be 1.7 cm at the time of presentation but had enlarged significantly to 4 cm clinically at the time of surgery. Pathologically it was 3.4 cm. A lymph node deep to the parotid was enlarged clinically at the time of surgery but was negative   . Tachycardia-bradycardia syndrome ([place])     Medications:  Scheduled:  . aspirin EC  81 mg Oral Daily  . atorvastatin  20 mg Oral Daily  . chlorhexidine  15 mL Mouth Rinse BID  . ferrous sulfate  325 mg Oral TID WC  . furosemide  20 mg Oral Daily  . levothyroxine  50 mcg Oral Q0600  . losartan  25 mg Oral Daily  . mouth rinse  15 mL Mouth Rinse q12n4p  . metoprolol succinate  25 mg Oral Daily  . pantoprazole sodium  40 mg Oral BID  . Rivaroxaban  15 mg Oral Q supper    Assessment: 86 yo M on Xarelto PTA for hx afib.  Xarelto was held in the setting of GIB / anemia (last dose 11/17).  Pt was on Lovenox for VTE ppx in  the interim (last dose 11/28).  Hgb has remained stable ~ 8.8-8.9.  Pharmacy has been consulted to resume Xarelto.  Of note, patient is on reduced dose Xarelto for CrCl <50.  CBC ordered for 12/1.  Goal of Therapy:  Therapeutic Anticoagulation Monitor platelets by anticoagulation protocol: Yes   Plan:  Xarelto 15 mg PO daily with supper Follow-up CBC 10/25/20  Karinna Beadles, Pharm.D., BCPS Clinical Pharmacist Clinical phone for 10/23/2020 from 8:30-4:00 is [phone].  **Pharmacist phone directory can be found on [email or web address] listed under [place].  10/23/2020 8:59 AM

## 2020-10-23 NOTE — Progress Notes (Signed)
Occupational Therapy Session Note  Patient Details  Name: Fernando [name] MRN: [id number] Date of Birth: [birth year]  Today's Date: 10/23/2020 OT Individual Time: 4920-1007 OT Individual Time Calculation (min): 71 min    Short Term Goals: Week 2:  OT Short Term Goal 1 (Week 2): Pt will don shirt with mod A OT Short Term Goal 2 (Week 2): Pt will complete self feeding with LRAD with min A OT Short Term Goal 3 (Week 2): Pt will complete sit to stand for progressing of LB selfcare with max assist. OT Short Term Goal 4 (Week 2): Pt will perform squat pivot transfer to the drop arm commode with max assist.  Skilled Therapeutic Interventions/Progress Updates:    Pt received semi-reclined in bed with bed alarm engaged, agreeable to therapy. Session focus on full-body dressing, UB bathing, grooming, and self-feeding. Donned voice amplifier with total A from therapist as provided by SLP. At bed level, donned pants, Ted hose, and gripper socks with total A. Pt completed side rolling L to R with min A for hand placement on bed rail and initiation of movement. Completed sup<>sit EOB with max A to move BLE off bed and to lift trunk. Seated EOB, pt bathed UB with overall mod A. Req multimodal cues (tactile, visual) + simple verbal directional cues to initiate UB bathing. Pt able to use LUE to wet wash cloth, open soap, and place soap on wash cloth. Pt bathed L side of UB using RUE with supervision, req mod A to bath R UB using LUE in order to lift LUE and for thoroughness of task. Pt then donned shirt using hemi technique req mod A to thread head and pull-shirt down.  Sit<>stand with stedy with mod A for initial lift and to maintain upright posture. Once in w/c, pt donned dentures with setup A and brushed hair with comb with supervision. MD present during partial session, removing NPO order. Pt then drank from cup with lid/straw with supervision using BUE. Then self-fed ice-cream using LUE as stabilizer and RUE to  self-feed with spoon + setup A. Pt continues to dem SOB with activity, spO2 levels remained at 99-100% throughout session. Overall, demonstrating continued improvement in BUE functional strength and fine-motor skills to complete ADLs. Pt left in w/c with BUE supported by pillows to decrease bilat edema and to therapeutically position involved LUE, seat-belt alarm engaged, and adapted call-bell in reach.  Therapy Documentation Precautions:  Precautions Precautions: ICD/Pacemaker, Fall Precaution Comments: L sided weakness; very HOH; dysarthria Restrictions Weight Bearing Restrictions: No  Pain: Pain Assessment Pain Scale: 0-10 Pain Score: 0-No pain Faces Pain Scale: No hurt Pain Type: Chronic pain Pain Location: Back Pain Orientation: Lower Pain Descriptors / Indicators: Aching Pain Frequency: Occasional Pain Onset: On-going Patients Stated Pain Goal: 0 Pain Intervention(s): Medication (See eMAR) Multiple Pain Sites: No   ADL: See care tool for more details.  Therapy/Group: Individual Therapy  Curtis Sites, OTR/L 10/23/2020, 12:09 PM

## 2020-10-23 NOTE — Progress Notes (Signed)
Physical Therapy Weekly Progress Note  Patient Details  Name: Fernando [name] MRN: [id number] Date of Birth: 06-14-1927  Beginning of progress report period: October 15, 2020 End of progress report period: October 23, 2020    Patient has met 2 of 3 short term goals.  Patient is making slow, but steady progress toward his goals. He remains limited by poor activity tolerance/endurance, decreased functional strength globally, poor sitting/standing balance. He has progressed to gait training this week and is able to walk a max of 81f before requiring extended seated rest break due to fatigue and SOB. Patient requiring less assist to complete all functional mobility, but does still requiring grossly ModA/MaxA depending on fatigue levels. Edema in B UE has subsided improving functional use of the extremities.   Patient continues to demonstrate the following deficits muscle weakness, decreased cardiorespiratoy endurance, unbalanced muscle activation, decreased coordination and decreased motor planning, decreased initiation, decreased attention, decreased awareness, decreased problem solving, decreased safety awareness, decreased memory and delayed processing and decreased sitting balance, decreased standing balance, decreased postural control and decreased balance strategies and therefore will continue to benefit from skilled PT intervention to increase functional independence with mobility.  Patient progressing toward long term goals..  Plan of care revisions: patient made 15/7 .  PT Short Term Goals Week 1:  PT Short Term Goal 1 (Week 1): Pt will be able to perform bed mobility with mod assist overall PT Short Term Goal 1 - Progress (Week 1): Met PT Short Term Goal 2 (Week 1): Pt will be able to perform bed <> chair transfer with mod assist PT Short Term Goal 2 - Progress (Week 1): Progressing toward goal PT Short Term Goal 3 (Week 1): Pt will be able to perform sit <> stands with mod +2 PT Short  Term Goal 3 - Progress (Week 1): Met Week 2:  PT Short Term Goal 1 (Week 2): Pt will be able to perform bed <> chair transfer with mod assist PT Short Term Goal 2 (Week 2): Patient will complete sit <> stand with MinA and LRAD PT Short Term Goal 3 (Week 2): Patient will ambulate >263fwith MinA, wc follow if needed, LRAD    Therapy Documentation Precautions:  Precautions Precautions: ICD/Pacemaker, Fall Precaution Comments: L sided weakness; very HOH; dysarthria Restrictions Weight Bearing Restrictions: No     JeDebbora Dus1/29/2021, 9:56 AM

## 2020-10-23 NOTE — Progress Notes (Signed)
[facility] PHYSICAL MEDICINE & REHABILITATION PROGRESS NOTE   Subjective/Complaints: Pt did not sleep well but did well with OT this am  Appreciate  Cardiology consult  No resp distress  ROS: Limited due to [place] (has pocket talker on )  Objective:   DG Chest 2 View  Result Date: 10/22/2020 CLINICAL DATA:  Cough EXAM: CHEST - 2 VIEW COMPARISON:  10/15/2020 FINDINGS: Cardiac shadow is enlarged but stable. Aortic calcifications are again seen. Pacing device is again noted. Central vascular congestion is again seen but improved from the prior study. Small effusions are noted bilaterally. No bony abnormality is seen. IMPRESSION: Vascular congestion and small effusions consistent with mild CHF although somewhat improved from the prior exam. Electronically Signed   By: Inez Catalina M.D.   On: 10/22/2020 22:05   Recent Labs    10/22/20 0100  WBC 6.5  HGB 8.9*  HCT 29.5*  PLT 321   Recent Labs    10/21/20 0640  NA 140  K 4.0  CL 103  CO2 25  GLUCOSE 94  BUN 22  CREATININE 0.98  CALCIUM 8.2*    Intake/Output Summary (Last 24 hours) at 10/23/2020 7035 Last data filed at 10/22/2020 1755 Gross per 24 hour  Intake 270 ml  Output --  Net 270 ml     Pressure Injury 10/14/20 Buttocks Left Deep Tissue Pressure Injury - Purple or maroon localized area of discolored intact skin or blood-filled blister due to damage of underlying soft tissue from pressure and/or shear. Purple/red area small fluid filled  (Active)  10/14/20 1700  Location: Buttocks  Location Orientation: Left  Staging: Deep Tissue Pressure Injury - Purple or maroon localized area of discolored intact skin or blood-filled blister due to damage of underlying soft tissue from pressure and/or shear.  Wound Description (Comments): Purple/red area small fluid filled blister noted at center  Present on Admission: Yes    Physical Exam: Vital Signs Blood pressure (!) 170/63, pulse 64, temperature 97.8 F (36.6 C),  temperature source Oral, resp. rate 16, height 5\' 5"  (1.651 m), weight 64 kg, SpO2 95 %.  General: No acute distress Mood and affect are appropriate Heart: Regular rate and rhythm no rubs murmurs or extra sounds Lungs: Clear to auscultation, breathing unlabored, no rales or wheezes Abdomen: Positive bowel sounds, soft nontender to palpation, nondistended Extremities: No clubbing, cyanosis, or edema   Neuro: Alert Extremely HOH Motor: Limited due to ability to follow commands-RUE: 5/5 proximal distal RLE: 4 -/5 proximal to distal LUE: 4 -/5 proximal distal, appears unchanged LLE: ? 3/5 proximal distal (limited due to participation), appears unchanged  Assessment/Plan: 1. Functional deficits which require 3+ hours per day of interdisciplinary therapy in a comprehensive inpatient rehab setting.  Physiatrist is providing close team supervision and 24 hour management of active medical problems listed below.  Physiatrist and rehab team continue to assess barriers to discharge/monitor patient progress toward functional and medical goals  Care Tool:  Bathing  Bathing activity did not occur: Refused Body parts bathed by patient: Face   Body parts bathed by helper: Buttocks, Right lower leg, Left lower leg, Left upper leg, Right upper leg Body parts n/a: Right arm, Left arm, Chest, Abdomen, Front perineal area, Buttocks, Right upper leg, Left upper leg, Right lower leg, Left lower leg (did't attempt this session)   Bathing assist Assist Level: Minimal Assistance - Patient > 75%     Upper Body Dressing/Undressing Upper body dressing   What is the patient wearing?: Pull  over shirt    Upper body assist Assist Level: Maximal Assistance - Patient 25 - 49%    Lower Body Dressing/Undressing Lower body dressing      What is the patient wearing?: Pants     Lower body assist Assist for lower body dressing: Total Assistance - Patient < 25%     Toileting Toileting    Toileting assist  Assist for toileting: Total Assistance - Patient < 25%     Transfers Chair/bed transfer  Transfers assist     Chair/bed transfer assist level: Dependent - mechanical lift (stedy)     Locomotion Ambulation   Ambulation assist   Ambulation activity did not occur: Safety/medical concerns          Walk 10 feet activity   Assist  Walk 10 feet activity did not occur: Safety/medical concerns        Walk 50 feet activity   Assist           Walk 150 feet activity   Assist Walk 150 feet activity did not occur: Safety/medical concerns         Walk 10 feet on uneven surface  activity   Assist Walk 10 feet on uneven surfaces activity did not occur: Safety/medical concerns         Wheelchair     Assist Will patient use wheelchair at discharge?: Yes Type of Wheelchair: Manual    Wheelchair assist level: Dependent - Patient 0% (attempts but unable to propel with UE's)      Wheelchair 50 feet with 2 turns activity    Assist        Assist Level: Dependent - Patient 0%   Wheelchair 150 feet activity     Assist      Assist Level: Dependent - Patient 0%    Medical Problem List and Plan: 1.Left side hemiparesis dysarthria/dysphagiasecondary to right MCA infarction due to right ICA high-grade stenosis status post angioplasty  Continue CIR  2. Antithrombotics: -DVT/anticoagulation:Per neurology stopping Xarelto  -antiplatelet therapy: Aspirin81 mg daily 3. Pain Management:Tylenol as needed.  4. Mood:Provide emotional support -antipsychotic agents: N/A 5. Neuropsych: This patientis notcapable of making decisions on hisown behalf. 6. Skin/Wound Care:Routine skin checks 7. Fluids/Electrolytes/Nutrition:Routine in and outs  8. Atrial fibrillation/tachybradycardia syndrome/pacemaker. Per Neuro and cardiology will resume Xarelto and monitor Hgb  Continue Toprol25 mg daily  Rate controlled on  11/28 9. SCC of the carotid gland. Follow-up outpatient 10. Hypertension. Toprol 25 mg daily.   Cozaar 25 started on 11/27  Labile on 11/28, monitor for trend 11. Hypothyroidism. Synthroid 12. Bacteremia.Completed Ancef 2 g every 8 hours 10-daycourse 13.  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 Dysphagia # 1 thins liquids. May resume diet, drank H20 for me through a straw without cough or wet voice 15. CKD stage II/III. Baseline 1.30-1.48.   Creatinine 0.98 on 11/27 16. Acute blood loss anemia with history of GI bleed 2019. Patient transfused 1 unit packed red blood cells 10/12/2020. Continue iron supplement. Protonix 40 mg twice daily.   Hemoglobin 8.9 on 11/28 stable vs 11/22 ok to resume Xarelto as rec per Neuro and cardio  17. Prediabetes. Hemoglobin A1c 5.7. SSI.   Slightly labile, but relatively controlled on 11/28   18.  Acute systolic congestive heart failure-not appropriate for any aggressive interventions, no resp distress or rales, appears compensated appreciate cardiology assist   Lasix 20 daily per cardiology, appreciate recs Filed Weights   10/21/20 0500 10/22/20 0500 10/23/20 0500  Weight: 65.1 kg 65.1  kg 64 kg    Stable on 11/28  LOS: 9 days A FACE TO FACE EVALUATION WAS PERFORMED  Charlett Blake 10/23/2020, 8:29 AM

## 2020-10-23 NOTE — Progress Notes (Signed)
Physical Therapy Session Note  Patient Details  Name: Fernando [name] MRN: [id number] Date of Birth: 1926-11-26  Today's Date: 10/23/2020 PT Individual Time: 1000-1113 PT Individual Time Calculation (min): 73 min   Short Term Goals: Week 1:  PT Short Term Goal 1 (Week 1): Pt will be able to perform bed mobility with mod assist overall PT Short Term Goal 2 (Week 1): Pt will be able to perform bed <> chair transfer with mod assist PT Short Term Goal 3 (Week 1): Pt will be able to perform sit <> stands with mod +2  Skilled Therapeutic Interventions/Progress Updates:   Patient received sitting up in wc, agreeable to PT. He denies pain. RN at bedside providing morning rx. PT propelling patient in wc to therapy gym for time management and energy conservation. Patient able to complete sit <> stand transfer with RW and ModA. He was able to remain standing for up to 35s, which is an improvement from previous sessions, but still demonstrates a significant lack of endurance and activity tolerance. O2 remaining >95% throughout entirety of session despite progressing mobility. HR 55-85BPM throughout session. Patient maintains slight posterior lean in standing, but is responsive to verbal cues for anterior weight and manual facilitation to initiate anterior weight shift. Patient requiring ModA for proper hand placement for sit <>stand transfers using RW. Patient ambulating the following distances with RW, ModA and wc follow for safety: 30ft, 38ft, 52ft, 27ft. He did require extended seated rest break between bouts of ambulation. Noted dyspnea, but appropriate oxygen saturation after each trial. Dyspnea resolved in less than 1 min. While ambulating, forward flexed posture which worsens with fatigue, posterior lean and very short B step length and minimal B foot clearance. Patient able to stand with RW to match playing cards. MinA needed to maintain safe dynamic standing balance with verbal cues for upright posture.  Patient able to engage B UE equally. 80% accurate with matching cards. Patient able to place 4 cards at a time before needing an extended seated rest break. PT educating patient on propelling wc using B UE. Initially, he required consistent hand over hand assist to B UE to complete strokes, but patient progressed to [place]. He continues to demonstrate limited ability to steer and short, inefficient strokes, but this is improving with repetition of this task. Patient returning to room in wc, seatbelt alarm on, call light within reach, B UE elevated and supported on pillows to assist with edema management. Grandson at bedside.      Therapy Documentation Precautions:  Precautions Precautions: ICD/Pacemaker, Fall Precaution Comments: L sided weakness; very HOH; dysarthria Restrictions Weight Bearing Restrictions: No    Therapy/Group: Individual Therapy  [name], PT, DPT, CBIS  10/23/2020, 7:47 AM

## 2020-10-23 NOTE — Progress Notes (Signed)
Speech Language Pathology Daily Session Note  Patient Details  Name: Fernando [name] MRN: [id number] Date of Birth: August 21, 1927  Today's Date: 10/23/2020 SLP Individual Time: 1215-1310 SLP Individual Time Calculation (min): 55 min  Short Term Goals: Week 1: SLP Short Term Goal 1 (Week 1): Pt will consume current diet with appreciable change in respiratory status or other vitals to indicate tolerance, and Mod A cues for use of compensatory swallow stategies. SLP Short Term Goal 2 (Week 1): Pt will consume trials of Dys 2 textures demonstrating efficinent mastication and oral clearance X3 prior to upgrade. SLP Short Term Goal 3 (Week 1): Pt will use compensatory strategies to increase speech intelligibility to 50% at the word level with Max A multimodal cues. SLP Short Term Goal 4 (Week 1): Pt will recall 1 safety precaution with Mod A cues for use of strategies and/or aids. SLP Short Term Goal 5 (Week 1): Pt will demonstrate ability to problem solve basic functional and familiar situations with Mod A cues. SLP Short Term Goal 6 (Week 1): Pt will identify 1 physical and 1 cognitive-linguistic deficit in setting of acute CVA with Mod A cues.  Skilled Therapeutic Interventions: Skilled treatment session focused on dysphagia and cognitive goals. Patient had been made NPO by nursing staff due to concerns of overt s/s of aspiration. However, the physician placed the patient back on his currently prescribed diet this morning. SLP facilitated session by providing skilled observation with lunch meal of Dys. 1 textures with thin liquids. Patient consumed meal with mildly prolonged AP transit and intermittent, subtle throat clearing that appeared consistent with patient's most recent MBS. No significant coughing episodes were noted but patient required rest breaks due to shortness of breath while eating. Recommend patient continue current diet with repeat MBS scheduled for tomorrow to assess swallow function.  SLP also facilitated session by providing Mod A verbal and visual cues to count money and Max A multimodal cues for basic problem solving during a basic money management task. Patient left upright in the wheelchair with alarm on and all needs within reach. Continue with current plan of care.      Pain Pain Assessment Pain Scale: 0-10 Pain Score: 0-No pain Faces Pain Scale: No hurt Pain Type: Chronic pain Pain Location: Back Pain Orientation: Lower Pain Descriptors / Indicators: Aching Pain Frequency: Occasional Pain Onset: On-going Patients Stated Pain Goal: 0 Pain Intervention(s): Medication (See eMAR) Multiple Pain Sites: No  Therapy/Group: Individual Therapy  Nyeisha Goodall, [place] 10/23/2020, 1:26 PM

## 2020-10-24 ENCOUNTER — Inpatient Hospital Stay (HOSPITAL_COMMUNITY): Payer: PPO

## 2020-10-24 ENCOUNTER — Inpatient Hospital Stay (HOSPITAL_COMMUNITY): Payer: PPO | Admitting: Physical Therapy

## 2020-10-24 ENCOUNTER — Encounter (HOSPITAL_COMMUNITY): Payer: PPO | Admitting: Speech Pathology

## 2020-10-24 LAB — GLUCOSE, CAPILLARY
Glucose-Capillary: 104 mg/dL — ABNORMAL HIGH (ref 70–99)
Glucose-Capillary: 102 mg/dL — ABNORMAL HIGH (ref 70–99)
Glucose-Capillary: 89 mg/dL (ref 70–99)
Glucose-Capillary: 105 mg/dL — ABNORMAL HIGH (ref 70–99)

## 2020-10-24 NOTE — Progress Notes (Addendum)
[place] PHYSICAL MEDICINE & REHABILITATION PROGRESS NOTE   Subjective/Complaints: Appreciate cardiology note (signed off)  Spoke with son at length this am regarding father's functional status as well as medical status and anticoagulation   ROS: Limited due to [place] (has pocket talker on )  Objective:   DG Chest 2 View  Result Date: 10/22/2020 CLINICAL DATA:  Cough EXAM: CHEST - 2 VIEW COMPARISON:  10/15/2020 FINDINGS: Cardiac shadow is enlarged but stable. Aortic calcifications are again seen. Pacing device is again noted. Central vascular congestion is again seen but improved from the prior study. Small effusions are noted bilaterally. No bony abnormality is seen. IMPRESSION: Vascular congestion and small effusions consistent with mild CHF although somewhat improved from the prior exam. Electronically Signed   By: Inez Catalina M.D.   On: 10/22/2020 22:05   Recent Labs    10/22/20 0100  WBC 6.5  HGB 8.9*  HCT 29.5*  PLT 321   No results for input(s): NA, K, CL, CO2, GLUCOSE, BUN, CREATININE, CALCIUM in the last 72 hours.  Intake/Output Summary (Last 24 hours) at 10/24/2020 0720 Last data filed at 10/23/2020 7564 Gross per 24 hour  Intake 330 ml  Output --  Net 330 ml     Pressure Injury 10/14/20 Buttocks Left Deep Tissue Pressure Injury - Purple or maroon localized area of discolored intact skin or blood-filled blister due to damage of underlying soft tissue from pressure and/or shear. Purple/red area small fluid filled  (Active)  10/14/20 1700  Location: Buttocks  Location Orientation: Left  Staging: Deep Tissue Pressure Injury - Purple or maroon localized area of discolored intact skin or blood-filled blister due to damage of underlying soft tissue from pressure and/or shear.  Wound Description (Comments): Purple/red area small fluid filled blister noted at center  Present on Admission: Yes    Physical Exam: Vital Signs Blood pressure (!) 148/75, pulse 62,  temperature 97.9 F (36.6 C), temperature source Oral, resp. rate 16, height 5\' 5"  (1.651 m), weight 64 kg, SpO2 98 %.  General: No acute distress Mood and affect are appropriate Heart: Regular rate and rhythm no rubs murmurs or extra sounds Lungs: Clear to auscultation, breathing unlabored, no rales or wheezes Abdomen: Positive bowel sounds, soft nontender to palpation, nondistended Extremities: No clubbing, cyanosis, or edema   Neuro: Alert Extremely HOH Motor: Limited due to ability to follow commands-RUE: 5/5 proximal distal RLE: 4 -/5 proximal to distal LUE: 4 -/5 proximal distal, appears unchanged LLE: ? 3/5 proximal distal (limited due to participation), appears unchanged  Assessment/Plan: 1. Functional deficits which require 3+ hours per day of interdisciplinary therapy in a comprehensive inpatient rehab setting.  Physiatrist is providing close team supervision and 24 hour management of active medical problems listed below.  Physiatrist and rehab team continue to assess barriers to discharge/monitor patient progress toward functional and medical goals  Care Tool:  Bathing  Bathing activity did not occur: Refused Body parts bathed by patient: Right arm, Left arm, Chest, Abdomen, Face   Body parts bathed by helper: Buttocks, Right lower leg, Left lower leg, Left upper leg, Right upper leg Body parts n/a: Front perineal area, Buttocks, Right upper leg, Left upper leg, Right lower leg, Left lower leg, Face (didn't attept LB bathing this session)   Bathing assist Assist Level: Moderate Assistance - Patient 50 - 74%     Upper Body Dressing/Undressing Upper body dressing   What is the patient wearing?: Pull over shirt    Upper body assist  Assist Level: Moderate Assistance - Patient 50 - 74%    Lower Body Dressing/Undressing Lower body dressing      What is the patient wearing?: Pants     Lower body assist Assist for lower body dressing: Total Assistance - Patient <  25%     Toileting Toileting    Toileting assist Assist for toileting: Total Assistance - Patient < 25%     Transfers Chair/bed transfer  Transfers assist     Chair/bed transfer assist level: Dependent - mechanical lift (stedy)     Locomotion Ambulation   Ambulation assist   Ambulation activity did not occur: Safety/medical concerns  Assist level: 2 helpers Assistive device: Walker-rolling Max distance: 15   Walk 10 feet activity   Assist  Walk 10 feet activity did not occur: Safety/medical concerns  Assist level: 2 helpers Assistive device: Walker-rolling   Walk 50 feet activity   Assist           Walk 150 feet activity   Assist Walk 150 feet activity did not occur: Safety/medical concerns         Walk 10 feet on uneven surface  activity   Assist Walk 10 feet on uneven surfaces activity did not occur: Safety/medical concerns         Wheelchair     Assist Will patient use wheelchair at discharge?: Yes Type of Wheelchair: Manual    Wheelchair assist level: Minimal Assistance - Patient > 75% Max wheelchair distance: 25    Wheelchair 50 feet with 2 turns activity    Assist        Assist Level: Moderate Assistance - Patient 50 - 74%   Wheelchair 150 feet activity     Assist      Assist Level: Moderate Assistance - Patient 50 - 74%    Medical Problem List and Plan: 1.Left side hemiparesis dysarthria/dysphagiasecondary to right MCA infarction due to right ICA high-grade stenosis status post angioplasty  Continue CIR  2. Antithrombotics: -DVT/anticoagulation:Per neurology stopping Xarelto  -antiplatelet therapy: Aspirin81 mg daily 3. Pain Management:Tylenol as needed.  4. Mood:Provide emotional support -antipsychotic agents: N/A 5. Neuropsych: This patientis notcapable of making decisions on hisown behalf. 6. Skin/Wound Care:Routine skin checks 7.  Fluids/Electrolytes/Nutrition:Routine in and outs  8. Atrial fibrillation/tachybradycardia syndrome/pacemaker. Per Neuro and cardiology will resume Xarelto and monitor Hgb  Continue Toprol25 mg daily  Rate controlled on 11/28 9. SCC of the carotid gland. Follow-up outpatient 10. Hypertension. Toprol 25 mg daily.   Cozaar 25 started on 11/27  Labile on 11/28, monitor for trend 11. Hypothyroidism. Synthroid 12. Bacteremia.Completed Ancef 2 g every 8 hours 10-daycourse 13.  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 Dysphagia # 1 thins liquids. May resume diet, drank H20 for me through a straw without cough or wet voice 15. CKD stage II/III. Baseline 1.30-1.48.   Creatinine 0.98 on 11/27 16. Acute blood loss anemia with history of GI bleed 2019. Patient transfused 1 unit packed red blood cells 10/12/2020. Continue iron supplement. Protonix 40 mg twice daily.   Hemoglobin 8.9 on 11/28 stable vs 11/22 ok to resume Xarelto as rec per Neuro and cardio  17. Prediabetes. Hemoglobin A1c 5.7. SSI.   Slightly labile, but relatively controlled on 11/28   18. Chronic  systolic congestive heart failure-compensated, cards has signed off   Lasix 20 daily per cardiology, appreciate recs Filed Weights   10/21/20 0500 10/22/20 0500 10/23/20 0500  Weight: 65.1 kg 65.1 kg 64 kg    Stable on 11/30  LOS: 10 days A FACE TO FACE EVALUATION WAS PERFORMED  Charlett Blake 10/24/2020, 7:20 AM

## 2020-10-24 NOTE — Progress Notes (Signed)
Physical Therapy Session Note  Patient Details  Name: Regnald Bowens MRN: [id number] Date of Birth: 1927-07-30  Today's Date: 10/24/2020 PT Individual Time: 1130-1200 PT Individual Time Calculation (min): 30 min   Short Term Goals: Week 1:  PT Short Term Goal 1 (Week 1): Pt will be able to perform bed mobility with mod assist overall PT Short Term Goal 1 - Progress (Week 1): Met PT Short Term Goal 2 (Week 1): Pt will be able to perform bed <> chair transfer with mod assist PT Short Term Goal 2 - Progress (Week 1): Progressing toward goal PT Short Term Goal 3 (Week 1): Pt will be able to perform sit <> stands with mod +2 PT Short Term Goal 3 - Progress (Week 1): Met Week 2:  PT Short Term Goal 1 (Week 2): Pt will be able to perform bed <> chair transfer with mod assist PT Short Term Goal 2 (Week 2): Patient will complete sit <> stand with MinA and LRAD PT Short Term Goal 3 (Week 2): Patient will ambulate >83f with MinA, wc follow if needed, LRAD Week 3:     Skilled Therapeutic Interventions/Progress Updates:    PAIN denies pain this am  Pt initially supine but agreeable to session. Supine to sit w/mod assist of 1 and cues for sequencing Sit to stand w/mod assist and gait as follows: 542fx 2, 1081f 1, 7ft74f1.  Pt w/heavily flexed posture which iproved slightly w/repeated efforts. Pt requires extended rest breaks due to dyspnea w/exertion. Spt wc to bed w/mod assist w/rw Sit to supine w/mod assist.  Son at bedside and encourages pt, expressed pleasure w/pt care team and progress. Pt left supine w/rails up x 4, alarm set, bed in lowest position, and needs in reach.  Therapy Documentation Precautions:  Precautions Precautions: ICD/Pacemaker, Fall Precaution Comments: L sided weakness; very HOH; dysarthria Restrictions Weight Bearing Restrictions: No    Therapy/Group: Individual Therapy  BarbCallie Fielding  [place]/2021, 4:31 PM

## 2020-10-24 NOTE — Progress Notes (Signed)
Physical Therapy Session Note  Patient Details  Name: Fernando [name] MRN: [id number] Date of Birth: 1927/10/25  Today's Date: 10/24/2020 PT Individual Time: 7824-2353 PT Individual Time Calculation (min): 59 min   Short Term Goals: Week 2:  PT Short Term Goal 1 (Week 2): Pt will be able to perform bed <> chair transfer with mod assist PT Short Term Goal 2 (Week 2): Patient will complete sit <> stand with MinA and LRAD PT Short Term Goal 3 (Week 2): Patient will ambulate >58ft with MinA, wc follow if needed, LRAD  Skilled Therapeutic Interventions/Progress Updates:   Pt received sitting in w/c with his son present and pt agreeable to therapy session. Therapist, pt, and his son discused home set-up with therapy POC and LTGs: pt's son reports he is considering purchasing a temporary ramp for home entry so that pt doesn't have to navigate stairs (otherwise has 5STE backdoor porch followed by 1 step-up into the home) - therapist recommended having a ramped entrance; also discussed having pt use a different room in the home that will allow level ground ambulation as opposed to having to step up/down a step inside the home to get to/from bedroom - pt's son very receptive to these conversations and is eager to ensure his home is set-up safely for the patient. Pt's son is planning to confirm with his sister what DME the pt already has access to versus what will need to be ordered.  Transported to/from gym in w/c for time management and energy conservation. Sit>stand w/c>RW with heavy mod assist for lifting into stand when pt noted to be soiled. Transported back to room. L stand pivot w/c>toilet using B UE support on grabbar with heavy mod assist for lifting into stand and pivoting - +2 total assist LB clothing management - pt incontinent of bladder and bowels, further continent void on toilet. Sit>stand toilet>L UE support on grab bar with heavy mod progressed to heavy max assist to come to stand due to  increasing fatigue (requiring multiple attempts to come to stand) as pt unable to tolerate standing long enough for +2 assist to complete peri-care due to impaired cardiopulmonary endurance. R squat pivot toilet>w/c with heavy max assist for lifting/pivoting hips and pt using UE support on grab bar and w/c armrests as needed. Pt still required assist for additional peri-care and LB clothing management but transitioned to w/c to allow pt to use B UEs to pull up on grab bar to increase ability to come to stand and standing tolerance as pt becoming very fatigued. Sit>stand w/c>B UE support on grab bar x2 with max assist for lifting to stand and min assist for balance while +2 assist completed total assist peri-care and LB clothing management, RN present to change sacral dressing and assess skin - of note, pt was resting his forehead on the wall while standing. Therapist retrieved Roho cushion and provided to pt for increased pressure relief for improved skin integrity. Pt requesting for assist back to bed. R squat pivot w/c>EOB with heavy max assist of 1 and +2 min assist for lifting/pivoting hips as pt becoming increasingly fatigued requiring increasing assistance. Pt continues to display dyspnea/SOB with activity requiring frequent seated rest breaks. Sit>supine with max assist for B LE management into bed. +2 total assist scoot towards HOB. Pt left supine in bed in the care of RN.  Therapy Documentation Precautions:  Precautions Precautions: ICD/Pacemaker, Fall Precaution Comments: L sided weakness; very HOH; dysarthria Restrictions Weight Bearing Restrictions: No  Pain: Pt  continues to state that he feels "awful", pt's son reports that he often says that at home as well; despite this, pt agreeable to therapy session and it does not limit his participation.  Therapy/Group: Individual Therapy  Tawana Scale , PT, DPT, CSRS  10/24/2020, 12:35 PM

## 2020-10-24 NOTE — Progress Notes (Signed)
Occupational Therapy Session Note  Patient Details  Name: Fernando [name] MRN: [id number] Date of Birth: [birth year]  Today's Date: 10/24/2020 OT Individual Time: 5284-1324 OT Individual Time Calculation (min): 70 min    Short Term Goals: Week 2:  OT Short Term Goal 1 (Week 2): Pt will don shirt with mod A OT Short Term Goal 2 (Week 2): Pt will complete self feeding with LRAD with min A OT Short Term Goal 3 (Week 2): Pt will complete sit to stand for progressing of LB selfcare with max assist. OT Short Term Goal 4 (Week 2): Pt will perform squat pivot transfer to the drop arm commode with max assist.  Skilled Therapeutic Interventions/Progress Updates:     Pt received semi-reclined in bed with son present, agreeable to therapy. Session focus on self-feeding with BUE, LB dressing, functional mobility, grooming, oral care, and pt/family education re: home set-up + energy conservation. Pt cont to req min A for rolling R to L for hand placement and bending BLE. Pt is total A to don pants at bed level. Sup<>sit EOB with max A to lift trunk and move BLE off bed. Maintains static sitting balance with close supervision d/t decreased functional strength. Cont to exhibit SOB + labored breathing with activity but spO2 levels read at 99-100% throughout session. Completed stand-pivot transfer to w/c with RW + mod A for initial lift and to weight shift on LLE. Pt cont to demonstrate improvement with functional use of BLE d/t decrease bilat edema. Pt washed face + inserted dentures seated in w/c at sink with setup A . Self-fed 50% breakfast with dominant LUE with close supervision to min A to scoop food on spoon. Therapist fed pt remaining meal 2/2 energy conservation. Drinks from cup with BUE with supervision to min A to control beverage flow if not using straw. Pt's son present throughout session. Edu son on energy conservation for self-feeding (smaller meals throughout the day) and bathroom set-up (therapist  demonstrated TTB t/f and edu on where to purchase). Pt left in w/c with chair alarm belt engaged, LUE supported by pillow, son and NT present.    Therapy Documentation Precautions:  Precautions Precautions: ICD/Pacemaker, Fall Precaution Comments: L sided weakness; very HOH; dysarthria Restrictions Weight Bearing Restrictions: No  Pain: Pain Assessment Pain Scale: 0-10 Pain Score: 0-No pain Faces Pain Scale: No hurt Pain Type: Chronic pain Pain Location: Back Pain Orientation: Lower Pain Descriptors / Indicators: Aching Pain Frequency: Occasional Pain Onset: On-going Patients Stated Pain Goal: 4 Pain Intervention(s): Medication (See eMAR) Multiple Pain Sites: No   ADL: See Care Tool for more details.    Therapy/Group: Individual Therapy  Volanda Napoleon MS, OTR/L  10/24/2020, 10:29 AM

## 2020-10-24 NOTE — Progress Notes (Signed)
Speech Language Pathology Weekly Progress Note  Patient Details  Name: Fernando [name] MRN: [id number] Date of Birth: 21-Mar-1927  Beginning of progress report period: October 16, 2020 End of progress report period: October 24, 2020  Short Term Goals: Week 1: SLP Short Term Goal 1 (Week 1): Pt will consume current diet with appreciable change in respiratory status or other vitals to indicate tolerance, and Mod A cues for use of compensatory swallow stategies. SLP Short Term Goal 1 - Progress (Week 1): Met SLP Short Term Goal 2 (Week 1): Pt will consume trials of Dys 2 textures demonstrating efficinent mastication and oral clearance X3 prior to upgrade. SLP Short Term Goal 2 - Progress (Week 1): Progressing toward goal SLP Short Term Goal 3 (Week 1): Pt will use compensatory strategies to increase speech intelligibility to 50% at the word level with Max A multimodal cues. SLP Short Term Goal 3 - Progress (Week 1): Met SLP Short Term Goal 4 (Week 1): Pt will recall 1 safety precaution with Mod A cues for use of strategies and/or aids. SLP Short Term Goal 4 - Progress (Week 1): Progressing toward goal SLP Short Term Goal 5 (Week 1): Pt will demonstrate ability to problem solve basic functional and familiar situations with Mod A cues. SLP Short Term Goal 5 - Progress (Week 1): Met SLP Short Term Goal 6 (Week 1): Pt will identify 1 physical and 1 cognitive-linguistic deficit in setting of acute CVA with Mod A cues. SLP Short Term Goal 6 - Progress (Week 1): Progressing toward goal    New Short Term Goals: Week 2: SLP Short Term Goal 1 (Week 2): Pt will use compensatory swallowing strategies with current diet with Min A verbal cues. SLP Short Term Goal 2 (Week 2): Pt will demonstrate efficient mastication, oral clearance, and no decline in respiratory status when cosuming trials of Dys 2 textures across at least 2 session prior to upgrade. SLP Short Term Goal 3 (Week 2): Pt will use compensatory  strategies to increase speech intelligibility to 75% at the phrase level with Mod A multimodal cues. SLP Short Term Goal 4 (Week 2): Pt will recall 1 safety precaution with Mod A cues for use of strategies and/or aids. SLP Short Term Goal 5 (Week 2): Pt will identify 1 physical and 1 cognitive-linguistic deficit in setting of acute CVA with Mod A cues. SLP Short Term Goal 6 (Week 2): Pt will demonstrate ability to problem solve basic functional and familiar situations with Min A cues.  Weekly Progress Updates: Pt has made functional gains and met 3 out of 6 short term goals. Pt is currently Mod-Max assist for basic familiar tasks due to moderate dysarthria impacting his voice and articulation, reducing his speech intelligibility, as well as cognitive impairments impacting his intellectual awareness, short term recall, and problem solving skills. In addition, pt is hard of hearing, which further impacts his ability to follow directions and overall functional status - voice amplifier is being utilized by therapies and other staff to aid in best auditory comprehension. Pt is consuming a dys 1 (puree) texture diet with thin liquids and requires Mod A for use of his compensatory swallow strategies. MBSS was repeated this week (10/24/20) due to staff concerns for increase in coughing at bedside, however revealed oropharyngeal swallow functional largely unchanged and functional with current diet. Therefore, no changes were made to diet at this time. Pt and family education is ongoing. Pt would continue to benefit from skilled ST while inpatient in order  to maximize functional independence and reduce burden of care prior to discharge. Anticipate that pt will need 24/7 supervision at discharge in addition to [place] follow up at next level of care.     Intensity: Minumum of 1-2 x/day, 30 to 90 minutes Frequency: 3 to 5 out of 7 days Duration/Length of Stay: 11/09/20 Treatment/Interventions: Cognitive  remediation/compensation;Cueing hierarchy;Dysphagia/aspiration precaution training;Functional tasks;Patient/family education;Therapeutic Activities;Internal/external aids;Speech/Language facilitation    [name] 10/24/2020, 7:32 AM

## 2020-10-24 NOTE — Progress Notes (Signed)
Modified Barium Swallow Progress Note  Patient Details  Name: Fernando [name] MRN: [id number] Date of Birth: Jul 09, 1927  Today's Date: 10/24/2020  Modified Barium Swallow completed.  Full report located under Chart Review in the Imaging Section.  Brief recommendations include the following:  Clinical Impression  Pt presents with mild-mod oropharyngeal dysphagia that is largely unchanged in function since last MBSS conducted 10/09/20. MBSS was repeated today due to concerns for increase in coughing at bedside. Current pharyngeal deficits are suspected due to baseline structural abnormalities of cervical spine and hx of oral cancer with radiation treatments, exacerbated by weakness in setting of acute CVA. Pt's dysphagia is characterized by premature spillage and reduced epiglottic deflection, causing various liquid textures to penetrate into the laryngeal vestibule before the swallow. Although no aspiration was observed throughout study, consistent throat clearing and coughing noted after each presentation of barium, which is also very consistent with his bedside presentation. Nectar barium appeared to result in a larger volume of penetration and was more difficult for pt to clear from vestibule with cough response in comparison to thin barium. Pt requires cueing to use slower rate, one sip at a time, and straw facilitated most efficient intake due to decreased labial seal and oral/facial weakness. Pt unable to masticate a trial of Dys 3 (mechanical soft) solids, ultimately leading to oral expectoration of PO. Dys 1 (puree) textures were consumed most efficiently and with no airway intrusion, but some pharyngeal residue noted. In addition, reduced upper esophageal sphincter (UES) opening was observed secondary to appearance of cervical lordosis. Pt consumed a barium pill whole in applesauce with efficient oral transit, but on brief scan of esophagus, it was observed to remain in upper half of esophagus  until pt took additional sips of thin liquid. Recommend pt continue with current Dys 1 (puree) texture diet and thin liquids. Pt should follow solids with liquids (including pills) and produce frequent volitional coughing after POs to assist with clearance of potential penetrates. Pt should also use slow rate and take rest breaks as needed to coordinate breathing and swallowing sequence, given his decreased respiratory status and CHF dx. SLP will continue to provide skilled interventions to ensure diet safety, efficiency, and to work toward advancement and increased independence with use of compensatory swallow strategies.    Swallow Evaluation Recommendations      SLP Diet Recommendations: Dysphagia 1 (Puree) solids;Thin liquid   Liquid Administration via: Straw   Medication Administration: Crushed with puree   Supervision: Full supervision/cueing for compensatory strategies;Staff to assist with self feeding   Compensations: Follow solids with liquid;Slow rate;Small sips/bites;Minimize environmental distractions;Hard cough after swallow   Postural Changes: Remain semi-upright after after feeds/meals (Comment);Seated upright at 90 degrees   Oral Care Recommendations: Oral care BID        [name] 10/24/2020,10:24 AM

## 2020-10-25 ENCOUNTER — Inpatient Hospital Stay (HOSPITAL_COMMUNITY): Payer: PPO | Admitting: Speech Pathology

## 2020-10-25 ENCOUNTER — Inpatient Hospital Stay (HOSPITAL_COMMUNITY): Payer: PPO | Admitting: Occupational Therapy

## 2020-10-25 ENCOUNTER — Inpatient Hospital Stay (HOSPITAL_COMMUNITY): Payer: PPO

## 2020-10-25 LAB — GLUCOSE, CAPILLARY
Glucose-Capillary: 94 mg/dL (ref 70–99)
Glucose-Capillary: 106 mg/dL — ABNORMAL HIGH (ref 70–99)
Glucose-Capillary: 109 mg/dL — ABNORMAL HIGH (ref 70–99)
Glucose-Capillary: 138 mg/dL — ABNORMAL HIGH (ref 70–99)

## 2020-10-25 LAB — CBC
HCT: 30.8 % — ABNORMAL LOW (ref 39.0–52.0)
Hemoglobin: 9.2 g/dL — ABNORMAL LOW (ref 13.0–17.0)
MCH: 24.1 pg — ABNORMAL LOW (ref 26.0–34.0)
MCHC: 29.9 g/dL — ABNORMAL LOW (ref 30.0–36.0)
MCV: 80.8 fL (ref 80.0–100.0)
Platelets: 262 10*3/uL (ref 150–400)
RBC: 3.81 MIL/uL — ABNORMAL LOW (ref 4.22–5.81)
RDW: 24.1 % — ABNORMAL HIGH (ref 11.5–15.5)
WBC: 4.8 10*3/uL (ref 4.0–10.5)
nRBC: 0 % (ref 0.0–0.2)

## 2020-10-25 MED ORDER — ENSURE ENLIVE PO LIQD
237.0000 mL | Freq: Two times a day (BID) | ORAL | Status: DC
  Administered 2020-10-25 – 2020-11-10 (×28): 237 mL via ORAL
  Filled 2020-10-25: qty 237

## 2020-10-25 MED ORDER — LIDOCAINE 5 % EX PTCH
1.0000 | MEDICATED_PATCH | Freq: Every day | CUTANEOUS | Status: DC
  Administered 2020-10-25 – 2020-11-11 (×18): 1 via TRANSDERMAL
  Filled 2020-10-25 (×18): qty 1

## 2020-10-25 MED ORDER — PANTOPRAZOLE SODIUM 40 MG PO TBEC
40.0000 mg | DELAYED_RELEASE_TABLET | Freq: Two times a day (BID) | ORAL | Status: DC
  Administered 2020-10-25 – 2020-11-11 (×34): 40 mg via ORAL
  Filled 2020-10-25 (×18): qty 1
  Filled 2020-10-25 (×3): qty 2
  Filled 2020-10-25 (×8): qty 1
  Filled 2020-10-25 (×2): qty 2
  Filled 2020-10-25 (×2): qty 1
  Filled 2020-10-25: qty 2
  Filled 2020-10-25: qty 1

## 2020-10-25 MED ORDER — LOSARTAN POTASSIUM 50 MG PO TABS
50.0000 mg | ORAL_TABLET | Freq: Every day | ORAL | Status: DC
  Administered 2020-10-26 – 2020-11-11 (×16): 50 mg via ORAL
  Filled 2020-10-25 (×17): qty 1

## 2020-10-25 NOTE — Patient Care Conference (Signed)
Inpatient RehabilitationTeam Conference and Plan of Care Update Date: 10/25/2020   Time: 10:37 AM    Patient Name: Fernando [name]      Medical Record Number: [id number]  Date of Birth: [birth year] Sex: Male         Room/Bed: 4W17C/4W17C-01 Payor Info: Payor: [employer] / Plan: [employer] PPO / Product Type: *No Product type* /    Admit Date/Time:  10/14/2020  4:15 PM  Primary Diagnosis:  <principal problem not specified>  Hospital Problems: Active Problems:   Right middle cerebral artery stroke (HCC)   Leukocytosis   Prediabetes   Dysphagia, post-stroke   Stage 3a chronic kidney disease [facility])    Expected Discharge Date: Expected Discharge Date: 11/09/20  Team Members Present: Physician leading conference: Dr. Alysia Penna Care Coodinator Present: Dorien Chihuahua, RN, BSN, CRRN;Christina Sampson Goon, [place] Nurse Present: Other (comment) Susie Cassette, RN) PT Present: Estevan Ryder, PT OT Present: Clyda Greener, OT SLP Present: Jettie Booze, CF-SLP PPS Coordinator present : Ileana Ladd, Burna Mortimer, SLP     Current Status/Progress Goal Weekly Team Focus  Bowel/Bladder   Incontinent x2 LBM 11/  Become continent x2; regular bowel movements  Assess toileting needs QS/PRN, initiate time toileting   Swallow/Nutrition/ Hydration   Dys 1, thin liquids, frequent coughing, repeat MBSS 11/30 with no change  Supervision  carryover swallow strategies (follow liquids with solids, slow rate, 1 sip at time, rest breaks, intermittent volitional coughing)   ADL's   Min A for UB bathing, self-feeding, grooming, oral care. Max A for LB bathing. Total A for LB dressing. Max A for stand-pivot transfer from EOB to w/c with RW. Improved BUE func strength 2/2 decreased edema.  min to mod assist  selfcare retraining, DME education, NMR, balance retraining, family/pt education, functional strength/cognition   Mobility   ModA bed mobility, ModA STS with RW, Stedy for  transfers d/t skin integrity, gait up to 32ft with ModA + wc follow RW  min assist overall at short distance household ambulatory level  activity tolerance, transfer training, B LE strengthing, L NMR, sitting/standing balance, pt/family education   Communication   HOH, voice amplifier in room (hearing aids family brought in not particularly effective), Mod A use intelligibility strategies  Min  carryover speech intelligibility strategies   Safety/Cognition/ Behavioral Observations  Mod-Max A  Min A  basic familiar problem solving, recall with aids/strategies, attention, awareness   Pain   aptient c/o lower back pain, medicate with Tylenol 650mg  po  Pain <3/10  Contiune to assess and initaite suggesting patient take medication before/during and after therapy and prn   Skin   Skin intact,,MASD to sacral area improving,and brusing to arm, leg,hip and abdomen,  Prevent further breakdown, provide skincare  Skin assessment QS/PRN, assist with repositioning, utilize pillows, and elevate extremities     Discharge Planning:  D/C home with son (also dtr, niece and nephew) 1 level home, 5 steps to enter   Team Discussion: Cardiology adjusting medications as note elevated BP with prn medications. Xarelto restarted per MD; monitoring labs post GI bleed. Remains incontinent of bowel and bladder. Progress impaired by chronic back pain; MD ordered k-pad and continue prn medications for pain.  Patient on target to meet rehab goals: yes, slow progress. Currently max assist for transfers, min assist for upper body care and max assist for lower body care. Continue D1 nectar thick diet; note coughing however no evidence of aspiration per SLP.  *See Care Plan and progress notes for long and short-term  goals.   Revisions to Treatment Plan:  Nutritional supplement; patient drinking Ensure PTA Teaching Needs:   Current Barriers to Discharge: Decreased caregiver support, Home enviroment access/layout and  Incontinence  Possible Resolutions to Barriers: Ramp for entry to home     Medical Summary Current Status: heart failure improved  with med change per cardiology still with elevated BP, Xarelto for Afib restarted, Hgb stable thus far  Barriers to Discharge: Medical stability;Other (comments)  Barriers to Discharge Comments: ext edema improved after diuresis Possible Resolutions to Barriers/Weekly Focus: monitor CBC, adjust anti hypertensive meds   Continued Need for Acute Rehabilitation Level of Care: The patient requires daily medical management by a physician with specialized training in physical medicine and rehabilitation for the following reasons: Direction of a multidisciplinary physical rehabilitation program to maximize functional independence : Yes Medical management of patient stability for increased activity during participation in an intensive rehabilitation regime.: Yes Analysis of laboratory values and/or radiology reports with any subsequent need for medication adjustment and/or medical intervention. : Yes   I attest that I was present, lead the team conference, and concur with the assessment and plan of the team.   Dorien Chihuahua B 10/25/2020, 2:52 PM

## 2020-10-25 NOTE — Progress Notes (Signed)
Patient ID: Fernando [name], male   DOB: Apr 18, 1927, 86 y.o.   MRN: [id number] Team Conference Report to Patient/Family  Team Conference discussion was reviewed with the patient and caregiver, including goals, any changes in plan of care and target discharge date.  Patient and caregiver express understanding and are in agreement.  The patient has a target discharge date of 11/09/20.  Fernando [name] 10/25/2020, 1:25 PM

## 2020-10-25 NOTE — Progress Notes (Signed)
Speech Language Pathology Daily Session Note  Patient Details  Name: Fernando [name] MRN: [id number] Date of Birth: 21-Feb-1927  Today's Date: 10/25/2020 SLP Individual Time: 5366-4403 SLP Individual Time Calculation (min): 26 min  Short Term Goals: Week 2: SLP Short Term Goal 1 (Week 2): Pt will use compensatory swallowing strategies with current diet with Min A verbal cues. SLP Short Term Goal 2 (Week 2): Pt will demonstrate efficient mastication, oral clearance, and no decline in respiratory status when cosuming trials of Dys 2 textures across at least 2 session prior to upgrade. SLP Short Term Goal 3 (Week 2): Pt will use compensatory strategies to increase speech intelligibility to 75% at the phrase level with Mod A multimodal cues. SLP Short Term Goal 4 (Week 2): Pt will recall 1 safety precaution with Mod A cues for use of strategies and/or aids. SLP Short Term Goal 5 (Week 2): Pt will identify 1 physical and 1 cognitive-linguistic deficit in setting of acute CVA with Mod A cues. SLP Short Term Goal 6 (Week 2): Pt will demonstrate ability to problem solve basic functional and familiar situations with Min A cues.  Skilled Therapeutic Interventions: Pt was seen for skilled ST targeting speech and swallow goals. Pt agreeable to try bites of applesauce and sips of water from his breakfast tray upon therapist's arrival. Intermittent subtle throat clearing noted after sips of thin, and pt required Mod faded to Min A for use of recommended compensatory swallow strategies during intake. He required frequent rest breaks to better coordinate breathing and swallowing, as his work of breathing appears to increase as consumption went on. Recommend continue current diet. Pt's speech intelligibility is significantly improved since admission. Pt read a paragraph (Grandfather passage) with 80-85% intelligibility and Min A verbal cues for increased vocal intensity. When conversing with therapist throughout  session, he was also 85-90% intelligible with no more than Min A cues required for repeating messages with increased vocal intensity. Pt left sitting in wheelchair with alarm set and needs within reach. Continue per current plan of care.      Pain Pain Assessment Pain Scale: 0-10 Pain Score: 0-No pain  Therapy/Group: Individual Therapy  [name] 10/25/2020, 10:01 AM

## 2020-10-25 NOTE — Progress Notes (Addendum)
Occupational Therapy Session Note  Patient Details  Name: Fernando [name] MRN: [id number] Date of Birth: 1926-12-25  Today's Date: 10/25/2020 OT Individual Time: 6440-3474 OT Individual Time Calculation (min): 41 min    Short Term Goals: Week 2:  OT Short Term Goal 1 (Week 2): Pt will don shirt with mod A OT Short Term Goal 2 (Week 2): Pt will complete self feeding with LRAD with min A OT Short Term Goal 3 (Week 2): Pt will complete sit to stand for progressing of LB selfcare with max assist. OT Short Term Goal 4 (Week 2): Pt will perform squat pivot transfer to the drop arm commode with max assist.  Skilled Therapeutic Interventions/Progress Updates:    Pt received semi-reclined in bed with RN and NT present. Session focus on UB bathing + full-body dressing. Pt wearing voice amplifier throughout session. Pt completed roll to R with mod A for hand placement and initial roll. Sup <> sit EOB with mod A to lift trunk and move BLE off bed. Maintains static sitting balance with close supervision. Cont to dem min to mod labored breathing + SOB throughout session that improves with rest breaks. Doffed long-sleeved shirt with max to remove BUE. Bathed UB EOB with overall min A to lift LUE. Opened deodorant with RUE with min A to use LUE as stabilizer. Donned long-sleeved shirt with max A to thread BUE and head. Seated EOB, donned pants with total A to thread BLE and to pull up over hips in standing. After 2x attempts, total A + 2 (pt 25%) for sit<>stand with RW to pull up pants. Req rest break of ~2 min after dressing by leaning on to R elbow due to increased fatigue. Bed to w/c transfer with stedy with mod A to power up. Self-drank from cup with min A to lift cup to lips with BUE. Continues to demonstrate improved BUE strength/functional grasp + decrease in BUE edema. Pt c/o of back soreness throughout session, RN notified. Pt left in w/c with chair alarm engaged, BUE supported by pillows, adapted call bell  in reach.    Therapy Documentation Precautions:  Precautions Precautions: ICD/Pacemaker, Fall Precaution Comments: L sided weakness; very HOH; dysarthria Restrictions Weight Bearing Restrictions: No  Pain: C/o of back soreness. Did not quantity. RN notified pt requesting pain medication.   ADL: See Care Tool for more details.  Therapy/Group: Individual Therapy  Volanda Napoleon MS, OTR/L Clyda Greener OTR/L   10/25/2020, 7:42 AM

## 2020-10-25 NOTE — Progress Notes (Signed)
[facility] PHYSICAL MEDICINE & REHABILITATION PROGRESS NOTE   Subjective/Complaints:   ROS: Limited due to [place] (has pocket talker on )  Objective:   DG Swallowing Func-Speech Pathology  Result Date: 10/24/2020 Objective Swallowing Evaluation: Type of Study: MBS-Modified Barium Swallow Study  Patient Details Name: Fernando [name] MRN: [id number] Date of Birth: [birth year] Today's Date: 10/24/2020 Past Medical History: Past Medical History: Diagnosis Date . Arthritis  . Atrial fibrillation ([place])  . Carotid artery disease ([place])   Dr. Kellie Simmering - s/p left CEA . Coronary artery disease  . Essential hypertension, benign  . Hyperlipidemia  . 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 This mass was initially felt to be 1.7 cm at the time of presentation but had enlarged significantly to 4 cm clinically at the time of surgery. Pathologically it was 3.4 cm. A lymph node deep to the parotid was enlarged clinically at the time of surgery but was negative  . Tachycardia-bradycardia syndrome [facility])  Past Surgical History: Past Surgical History: Procedure Laterality Date . CAROTID ENDARTERECTOMY Left Jan. 3, 2009  CE . CATARACT EXTRACTION W/ INTRAOCULAR LENS IMPLANT    Bilateral . ESOPHAGOGASTRODUODENOSCOPY N/A 03/29/2018  Procedure: ESOPHAGOGASTRODUODENOSCOPY (EGD);  Surgeon: Danie Binder, MD;  [facility];  Service: Endoscopy;  Laterality: N/A; . EYE SURGERY   . INSERT / REPLACE / REMOVE PACEMAKER   . IR INTRAVSC STENT CERV CAROTID W/EMB-PROT MOD SED INCL ANGIO  10/06/2020 . IR US GUIDE VASC ACCESS LEFT  10/06/2020 . IR US GUIDE VASC ACCESS RIGHT  10/06/2020 . IR US GUIDE VASC ACCESS RIGHT  10/06/2020 . PACEMAKER INSERTION  10-01-2012 . PARATHYROIDECTOMY  10/28/2012 . PAROTIDECTOMY  10/28/2012  Procedure: PAROTIDECTOMY;  Surgeon: Ascencion Dike, MD;  Location: [place];   Service: ENT;  Laterality: Right;  Total Parotidectomy . PERMANENT PACEMAKER INSERTION N/A 10/01/2012  Procedure: PERMANENT PACEMAKER INSERTION;  Surgeon: Evans Lance, MD;  Location: [facility] CATH LAB;  Service: Cardiovascular;  Laterality: N/A; . RADIOLOGY WITH ANESTHESIA N/A 10/07/2020  Procedure: MRI WITH ANESTHESIA;  Surgeon: Radiologist, Medication, MD;  Location: [place];  Service: Radiology;  Laterality: N/A; . RADIOLOGY WITH ANESTHESIA N/A 10/06/2020  Procedure: IR WITH ANESTHESIA;  Surgeon: Luanne Bras, MD;  Location: [place];  Service: Radiology;  Laterality: N/A; . TEE WITHOUT CARDIOVERSION N/A 10/10/2020  Procedure: TRANSESOPHAGEAL ECHOCARDIOGRAM (TEE);  Surgeon: Lelon Perla, MD;  Location: [facility] ENDOSCOPY;  Service: Cardiovascular;  Laterality: N/A; HPI: The pt is a 86 yo male presenting with L sided weakness and AMS. Upon work-up, found to have large R MCA stenosis and is now s/p stenting. Pt intubated for procedure 11/12 and extubated 11/13. PMH includes: a fib on Xarelto, CAD, HTN, HLD, parotidectomy 10/2012, pacemaker 10/2012. Pt admitted to [facility] 10/14/20.  Subjective: Pt was pleasant and cooperative Assessment / Plan / Recommendation CHL IP CLINICAL IMPRESSIONS 10/24/2020 Clinical Impression Pt presents with mild-mod oropharyngeal dysphagia that is largely unchanged in function since last MBSS conducted 10/09/20. MBSS was repeated today due to concerns for increase in coughing at bedside. Current pharyngeal deficits are suspected due to baseline structural abnormalities of cervical spine and hx of oral cancer with radiation treatments, exacerbated by weakness in setting of acute CVA. Pt's dysphagia is characterized by premature spillage and reduced epiglottic deflection, causing various liquid textures to penetrate into the laryngeal vestibule before the swallow. Although no aspiration was observed throughout study, consistent  throat clearing and coughing noted after each presentation of barium,  which is also very consistent with his bedside presentation. Nectar barium appeared to result in a larger volume of penetration and was more difficult for pt to clear from vestibule with cough response in comparison to thin barium. Pt requires cueing to use slower rate, one sip at a time, and straw facilitated most efficient intake due to decreased labial seal and oral/facial weakness. Pt unable to masticate a trial of Dys 3 (mechanical soft) solids, ultimately leading to oral expectoration of PO. Dys 1 (puree) textures were consumed most efficiently and with no airway intrusion, but some pharyngeal residue noted. In addition, reduced upper esophageal sphincter (UES) opening was observed secondary to appearance of cervical lordosis. Pt consumed a barium pill whole in applesauce with efficient oral transit, but on brief scan of esophagus, it was observed to remain in upper half of esophagus until pt took additional sips of thin liquid. Recommend pt continue with current Dys 1 (puree) texture diet and thin liquids. Pt should follow solids with liquids (including pills) and produce frequent volitional coughing after POs to assist with clearance of potential penetrates. Pt should also use slow rate and take rest breaks as needed to coordinate breathing and swallowing sequence, given his decreased respiratory status and CHF dx. SLP will continue to provide skilled interventions to ensure diet safety, efficiency, and to work toward advancement and increased independence with use of compensatory swallow strategies. SLP Visit Diagnosis Dysphagia, oropharyngeal phase (R13.12) Attention and concentration deficit following -- Frontal lobe and executive function deficit following -- Impact on safety and function Moderate aspiration risk   CHL IP TREATMENT RECOMMENDATION 10/09/2020 Treatment Recommendations Therapy as outlined in treatment plan below   Prognosis 10/09/2020 Prognosis for Safe Diet Advancement Fair Barriers to  Reach Goals -- Barriers/Prognosis Comment -- CHL IP DIET RECOMMENDATION 10/24/2020 SLP Diet Recommendations Dysphagia 1 (Puree) solids;Thin liquid Liquid Administration via Straw Medication Administration Crushed with puree Compensations Follow solids with liquid;Slow rate;Small sips/bites;Minimize environmental distractions;Hard cough after swallow Postural Changes Remain semi-upright after after feeds/meals (Comment);Seated upright at 90 degrees   CHL IP OTHER RECOMMENDATIONS 10/24/2020 Recommended Consults -- Oral Care Recommendations Oral care BID Other Recommendations --   CHL IP FOLLOW UP RECOMMENDATIONS 10/12/2020 Follow up Recommendations Inpatient Rehab   CHL IP FREQUENCY AND DURATION 10/09/2020 Speech Therapy Frequency (ACUTE ONLY) min 2x/week Treatment Duration 2 weeks      CHL IP ORAL PHASE 10/24/2020 Oral Phase Impaired Oral - Pudding Teaspoon -- Oral - Pudding Cup -- Oral - Honey Teaspoon NT Oral - Honey Cup -- Oral - Nectar Teaspoon -- Oral - Nectar Cup Lingual/palatal residue;Premature spillage;Weak lingual manipulation Oral - Nectar Straw NT Oral - Thin Teaspoon -- Oral - Thin Cup Right anterior bolus loss;Left anterior bolus loss;Weak lingual manipulation;Lingual/palatal residue;Piecemeal swallowing;Premature spillage Oral - Thin Straw Premature spillage;Weak lingual manipulation Oral - Puree Weak lingual manipulation Oral - Mech Soft Impaired mastication;Lingual/palatal residue;Reduced posterior propulsion;Weak lingual manipulation Oral - Regular -- Oral - Multi-Consistency -- Oral - Pill Weak lingual manipulation Oral Phase - Comment --  CHL IP PHARYNGEAL PHASE 10/24/2020 Pharyngeal Phase Impaired Pharyngeal- Pudding Teaspoon -- Pharyngeal -- Pharyngeal- Pudding Cup -- Pharyngeal -- Pharyngeal- Honey Teaspoon NT Pharyngeal -- Pharyngeal- Honey Cup -- Pharyngeal -- Pharyngeal- Nectar Teaspoon -- Pharyngeal -- Pharyngeal- Nectar Cup Reduced airway/laryngeal closure;Reduced anterior laryngeal  mobility;Delayed swallow initiation-pyriform sinuses;Delayed swallow initiation-vallecula;Penetration/Aspiration before swallow;Reduced epiglottic inversion;Pharyngeal residue - valleculae Pharyngeal Material enters airway, CONTACTS cords and not ejected out;Material  enters airway, CONTACTS cords and then ejected out Pharyngeal- Nectar Straw NT Pharyngeal -- Pharyngeal- Thin Teaspoon -- Pharyngeal -- Pharyngeal- Thin Cup Reduced epiglottic inversion;Delayed swallow initiation-pyriform sinuses;Reduced anterior laryngeal mobility;Reduced airway/laryngeal closure;Penetration/Aspiration before swallow Pharyngeal Material enters airway, CONTACTS cords and then ejected out;Material enters airway, remains ABOVE vocal cords then ejected out Pharyngeal- Thin Straw Penetration/Aspiration before swallow;Reduced airway/laryngeal closure;Reduced anterior laryngeal mobility;Reduced epiglottic inversion Pharyngeal Material enters airway, remains ABOVE vocal cords then ejected out Pharyngeal- Puree Pharyngeal residue - valleculae;Pharyngeal residue - cp segment Pharyngeal -- Pharyngeal- Mechanical Soft Other (Comment) Pharyngeal -- Pharyngeal- Regular -- Pharyngeal -- Pharyngeal- Multi-consistency -- Pharyngeal -- Pharyngeal- Pill WFL Pharyngeal -- Pharyngeal Comment --  CHL IP CERVICAL ESOPHAGEAL PHASE 10/24/2020 Cervical Esophageal Phase -- Pudding Teaspoon -- Pudding Cup -- Honey Teaspoon -- Honey Cup -- Nectar Teaspoon -- Nectar Cup -- Nectar Straw -- Thin Teaspoon -- Thin Cup -- Thin Straw -- Puree -- Mechanical Soft -- Regular -- Multi-consistency -- Pill -- Cervical Esophageal Comment Reduced UES opening [name] 10/24/2020, 10:26 AM              Recent Labs    10/25/20 0641  WBC 4.8  HGB 9.2*  HCT 30.8*  PLT 262   No results for input(s): NA, K, CL, CO2, GLUCOSE, BUN, CREATININE, CALCIUM in the last 72 hours.  Intake/Output Summary (Last 24 hours) at 10/25/2020 0733 Last data filed at 10/24/2020 2240 Gross  per 24 hour  Intake 660 ml  Output --  Net 660 ml     Pressure Injury 10/14/20 Buttocks Left Deep Tissue Pressure Injury - Purple or maroon localized area of discolored intact skin or blood-filled blister due to damage of underlying soft tissue from pressure and/or shear. Purple/red area small fluid filled  (Active)  10/14/20 1700  Location: Buttocks  Location Orientation: Left  Staging: Deep Tissue Pressure Injury - Purple or maroon localized area of discolored intact skin or blood-filled blister due to damage of underlying soft tissue from pressure and/or shear.  Wound Description (Comments): Purple/red area small fluid filled blister noted at center  Present on Admission: Yes    Physical Exam: Vital Signs Blood pressure (!) 143/50, pulse 63, temperature 97.8 F (36.6 C), resp. rate 14, height _0  (1.651 m), weight 63.8 kg, SpO2 99 %.   General: No acute distress Mood and affect are appropriate Heart: Regular rate and rhythm no rubs murmurs or extra sounds Lungs: Clear to auscultation, breathing unlabored, no rales or wheezes Abdomen: Positive bowel sounds, soft nontender to palpation, nondistended Extremities: No clubbing, cyanosis, or edema Skin: see above    Neuro: Alert Extremely HOH Motor: Limited due to ability to follow commands-RUE: 5/5 proximal distal RLE: 4 -/5 proximal to distal LUE: 4 /5 proximal distal, appears unchanged LLE:  3/5 proximal distal   Assessment/Plan: 1. Functional deficits which require 3+ hours per day of interdisciplinary therapy in a comprehensive inpatient rehab setting.  Physiatrist is providing close team supervision and 24 hour management of active medical problems listed below.  Physiatrist and rehab team continue to assess barriers to discharge/monitor patient progress toward functional and medical goals  Care Tool:  Bathing  Bathing activity did not occur: Refused Body parts bathed by patient: Right arm, Left arm, Chest,  Abdomen, Face   Body parts bathed by helper: Buttocks, Right lower leg, Left lower leg, Left upper leg, Right upper leg Body parts n/a: Front perineal area, Buttocks, Right upper leg, Left upper leg, Right lower leg, Left  lower leg, Face (didn't attept LB bathing this session)   Bathing assist Assist Level: Moderate Assistance - Patient 50 - 74%     Upper Body Dressing/Undressing Upper body dressing   What is the patient wearing?: Pull over shirt    Upper body assist Assist Level: Moderate Assistance - Patient 50 - 74%    Lower Body Dressing/Undressing Lower body dressing      What is the patient wearing?: Pants     Lower body assist Assist for lower body dressing: Total Assistance - Patient < 25%     Toileting Toileting    Toileting assist Assist for toileting: Total Assistance - Patient < 25%     Transfers Chair/bed transfer  Transfers assist     Chair/bed transfer assist level: Maximal Assistance - Patient 25 - 49%     Locomotion Ambulation   Ambulation assist   Ambulation activity did not occur: Safety/medical concerns  Assist level: 2 helpers Assistive device: Walker-rolling Max distance: 15   Walk 10 feet activity   Assist  Walk 10 feet activity did not occur: Safety/medical concerns  Assist level: 2 helpers Assistive device: Walker-rolling   Walk 50 feet activity   Assist           Walk 150 feet activity   Assist Walk 150 feet activity did not occur: Safety/medical concerns         Walk 10 feet on uneven surface  activity   Assist Walk 10 feet on uneven surfaces activity did not occur: Safety/medical concerns         Wheelchair     Assist Will patient use wheelchair at discharge?: Yes Type of Wheelchair: Manual    Wheelchair assist level: Minimal Assistance - Patient > 75% Max wheelchair distance: 25    Wheelchair 50 feet with 2 turns activity    Assist        Assist Level: Moderate Assistance -  Patient 50 - 74%   Wheelchair 150 feet activity     Assist      Assist Level: Moderate Assistance - Patient 50 - 74%    Medical Problem List and Plan: 1.Left side hemiparesis dysarthria/dysphagiasecondary to right MCA infarction due to right ICA high-grade stenosis status post angioplasty  Continue CIR PT, OT, SLP Team conference today please see physician documentation under team conference tab, met with team  to discuss problems,progress, and goals. Formulized individual treatment plan based on medical history, underlying problem and comorbidities.  2. Antithrombotics: -DVT/anticoagulation:Per neurology stopping Xarelto  -antiplatelet therapy: Aspirin81 mg daily 3. Pain Management:Tylenol as needed.  4. Mood:Provide emotional support -antipsychotic agents: N/A 5. Neuropsych: This patientis notcapable of making decisions on hisown behalf. 6. Skin/Wound Care:Routine skin checks 7. Fluids/Electrolytes/Nutrition:Routine in and outs  8. Atrial fibrillation/tachybradycardia syndrome/pacemaker. Per Neuro and cardiology will resume Xarelto and monitor Hgb  Continue Toprol25 mg daily  Rate controlled on 11/28 9. SCC of the carotid gland. Follow-up outpatient 10. Hypertension. Toprol 25 mg daily.   Cozaar 25 started on 11/27   Vitals:   10/24/20 2036 10/25/20 0441  BP: (!) 151/57 (!) 143/50  Pulse: 63 63  Resp: 18 14  Temp: 98.1 F (36.7 C) 97.8 F (36.6 C)  SpO2: 94% 99%  monitor trend 12/1  11. Hypothyroidism. Synthroid 12. Bacteremia.Completed Ancef 2 g every 8 hours 10-daycourse 13.  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 Dysphagia # 1 thins liquids. May resume diet, drank H20 for me through a straw without cough or wet voice 15. CKD  stage II/III. Baseline 1.30-1.48.   Creatinine 0.98 on 11/27 16. Acute blood loss anemia with history of GI bleed 2019. Patient transfused 1 unit packed red blood cells  10/12/2020. Continue iron supplement. Protonix 40 mg twice daily.   Hemoglobin 8.9 on 11/28 stable vs 11/22 ok to resume Xarelto as rec per Neuro and cardio  17. Prediabetes. Hemoglobin A1c 5.7. SSI.   Slightly labile, but relatively controlled on 11/28   18. Chronic  systolic congestive heart failure-compensated, cards has signed off   Lasix 20 daily per cardiology, appreciate recs Filed Weights   10/22/20 0500 10/23/20 0500 10/25/20 0553  Weight: 65.1 kg 64 kg 63.8 kg    Stable on 12/1  LOS: 11 days A FACE TO [place] E Anureet [place]ington 10/25/2020, 7:33 AM

## 2020-10-25 NOTE — Progress Notes (Signed)
Physical Therapy Session Note  Patient Details  Name: Fernando [name] MRN: [id number] Date of Birth: 02-Jun-1927  Today's Date: 10/25/2020 PT Individual Time: 1130-1200 PT Individual Time Calculation (min): 30 min   Short Term Goals: Week 2:  PT Short Term Goal 1 (Week 2): Pt will be able to perform bed <> chair transfer with mod assist PT Short Term Goal 2 (Week 2): Patient will complete sit <> stand with MinA and LRAD PT Short Term Goal 3 (Week 2): Patient will ambulate >53ft with MinA, wc follow if needed, LRAD  Skilled Therapeutic Interventions/Progress Updates:     Pt received seated in [facility] and agrees to therapy. Complains of low back pain. Number not provided. PT provides rest breaks as needed and mobilization to address pain symptoms. WC transport to gym for time management and energy conservation. Pt ambulates multiple bouts during session. Sit to stand throughout session requires modA +1 initially but progresses to light minA with verbal cues on initiation. Pt ambulates 15', 15', 20', 29', and 29', with modA +1 and close WC follow. Extended seated rest breaks between each bout and pt with audible dyspnea that resolves quickly. PT provides multimodal cuing for upright posture, reciprocal gait pattern, lateral weight shifting, safe management of RW, and hand placement/sequencing for safe stand>sit. Pt has very forward flexed posture but is able to marginally correct with consistent verbal cues and tactile cues. Pt left seated in WC with alarm intact, daughter present, and all needs within reach.   Therapy Documentation Precautions:  Precautions Precautions: ICD/Pacemaker, Fall Precaution Comments: L sided weakness; very HOH; dysarthria Restrictions Weight Bearing Restrictions: No    Therapy/Group: Individual Therapy  Breck Coons, PT, DPT 10/25/2020, 12:06 PM

## 2020-10-25 NOTE — Progress Notes (Signed)
Physical Therapy Session Note  Patient Details  Name: Fernando [name] MRN: [id number] Date of Birth: [birth year]  Today's Date: 10/25/2020 PT Individual Time: 1432-1530 PT Individual Time Calculation (min): 58 min   Short Term Goals: Week 2:  PT Short Term Goal 1 (Week 2): Pt will be able to perform bed <> chair transfer with mod assist PT Short Term Goal 2 (Week 2): Patient will complete sit <> stand with MinA and LRAD PT Short Term Goal 3 (Week 2): Patient will ambulate >61ft with MinA, wc follow if needed, LRAD  Skilled Therapeutic Interventions/Progress Updates:    Patient received supine in bed, agreeable to PT. He reports "mild" low back pain, premedicated. PT providing distraction and repositioning to assist with pain management. He required MaxA to complete supine > sit using log roll technique. MaxA x1 + stand by assist of 2nd person for squat pivot transfer to wc. Patient unable to reposition once in wc requiring TotalA from PT. PT propelling patient in wc to therapy gym for energy conservation. PT providing gentle soft tissue massage to R lumbar paraspinals- noted increased tone. Patient standing with RW and ModA. He was able to ambulate ~96ft, but demonstrated significantly flexed posture, WBOS, decreased to no B foot clearance. Gait mechanics can be attributed to patients reports of increased fatigue. Patient returning to wc, agreeable to working on transfers instead of gait. MaxA x2 squat pivot transfers x4 to/from therapy mat. Manual placement of UE and verbal and tactile cues for increased anterior weight shift, patient unable to maintain anterior weight shift throughout transfer. Patient able to improve participation when cued to lean into B LE to lift bottom up. Patient requires extended seated rest breaks between trials due to fatigue and SOB. Vitals WNL throughout therapy session. Patient returning to bed via squat pivot with MaxA x2. MaxA sit > supine. Bed alarm on, call light within  reach.   Therapy Documentation Precautions:  Precautions Precautions: ICD/Pacemaker, Fall Precaution Comments: L sided weakness; very HOH; dysarthria Restrictions Weight Bearing Restrictions: No    Therapy/Group: Individual Therapy  [name], PT, DPT, CBIS  10/25/2020, 9:02 AM

## 2020-10-26 ENCOUNTER — Inpatient Hospital Stay (HOSPITAL_COMMUNITY): Payer: PPO | Admitting: Physical Therapy

## 2020-10-26 ENCOUNTER — Inpatient Hospital Stay (HOSPITAL_COMMUNITY): Payer: PPO | Admitting: Speech Pathology

## 2020-10-26 ENCOUNTER — Inpatient Hospital Stay (HOSPITAL_COMMUNITY): Payer: PPO | Admitting: Occupational Therapy

## 2020-10-26 LAB — GLUCOSE, CAPILLARY
Glucose-Capillary: 108 mg/dL — ABNORMAL HIGH (ref 70–99)
Glucose-Capillary: 116 mg/dL — ABNORMAL HIGH (ref 70–99)
Glucose-Capillary: 122 mg/dL — ABNORMAL HIGH (ref 70–99)
Glucose-Capillary: 126 mg/dL — ABNORMAL HIGH (ref 70–99)

## 2020-10-26 NOTE — Progress Notes (Signed)
Speech Language Pathology Daily Session Note  Patient Details  Name: Fernando [name] MRN: [id number] Date of Birth: 1927-11-15  Today's Date: 10/26/2020 SLP Individual Time: 1500-1530 SLP Individual Time Calculation (min): 30 min  Short Term Goals: Week 1: SLP Short Term Goal 1 (Week 1): Pt will consume current diet with appreciable change in respiratory status or other vitals to indicate tolerance, and Mod A cues for use of compensatory swallow stategies. SLP Short Term Goal 1 - Progress (Week 1): Met SLP Short Term Goal 2 (Week 1): Pt will consume trials of Dys 2 textures demonstrating efficinent mastication and oral clearance X3 prior to upgrade. SLP Short Term Goal 2 - Progress (Week 1): Progressing toward goal SLP Short Term Goal 3 (Week 1): Pt will use compensatory strategies to increase speech intelligibility to 50% at the word level with Max A multimodal cues. SLP Short Term Goal 3 - Progress (Week 1): Met SLP Short Term Goal 4 (Week 1): Pt will recall 1 safety precaution with Mod A cues for use of strategies and/or aids. SLP Short Term Goal 4 - Progress (Week 1): Progressing toward goal SLP Short Term Goal 5 (Week 1): Pt will demonstrate ability to problem solve basic functional and familiar situations with Mod A cues. SLP Short Term Goal 5 - Progress (Week 1): Met SLP Short Term Goal 6 (Week 1): Pt will identify 1 physical and 1 cognitive-linguistic deficit in setting of acute CVA with Mod A cues. SLP Short Term Goal 6 - Progress (Week 1): Progressing toward goal  Skilled Therapeutic Interventions:   Patient seen to address dysphagia goals with SLP assessing his ability to masticate and orally manipulate solids (saltine crackers). Patient has upper and lower dentures which both appear to be fitting well. He was able to masticate crackers and performed lingual sweep without cues, as well as alternating with sips of water. He did not exhibit any coughing, throat clearing but did have  mastication delay and delays in oral transit. SLP cleaned patient's dentures after PO's and there was mild-mod amount of residuals on top denture and bottoms. Patient stated, "that's the first thing Ive chewed since April, or August". Patient continues to benefit from skilled SLP intervention to maximize swallow function and cognitive-linguistic and speech prior to discharge.  Pain Pain Assessment Pain Scale: 0-10 Pain Score: 0-No pain  Therapy/Group: Individual Therapy   Sonia Baller, MA, CCC-SLP Speech Therapy

## 2020-10-26 NOTE — Progress Notes (Signed)
[place] PHYSICAL MEDICINE & REHABILITATION PROGRESS NOTE   Subjective/Complaints:  C/o low back pain, had this at home, gets some relief with tylenol   ROS: Limited due to [place] (has pocket talker on )  Objective:   DG Swallowing Func-Speech Pathology  Result Date: 10/24/2020 Objective Swallowing Evaluation: Type of Study: MBS-Modified Barium Swallow Study  Patient Details Name: Fernando [name] MRN: [id number] Date of Birth: [birth year] Today's Date: 10/24/2020 Past Medical History: Past Medical History: Diagnosis Date . Arthritis  . Atrial fibrillation ([place])  . Carotid artery disease ([place])   Dr. Kellie Simmering - s/p left CEA . Coronary artery disease  . Essential hypertension, benign  . Hyperlipidemia  . 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 This mass was initially felt to be 1.7 cm at the time of presentation but had enlarged significantly to 4 cm clinically at the time of surgery. Pathologically it was 3.4 cm. A lymph node deep to the parotid was enlarged clinically at the time of surgery but was negative  . Tachycardia-bradycardia syndrome [facility])  Past Surgical History: Past Surgical History: Procedure Laterality Date . CAROTID ENDARTERECTOMY Left Jan. 3, 2009  CE . CATARACT EXTRACTION W/ INTRAOCULAR LENS IMPLANT    Bilateral . ESOPHAGOGASTRODUODENOSCOPY N/A 03/29/2018  Procedure: ESOPHAGOGASTRODUODENOSCOPY (EGD);  Surgeon: Danie Binder, MD;  [facility];  Service: Endoscopy;  Laterality: N/A; . EYE SURGERY   . INSERT / REPLACE / REMOVE PACEMAKER   . IR INTRAVSC STENT CERV CAROTID W/EMB-PROT MOD SED INCL ANGIO  10/06/2020 . IR US GUIDE VASC ACCESS LEFT  10/06/2020 . IR US GUIDE VASC ACCESS RIGHT  10/06/2020 . IR US GUIDE VASC ACCESS RIGHT  10/06/2020 . PACEMAKER INSERTION  10-01-2012 . PARATHYROIDECTOMY  10/28/2012 . PAROTIDECTOMY  10/28/2012   Procedure: PAROTIDECTOMY;  Surgeon: Ascencion Dike, MD;  Location: [place];  Service: ENT;  Laterality: Right;  Total Parotidectomy . PERMANENT PACEMAKER INSERTION N/A 10/01/2012  Procedure: PERMANENT PACEMAKER INSERTION;  Surgeon: Evans Lance, MD;  Location: [facility] CATH LAB;  Service: Cardiovascular;  Laterality: N/A; . RADIOLOGY WITH ANESTHESIA N/A 10/07/2020  Procedure: MRI WITH ANESTHESIA;  Surgeon: Radiologist, Medication, MD;  Location: [place];  Service: Radiology;  Laterality: N/A; . RADIOLOGY WITH ANESTHESIA N/A 10/06/2020  Procedure: IR WITH ANESTHESIA;  Surgeon: Luanne Bras, MD;  Location: [place];  Service: Radiology;  Laterality: N/A; . TEE WITHOUT CARDIOVERSION N/A 10/10/2020  Procedure: TRANSESOPHAGEAL ECHOCARDIOGRAM (TEE);  Surgeon: Lelon Perla, MD;  Location: [facility] ENDOSCOPY;  Service: Cardiovascular;  Laterality: N/A; HPI: The pt is a 86 yo male presenting with L sided weakness and AMS. Upon work-up, found to have large R MCA stenosis and is now s/p stenting. Pt intubated for procedure 11/12 and extubated 11/13. PMH includes: a fib on Xarelto, CAD, HTN, HLD, parotidectomy 10/2012, pacemaker 10/2012. Pt admitted to [place] 10/14/20.  Subjective: Pt was pleasant and cooperative Assessment / Plan / Recommendation CHL IP CLINICAL IMPRESSIONS 10/24/2020 Clinical Impression Pt presents with mild-mod oropharyngeal dysphagia that is largely unchanged in function since last MBSS conducted 10/09/20. MBSS was repeated today due to concerns for increase in coughing at bedside. Current pharyngeal deficits are suspected due to baseline structural abnormalities of cervical spine and hx of oral cancer with radiation treatments, exacerbated by weakness in setting of acute CVA. Pt's dysphagia is characterized by premature spillage and reduced epiglottic deflection, causing various liquid textures to penetrate into  the laryngeal vestibule before the swallow. Although no aspiration was observed throughout study, consistent  throat clearing and coughing noted after each presentation of barium, which is also very consistent with his bedside presentation. Nectar barium appeared to result in a larger volume of penetration and was more difficult for pt to clear from vestibule with cough response in comparison to thin barium. Pt requires cueing to use slower rate, one sip at a time, and straw facilitated most efficient intake due to decreased labial seal and oral/facial weakness. Pt unable to masticate a trial of Dys 3 (mechanical soft) solids, ultimately leading to oral expectoration of PO. Dys 1 (puree) textures were consumed most efficiently and with no airway intrusion, but some pharyngeal residue noted. In addition, reduced upper esophageal sphincter (UES) opening was observed secondary to appearance of cervical lordosis. Pt consumed a barium pill whole in applesauce with efficient oral transit, but on brief scan of esophagus, it was observed to remain in upper half of esophagus until pt took additional sips of thin liquid. Recommend pt continue with current Dys 1 (puree) texture diet and thin liquids. Pt should follow solids with liquids (including pills) and produce frequent volitional coughing after POs to assist with clearance of potential penetrates. Pt should also use slow rate and take rest breaks as needed to coordinate breathing and swallowing sequence, given his decreased respiratory status and CHF dx. SLP will continue to provide skilled interventions to ensure diet safety, efficiency, and to work toward advancement and increased independence with use of compensatory swallow strategies. SLP Visit Diagnosis Dysphagia, oropharyngeal phase (R13.12) Attention and concentration deficit following -- Frontal lobe and executive function deficit following -- Impact on safety and function Moderate aspiration risk   CHL IP TREATMENT RECOMMENDATION 10/09/2020 Treatment Recommendations Therapy as outlined in treatment plan below    Prognosis 10/09/2020 Prognosis for Safe Diet Advancement Fair Barriers to Reach Goals -- Barriers/Prognosis Comment -- CHL IP DIET RECOMMENDATION 10/24/2020 SLP Diet Recommendations Dysphagia 1 (Puree) solids;Thin liquid Liquid Administration via Straw Medication Administration Crushed with puree Compensations Follow solids with liquid;Slow rate;Small sips/bites;Minimize environmental distractions;Hard cough after swallow Postural Changes Remain semi-upright after after feeds/meals (Comment);Seated upright at 90 degrees   CHL IP OTHER RECOMMENDATIONS 10/24/2020 Recommended Consults -- Oral Care Recommendations Oral care BID Other Recommendations --   CHL IP FOLLOW UP RECOMMENDATIONS 10/12/2020 Follow up Recommendations Inpatient Rehab   CHL IP FREQUENCY AND DURATION 10/09/2020 Speech Therapy Frequency (ACUTE ONLY) min 2x/week Treatment Duration 2 weeks      CHL IP ORAL PHASE 10/24/2020 Oral Phase Impaired Oral - Pudding Teaspoon -- Oral - Pudding Cup -- Oral - Honey Teaspoon NT Oral - Honey Cup -- Oral - Nectar Teaspoon -- Oral - Nectar Cup Lingual/palatal residue;Premature spillage;Weak lingual manipulation Oral - Nectar Straw NT Oral - Thin Teaspoon -- Oral - Thin Cup Right anterior bolus loss;Left anterior bolus loss;Weak lingual manipulation;Lingual/palatal residue;Piecemeal swallowing;Premature spillage Oral - Thin Straw Premature spillage;Weak lingual manipulation Oral - Puree Weak lingual manipulation Oral - Mech Soft Impaired mastication;Lingual/palatal residue;Reduced posterior propulsion;Weak lingual manipulation Oral - Regular -- Oral - Multi-Consistency -- Oral - Pill Weak lingual manipulation Oral Phase - Comment --  CHL IP PHARYNGEAL PHASE 10/24/2020 Pharyngeal Phase Impaired Pharyngeal- Pudding Teaspoon -- Pharyngeal -- Pharyngeal- Pudding Cup -- Pharyngeal -- Pharyngeal- Honey Teaspoon NT Pharyngeal -- Pharyngeal- Honey Cup -- Pharyngeal -- Pharyngeal- Nectar Teaspoon -- Pharyngeal -- Pharyngeal-  Nectar Cup Reduced airway/laryngeal closure;Reduced anterior laryngeal mobility;Delayed swallow initiation-pyriform sinuses;Delayed swallow initiation-vallecula;Penetration/Aspiration before swallow;Reduced epiglottic  inversion;Pharyngeal residue - valleculae Pharyngeal Material enters airway, CONTACTS cords and not ejected out;Material enters airway, CONTACTS cords and then ejected out Pharyngeal- Nectar Straw NT Pharyngeal -- Pharyngeal- Thin Teaspoon -- Pharyngeal -- Pharyngeal- Thin Cup Reduced epiglottic inversion;Delayed swallow initiation-pyriform sinuses;Reduced anterior laryngeal mobility;Reduced airway/laryngeal closure;Penetration/Aspiration before swallow Pharyngeal Material enters airway, CONTACTS cords and then ejected out;Material enters airway, remains ABOVE vocal cords then ejected out Pharyngeal- Thin Straw Penetration/Aspiration before swallow;Reduced airway/laryngeal closure;Reduced anterior laryngeal mobility;Reduced epiglottic inversion Pharyngeal Material enters airway, remains ABOVE vocal cords then ejected out Pharyngeal- Puree Pharyngeal residue - valleculae;Pharyngeal residue - cp segment Pharyngeal -- Pharyngeal- Mechanical Soft Other (Comment) Pharyngeal -- Pharyngeal- Regular -- Pharyngeal -- Pharyngeal- Multi-consistency -- Pharyngeal -- Pharyngeal- Pill WFL Pharyngeal -- Pharyngeal Comment --  CHL IP CERVICAL ESOPHAGEAL PHASE 10/24/2020 Cervical Esophageal Phase -- Pudding Teaspoon -- Pudding Cup -- Honey Teaspoon -- Honey Cup -- Nectar Teaspoon -- Nectar Cup -- Nectar Straw -- Thin Teaspoon -- Thin Cup -- Thin Straw -- Puree -- Mechanical Soft -- Regular -- Multi-consistency -- Pill -- Cervical Esophageal Comment Reduced UES opening [name] 10/24/2020, 10:26 AM              Recent Labs    10/25/20 0641  WBC 4.8  HGB 9.2*  HCT 30.8*  PLT 262   No results for input(s): NA, K, CL, CO2, GLUCOSE, BUN, CREATININE, CALCIUM in the last 72 hours.  Intake/Output Summary (Last  24 hours) at 10/26/2020 0753 Last data filed at 10/26/2020 0500 Gross per 24 hour  Intake 580 ml  Output --  Net 580 ml     Pressure Injury 10/14/20 Buttocks Left Deep Tissue Pressure Injury - Purple or maroon localized area of discolored intact skin or blood-filled blister due to damage of underlying soft tissue from pressure and/or shear. Purple/red area small fluid filled  (Active)  10/14/20 1700  Location: Buttocks  Location Orientation: Left  Staging: Deep Tissue Pressure Injury - Purple or maroon localized area of discolored intact skin or blood-filled blister due to damage of underlying soft tissue from pressure and/or shear.  Wound Description (Comments): Purple/red area small fluid filled blister noted at center  Present on Admission: Yes    Physical Exam: Vital Signs Blood pressure (!) 152/57, pulse 63, temperature 98.2 F (36.8 C), temperature source Oral, resp. rate 15, height 5\' 5"  (1.651 m), weight 62.7 kg, SpO2 99 %.   General: No acute distress Mood and affect are appropriate Heart: Regular rate and rhythm no rubs murmurs or extra sounds Lungs: Clear to auscultation, breathing unlabored, no rales or wheezes Abdomen: Positive bowel sounds, soft nontender to palpation, nondistended Extremities: No clubbing, cyanosis, or edema Skin: No evidence of breakdown, no evidence of rash   Neuro: Alert Extremely HOH Motor: Limited due to ability to follow commands-RUE: 5/5 proximal distal RLE: 4 -/5 proximal to distal LUE: 4 /5 proximal distal, appears unchanged LLE:  3/5 proximal distal   Assessment/Plan: 1. Functional deficits which require 3+ hours per day of interdisciplinary therapy in a comprehensive inpatient rehab setting.  Physiatrist is providing close team supervision and 24 hour management of active medical problems listed below.  Physiatrist and rehab team continue to assess barriers to discharge/monitor patient progress toward functional and medical  goals  Care Tool:  Bathing  Bathing activity did not occur: Refused Body parts bathed by patient: Right arm, Left arm, Chest, Abdomen, Face   Body parts bathed by helper: Buttocks, Right lower leg, Left lower leg, Left  upper leg, Right upper leg Body parts n/a: Front perineal area, Buttocks, Right upper leg, Left upper leg, Right lower leg, Left lower leg, Face (did not attempt LB bathing 2/2 time constraints)   Bathing assist Assist Level: Minimal Assistance - Patient > 75%     Upper Body Dressing/Undressing Upper body dressing   What is the patient wearing?: Pull over shirt    Upper body assist Assist Level: Maximal Assistance - Patient 25 - 49%    Lower Body Dressing/Undressing Lower body dressing      What is the patient wearing?: Pants     Lower body assist Assist for lower body dressing: Total Assistance - Patient < 25%     Toileting Toileting    Toileting assist Assist for toileting: Total Assistance - Patient < 25%     Transfers Chair/bed transfer  Transfers assist     Chair/bed transfer assist level: 2 Helpers     Locomotion Ambulation   Ambulation assist   Ambulation activity did not occur: Safety/medical concerns  Assist level: 2 helpers Assistive device: Walker-rolling Max distance: 3   Walk 10 feet activity   Assist  Walk 10 feet activity did not occur: Safety/medical concerns  Assist level: 2 helpers Assistive device: Walker-rolling   Walk 50 feet activity   Assist           Walk 150 feet activity   Assist Walk 150 feet activity did not occur: Safety/medical concerns         Walk 10 feet on uneven surface  activity   Assist Walk 10 feet on uneven surfaces activity did not occur: Safety/medical concerns         Wheelchair     Assist Will patient use wheelchair at discharge?: Yes Type of Wheelchair: Manual    Wheelchair assist level: Minimal Assistance - Patient > 75% Max wheelchair distance: 25     Wheelchair 50 feet with 2 turns activity    Assist        Assist Level: Moderate Assistance - Patient 50 - 74%   Wheelchair 150 feet activity     Assist      Assist Level: Moderate Assistance - Patient 50 - 74%    Medical Problem List and Plan: 1.Left side hemiparesis dysarthria/dysphagiasecondary to right MCA infarction due to right ICA high-grade stenosis status post angioplasty  Continue CIR PT, OT, SLP ELOS 12/16  2. Antithrombotics: -DVT/anticoagulation:Per neurology stopping Xarelto  -antiplatelet therapy: Aspirin81 mg daily 3. Pain Management:Tylenol as needed.CLBP add Kpad, also with lidoderm patch   4. Mood:Provide emotional support -antipsychotic agents: N/A 5. Neuropsych: This patientis notcapable of making decisions on hisown behalf. 6. Skin/Wound Care:Routine skin checks 7. Fluids/Electrolytes/Nutrition:Routine in and outs  8. Atrial fibrillation/tachybradycardia syndrome/pacemaker. Per Neuro and cardiology will resume Xarelto and monitor Hgb  Continue Toprol25 mg daily  Rate controlled on 11/28 9. SCC of the carotid gland. Follow-up outpatient 10. Hypertension. Toprol 25 mg daily.   Cozaar 25 started on 11/27   Vitals:   10/25/20 2012 10/26/20 0437  BP: 106/66 (!) 152/57  Pulse: 63 63  Resp: 17 15  Temp: 98.8 F (37.1 C) 98.2 F (36.8 C)  SpO2: 100% 99%  monitor trend 12/2  11. Hypothyroidism. Synthroid 12. Bacteremia.Completed Ancef 2 g every 8 hours 10-daycourse 13.  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 Dysphagia # 1 thins liquids. May resume diet, drank H20 for me through a straw without cough or wet voice 15. CKD stage II/III. Baseline 1.30-1.48.  Creatinine 0.98 on 11/27 16. Acute blood loss anemia with history of GI bleed 2019. Patient transfused 1 unit packed red blood cells 10/12/2020. Continue iron supplement. Protonix 40 mg twice daily.   Hemoglobin 8.9 on  11/28 stable vs 11/22 ok to resume Xarelto as rec per Neuro and cardio  17. Prediabetes. Hemoglobin A1c 5.7. SSI.   Slightly labile, but relatively controlled on 11/28   18. Chronic  systolic congestive heart failure-compensated, cards has signed off   Lasix 20 daily per cardiology, appreciate recs Filed Weights   10/23/20 0500 10/25/20 0553 10/26/20 0521  Weight: 64 kg 63.8 kg 62.7 kg    Stable on 12/2- no sig peripheral edema, no dyspnea noted at rest , still with diminished exercise tolerance as expected   LOS: 12 days A FACE TO [place] E Liza Czerwinski 10/26/2020, 7:53 AM

## 2020-10-26 NOTE — Progress Notes (Signed)
Physical Therapy Session Note  Patient Details  Name: Fernando [name] MRN: [id number] Date of Birth: 09/23/1927  Today's Date: 10/26/2020 PT Individual Time: 0277-4128 PT Individual Time Calculation (min): 43 min   Short Term Goals: Week 2:  PT Short Term Goal 1 (Week 2): Pt will be able to perform bed <> chair transfer with mod assist PT Short Term Goal 2 (Week 2): Patient will complete sit <> stand with MinA and LRAD PT Short Term Goal 3 (Week 2): Patient will ambulate >71ft with MinA, wc follow if needed, LRAD  Skilled Therapeutic Interventions/Progress Updates:    Pt received sitting in w/c with his granddaughter present and pt agreeable to therapy session despite reporting fatigue. Encouraged pt for timed toileting to improve continence and pt in agreement. Transported into bathroom in w/c. L stand pivot w/c>BSC over toilet using RW with +2 mod/max assist due to strong posterior lean and inability to correct despite cuing - total assist LB clothing management. Pt continent of bowels. Sit>stand 1x BSC over toilet>RW with +2 max assist for lifting into stand with pt only able to tolerate standing ~20seconds for total assist peri-care. Attempted sit>stand 2 additional times in same manner but pt maintained significant forward trunk flexion and unable to attempt trunk/hip extension to stand upright despite +2 max assist therefore therapist retrieved stedy to safely assist pt. Sit>stand BSC over toilet>stedy with +2 max assist for lifting and pt able to just barely clear hips in order for therapist to place stedy seat. Sit>stand from stedy seat 2x with +2 mod assist for lifting to complete peri-care and LB clothing management - continues to only be able to stand for <1 minute each bout due to significant endurance impairments and continues to demonstrate dyspnea after exertion. Pt requests for assistance to bed due to fatigue. Stedy transport to bed. Sit>supine with max assist for trunk descent and B  LE management via reverse logroll; however, pt reports back pain during this movement. Therapist assisted with repositioning pt for pain management and left supine in bed with needs in reach, HOB elevated, B UEs supported on pillows for edema management, and bed alarm on.   Of note: Pt's R 5th finger nail started bleeding during session with pt reporting that his daughter accidentally cut him while clipping his nails yesterday. Pt reports he had a bandage on that finger earlier today but had removed it and believes that performing mobility tasks caused it to start bleeding again. Therapist reapplied a Band-Aid to that finger.   Therapy Documentation Precautions:  Precautions Precautions: ICD/Pacemaker, Fall Precaution Comments: L sided weakness; very HOH; dysarthria Restrictions Weight Bearing Restrictions: No  Pain:   Reports back pain after performing sit>supine - therapist assisted pt in repositioning for pain management.    Therapy/Group: Individual Therapy  Tawana Scale , PT, DPT, CSRS  10/26/2020, 12:23 PM

## 2020-10-26 NOTE — Progress Notes (Addendum)
Occupational Therapy Session Note  Patient Details  Name: Fernando [name] MRN: [id number] Date of Birth: Apr 22, 1927  Today's Date: 10/26/2020 OT Individual Time: 1002-1104 OT Individual Time Calculation (min): 62 min    Short Term Goals: Week 2:  OT Short Term Goal 1 (Week 2): Pt will don shirt with mod A OT Short Term Goal 2 (Week 2): Pt will complete self feeding with LRAD with min A OT Short Term Goal 3 (Week 2): Pt will complete sit to stand for progressing of LB selfcare with max assist. OT Short Term Goal 4 (Week 2): Pt will perform squat pivot transfer to the drop arm commode with max assist.  Skilled Therapeutic Interventions/Progress Updates:    Pt received side-lying in bed with nursing staff and granddaughter present.   Func Mobility: Side roll to R with min A to bend BLE and for L hand placement on bed rail. Sup>sit with mod A to lift trunk and move BUE off bed. Maintains static sitting balance with intermittent mod A to reorient to midline due to L lean. Pt satO2 levels at 95-100 % on RA throughout session with HR in mid 60s bpm. Squat-pivot transfer from EOB to w/c with max A to encourage trunk flexion, weight shifting, and LLE block.   Dressing: Donned bilat hearing aids with total A 2/2 time constraints. Seated EOB, donned pants with total A +2 (pt 25%)  to thread BLE and to encourage trunk flexion to reach. Sit<>stand with total A x2 + RW (pt 25%) to maintain upright posture and LLE block to pull up pants.  Therac: Transported to Day Room in w/c 2/2 time constraints + energy conservation. Completed sit<>stand x 2 with BUE supported on table with total A x2 (pt 25%) to maintain upright posture and BLE block. Demonstrated improved upright posture on 2nd attempt. In standing, pt retrieved 3 cups on L side to return to therapist at above-head level on R side x3 to target weight-shifting. Completed 3 more reps seated in w/c. Noted significant L lean in both standing and seated  posture. Pt req freq seated rest breaks throughout session. Took sip from cup with lid/straw with BUE with setup A.   Pt left in w/c with BUE supported on pillows, adapted call bell in reach, and lap belt alarm engaged.   Therapy Documentation Precautions:  Precautions Precautions: ICD/Pacemaker, Fall Precaution Comments: L sided weakness; very HOH; dysarthria Restrictions Weight Bearing Restrictions: No  Pain: No c/o pain.  ADL: See Care Tool for more details.   Therapy/Group: Individual Therapy  Volanda Napoleon MS, OTR/L Clyda Greener OTR/L  10/26/2020, 7:25 AM

## 2020-10-27 ENCOUNTER — Ambulatory Visit: Payer: PPO | Admitting: Internal Medicine

## 2020-10-27 ENCOUNTER — Inpatient Hospital Stay (HOSPITAL_COMMUNITY): Payer: PPO | Admitting: Speech Pathology

## 2020-10-27 ENCOUNTER — Inpatient Hospital Stay (HOSPITAL_COMMUNITY): Payer: PPO | Admitting: Occupational Therapy

## 2020-10-27 ENCOUNTER — Inpatient Hospital Stay (HOSPITAL_COMMUNITY): Payer: PPO | Admitting: Physical Therapy

## 2020-10-27 LAB — GLUCOSE, CAPILLARY
Glucose-Capillary: 113 mg/dL — ABNORMAL HIGH (ref 70–99)
Glucose-Capillary: 157 mg/dL — ABNORMAL HIGH (ref 70–99)
Glucose-Capillary: 106 mg/dL — ABNORMAL HIGH (ref 70–99)
Glucose-Capillary: 109 mg/dL — ABNORMAL HIGH (ref 70–99)

## 2020-10-27 NOTE — Progress Notes (Signed)
[place] PHYSICAL MEDICINE & REHABILITATION PROGRESS NOTE   Subjective/Complaints:  No issue overnite, back is not hurting today   ROS: Limited due to Fernando [name] (has pocket talker on )  Objective:   No results found. Recent Labs    10/25/20 0641  WBC 4.8  HGB 9.2*  HCT 30.8*  PLT 262   No results for input(s): NA, K, CL, CO2, GLUCOSE, BUN, CREATININE, CALCIUM in the last 72 hours.  Intake/Output Summary (Last 24 hours) at 10/27/2020 0755 Last data filed at 10/26/2020 1740 Gross per 24 hour  Intake 240 ml  Output --  Net 240 ml     Pressure Injury 10/14/20 Buttocks Left Deep Tissue Pressure Injury - Purple or maroon localized area of discolored intact skin or blood-filled blister due to damage of underlying soft tissue from pressure and/or shear. Purple/red area small fluid filled  (Active)  10/14/20 1700  Location: Buttocks  Location Orientation: Left  Staging: Deep Tissue Pressure Injury - Purple or maroon localized area of discolored intact skin or blood-filled blister due to damage of underlying soft tissue from pressure and/or shear.  Wound Description (Comments): Purple/red area small fluid filled blister noted at center  Present on Admission: Yes    Physical Exam: Vital Signs Blood pressure (!) 153/58, pulse 63, temperature 97.9 F (36.6 C), temperature source Oral, resp. rate 15, height 5\' 5"  (1.651 m), weight 63.5 kg, SpO2 95 %.   General: No acute distress Mood and affect are appropriate Heart: Regular rate and rhythm no rubs murmurs or extra sounds Lungs: Clear to auscultation, breathing unlabored, no rales or wheezes Abdomen: Positive bowel sounds, soft nontender to palpation, nondistended Extremities: No clubbing, cyanosis, or edema Skin: No evidence of breakdown, no evidence of rash   Neuro: Alert Extremely HOH Motor: Limited due to ability to follow commands-RUE: 5/5 proximal distal RLE: 4 -/5 proximal to distal LUE: 4 /5 proximal distal,  appears unchanged LLE:  3/5 proximal distal   Assessment/Plan: 1. Functional deficits which require 3+ hours per day of interdisciplinary therapy in a comprehensive inpatient rehab setting.  Physiatrist is providing close team supervision and 24 hour management of active medical problems listed below.  Physiatrist and rehab team continue to assess barriers to discharge/monitor patient progress toward functional and medical goals  Care Tool:  Bathing  Bathing activity did not occur: Refused Body parts bathed by patient: Right arm, Left arm, Chest, Abdomen, Face   Body parts bathed by helper: Buttocks, Right lower leg, Left lower leg, Left upper leg, Right upper leg Body parts n/a: Front perineal area, Buttocks, Right upper leg, Left upper leg, Right lower leg, Left lower leg, Face (did not attempt LB bathing 2/2 time constraints)   Bathing assist Assist Level: Minimal Assistance - Patient > 75%     Upper Body Dressing/Undressing Upper body dressing   What is the patient wearing?: Pull over shirt    Upper body assist Assist Level: Maximal Assistance - Patient 25 - 49%    Lower Body Dressing/Undressing Lower body dressing      What is the patient wearing?: Pants     Lower body assist Assist for lower body dressing: 2 Helpers     Toileting Toileting    Toileting assist Assist for toileting: Total Assistance - Patient < 25%     Transfers Chair/bed transfer  Transfers assist     Chair/bed transfer assist level: Dependent - mechanical lift (stedy)     Locomotion Ambulation   Ambulation assist  Ambulation activity did not occur: Safety/medical concerns  Assist level: 2 helpers Assistive device: Walker-rolling Max distance: 3   Walk 10 feet activity   Assist  Walk 10 feet activity did not occur: Safety/medical concerns  Assist level: 2 helpers Assistive device: Walker-rolling   Walk 50 feet activity   Assist           Walk 150 feet  activity   Assist Walk 150 feet activity did not occur: Safety/medical concerns         Walk 10 feet on uneven surface  activity   Assist Walk 10 feet on uneven surfaces activity did not occur: Safety/medical concerns         Wheelchair     Assist Will patient use wheelchair at discharge?: Yes Type of Wheelchair: Manual    Wheelchair assist level: Minimal Assistance - Patient > 75% Max wheelchair distance: 25    Wheelchair 50 feet with 2 turns activity    Assist        Assist Level: Moderate Assistance - Patient 50 - 74%   Wheelchair 150 feet activity     Assist      Assist Level: Moderate Assistance - Patient 50 - 74%    Medical Problem List and Plan: 1.Left side hemiparesis dysarthria/dysphagiasecondary to right MCA infarction due to right ICA high-grade stenosis status post angioplasty  Continue CIR PT, OT, SLP ELOS 12/16  2. Antithrombotics: -DVT/anticoagulation:Per neurology stopping Xarelto  -antiplatelet therapy: Aspirin81 mg daily 3. Pain Management:Tylenol as needed.CLBP add Kpad, also with lidoderm patch   4. Mood:Provide emotional support -antipsychotic agents: N/A 5. Neuropsych: This patientis notcapable of making decisions on hisown behalf. 6. Skin/Wound Care:Routine skin checks 7. Fluids/Electrolytes/Nutrition:Routine in and outs  8. Atrial fibrillation/tachybradycardia syndrome/pacemaker. Per Neuro and cardiology will resume Xarelto and monitor Hgb  Continue Toprol25 mg daily  Rate controlled on 11/28 9. SCC of the carotid gland. Follow-up outpatient 10. Hypertension. Toprol 25 mg daily.   Cozaar 25 started on 11/27- increased to 50mg  on 12/2   Vitals:   10/26/20 2009 10/27/20 0332  BP: (!) 103/58 (!) 153/58  Pulse: 63 63  Resp: 15 15  Temp: 97.8 F (36.6 C) 97.9 F (36.6 C)  SpO2: 92% 95%  monitor effect of med change   11. Hypothyroidism. Synthroid 12.  Bacteremia.Completed Ancef 2 g every 8 hours 10-daycourse 13.  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 Dysphagia # 1 thins liquids. May resume diet, drank H20 for me through a straw without cough or wet voice 15. CKD stage II/III. Baseline 1.30-1.48.   Creatinine 0.98 on 11/27 16. Acute blood loss anemia with history of GI bleed 2019. Patient transfused 1 unit packed red blood cells 10/12/2020. Continue iron supplement. Protonix 40 mg twice daily.   Hemoglobin 8.9 on 11/28 stable vs 11/22 ok to resume Xarelto as rec per Neuro and cardio Xarelto started 11/29 repeat CBC in am   17. Prediabetes. Hemoglobin A1c 5.7. SSI.   Slightly labile, but relatively controlled on 11/28   18. Chronic  systolic congestive heart failure-compensated, cards has signed off   Lasix 20 daily per cardiology, appreciate recs Filed Weights   10/25/20 0553 10/26/20 0521 10/27/20 0332  Weight: 63.8 kg 62.7 kg 63.5 kg    Stable on 12/2- no sig peripheral edema, no dyspnea noted at rest , still with diminished exercise tolerance as expected   LOS: 13 days A FACE TO [place] 10/27/2020, 7:55 AM

## 2020-10-27 NOTE — Progress Notes (Addendum)
Occupational Therapy Weekly Progress Note  Patient Details  Name: Fernando [name] MRN: [id number] Date of Birth: 1927/06/03  Beginning of progress report period: October 20, 2020 End of progress report period: October 27, 2020  Today's Date: 10/27/2020 OT Individual Time: 0904-1000 OT Individual Time Calculation (min): 56 min    Patient has met 2 of 4 short term goals.  Pt is moderately progressing towards LTG. Assist levels for self-care and func mobility con to vary drastically depending on fatigue level currently performing sit<>stands and squat-pivot transfers from max to total A + 2 (pt 20%). Demonstrating overall improved functional use of BUE and decrease in BUE edema ranging form set-up A to min A for self-feeding, seated grooming, UB dressing.  Patient continues to demonstrate the following deficits: muscle weakness, decreased cardiorespiratoy endurance and decreased motor planning,decreased dynamic/static sitting balance, and L lean and therefore will continue to benefit from skilled OT intervention to enhance overall performance with BADL, iADL and Reduce care partner burden.  Patient progressing toward long term goals. One goal updated based on progress  Continue plan of care.  OT Short Term Goals Week 3:  OT Short Term Goal 1 (Week 3): Pt will complete sit to stand for progressing of LB selfcare with max assist. OT Short Term Goal 2 (Week 3): Pt will perform squat pivot transfer to the drop arm commode with max assist. OT Short Term Goal 3 (Week 3): Pt will thread 1 lower extremity when donning pants with mod A. OT Short Term Goal 4 (Week 3): Pt will don shirt with supervision.  Skilled Therapeutic Interventions/Progress Updates:    Pt received in bed with bed alarm engaged and son present. Bed mobility: Roll R with min A to bend BLE and for L hand placement on rail. Sup > sit EOB with mod A to lift trunk, move BLE off bed. Demonstrated improved activity tolerance this date by  maintain static sitting balance EOB for ~10 min with close supervision 2/2 limited activity tolerance.  Dressing: Doffed long-sleeve shirt with close supervision. Donned long-sleeve shirt with min A to pull shirt down. Donned voice amplifier with total A from therapist.   Toileting: Sit<>stand x5 during toilet transfer, posterior pericare, and w/c transfer in stedy with mod A to power up, maintain upright posture. Req total A for clothing management and posterior pericare after BM. Washed hands seated in w/c at sink with set-up A.   NMR: In w/c used LUE to wash/dry full side table with set-up A. Drank from cup with lid/straw 5x using LUE. Placed weighted cup from L to R sides of table using LUE with close supervision.  Left pt in w/c with lap belt alarm engaged, BUE supported on pillows, adapted call bell in reach, and son present.  Therapy Documentation Precautions:  Precautions Precautions: ICD/Pacemaker, Fall Precaution Comments: L sided weakness; very HOH; dysarthria Restrictions Weight Bearing Restrictions: No  Pain:   ADL: See Care Tool for more details.  Therapy/Group: Individual Therapy  Heidemarie Goodnow MS, OTR/L  10/27/2020, 4:05 PM

## 2020-10-27 NOTE — Progress Notes (Signed)
Physical Therapy Session Note  Patient Details  Name: Fernando [name] MRN: [id number] Date of Birth: 1927-09-02  Today's Date: 10/27/2020 PT Individual Time: 8335-8251 PT Individual Time Calculation (min): 32 min   Short Term Goals: Week 2:  PT Short Term Goal 1 (Week 2): Pt will be able to perform bed <> chair transfer with mod assist PT Short Term Goal 2 (Week 2): Patient will complete sit <> stand with MinA and LRAD PT Short Term Goal 3 (Week 2): Patient will ambulate >3ft with MinA, wc follow if needed, LRAD  Skilled Therapeutic Interventions/Progress Updates:    Pt received sitting in w/c with his son present and pt's son reports pt is not having a good day, despite this pt agreeable to therapy session. Pt wearing amplifier during session for improved hearing. Pt's son reports that at home pt would have 1 really good day where he was able to perform tasks without AD, then the next day would require a RW, and then on the 3rd day he would not be able to get out of bed. Pt noted to be leaning R anteriorly in the w/c with decreased back support and pt reporting increasing back pain. Therapist provided pt with 18x18 TIS w/c with contoured back support and head rest for improved trunk and pelvis alignment to decrease pain and improved pt's upright, OOB activity tolerance - will continue to use Roho cushion along with tilt feature of new w/c for increased pressure relief. Sit>stand w/c>stedy with max assist for lifting into standing. Therapist encouraged pt to go to bathroom per timed toileting schedule. Stedy transport in/out of bathroom. Sit>stand BSC>stedy 2x with heavy max assist for lifting to stand and mod progressing to max assist for standing balance due to worsening posterior lean while +2 assist performed total assist peri-care - incontinent of bladder in brief and continent of bowels. Sit>stand from stedy seat with mod assist for lifting and then total assist for LB clothing management.  Therapist educated pt's son on timed toileting schedule to improve pt's bowel/bladder control. Pt therapeutically positioned tilted back in TIS w/c with pillows under BUEs for edema management, seat belt alarm on, and pt's son present.   Therapy Documentation Precautions:  Precautions Precautions: ICD/Pacemaker, Fall Precaution Comments: L sided weakness; very HOH; dysarthria Restrictions Weight Bearing Restrictions: No  Pain:   Reports back pain - therapist provided different wheelchair for improved back support.   Therapy/Group: Individual Therapy  Tawana Scale , PT, DPT, CSRS  10/27/2020, 7:58 AM

## 2020-10-27 NOTE — Progress Notes (Signed)
Speech Language Pathology Daily Session Note  Patient Details  Name: Fernando [name] MRN: [id number] Date of Birth: January 07, 1927  Today's Date: 10/27/2020 SLP Individual Time: 8032-1224 SLP Individual Time Calculation (min): 30 min  Short Term Goals: Week 2: SLP Short Term Goal 1 (Week 2): Pt will use compensatory swallowing strategies with current diet with Min A verbal cues. SLP Short Term Goal 2 (Week 2): Pt will demonstrate efficient mastication, oral clearance, and no decline in respiratory status when cosuming trials of Dys 2 textures across at least 2 session prior to upgrade. SLP Short Term Goal 3 (Week 2): Pt will use compensatory strategies to increase speech intelligibility to 75% at the phrase level with Mod A multimodal cues. SLP Short Term Goal 4 (Week 2): Pt will recall 1 safety precaution with Mod A cues for use of strategies and/or aids. SLP Short Term Goal 5 (Week 2): Pt will identify 1 physical and 1 cognitive-linguistic deficit in setting of acute CVA with Mod A cues. SLP Short Term Goal 6 (Week 2): Pt will demonstrate ability to problem solve basic functional and familiar situations with Min A cues.  Skilled Therapeutic Interventions:   Patient seen to address swallow function goals with trials of dys 2/3 textures. Patient masticated one small cracker with prolonged mastication and oral control and minimal oral residuals throughout (on tongue, along gumline). He exhibited intermittent dry sounding cough during oral intake of solids. Liquid sips (water) did help with clearance of oral residuals and overall, he had significantly less residuals on dentures today as compared to yesterday's session. After approximately eating one cracker, patient appeared to be somewhat fatigued from chewing and told SLP he wished to stop for now. Patient stated that he had some swallowing difficulties prior to stroke after "cancer" and he couldn't tell if his swallow was significantly changed as  compared to now that he has had a stroke. Patient continues to benefit from skilled SLP intervention to maximize speech-language and cognitive functioning prior to discharge.   Pain Pain Assessment Pain Scale: 0-10 Pain Score: 0-No pain  Therapy/Group: Individual Therapy   Sonia Baller, MA, CCC-SLP Speech Therapy

## 2020-10-27 NOTE — Plan of Care (Signed)
  Problem: RH Dressing Goal: LTG Patient will perform upper body dressing (OT) Description: LTG Patient will perform upper body dressing with assist, with/without cues (OT). Flowsheets (Taken 10/27/2020 1603) LTG: Pt will perform upper body dressing with assistance level of: (Goal updated 2/2 pt progress) Supervision/Verbal cueing Note: Goal updated 2/2 pt progress

## 2020-10-28 ENCOUNTER — Inpatient Hospital Stay (HOSPITAL_COMMUNITY): Payer: PPO | Admitting: Occupational Therapy

## 2020-10-28 ENCOUNTER — Inpatient Hospital Stay (HOSPITAL_COMMUNITY): Payer: PPO | Admitting: Physical Therapy

## 2020-10-28 ENCOUNTER — Inpatient Hospital Stay (HOSPITAL_COMMUNITY): Payer: PPO

## 2020-10-28 DIAGNOSIS — E876 Hypokalemia: Secondary | ICD-10-CM

## 2020-10-28 LAB — BASIC METABOLIC PANEL
Anion gap: 12 (ref 5–15)
BUN: 24 mg/dL — ABNORMAL HIGH (ref 8–23)
CO2: 28 mmol/L (ref 22–32)
Calcium: 8.3 mg/dL — ABNORMAL LOW (ref 8.9–10.3)
Chloride: 100 mmol/L (ref 98–111)
Creatinine, Ser: 1.07 mg/dL (ref 0.61–1.24)
GFR, Estimated: >60 mL/min (ref >60)
Glucose, Bld: 104 mg/dL — ABNORMAL HIGH (ref 70–99)
Potassium: 3.1 mmol/L — ABNORMAL LOW (ref 3.5–5.1)
Sodium: 140 mmol/L (ref 135–145)

## 2020-10-28 LAB — GLUCOSE, CAPILLARY
Glucose-Capillary: 100 mg/dL — ABNORMAL HIGH (ref 70–99)
Glucose-Capillary: 86 mg/dL (ref 70–99)
Glucose-Capillary: 109 mg/dL — ABNORMAL HIGH (ref 70–99)
Glucose-Capillary: 104 mg/dL — ABNORMAL HIGH (ref 70–99)

## 2020-10-28 LAB — CBC
HCT: 30 % — ABNORMAL LOW (ref 39.0–52.0)
Hemoglobin: 9.2 g/dL — ABNORMAL LOW (ref 13.0–17.0)
MCH: 25 pg — ABNORMAL LOW (ref 26.0–34.0)
MCHC: 30.7 g/dL (ref 30.0–36.0)
MCV: 81.5 fL (ref 80.0–100.0)
Platelets: 225 10*3/uL (ref 150–400)
RBC: 3.68 MIL/uL — ABNORMAL LOW (ref 4.22–5.81)
RDW: 25 % — ABNORMAL HIGH (ref 11.5–15.5)
WBC: 5.4 10*3/uL (ref 4.0–10.5)
nRBC: 0 % (ref 0.0–0.2)

## 2020-10-28 MED ORDER — POTASSIUM CHLORIDE CRYS ER 20 MEQ PO TBCR
20.0000 meq | EXTENDED_RELEASE_TABLET | Freq: Two times a day (BID) | ORAL | Status: DC
  Administered 2020-10-28 – 2020-11-01 (×10): 20 meq via ORAL
  Filled 2020-10-28 (×10): qty 1

## 2020-10-28 NOTE — Progress Notes (Signed)
Fernando [name] PHYSICAL MEDICINE & REHABILITATION PROGRESS NOTE   Subjective/Complaints:  No problems reported by nursing.   ROS: Limited due to cognitive/behavioral    Objective:   No results found. Recent Labs    10/28/20 0445  WBC 5.4  HGB 9.2*  HCT 30.0*  PLT 225   Recent Labs    10/28/20 0445  NA 140  K 3.1*  CL 100  CO2 28  GLUCOSE 104*  BUN 24*  CREATININE 1.07  CALCIUM 8.3*    Intake/Output Summary (Last 24 hours) at 10/28/2020 1038 Last data filed at 10/28/2020 6195 Gross per 24 hour  Intake 377 ml  Output --  Net 377 ml     Pressure Injury 10/14/20 Buttocks Left Deep Tissue Pressure Injury - Purple or maroon localized area of discolored intact skin or blood-filled blister due to damage of underlying soft tissue from pressure and/or shear. Purple/red area small fluid filled  (Active)  10/14/20 1700  Location: Buttocks  Location Orientation: Left  Staging: Deep Tissue Pressure Injury - Purple or maroon localized area of discolored intact skin or blood-filled blister due to damage of underlying soft tissue from pressure and/or shear.  Wound Description (Comments): Purple/red area small fluid filled blister noted at center  Present on Admission: Yes    Physical Exam: Vital Signs Blood pressure (!) 164/55, pulse 60, temperature 98.4 F (36.9 C), temperature source Oral, resp. rate 19, height 5\' 5"  (1.651 m), weight 64.2 kg, SpO2 93 %.   Constitutional: No distress . Vital signs reviewed. HEENT: EOMI, oral membranes moist Neck: supple Cardiovascular: RRR without murmur. No JVD    Respiratory/Chest: CTA Bilaterally without wheezes or rales. Normal effort    GI/Abdomen: BS +, non-tender, non-distended Ext: no clubbing, cyanosis, or edema Psych: pleasant and cooperative Skin: No evidence of breakdown, no evidence of rash Neuro: Alert HOH Motor: Limited due to ability to follow commands-RUE: 5/5 proximal distal RLE: 4 -/5 proximal to distal LUE: 4 /5  proximal distal, appears unchanged LLE:  3/5 proximal distal   Assessment/Plan: 1. Functional deficits which require 3+ hours per day of interdisciplinary therapy in a comprehensive inpatient rehab setting.  Physiatrist is providing close team supervision and 24 hour management of active medical problems listed below.  Physiatrist and rehab team continue to assess barriers to discharge/monitor patient progress toward functional and medical goals  Care Tool:  Bathing  Bathing activity did not occur: Refused Body parts bathed by patient: Right arm, Left arm, Chest, Abdomen, Face   Body parts bathed by helper: Buttocks, Right lower leg, Left lower leg, Left upper leg, Right upper leg Body parts n/a: Front perineal area, Buttocks, Right upper leg, Left upper leg, Right lower leg, Left lower leg, Face (did not attempt LB bathing 2/2 time constraints)   Bathing assist Assist Level: Minimal Assistance - Patient > 75%     Upper Body Dressing/Undressing Upper body dressing   What is the patient wearing?: Pull over shirt    Upper body assist Assist Level: Minimal Assistance - Patient > 75%    Lower Body Dressing/Undressing Lower body dressing      What is the patient wearing?: Pants     Lower body assist Assist for lower body dressing: 2 Helpers     Toileting Toileting    Toileting assist Assist for toileting: Total Assistance - Patient < 25% (stedy)     Transfers Chair/bed transfer  Transfers assist     Chair/bed transfer assist level: Dependent - Patient 0% (stedy)  Locomotion Ambulation   Ambulation assist   Ambulation activity did not occur: Safety/medical concerns  Assist level: 2 helpers Assistive device: Walker-rolling Max distance: 3   Walk 10 feet activity   Assist  Walk 10 feet activity did not occur: Safety/medical concerns  Assist level: 2 helpers Assistive device: Walker-rolling   Walk 50 feet activity   Assist           Walk  150 feet activity   Assist Walk 150 feet activity did not occur: Safety/medical concerns         Walk 10 feet on uneven surface  activity   Assist Walk 10 feet on uneven surfaces activity did not occur: Safety/medical concerns         Wheelchair     Assist Will patient use wheelchair at discharge?: Yes Type of Wheelchair: Manual    Wheelchair assist level: Minimal Assistance - Patient > 75% Max wheelchair distance: 25    Wheelchair 50 feet with 2 turns activity    Assist        Assist Level: Moderate Assistance - Patient 50 - 74%   Wheelchair 150 feet activity     Assist      Assist Level: Moderate Assistance - Patient 50 - 74%    Medical Problem List and Plan: 1.Left side hemiparesis dysarthria/dysphagiasecondary to right MCA infarction due to right ICA high-grade stenosis status post angioplasty  Continue CIR PT, OT, SLP  ELOS 12/16  2. Antithrombotics: -DVT/anticoagulation:Per neurology stopping Xarelto  -antiplatelet therapy: Aspirin81 mg daily 3. Pain Management:Tylenol as needed.CLBP add Kpad, also with lidoderm patch   4. Mood:Provide emotional support -antipsychotic agents: N/A 5. Neuropsych: This patientis notcapable of making decisions on hisown behalf. 6. Skin/Wound Care:Routine skin checks 7. Fluids/Electrolytes/Nutrition:Potassium 3.1 12/4   -supplement 8. Atrial fibrillation/tachybradycardia syndrome/pacemaker. Per Neuro and cardiology will resume Xarelto and monitor Hgb  Continue Toprol25 mg daily  Rate controlled on 12/4 9. SCC of the carotid gland. Follow-up outpatient 10. Hypertension. Toprol 25 mg daily.   Cozaar 25 started on 11/27- increased to 50mg  on 12/2   Vitals:   10/28/20 0418 10/28/20 0901  BP: (!) 168/67 (!) 164/55  Pulse: 66 60  Resp: 19   Temp: 98.4 F (36.9 C)   SpO2: 93%   12/4 fair control  11. Hypothyroidism. Synthroid 12. Bacteremia.Completed  Ancef 2 g every 8 hours 10-daycourse 13.  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 Dysphagia # 1 thins liquids. May resume diet, drank H20 for me through a straw without cough or wet voice 15. CKD stage II/III. Baseline 1.30-1.48.   Creatinine 0.98 on 11/27 16. Acute blood loss anemia with history of GI bleed 2019. Patient transfused 1 unit packed red blood cells 10/12/2020. Continue iron supplement. Protonix 40 mg twice daily.   Hemoglobin 8.9 on 11/28 stable vs 11/22 ok to resume Xarelto as rec per Neuro and cardio Xarelto started 11/29 repeat hgb 9.2 12/4   17. Prediabetes. Hemoglobin A1c 5.7. SSI.   Slightly labile, but relatively controlled     18. Chronic  systolic congestive heart failure-compensated, cards has signed off   Lasix 20 daily per cardiology, appreciate recs Filed Weights   10/26/20 0521 10/27/20 0332 10/28/20 0417  Weight: 62.7 kg 63.5 kg 64.2 kg    12/4 slight upward trend---doesn't appear fluid overloaded although is +2500 during this admit    -if further increase in weight tomorrow, will give addnl dose of lasix   LOS: 14 days A FACE TO FACE EVALUATION  WAS PERFORMED  Meredith Staggers 10/28/2020, 10:38 AM

## 2020-10-29 ENCOUNTER — Inpatient Hospital Stay (HOSPITAL_COMMUNITY): Payer: PPO | Admitting: Occupational Therapy

## 2020-10-29 ENCOUNTER — Inpatient Hospital Stay (HOSPITAL_COMMUNITY): Payer: PPO | Admitting: Speech Pathology

## 2020-10-29 ENCOUNTER — Inpatient Hospital Stay (HOSPITAL_COMMUNITY): Payer: PPO | Admitting: Physical Therapy

## 2020-10-29 LAB — GLUCOSE, CAPILLARY
Glucose-Capillary: 93 mg/dL (ref 70–99)
Glucose-Capillary: 109 mg/dL — ABNORMAL HIGH (ref 70–99)
Glucose-Capillary: 102 mg/dL — ABNORMAL HIGH (ref 70–99)
Glucose-Capillary: 100 mg/dL — ABNORMAL HIGH (ref 70–99)

## 2020-10-29 NOTE — Progress Notes (Signed)
[facility] PHYSICAL MEDICINE & REHABILITATION PROGRESS NOTE   Subjective/Complaints:  Had a good night. Slept well. No pain  ROS: Limited due to cognitive/behavioral    Objective:   No results found. Recent Labs    10/28/20 0445  WBC 5.4  HGB 9.2*  HCT 30.0*  PLT 225   Recent Labs    10/28/20 0445  NA 140  K 3.1*  CL 100  CO2 28  GLUCOSE 104*  BUN 24*  CREATININE 1.07  CALCIUM 8.3*    Intake/Output Summary (Last 24 hours) at 10/29/2020 1034 Last data filed at 10/29/2020 0148 Gross per 24 hour  Intake 320 ml  Output --  Net 320 ml     Pressure Injury 10/14/20 Buttocks Left Deep Tissue Pressure Injury - Purple or maroon localized area of discolored intact skin or blood-filled blister due to damage of underlying soft tissue from pressure and/or shear. Purple/red area small fluid filled  (Active)  10/14/20 1700  Location: Buttocks  Location Orientation: Left  Staging: Deep Tissue Pressure Injury - Purple or maroon localized area of discolored intact skin or blood-filled blister due to damage of underlying soft tissue from pressure and/or shear.  Wound Description (Comments): Purple/red area small fluid filled blister noted at center  Present on Admission: Yes    Physical Exam: Vital Signs Blood pressure (!) 175/72, pulse 62, temperature 98 F (36.7 C), resp. rate 16, height 5\' 5"  (1.651 m), weight 63.7 kg, SpO2 99 %.   Constitutional: No distress . Vital signs reviewed. HEENT: EOMI, oral membranes moist Neck: supple Cardiovascular: RRR without murmur. No JVD    Respiratory/Chest: CTA Bilaterally without wheezes or rales. Normal effort    GI/Abdomen: BS +, non-tender, non-distended Ext: no clubbing, cyanosis, or edema Psych: pleasant and cooperative Skin: blister/breakdown left buttock Neuro: Alert HOH Motor: Limited due to ability to follow commands-RUE: 5/5 proximal distal RLE: 4 -/5 proximal to distal LUE: 4 /5 proximal distal, appears  unchanged LLE:  3/5 proximal distal   Assessment/Plan: 1. Functional deficits which require 3+ hours per day of interdisciplinary therapy in a comprehensive inpatient rehab setting.  Physiatrist is providing close team supervision and 24 hour management of active medical problems listed below.  Physiatrist and rehab team continue to assess barriers to discharge/monitor patient progress toward functional and medical goals  Care Tool:  Bathing  Bathing activity did not occur: Refused Body parts bathed by patient: Right arm, Left arm, Chest, Abdomen, Face   Body parts bathed by helper: Buttocks, Right lower leg, Left lower leg, Left upper leg, Right upper leg Body parts n/a: Front perineal area, Buttocks, Right upper leg, Left upper leg, Right lower leg, Left lower leg, Face (did not attempt LB bathing 2/2 time constraints)   Bathing assist Assist Level: Minimal Assistance - Patient > 75%     Upper Body Dressing/Undressing Upper body dressing   What is the patient wearing?: Pull over shirt    Upper body assist Assist Level: Minimal Assistance - Patient > 75%    Lower Body Dressing/Undressing Lower body dressing      What is the patient wearing?: Pants     Lower body assist Assist for lower body dressing: 2 Helpers     Toileting Toileting    Toileting assist Assist for toileting: Total Assistance - Patient < 25% (stedy)     Transfers Chair/bed transfer  Transfers assist     Chair/bed transfer assist level: Dependent - Patient 0% (stedy)     Locomotion Ambulation  Ambulation assist   Ambulation activity did not occur: Safety/medical concerns  Assist level: 2 helpers Assistive device: Walker-rolling Max distance: 3   Walk 10 feet activity   Assist  Walk 10 feet activity did not occur: Safety/medical concerns  Assist level: 2 helpers Assistive device: Walker-rolling   Walk 50 feet activity   Assist           Walk 150 feet  activity   Assist Walk 150 feet activity did not occur: Safety/medical concerns         Walk 10 feet on uneven surface  activity   Assist Walk 10 feet on uneven surfaces activity did not occur: Safety/medical concerns         Wheelchair     Assist Will patient use wheelchair at discharge?: Yes Type of Wheelchair: Manual    Wheelchair assist level: Minimal Assistance - Patient > 75% Max wheelchair distance: 25    Wheelchair 50 feet with 2 turns activity    Assist        Assist Level: Moderate Assistance - Patient 50 - 74%   Wheelchair 150 feet activity     Assist      Assist Level: Moderate Assistance - Patient 50 - 74%    Medical Problem List and Plan: 1.Left side hemiparesis dysarthria/dysphagiasecondary to right MCA infarction due to right ICA high-grade stenosis status post angioplasty  Continue CIR PT, OT, SLP  ELOS 12/16  2. Antithrombotics: -DVT/anticoagulation:Per neurology stopping Xarelto  -antiplatelet therapy: Aspirin81 mg daily 3. Pain Management:Tylenol as needed.  -CLBP add Kpad, also with lidoderm patch   4. Mood:Provide emotional support -antipsychotic agents: N/A 5. Neuropsych: This patientis notcapable of making decisions on hisown behalf. 6. Skin/Wound Care:Routine skin checks 7. Fluids/Electrolytes/Nutrition:Potassium 3.1 12/4   -supplement 8. Atrial fibrillation/tachybradycardia syndrome/pacemaker. Per Neuro and cardiology will resume Xarelto and monitor Hgb  Continue Toprol25 mg daily  Rate controlled on 12/5 9. SCC of the carotid gland. Follow-up outpatient 10. Hypertension. Toprol 25 mg daily.   Cozaar 25 started on 11/27- increased to 50mg  on 12/2   Vitals:   10/28/20 1944 10/29/20 0415  BP: (!) 161/62 (!) 175/72  Pulse: 63 62  Resp: 16 16  Temp: 97.6 F (36.4 C) 98 F (36.7 C)  SpO2: 100% 99%  12/5 fair control  11. Hypothyroidism. Synthroid 12.  Bacteremia.Completed Ancef 2 g every 8 hours 10-daycourse 13.  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 Dysphagia # 1 thins liquids. May resume diet, drank H20 for me through a straw without cough or wet voice 15. CKD stage II/III. Baseline 1.30-1.48.   Creatinine 0.98 on 11/27 16. Acute blood loss anemia with history of GI bleed 2019. Patient transfused 1 unit packed red blood cells 10/12/2020. Continue iron supplement. Protonix 40 mg twice daily.   Hemoglobin 8.9 on 11/28 stable vs 11/22 ok to resume Xarelto as rec per Neuro and cardio Xarelto started 11/29 repeat hgb 9.2 12/4   17. Prediabetes. Hemoglobin A1c 5.7. SSI.   Slightly labile, but relatively controlled     18. Chronic  systolic congestive heart failure-compensated, cards has signed off   Lasix 20 daily per cardiology, appreciate recs Filed Weights   10/27/20 0332 10/28/20 0417 10/29/20 0416  Weight: 63.5 kg 64.2 kg 63.7 kg    12/5 slight upward trend---doesn't appear fluid overloaded although is +3100 during this admit    -consider addnl lasix if weight increases further   LOS: 15 days A FACE TO [place]  Alen Blew 10/29/2020, 10:34 AM

## 2020-10-29 NOTE — Progress Notes (Signed)
Speech Language Pathology Daily Session Note  Patient Details  Name: Fernando [name] MRN: [id number] Date of Birth: 1927/08/17  Today's Date: 10/29/2020 SLP Individual Time: 1165-7903 SLP Individual Time Calculation (min): 43 min  Short Term Goals: Week 2: SLP Short Term Goal 1 (Week 2): Pt will use compensatory swallowing strategies with current diet with Min A verbal cues. SLP Short Term Goal 2 (Week 2): Pt will demonstrate efficient mastication, oral clearance, and no decline in respiratory status when cosuming trials of Dys 2 textures across at least 2 session prior to upgrade. SLP Short Term Goal 3 (Week 2): Pt will use compensatory strategies to increase speech intelligibility to 75% at the phrase level with Mod A multimodal cues. SLP Short Term Goal 4 (Week 2): Pt will recall 1 safety precaution with Mod A cues for use of strategies and/or aids. SLP Short Term Goal 5 (Week 2): Pt will identify 1 physical and 1 cognitive-linguistic deficit in setting of acute CVA with Mod A cues. SLP Short Term Goal 6 (Week 2): Pt will demonstrate ability to problem solve basic functional and familiar situations with Min A cues.  Skilled Therapeutic Interventions:  Pt was seen for skilled ST targeting speech intelligibility goals.  Pt was asleep upon arrival but awakened easily to voice and light touch.  Pt required max assist to slow rate of speech and increase vocal intensity to achieve intelligibility at the word-short phrase level.  Pt's son arrived shortly thereafter and stated that pt's speech has been "close to normal" when he's wearing his dentures.  Dentures were placed and pt's speech did improve slightly to where he only needed mod assist verbal cues to achieve intelligibility at the word-short phrase level; however, pt's son states that pt's speech was not quite returned to where it has been lately.  Suspect fatigue was impacting speech during today's session as pt had just woken from a nap but  instructed pt's son to inform nursing should pt exhibit any other notable changes in function.  Pt was left in bed with son at bedside.  Continue per current plan of care.    Pain Pain Assessment Pain Scale: 0-10 Pain Score: 0-No pain   Therapy/Group: Individual Therapy  Kristoff Coonradt, Selinda Orion 10/29/2020, 12:52 PM

## 2020-10-29 NOTE — Progress Notes (Signed)
Physical Therapy Session Note  Patient Details  Name: Fernando [name] MRN: [id number] Date of Birth: [birth year]  Today's Date: 10/29/2020 PT Individual Time: 0700-0745 PT Individual Time Calculation (min): 45 min   Short Term Goals: Week 1:  PT Short Term Goal 1 (Week 1): Pt will be able to perform bed mobility with mod assist overall PT Short Term Goal 1 - Progress (Week 1): Met PT Short Term Goal 2 (Week 1): Pt will be able to perform bed <> chair transfer with mod assist PT Short Term Goal 2 - Progress (Week 1): Progressing toward goal PT Short Term Goal 3 (Week 1): Pt will be able to perform sit <> stands with mod +2 PT Short Term Goal 3 - Progress (Week 1): Met Week 2:  PT Short Term Goal 1 (Week 2): Pt will be able to perform bed <> chair transfer with mod assist PT Short Term Goal 2 (Week 2): Patient will complete sit <> stand with MinA and LRAD PT Short Term Goal 3 (Week 2): Patient will ambulate >45f with MinA, wc follow if needed, LRAD  Skilled Therapeutic Interventions/Progress Updates:    pt received in bed and agreeable to therapy. Pt noticeable heavy breathing even during sleep when PT entered room. O2 monitored to be 92% HR 64. Pt reported he needed to use restroom, nursing present for medication and agreeable to assist, pt directed in supine>sit mod A for posterior/ Lt lean. Stedy used for transfer to commode with x2 person assist. Pt then directed into sitting on BSC over toilet. Pt directed in x2 Sit to stand to stedy from commode as pt unable to achieve fully standing with use of stedy for complete hygiene. Total A for hygiene and donning and doffing brief. Pt returned to bedside with stedy and x2 helpers for safety with min A for posture and safety while in stedy with trunk lean to L. Pt then able to come to standing from stedy seat at min A and sat EOB, required to return to supine for correcting of brief placement. Pt directed in sit>supine max A and rolling Rt and Lt min A  for correcting brief. Nursing left room and pt agreeable to continuing therapy post rest break. Pt directed in supine>sit min A with extra time and single step cues, majority of assistance at trunk. Pt directed in dynamic sitting balance with trunk intermittently unsupported, initially pt mod A for sitting balance but with hip wedge improved to CGA. Pt directed in BLE and BUE ipsilateral hip flexion/shoulder flexion for dynamic sitting balance and coordinated mobility, reciprocal mobility much too difficult for pt and ipsilateral more achievable this AM. Pt able to complete x8 each side with fair technique then pt required rest break. Pt then directed in lateral trunk leans to Rt side at bedside 2x8 with 10 sec holds at lean on elbow for increased weight shifting to Rt side and decrease Lt side trunk lean. Pt directed in x3 Sit to stand from bedside with Rolling walker with gait belt use mod A first two reps and max A last rep with decreased trunk control noted and decreased ability to maintain standing position compared to first two stands. Pt then directed in sit>supine max A. Once in supine, pt directed in x5 bridges with PNF technique for muscle feedback at anterior hip to improve activation for increasing lift from bed during bridge with good effect. Pt also mod A for upright scooting in bed for positioning. Pt left in bed, alarm set, All  needs in reach and in good condition. Call light in hand.     Therapy Documentation Precautions:  Precautions Precautions: ICD/Pacemaker, Fall Precaution Comments: L sided weakness; very HOH; dysarthria Restrictions Weight Bearing Restrictions: No General:   Vital Signs:  Pain:   Mobility:   Locomotion :    Trunk/Postural Assessment :    Balance:   Exercises:   Other Treatments:      Therapy/Group: Individual Therapy  Junie Panning 10/29/2020, 8:35 AM

## 2020-10-29 NOTE — Progress Notes (Signed)
Occupational Therapy Session Note  Patient Details  Name: Fernando [name] MRN: [id number] Date of Birth: 1927-10-14  Today's Date: 10/29/2020 OT Individual Time: 1345-1430 OT Individual Time Calculation (min): 45 min   Skilled Therapeutic Interventions/Progress Updates:    Pt greeted in the bed with no c/o pain. Used his hearing amplifier to communicate initially due to [facility]. His son and caregiver, Fong, was present and interested in beginning hands on family training, stated that pts brief was soiled. Per son, pt will have a type of adjustable bed at home (in terms of height of bed itself and HOB). Discussed importance of elevating height of bed for optimal caregiver body mechanics. Maejor had hands on training with assisting pt with hygiene and brief change post B+B incontinence. OT provided verbal instruction regarding rolling technique, overall guidance, and +2 assist for mobility. We talked about the importance of sidelying position and changing positions in bed for wound healing/skin integrity in general. Placed pt in sidelying position in bed using pillows after we boosted him up in bed. Education initiated regarding timed toileting at home as well. Pts son was very appreciative and asked appropriate questions. Pt remained in sidelying position at end of tx, left with all needs within reach and bed alarm set.   Therapy Documentation Precautions:  Precautions Precautions: ICD/Pacemaker, Fall Precaution Comments: L sided weakness; very HOH; dysarthria Restrictions Weight Bearing Restrictions: No Vital Signs: Therapy Vitals Temp: 98.1 F (36.7 C) Pulse Rate: 63 Resp: 18 BP: (!) 150/50 Patient Position (if appropriate): Lying Oxygen Therapy SpO2: 92 % O2 Device: Room Air ADL: ADL Eating: Maximal assistance Where Assessed-Eating: Bed level Grooming: Maximal assistance Where Assessed-Grooming: Bed level Upper Body Bathing: Not assessed Lower Body Bathing: Not assessed Upper  Body Dressing: Maximal assistance Where Assessed-Upper Body Dressing: Edge of bed Lower Body Dressing: Dependent Where Assessed-Lower Body Dressing: Edge of bed Toileting: Unable to assess Toilet Transfer: Unable to assess Toilet Transfer Method: Unable to assess Tub/Shower Transfer: Unable to assess      Therapy/Group: Individual Therapy  Anni Hocevar A Collyn Ribas 10/29/2020, 4:07 PM

## 2020-10-30 ENCOUNTER — Inpatient Hospital Stay (HOSPITAL_COMMUNITY): Payer: PPO

## 2020-10-30 ENCOUNTER — Inpatient Hospital Stay (HOSPITAL_COMMUNITY): Payer: PPO | Admitting: Occupational Therapy

## 2020-10-30 LAB — GLUCOSE, CAPILLARY
Glucose-Capillary: 97 mg/dL (ref 70–99)
Glucose-Capillary: 147 mg/dL — ABNORMAL HIGH (ref 70–99)
Glucose-Capillary: 103 mg/dL — ABNORMAL HIGH (ref 70–99)
Glucose-Capillary: 104 mg/dL — ABNORMAL HIGH (ref 70–99)

## 2020-10-30 NOTE — Progress Notes (Signed)
Physical Therapy Weekly Progress Note  Patient Details  Name: Malick Netz MRN: [id number] Date of Birth: [birth year]  Beginning of progress report period: October 23, 2020 End of progress report period: October 30, 2020   Patient has met 2 of 3 short term goals.  Patient is making steady progress toward his goals. He remains limited by poor endurance and intermittent dyspnea with exertion/activity. He continues to require MaxA or use of Stedy for transfers due to fatigue and poor motor planning of transfer. He has progressed his ability to ambulate short household distances with walker and grossly MinA, though at times he may require up to [facility] due to increasing fatigue levels. Patient will continue to benefit from skilled PT services in order to progress activity tolerance/endurance, gait tx and improve levels of independence with transfers.    Patient continues to demonstrate the following deficits muscle weakness, decreased cardiorespiratoy endurance, impaired timing and sequencing, unbalanced muscle activation, decreased coordination and decreased motor planning, decreased initiation, decreased attention, decreased awareness, decreased problem solving, decreased safety awareness, decreased memory and delayed processing and decreased sitting balance, decreased standing balance, decreased postural control, hemiplegia and decreased balance strategies and therefore will continue to benefit from skilled PT intervention to increase functional independence with mobility.  Patient progressing toward long term goals..  Continue plan of care.  PT Short Term Goals Week 2:  PT Short Term Goal 1 (Week 2): Pt will be able to perform bed <> chair transfer with mod assist PT Short Term Goal 1 - Progress (Week 2): Progressing toward goal PT Short Term Goal 2 (Week 2): Patient will complete sit <> stand with MinA and LRAD PT Short Term Goal 2 - Progress (Week 2): Met PT Short Term Goal 3 (Week 2): Patient  will ambulate >24f with MinA, wc follow if needed, LRAD PT Short Term Goal 3 - Progress (Week 2): Met Week 3:  PT Short Term Goal 1 (Week 3): Pt will be able to perform bed <> chair transfer with mod assist PT Short Term Goal 2 (Week 3): Patient will complete sit <> stand transfers with CGA and LRAD PT Short Term Goal 3 (Week 3): Patient will ambulate 340fwith LRAD, wc follow if needed and MinA  Therapy Documentation Precautions:  Precautions Precautions: ICD/Pacemaker, Fall Precaution Comments: L sided weakness; very HOH; dysarthria Restrictions Weight Bearing Restrictions: No    JeDebbora Dus2/04/2020, 4:05 PM

## 2020-10-30 NOTE — Progress Notes (Signed)
Speech Language Pathology Daily Session Note  Patient Details  Name: Fernando [name] MRN: [id number] Date of Birth: 18-Dec-1926  Today's Date: 10/30/2020 SLP Individual Time: 1232-1300 SLP Individual Time Calculation (min): 28 min  Short Term Goals: Week 2: SLP Short Term Goal 1 (Week 2): Pt will use compensatory swallowing strategies with current diet with Min A verbal cues. SLP Short Term Goal 2 (Week 2): Pt will demonstrate efficient mastication, oral clearance, and no decline in respiratory status when cosuming trials of Dys 2 textures across at least 2 session prior to upgrade. SLP Short Term Goal 3 (Week 2): Pt will use compensatory strategies to increase speech intelligibility to 75% at the phrase level with Mod A multimodal cues. SLP Short Term Goal 4 (Week 2): Pt will recall 1 safety precaution with Mod A cues for use of strategies and/or aids. SLP Short Term Goal 5 (Week 2): Pt will identify 1 physical and 1 cognitive-linguistic deficit in setting of acute CVA with Mod A cues. SLP Short Term Goal 6 (Week 2): Pt will demonstrate ability to problem solve basic functional and familiar situations with Min A cues.  Skilled Therapeutic Interventions: Skilled ST services focused on swallow skills. SLP planned dys 2 texture lunch tray with pt and informed nursing to save tray. Pt had consumed meal when SLP entered. The supervising NT reported appropriate oral clearance no overt coughing and assisted with self-feeding due to upper extremity coordination/weakness. Pt consumed dys 2 texture snack ( SLP feed pt and provided alternating liquids), demonstrating appropriate oral clearance and intermittent throat clear noted throughout session with and without PO intake. Pt expressed preference of chopped food versus puree. SLP upgraded pt to dys 2 textures full supervision with dentures in place, due to tolerance of x3 snack trials with ST and reports of tolerance with NT. SLP recommends to continue  monitoring tolerance of upgraded solids, preferential over a meal to better assess fatigue. Pt was left with chair alarm set and call bell within reach. Recommend to continue skilled ST services.     Pain Pain Assessment Pain Scale: 0-10 Pain Score: 0-No pain Pain Type: Chronic pain Pain Location: Back Pain Orientation: Lower Pain Descriptors / Indicators: Aching Pain Frequency: Constant Pain Onset: On-going Patients Stated Pain Goal: 2 Pain Intervention(s): Medication (See eMAR)  Therapy/Group: Individual Therapy  Felishia Wartman  [facility] 10/30/2020, 12:50 PM

## 2020-10-30 NOTE — Progress Notes (Signed)
Occupational Therapy Session Note  Patient Details  Name: Joah Patlan MRN: [id number] Date of Birth: [birth year]  Today's Date: 10/30/2020 OT Individual Time: 0905-1000 OT Individual Time Calculation (min): 55 min    Short Term Goals: Week 3:  OT Short Term Goal 1 (Week 3): Pt will complete sit to stand for progressing of LB selfcare with max assist. OT Short Term Goal 2 (Week 3): Pt will perform squat pivot transfer to the drop arm commode with max assist. OT Short Term Goal 3 (Week 3): Pt will thread 1 lower extremity when donning pants with mod A. OT Short Term Goal 4 (Week 3): Pt will don shirt with supervision.  Skilled Therapeutic Interventions/Progress Updates:    Pt received semi-reclined in bed, agreeable to therapy.  Functional Mobility/Transfers: Side roll to R with mod A to place L hand on bed rail + to bend BLE. Sup > sit EOB with mod A to lift trunk and move BLE off bed. Dep for sit<>stand, toilet, and TIS w/c transfers in stedy 2/2 time constraints + energy conservation.   Dressing: Donned voice amplifier headphones with set-up A. Doffed long sleeved shirt with supervision. Donned long-sleeved shirt with mod A to orient shirt, thread LUE, and pull shirt down over trunk. Dep to don brief + pants for sit<>stand to pull up over hips. Pt able to initiate threading of BLE but req A to get feet into holes. Donned TEDs + bilat gripper socks with total A.  Bathing: Seated in TIS w/c pt, bathed UB with close supervision. Dep to bathe buttocks in stedy with sit<>stand. Pt able to bath tops of BLE, shins, and tops of feet. Req min VC for thoroughness of overall LB bathing.   Toileting: Transported pt to bathroom per timed toileting schedule in stedy. Pt voided bladder + had small BM. Dep for posterior pericare + clothing management for sit<>stand in stedy. Assists minimally with LUE in pulling up brief + pants.  Vitals: Cont to demonstrate labored breathing with activity, O2sat at  93% after func mobility, HR: mid 70s bpm.  Pt demonstrates improved ability to func use BUE but cont to be limited by limited activity tolerance + limited functional strength for transfers and will cont to benefit from skilled CIR OT to maximize functional mobility, ADL/IADL performance, and to reduce caregiver burden.   Pt left seated in TIS with safety belt in place, adapted call bell in reach, and all immediate needs met.  Therapy Documentation Precautions:  Precautions Precautions: ICD/Pacemaker, Fall Precaution Comments: L sided weakness; very HOH; dysarthria Restrictions Weight Bearing Restrictions: No  Pain: no c/o pain  ADL:See Care Tool for more details.  Therapy/Group: Individual Therapy  Volanda Napoleon MS, OTR/L Clyda Greener OTR/L  10/30/2020, 12:35 PM

## 2020-10-30 NOTE — Progress Notes (Signed)
[place] PHYSICAL MEDICINE & REHABILITATION PROGRESS NOTE   Subjective/Complaints:  No issues overnite, breathing ok, back pain relieved by heating pad and Lidoderm patch   ROS: Limited due to cognitive/behavioral    Objective:   No results found. Recent Labs    10/28/20 0445  WBC 5.4  HGB 9.2*  HCT 30.0*  PLT 225   Recent Labs    10/28/20 0445  NA 140  K 3.1*  CL 100  CO2 28  GLUCOSE 104*  BUN 24*  CREATININE 1.07  CALCIUM 8.3*    Intake/Output Summary (Last 24 hours) at 10/30/2020 0836 Last data filed at 10/30/2020 0759 Gross per 24 hour  Intake 905 ml  Output --  Net 905 ml     Pressure Injury 10/14/20 Buttocks Left Deep Tissue Pressure Injury - Purple or maroon localized area of discolored intact skin or blood-filled blister due to damage of underlying soft tissue from pressure and/or shear. Purple/red area small fluid filled  (Active)  10/14/20 1700  Location: Buttocks  Location Orientation: Left  Staging: Deep Tissue Pressure Injury - Purple or maroon localized area of discolored intact skin or blood-filled blister due to damage of underlying soft tissue from pressure and/or shear.  Wound Description (Comments): Purple/red area small fluid filled blister noted at center  Present on Admission: Yes    Physical Exam: Vital Signs Blood pressure (!) 145/54, pulse 65, temperature 97.6 F (36.4 C), temperature source Oral, resp. rate 20, height 5\' 5"  (1.651 m), weight 60.8 kg, SpO2 99 %.   General: No acute distress Mood and affect are appropriate Heart: Regular rate and rhythm no rubs murmurs or extra sounds Lungs: Clear to auscultation, breathing unlabored, no rales or wheezes Abdomen: Positive bowel sounds, soft nontender to palpation, nondistended Extremities: No clubbing, cyanosis, or edema Skin: No evidence of breakdown, no evidence of rash  HOH Motor: Limited due to ability to follow commands-RUE: 5/5 proximal distal RLE: 4 -/5 proximal to  distal LUE: 4 /5 proximal distal, appears unchanged LLE:  3/5 proximal distal   Assessment/Plan: 1. Functional deficits which require 3+ hours per day of interdisciplinary therapy in a comprehensive inpatient rehab setting.  Physiatrist is providing close team supervision and 24 hour management of active medical problems listed below.  Physiatrist and rehab team continue to assess barriers to discharge/monitor patient progress toward functional and medical goals  Care Tool:  Bathing  Bathing activity did not occur: Refused Body parts bathed by patient: Right arm, Left arm, Chest, Abdomen, Face   Body parts bathed by helper: Buttocks, Right lower leg, Left lower leg, Left upper leg, Right upper leg Body parts n/a: Front perineal area, Buttocks, Right upper leg, Left upper leg, Right lower leg, Left lower leg, Face (did not attempt LB bathing 2/2 time constraints)   Bathing assist Assist Level: Minimal Assistance - Patient > 75%     Upper Body Dressing/Undressing Upper body dressing   What is the patient wearing?: Pull over shirt    Upper body assist Assist Level: Minimal Assistance - Patient > 75%    Lower Body Dressing/Undressing Lower body dressing      What is the patient wearing?: Pants     Lower body assist Assist for lower body dressing: 2 Helpers     Toileting Toileting    Toileting assist Assist for toileting: Total Assistance - Patient < 25% (stedy)     Transfers Chair/bed transfer  Transfers assist     Chair/bed transfer assist level: Dependent -  Patient 0% (stedy)     Locomotion Ambulation   Ambulation assist   Ambulation activity did not occur: Safety/medical concerns  Assist level: 2 helpers Assistive device: Walker-rolling Max distance: 3   Walk 10 feet activity   Assist  Walk 10 feet activity did not occur: Safety/medical concerns  Assist level: 2 helpers Assistive device: Walker-rolling   Walk 50 feet activity   Assist            Walk 150 feet activity   Assist Walk 150 feet activity did not occur: Safety/medical concerns         Walk 10 feet on uneven surface  activity   Assist Walk 10 feet on uneven surfaces activity did not occur: Safety/medical concerns         Wheelchair     Assist Will patient use wheelchair at discharge?: Yes Type of Wheelchair: Manual    Wheelchair assist level: Minimal Assistance - Patient > 75% Max wheelchair distance: 25    Wheelchair 50 feet with 2 turns activity    Assist        Assist Level: Moderate Assistance - Patient 50 - 74%   Wheelchair 150 feet activity     Assist      Assist Level: Moderate Assistance - Patient 50 - 74%    Medical Problem List and Plan: 1.Left side hemiparesis dysarthria/dysphagiasecondary to right MCA infarction due to right ICA high-grade stenosis status post angioplasty  Continue CIR PT, OT, SLP  ELOS 12/16  2. Antithrombotics: -DVT/anticoagulation:Per neurology stopping Xarelto  -antiplatelet therapy: Aspirin81 mg daily 3. Pain Management:Tylenol as needed.  -CLBP add Kpad, also with lidoderm patch   4. Mood:Provide emotional support -antipsychotic agents: N/A 5. Neuropsych: This patientis notcapable of making decisions on hisown behalf. 6. Skin/Wound Care:Routine skin checks 7. Fluids/Electrolytes/Nutrition:Potassium 3.1 12/4   -supplement 8. Atrial fibrillation/tachybradycardia syndrome/pacemaker. Per Neuro and cardiology will resume Xarelto and monitor Hgb  Continue Toprol25 mg daily  Rate controlled on 12/5 9. SCC of the carotid gland. Follow-up outpatient 10. Hypertension. Toprol 25 mg daily.   Cozaar 25 started on 11/27- increased to 50mg  on 12/2   Vitals:   10/30/20 0445 10/30/20 0814  BP: (!) 174/59 (!) 145/54  Pulse: 63 65  Resp: 20   Temp: 97.6 F (36.4 C)   SpO2: 99%   12/6 elevated prior to meds this am , monitor   11.  Hypothyroidism. Synthroid 12. Bacteremia.Completed Ancef 2 g every 8 hours 10-daycourse 13.  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 Dysphagia # 1 thins liquids. May resume diet, drank H20 for me through a straw without cough or wet voice 15. CKD stage II/III. Baseline 1.30-1.48.   Creatinine 0.98 on 11/27 16. Acute blood loss anemia with history of GI bleed 2019. Patient transfused 1 unit packed red blood cells 10/12/2020. Continue iron supplement. Protonix 40 mg twice daily.   Hemoglobin 8.9 on 11/28 stable vs 11/22 ok to resume Xarelto as rec per Neuro and cardio Xarelto started 11/29 repeat hgb 9.2 12/4   17. Prediabetes. Hemoglobin A1c 5.7. SSI.   Slightly labile, but relatively controlled     18. Chronic  systolic congestive heart failure-compensated, cards has signed off   Lasix 20 daily per cardiology, appreciate recs Filed Weights   10/28/20 0417 10/29/20 0416 10/30/20 0444  Weight: 64.2 kg 63.7 kg 60.8 kg    ? Technique no diuresis but 3kg wt loss recored in 24h   19,  Hypo K+ now on KCL 41meq BID  LOS: 16 days A FACE TO FACE EVALUATION WAS PERFORMED  Charlett Blake 10/30/2020, 8:36 AM

## 2020-10-30 NOTE — Progress Notes (Signed)
Physical Therapy Session Note  Patient Details  Name: Fernando [name] MRN: [id number] Date of Birth: 1927-08-14  Today's Date: 10/30/2020 PT Individual Time: 1100-1155 PT Individual Time Calculation (min): 55 min   Short Term Goals: Week 2:  PT Short Term Goal 1 (Week 2): Pt will be able to perform bed <> chair transfer with mod assist PT Short Term Goal 2 (Week 2): Patient will complete sit <> stand with MinA and LRAD PT Short Term Goal 3 (Week 2): Patient will ambulate >81ft with MinA, wc follow if needed, LRAD  Skilled Therapeutic Interventions/Progress Updates:    Patient received sitting up in wc, agreeable to PT. He denies pain and states that he's "having a good day" because he's "not in the grave." PT propelling patient in wc to therapy gym for time management and energy conservation. He was able to complete sit <> stand using RW and MinA. Patient completing Peg board puzzle to improve dynamic standing balance as well encourage longer bouts of standing to improve endurance. Patient maintains WBOS in stance as well as forward flexed posture, despite verbal and tactile cues for upright posture. Patient able to remain standing for ~3 mins at a time, but does require extended seated rest breaks afterwards. Patient ambulating 68ft, 72ft, 24ft with RW and MinA/ModA. With fatigue, he requires increased level of assistance. 2nd person for wc follow for safety. PT providing assist to maintain RW at appropriate distance from patient. Due to his forward flexed posture, RW easily rolls too far in front of him decreasing his stability and safety of gait mechanics. Decreased L foot clearance noted, WBOS, forward flexed posture, decreased B knee flexion thru swing phase. Patient returning to room in TIS wc, reclined, seatbelt alarm on, call light within reach, B UE supported on pillows.   Therapy Documentation Precautions:  Precautions Precautions: ICD/Pacemaker, Fall Precaution Comments: L sided  weakness; very HOH; dysarthria Restrictions Weight Bearing Restrictions: No    Therapy/Group: Individual Therapy  [name], PT, DPT, CBIS  10/30/2020, 7:40 AM

## 2020-10-31 ENCOUNTER — Inpatient Hospital Stay (HOSPITAL_COMMUNITY): Payer: PPO

## 2020-10-31 ENCOUNTER — Inpatient Hospital Stay (HOSPITAL_COMMUNITY): Payer: PPO | Admitting: Speech Pathology

## 2020-10-31 ENCOUNTER — Inpatient Hospital Stay (HOSPITAL_COMMUNITY): Payer: PPO | Admitting: Occupational Therapy

## 2020-10-31 LAB — GLUCOSE, CAPILLARY
Glucose-Capillary: 92 mg/dL (ref 70–99)
Glucose-Capillary: 100 mg/dL — ABNORMAL HIGH (ref 70–99)
Glucose-Capillary: 137 mg/dL — ABNORMAL HIGH (ref 70–99)
Glucose-Capillary: 110 mg/dL — ABNORMAL HIGH (ref 70–99)

## 2020-10-31 MED ORDER — CLONIDINE HCL 0.1 MG PO TABS
0.1000 mg | ORAL_TABLET | Freq: Two times a day (BID) | ORAL | Status: DC
  Administered 2020-10-31 – 2020-11-01 (×3): 0.1 mg via ORAL
  Filled 2020-10-31 (×3): qty 1

## 2020-10-31 NOTE — Progress Notes (Signed)
Speech Language Pathology Weekly Progress and Session Note  Patient Details  Name: Fernando [name] MRN: [id number] Date of Birth: 1927/07/26  Beginning of progress report period: October 24, 2020 End of progress report period: October 31, 2020  Today's Date: 10/31/2020 SLP Individual Time: 1400-1456 SLP Individual Time Calculation (min): 56 min  Short Term Goals: Week 2: SLP Short Term Goal 1 (Week 2): Pt will use compensatory swallowing strategies with current diet with Min A verbal cues. SLP Short Term Goal 1 - Progress (Week 2): Met SLP Short Term Goal 2 (Week 2): Pt will demonstrate efficient mastication, oral clearance, and no decline in respiratory status when cosuming trials of Dys 2 textures across at least 2 session prior to upgrade. SLP Short Term Goal 2 - Progress (Week 2): Met SLP Short Term Goal 3 (Week 2): Pt will use compensatory strategies to increase speech intelligibility to 75% at the phrase level with Mod A multimodal cues. SLP Short Term Goal 3 - Progress (Week 2): Met SLP Short Term Goal 4 (Week 2): Pt will recall 1 safety precaution with Mod A cues for use of strategies and/or aids. SLP Short Term Goal 4 - Progress (Week 2): Met SLP Short Term Goal 5 (Week 2): Pt will identify 1 physical and 1 cognitive-linguistic deficit in setting of acute CVA with Mod A cues. SLP Short Term Goal 5 - Progress (Week 2): Met SLP Short Term Goal 6 (Week 2): Pt will demonstrate ability to problem solve basic functional and familiar situations with Min A cues. SLP Short Term Goal 6 - Progress (Week 2): Progressing toward goal    New Short Term Goals: Week 3: SLP Short Term Goal 1 (Week 3): STG=LTG due to remaining length of stay  Weekly Progress Updates: Pt has made functional gains and met 5 out of 6 short term goals. Pt is currently Min-Mod assist for basic functional familiar due to cognitive impairments impacting his short term memory, intellectual awareness, and problem solving  abilities. He also has mild-mod dysarthria impacting his speech intelligibility due to abnormal respiratory pattern, strained/strangled vocal quality, and mild articulatory imprecision, but he is using strategies to compensate and increase intelligibility with Min-Mod cueing and is near baseline for speech (per son's report). Pt is consuming an upgraded dysphagia 2 (minced/ground) diet with thin liquids and using compensatory swallow strategies with Min A verbal cues. Pt has demonstrated improved speech intelligibility, problem solving, awareness, and oral phase swallow function over the last week. Pt and family education is ongoing. Pt would continue to benefit from skilled ST while inpatient in order to maximize functional independence and reduce burden of care prior to discharge. Anticipate that pt will need 24/7 supervision at discharge in addition to [place] follow up at next level of care.     Intensity: Minumum of 1-2 x/day, 30 to 90 minutes Frequency: 3 to 5 out of 7 days Duration/Length of Stay: 11/09/20 Treatment/Interventions: Cognitive remediation/compensation;Cueing hierarchy;Dysphagia/aspiration precaution training;Functional tasks;Patient/family education;Therapeutic Activities;Internal/external aids;Speech/Language facilitation   Daily Session  Skilled Therapeutic Interventions: Pt was seen for skilled ST targeting dysphagia and cognitive goals. Voice amplifier donned throughout session for HOH. SLP facilitated session with introduction of memory notebook as compensatory memory strategy to aid in better recall and carryover of new information/therapy techniques. He recalled 1 PT and OT activity from today's session with Moderate verbal cues. He also completed a basic 3-step action card sequencing task with Mod faded to [place] A verbal and visual cues for problem solving and comprehension of directions.  SLP also provided skilled observation of pt consuming recently upgraded diet dys 2  (minced/ground) texture snack with thin liquids. Although intake was limited due to lack of appetite, his mastication was efficient and oral clearance complete. Min A verbal cues required for use of compensatory safe swallowing strategies during intake. Intermittent throat clearing noted, as is consistent for pt. Recommend continue current diet. Pt left laying in bed with alarm set and needs within reach. Continue per current plan of care.     Pain Pain Assessment Pain Scale: Faces Faces Pain Scale: Hurts a little bit Pain Type: Chronic pain Pain Location: Back Pain Descriptors / Indicators: Aching;Discomfort Pain Onset: Gradual Pain Intervention(s): RN made aware;Repositioned Multiple Pain Sites: No  Therapy/Group: Individual Therapy  [name] 10/31/2020, 3:01 PM

## 2020-10-31 NOTE — Progress Notes (Signed)
[place] PHYSICAL MEDICINE & REHABILITATION PROGRESS NOTE   Subjective/Complaints:  Had incont BM, nsg states pt did not alert staff  ROS: Limited due to cognitive/behavioral    Objective:   No results found. No results for input(s): WBC, HGB, HCT, PLT in the last 72 hours. No results for input(s): NA, K, CL, CO2, GLUCOSE, BUN, CREATININE, CALCIUM in the last 72 hours.  Intake/Output Summary (Last 24 hours) at 10/31/2020 0802 Last data filed at 10/31/2020 0748 Gross per 24 hour  Intake 549 ml  Output --  Net 549 ml     Pressure Injury 10/14/20 Buttocks Left Deep Tissue Pressure Injury - Purple or maroon localized area of discolored intact skin or blood-filled blister due to damage of underlying soft tissue from pressure and/or shear. Purple/red area small fluid filled  (Active)  10/14/20 1700  Location: Buttocks  Location Orientation: Left  Staging: Deep Tissue Pressure Injury - Purple or maroon localized area of discolored intact skin or blood-filled blister due to damage of underlying soft tissue from pressure and/or shear.  Wound Description (Comments): Purple/red area small fluid filled blister noted at center  Present on Admission: Yes    Physical Exam: Vital Signs Blood pressure (!) 165/66, pulse 66, temperature 97.8 F (36.6 C), temperature source Oral, resp. rate 18, height 5\' 5"  (1.651 m), weight 60.9 kg, SpO2 96 %.   General: No acute distress Mood and affect are appropriate Heart: Regular rate and rhythm no rubs murmurs or extra sounds Lungs: Clear to auscultation, breathing unlabored, no rales or wheezes Abdomen: Positive bowel sounds, soft nontender to palpation, nondistended Extremities: No clubbing, cyanosis, or edema Skin: buttocks no breakdown   HOH Motor: Limited due to ability to follow commands-RUE: 5/5 proximal distal RLE: 4 -/5 proximal to distal LUE: 4 /5 proximal distal, appears unchanged LLE:  3/5 proximal distal   Assessment/Plan: 1.  Functional deficits which require 3+ hours per day of interdisciplinary therapy in a comprehensive inpatient rehab setting.  Physiatrist is providing close team supervision and 24 hour management of active medical problems listed below.  Physiatrist and rehab team continue to assess barriers to discharge/monitor patient progress toward functional and medical goals  Care Tool:  Bathing  Bathing activity did not occur: Refused Body parts bathed by patient: Face, Right arm, Left arm, Chest, Abdomen, Front perineal area, Right upper leg, Left upper leg   Body parts bathed by helper: Buttocks, Right lower leg, Left lower leg Body parts n/a: Front perineal area, Buttocks, Right upper leg, Left upper leg, Right lower leg, Left lower leg, Face (did not attempt LB bathing 2/2 time constraints)   Bathing assist Assist Level: Maximal Assistance - Patient 24 - 49% (dep in stedy for sit<>stand to clean buttocks)     Upper Body Dressing/Undressing Upper body dressing   What is the patient wearing?: Pull over shirt    Upper body assist Assist Level: Moderate Assistance - Patient 50 - 74%    Lower Body Dressing/Undressing Lower body dressing      What is the patient wearing?: Incontinence brief, Pants     Lower body assist Assist for lower body dressing: Dependent - Patient 0% (in stedy for sit<>stand)     Toileting Toileting    Toileting assist Assist for toileting: Dependent - Patient 0% (stedy for sit<>stand)     Transfers Chair/bed transfer  Transfers assist     Chair/bed transfer assist level: Dependent - Patient 0% (stedy)     Locomotion Ambulation   Ambulation  assist   Ambulation activity did not occur: Safety/medical concerns  Assist level: 2 helpers Assistive device: Walker-rolling Max distance: 20   Walk 10 feet activity   Assist  Walk 10 feet activity did not occur: Safety/medical concerns  Assist level: 2 helpers Assistive device: Walker-rolling    Walk 50 feet activity   Assist           Walk 150 feet activity   Assist Walk 150 feet activity did not occur: Safety/medical concerns         Walk 10 feet on uneven surface  activity   Assist Walk 10 feet on uneven surfaces activity did not occur: Safety/medical concerns         Wheelchair     Assist Will patient use wheelchair at discharge?: Yes Type of Wheelchair: Manual    Wheelchair assist level: Minimal Assistance - Patient > 75% Max wheelchair distance: 25    Wheelchair 50 feet with 2 turns activity    Assist        Assist Level: Moderate Assistance - Patient 50 - 74%   Wheelchair 150 feet activity     Assist      Assist Level: Moderate Assistance - Patient 50 - 74%    Medical Problem List and Plan: 1.Left side hemiparesis dysarthria/dysphagiasecondary to right MCA infarction due to right ICA high-grade stenosis status post angioplasty  Continue CIR PT, OT, SLP- team conf in am   ELOS 12/16  2. Antithrombotics: -DVT/anticoagulation:Per neurology stopping Xarelto  -antiplatelet therapy: Aspirin81 mg daily 3. Pain Management:Tylenol as needed.  -CLBP add Kpad, also with lidoderm patch   4. Mood:Provide emotional support -antipsychotic agents: N/A 5. Neuropsych: This patientis notcapable of making decisions on hisown behalf. 6. Skin/Wound Care:Routine skin checks 7. Fluids/Electrolytes/Nutrition:Potassium 3.1 12/4   -supplement 8. Atrial fibrillation/tachybradycardia syndrome/pacemaker. Per Neuro and cardiology will resume Xarelto and monitor Hgb  Continue Toprol25 mg daily  Rate controlled on 12/5 9. SCC of the carotid gland. Follow-up outpatient 10. Hypertension. Toprol 25 mg daily.   Cozaar 25 started on 11/27- increased to 50mg  on 12/2   Vitals:   10/30/20 1948 10/31/20 0300  BP: (!) 174/77 (!) 165/66  Pulse: 63 66  Resp: 16 18  Temp: (!) 97.3 F (36.3 C) 97.8 F (36.6  C)  SpO2: 100% 96%  change clonidine to 0.1 mg BID   11. Hypothyroidism. Synthroid 12. Bacteremia.Completed Ancef 2 g every 8 hours 10-daycourse 13.  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 Dysphagia # 1 thins liquids. May resume diet, drank H20 for me through a straw without cough or wet voice 15. CKD stage II/III. Baseline 1.30-1.48.   Creatinine 0.98 on 11/27 16. Acute blood loss anemia with history of GI bleed 2019. Patient transfused 1 unit packed red blood cells 10/12/2020. Continue iron supplement. Protonix 40 mg twice daily.   Hemoglobin 8.9 on 11/28 stable vs 11/22 ok to resume Xarelto as rec per Neuro and cardio Xarelto started 11/29 repeat hgb 9.2 12/4   17. Prediabetes. Hemoglobin A1c 5.7. SSI.   Slightly labile, but relatively controlled     18. Chronic  systolic congestive heart failure-compensated, cards has signed off   Lasix 20 daily per cardiology, appreciate recs Filed Weights   10/29/20 0416 10/30/20 0444 10/31/20 0600  Weight: 63.7 kg 60.8 kg 60.9 kg    ? Technique no diuresis but 3kg wt loss recored in 24h   19,  Hypo K+ now on KCL 65meq BID LOS: 17 days A FACE TO  FACE EVALUATION WAS PERFORMED  Fernando [name] 10/31/2020, 8:02 AM

## 2020-10-31 NOTE — Progress Notes (Addendum)
Occupational Therapy Session Note  Patient Details  Name: Fernando [name] MRN: [id number] Date of Birth: 1927/04/22  Today's Date: 10/31/2020 OT Individual Time: 4496-7591 OT Individual Time Calculation (min): 58 min    Short Term Goals: Week 3:  OT Short Term Goal 1 (Week 3): Pt will complete sit to stand for progressing of LB selfcare with max assist. OT Short Term Goal 2 (Week 3): Pt will perform squat pivot transfer to the drop arm commode with max assist. OT Short Term Goal 3 (Week 3): Pt will thread 1 lower extremity when donning pants with mod A. OT Short Term Goal 4 (Week 3): Pt will don shirt with supervision.  Skilled Therapeutic Interventions/Progress Updates:    Pt received in stedy with NT and daughter present. NT transported pt to [place] in stedy. Discussed with daughter recs to purchase stedy for home use. Discussed benefits of bariatric vs standard stedy due to adjustable legs + out-swinging seat. Daughter states that pt does not yet have TTB or BSC at home yet. Family is currently building a ramp and pt has adjustable bed at home. Pt donned headphones of voice amplifier with min A to adjust over ears. Washed hands at sink with min A to retrieve soap/towel + turn on water + wash L hand thoroughly. Transported to Day Room in TIS 2/2 time constraints + energy conservation. At adjustable table, pt completed sit <> stand 5x with mod A to block LLE + to facilitate L hip extension + to maintain upright posture. Pt able to push up from both arm rests.  Able to initially stand for ~1 min before req seated rest break. In standing, pt first retrieved clothes pins from L side to place on bar on R side. Demonstrated improved posture when req to reach above head height. Pt then moved rings across rainbow arc with LUE 3x, req seated rest break between trials after standing for ~40 seconds. Rounded shoulders noted during upright posture, req consistent mod A correction throughout to facilitate L hip  extension. HR remained between high 50s - mid 70s bpm and spO2 remained at mid to high 90s% on RA throughout activity.   Seated in TIS at table, to facilitate trunk flexion in prep for func transfers, pushed towel forward 30x with close supervision. Returned to room in [employer]. Pt left in TIS with safety belt placed, adapted call bell in place, and daughter present.    Therapy Documentation Precautions:  Precautions Precautions: ICD/Pacemaker, Fall Precaution Comments: L sided weakness; very HOH; dysarthria Restrictions Weight Bearing Restrictions: No  Pain: no c/o pain   ADL: See Care Tool for more details.  Therapy/Group: Individual Therapy  Volanda Napoleon MS, OTR/L  Clyda Greener OTR/L    10/31/2020, 6:55 AM

## 2020-10-31 NOTE — Progress Notes (Signed)
Physical Therapy Session Note  Patient Details  Name: Fernando [name] MRN: [id number] Date of Birth: 1927-03-17  Today's Date: 10/31/2020 PT Individual Time: 2429-9806 PT Individual Time Calculation (min): 42 min   Short Term Goals: Week 3:  PT Short Term Goal 1 (Week 3): Pt will be able to perform bed <> chair transfer with mod assist PT Short Term Goal 2 (Week 3): Patient will complete sit <> stand transfers with CGA and LRAD PT Short Term Goal 3 (Week 3): Patient will ambulate 61f with LRAD, wc follow if needed and MinA  Skilled Therapeutic Interventions/Progress Updates:    Pt received semi-reclined in bed and agreeable to PT. Pt supine>sit mod assist for trunk and LE management. Pt attempted long sitting, but needed mod assist to come to sit on R EOB as stated above. PT donned ted hose, pants to knees and shoes in sitting with max assist for time management and energy conservation. Pt stated that his low back was bothering him, however did not rate his pain. PT elevated bed height to assist with sit>stand. Sit>stand +2 mod assist x3 in order to don pants total assist due to significant pushing noted this session posteriorly on RW. Pt demonstrates a heavy L lateral lean with flexed posture and distinct pushing with R foot into abduction/extension. When PT attempted to correct posture, pt immediately sat down, despite never reaching full standing position. Pt required use of stedy +2 mod assist due to safety at this time in [place], pt x2 sit>stands with min assist prior to sitting in TIS. Pt transported to day room in [place]   Sit>stands x4 with +2 mod assist from TIS in front of mirror. Pt instructed to maintain midline and keep head over R knee due to noted L posterior lean in wc prior to standing. Pt unable to maintain midline when attempting to reach a standing position and performs in similar fashion as above, however, decreased R foot abduction noted. Pt states  that he is fatigued. Returned to room in [place] total assist. Left sitting in TIS reclined with pillow therapeutically placed at L trunk for improved midline posture with call bell in reach, chair alarm on, and needs met. Dgt at bedside.   Communicated to [facility] OT about pt inability to come to stand today due to increased L lateral posterior lean for continued monitoring during OT session.   Therapy Documentation Precautions:  Precautions Precautions: ICD/Pacemaker, Fall Precaution Comments: L sided weakness; very HOH; dysarthria Restrictions Weight Bearing Restrictions: No   Therapy/Group: Individual Therapy  BGaylord Shih12/05/2020, 7:56 AM

## 2020-11-01 ENCOUNTER — Inpatient Hospital Stay (HOSPITAL_COMMUNITY): Payer: PPO | Admitting: Speech Pathology

## 2020-11-01 ENCOUNTER — Inpatient Hospital Stay (HOSPITAL_COMMUNITY): Payer: PPO

## 2020-11-01 ENCOUNTER — Inpatient Hospital Stay (HOSPITAL_COMMUNITY): Payer: PPO | Admitting: Occupational Therapy

## 2020-11-01 LAB — GLUCOSE, CAPILLARY
Glucose-Capillary: 82 mg/dL (ref 70–99)
Glucose-Capillary: 91 mg/dL (ref 70–99)
Glucose-Capillary: 102 mg/dL — ABNORMAL HIGH (ref 70–99)
Glucose-Capillary: 136 mg/dL — ABNORMAL HIGH (ref 70–99)

## 2020-11-01 MED ORDER — CLONIDINE HCL 0.1 MG PO TABS
0.1000 mg | ORAL_TABLET | Freq: Three times a day (TID) | ORAL | Status: DC
  Administered 2020-11-01 – 2020-11-08 (×16): 0.1 mg via ORAL
  Filled 2020-11-01 (×21): qty 1

## 2020-11-01 NOTE — Progress Notes (Signed)
Patient ID: Fernando [name], male   DOB: [birth year], 86 y.o.   MRN: [id number]  Team Conference Report to Patient/Family  Team Conference discussion was reviewed with the patient and caregiver, including goals, any changes in plan of care and target discharge date.  Patient and caregiver express understanding and are in agreement.  The patient has a target discharge date of 11/09/20.  Dyanne Iha 11/01/2020, 3:38 PM

## 2020-11-01 NOTE — Progress Notes (Signed)
Patient ID: Fernando [name], male   DOB: 06-Sep-1927, 86 y.o.   MRN: [id number]  Family not in room. SW made attempt to call family for team conference updates, no answer. Left VM.   [place], [place]

## 2020-11-01 NOTE — Progress Notes (Signed)
Occupational Therapy Session Note  Patient Details  Name: Fernando [name] MRN: [id number] Date of Birth: January 18, 1927  Today's Date: 11/01/2020 OT Individual Time: 4196-2229 OT Individual Time Calculation (min): 43 min    Short Term Goals: Week 3:  OT Short Term Goal 1 (Week 3): Pt will complete sit to stand for progressing of LB selfcare with max assist. OT Short Term Goal 2 (Week 3): Pt will perform squat pivot transfer to the drop arm commode with max assist. OT Short Term Goal 3 (Week 3): Pt will thread 1 lower extremity when donning pants with mod A. OT Short Term Goal 4 (Week 3): Pt will don shirt with supervision.  Skilled Therapeutic Interventions/Progress Updates:    Pt received semi-reclined in bed with NT present. Req min A to roll to R side then mod A to lift trunk and maintain midline orientation in sup > sitting EOB. Noted worsening L lean in static sitting, req resting on R elbow x2 to reset to mindline. Donned pants with overall total A + 2 (pt 15%) for sit<>stand to pull over hips. Req max A to thread BLE + cross BLE for easier access to pant bottoms when seated. Pt able to pull pants up to thighs when seated. Attempted sit<>stand x2, but due to worsening L lean + pt reports of back pain, req total A +2 (pt 15%).  He needed total +2 (pt 15%) for transfer to the TIS wheelchair via sliding board secondary to pushing to the left.   Reported improved back pain once up in w/c. Donned headphones for voice amplifier with setupA. At sink in TIS, shaved face with electric razor with LUE with min A to maintain orientation of blades on face. Shaved for ~5 min before fatigue. Therapist completed shaving with max assist for thoroughness + energy conservation. Pt washed face with min A to turn on/off water + locate soap.  Cont to demon mod amount of dyspnea with activity, spO2 at 100% on RA, HR at 65 bpm. Left in TIS with safety belt placed, adapted call bell in reach, all immediate needs met.    Therapy Documentation Precautions:  Precautions Precautions: ICD/Pacemaker, Fall Precaution Comments: L sided weakness; very HOH; dysarthria Restrictions Weight Bearing Restrictions: No  Pain: Pain Assessment Pain Scale: Faces Faces Pain Scale: Hurts even more Pain Location: Back Pain Intervention(s): Repositioned ADL: See Care Tool for more details.   Therapy/Group: Individual Therapy  Volanda Napoleon MS, OTR/L  11/01/2020, 9:52 AM

## 2020-11-01 NOTE — Progress Notes (Signed)
Spoke with Linna Hoff, PA R/T B/P. Instructed to hold 2000 dose of clonidine and start TID doses in the am.

## 2020-11-01 NOTE — Progress Notes (Signed)
[facility] PHYSICAL MEDICINE & REHABILITATION PROGRESS NOTE   Subjective/Complaints:  Coughing with breakfast mainly with solids as per CNA   ROS: Limited due to cognitive/behavioral    Objective:   No results found. No results for input(s): WBC, HGB, HCT, PLT in the last 72 hours. No results for input(s): NA, K, CL, CO2, GLUCOSE, BUN, CREATININE, CALCIUM in the last 72 hours.  Intake/Output Summary (Last 24 hours) at 11/01/2020 0835 Last data filed at 10/31/2020 1232 Gross per 24 hour  Intake 120 ml  Output --  Net 120 ml     Pressure Injury 10/14/20 Buttocks Left Deep Tissue Pressure Injury - Purple or maroon localized area of discolored intact skin or blood-filled blister due to damage of underlying soft tissue from pressure and/or shear. Purple/red area small fluid filled  (Active)  10/14/20 1700  Location: Buttocks  Location Orientation: Left  Staging: Deep Tissue Pressure Injury - Purple or maroon localized area of discolored intact skin or blood-filled blister due to damage of underlying soft tissue from pressure and/or shear.  Wound Description (Comments): Purple/red area small fluid filled blister noted at center  Present on Admission: Yes    Physical Exam: Vital Signs Blood pressure (!) 172/71, pulse 63, temperature (!) 97.3 F (36.3 C), temperature source Oral, resp. rate 20, height _0  (1.651 m), weight 63 kg, SpO2 99 %.  General: No acute distress Mood and affect are appropriate Heart: Regular rate and rhythm no rubs murmurs or extra sounds Lungs: Clear to auscultation, breathing unlabored, no rales or wheezes Abdomen: Positive bowel sounds, soft nontender to palpation, nondistended Extremities: No clubbing, cyanosis, or edema Skin: No evidence of breakdown, no evidence of rash  HOH Motor: Limited due to ability to follow commands-RUE: 5/5 proximal distal RLE: 4 -/5 proximal to distal LUE: 4 /5 proximal distal, appears unchanged LLE:  3/5 proximal  distal   Assessment/Plan: 1. Functional deficits which require 3+ hours per day of interdisciplinary therapy in a comprehensive inpatient rehab setting.  Physiatrist is providing close team supervision and 24 hour management of active medical problems listed below.  Physiatrist and rehab team continue to assess barriers to discharge/monitor patient progress toward functional and medical goals  Care Tool:  Bathing  Bathing activity did not occur: Refused Body parts bathed by patient: Face, Right arm, Left arm, Chest, Abdomen, Front perineal area, Right upper leg, Left upper leg   Body parts bathed by helper: Buttocks, Right lower leg, Left lower leg Body parts n/a: Front perineal area, Buttocks, Right upper leg, Left upper leg, Right lower leg, Left lower leg, Face (did not attempt LB bathing 2/2 time constraints)   Bathing assist Assist Level: Maximal Assistance - Patient 24 - 49% (dep in stedy for sit<>stand to clean buttocks)     Upper Body Dressing/Undressing Upper body dressing   What is the patient wearing?: Pull over shirt    Upper body assist Assist Level: Moderate Assistance - Patient 50 - 74%    Lower Body Dressing/Undressing Lower body dressing      What is the patient wearing?: Incontinence brief, Pants     Lower body assist Assist for lower body dressing: Dependent - Patient 0% (in stedy for sit<>stand)     Toileting Toileting    Toileting assist Assist for toileting: Dependent - Patient 0% (stedy for sit<>stand)     Transfers Chair/bed transfer  Transfers assist     Chair/bed transfer assist level: Dependent - mechanical lift     Locomotion Ambulation  Ambulation assist   Ambulation activity did not occur: Safety/medical concerns  Assist level: 2 helpers Assistive device: Walker-rolling Max distance: 20   Walk 10 feet activity   Assist  Walk 10 feet activity did not occur: Safety/medical concerns  Assist level: 2 helpers Assistive  device: Walker-rolling   Walk 50 feet activity   Assist           Walk 150 feet activity   Assist Walk 150 feet activity did not occur: Safety/medical concerns         Walk 10 feet on uneven surface  activity   Assist Walk 10 feet on uneven surfaces activity did not occur: Safety/medical concerns         Wheelchair     Assist Will patient use wheelchair at discharge?: Yes Type of Wheelchair: Manual    Wheelchair assist level: Minimal Assistance - Patient > 75% Max wheelchair distance: 25    Wheelchair 50 feet with 2 turns activity    Assist        Assist Level: Moderate Assistance - Patient 50 - 74%   Wheelchair 150 feet activity     Assist      Assist Level: Moderate Assistance - Patient 50 - 74%    Medical Problem List and Plan: 1.Left side hemiparesis dysarthria/dysphagiasecondary to right MCA infarction due to right ICA high-grade stenosis status post angioplasty  Continue CIR PT, OT, SLP- Team conference today please see physician documentation under team conference tab, met with team  to discuss problems,progress, and goals. Formulized individual treatment plan based on medical history, underlying problem and comorbidities.   ELOS 12/16  2. Antithrombotics: -DVT/anticoagulation:Per neurology stopping Xarelto  -antiplatelet therapy: Aspirin81 mg daily 3. Pain Management:Tylenol as needed.  -CLBP add Kpad, also with lidoderm patch   4. Mood:Provide emotional support -antipsychotic agents: N/A 5. Neuropsych: This patientis notcapable of making decisions on hisown behalf. 6. Skin/Wound Care:Routine skin checks 7. Fluids/Electrolytes/Nutrition:Potassium 3.1 12/4   -supplement 8. Atrial fibrillation/tachybradycardia syndrome/pacemaker. Per Neuro and cardiology will resume Xarelto and monitor Hgb  Continue Toprol25 mg daily  Rate controlled on 12/5 9. SCC of the carotid gland. Follow-up  outpatient 10. Hypertension. Toprol 25 mg daily.   Cozaar 25 started on 11/27- increased to 96m on 12/2   Vitals:   10/31/20 1923 11/01/20 0340  BP: (!) 175/77 (!) 172/71  Pulse: 62 63  Resp: 16 20  Temp: (!) 97.5 F (36.4 C) (!) 97.3 F (36.3 C)  SpO2: 100% 99%  change clonidine to 0.1 mg TID  11. Hypothyroidism. Synthroid 12. Bacteremia.Completed Ancef 2 g every 8 hours 10-daycourse 13.  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 Dysphagia # 1 thins liquids. May resume diet, drank H20 for me through a straw without cough or wet voice 15. CKD stage II/III. Baseline 1.30-1.48.   Creatinine 0.98 on 11/27 16. Acute blood loss anemia with history of GI bleed 2019. Patient transfused 1 unit packed red blood cells 10/12/2020. Continue iron supplement. Protonix 40 mg twice daily.   Hemoglobin 8.9 on 11/28 stable vs 11/22 ok to resume Xarelto as rec per Neuro and cardio Xarelto started 11/29 repeat hgb 9.2 12/4   17. Prediabetes. Hemoglobin A1c 5.7. SSI.   Slightly labile, but relatively controlled     18. Chronic  systolic congestive heart failure-compensated, cards has signed off   Lasix 20 daily per cardiology, appreciate recs Filed Weights   10/30/20 0444 10/31/20 0600 11/01/20 0340  Weight: 60.8 kg 60.9 kg 63 kg    ?  Technique no diuresis but 3kg wt loss recored in 24h   19,  Hypo K+ now on KCL 28mq BID recheck BMET in am  LOS: 18 days [place] Zana Biancardi 11/01/2020, 8:35 AM

## 2020-11-01 NOTE — Progress Notes (Signed)
Speech Language Pathology Daily Session Note  Patient Details  Name: Fernando [name] MRN: [id number] Date of Birth: 10/06/1927  Today's Date: 11/01/2020 SLP Individual Time: 1500-1530 SLP Individual Time Calculation (min): 30 min  Short Term Goals: Week 2: SLP Short Term Goal 1 (Week 2): Pt will use compensatory swallowing strategies with current diet with Min A verbal cues. SLP Short Term Goal 1 - Progress (Week 2): Met SLP Short Term Goal 2 (Week 2): Pt will demonstrate efficient mastication, oral clearance, and no decline in respiratory status when cosuming trials of Dys 2 textures across at least 2 session prior to upgrade. SLP Short Term Goal 2 - Progress (Week 2): Met SLP Short Term Goal 3 (Week 2): Pt will use compensatory strategies to increase speech intelligibility to 75% at the phrase level with Mod A multimodal cues. SLP Short Term Goal 3 - Progress (Week 2): Met SLP Short Term Goal 4 (Week 2): Pt will recall 1 safety precaution with Mod A cues for use of strategies and/or aids. SLP Short Term Goal 4 - Progress (Week 2): Met SLP Short Term Goal 5 (Week 2): Pt will identify 1 physical and 1 cognitive-linguistic deficit in setting of acute CVA with Mod A cues. SLP Short Term Goal 5 - Progress (Week 2): Met SLP Short Term Goal 6 (Week 2): Pt will demonstrate ability to problem solve basic functional and familiar situations with Min A cues. SLP Short Term Goal 6 - Progress (Week 2): Progressing toward goal  Skilled Therapeutic Interventions:   Patient seen for skilled ST session which was focused on family education as patient was very lethargic and unable to maintain alertness. [name] was present in room. SLP discussed patient's progress overall with swallow function, speech and cognitive function. Daughter had question/concern regarding patient being more dysarthric with speech later in day. SLP discussed how it is normal for a patient with CVA, etc to have some decreased  function when tired/fatigued but that this is not indicative of regression overall. Daughter has been pleased with patient's progress and care overall but she is stressed as she has a father in law who recently had a fall and so she is splitting time between both. SLP attempted to place patient's hearing aids in, but was not able to get them to fit (they are hearing aids that daughter got online and are not custom fit to patient's ear canal). Patient continues to benefit from skilled SLP intervention to maximize cognitive-linguistic, speech and swallow function prior to discharge.  Pain Pain Assessment Pain Scale: Faces Faces Pain Scale: No hurt  Therapy/Group: Individual Therapy   Sonia Baller, MA, CCC-SLP Speech Therapy

## 2020-11-01 NOTE — Progress Notes (Signed)
ANTICOAGULATION CONSULT NOTE - Follow Up Consult  Pharmacy Consult for Xarelto Indication: atrial fibrillation  No Known Allergies  Patient Measurements: Height: 5\' 5"  (165.1 cm) Weight: 63 kg (138 lb 14.2 oz) IBW/kg (Calculated) : 61.5  Vital Signs: Temp: 97.3 F (36.3 C) (12/08 0340) Temp Source: Oral (12/08 0340) BP: 172/71 (12/08 0340) Pulse Rate: 63 (12/08 0340)  Labs: No results for input(s): HGB, HCT, PLT, APTT, LABPROT, INR, HEPARINUNFRC, HEPRLOWMOCWT, CREATININE, CKTOTAL, CKMB, TROPONINIHS in the last 72 hours.  Estimated Creatinine Clearance: 39.1 mL/min (by C-G formula based on SCr of 1.07 mg/dL).  Assessment: Anticoag: on Xarelto pta (afib) but on hold due to GI bleeding and anemia . Xarelto resumed on 11/29 (okay'd by Neuro/cardiology) - Hgb 9.2>10>8.8>>8.9>>9.2 (12/4)  Goal of Therapy:  Therapeutic oral anticoagulation    Plan:  Xarelto 15mg  daily for CrCl<50 Pharmacy will sign off. Please reconsult for further dosing assitance. Will follow peripherally daily   Latorie Montesano S. Alford Highland, PharmD, BCPS Clinical Staff Pharmacist [email or web address]  Alford Highland, [employer] 11/01/2020,8:30 AM

## 2020-11-01 NOTE — Patient Care Conference (Signed)
Inpatient RehabilitationTeam Conference and Plan of Care Update Date: 11/01/2020   Time: 10:54 AM    Patient Name: Fernando [name]      Medical Record Number: [id number]  Date of Birth: [birth year] Sex: Male         Room/Bed: 4W17C/4W17C-01 Payor Info: Payor: [employer] / Plan: [employer] PPO / Product Type: *No Product type* /    Admit Date/Time:  10/14/2020  4:15 PM  Primary Diagnosis:  Right middle cerebral artery stroke [facility])  Hospital Problems: Principal Problem:   Right middle cerebral artery stroke [facility]) Active Problems:   Leukocytosis   Prediabetes   Dysphagia, post-stroke   Stage 3a chronic kidney disease [facility])    Expected Discharge Date: Expected Discharge Date: 11/09/20  Team Members Present: Physician leading conference: Dr. Alysia Penna Care Coodinator Present: Erlene Quan, BSW;Raymund Manrique Hervey Ard, RN, BSN, [place] Nurse Present: Other (comment) Loraine Leriche, RN) PT Present: Estevan Ryder, PT OT Present: Clyda Greener, OT SLP Present: Jettie Booze, CF-SLP PPS Coordinator present : Gunnar Fusi, Novella Olive, PT     Current Status/Progress Goal Weekly Team Focus  Bowel/Bladder   incontinent B/B. LBM 12/7  Prevent costipation or any other B/B related complication  toilet N2T   Swallow/Nutrition/ Hydration   Dys 2 textures, thin liquids, min A  Supervision  carryover swallow strategies (follow liquids with solids, slow rate, rest breaks, intermittent volitional coughing)   ADL's   min A for UB bathing, UB dressing, self-feeding, grooming, oral care when seated. Max A for stand-pivot, LB bathing, LB dressing.  min to mod assist  selfcare retraining, DME education, NMR, balance retraining, family/pt education, functional strength/transfer retrianing   Mobility   pt's mobility level and required assistance levels continues to fluctuate significantly - mod assist bed mobility, as little as mod assist sit<>stand using RW to as much as +2  heavy mod assist to stand in stedy, stedy transfers as unable to progress to stand pivot transfers yet, and ambulating up to 57ft using RW with mod assist and +2 w/c follow  min assist overall at short distance household ambulatory level  activity tolerance, transfer training, B LE strengthening, standing balance, sitting balance/trunk control, pt/family education   Communication   HOH, Min-Mod intelligibility strategies, cloes to baseline with dentures in per son  Min A  carryover speech intellgibility strategies   Safety/Cognition/ Behavioral Observations  Mod A  Min A  basic familiar problem sovling, recall with aids/strategies, attention, awareness   Pain   c/o occasional lower back  set a pain goal acceptable for patient  continue lidoderm patches, tylenol and k-pad as ordered   Skin   Left buttock DTI, MASD to perineal area  wound healing  prevent further complications     Discharge Planning:  D/C home with son (also dtr, niece and nephew) 1 level home, 5 steps to enter   Team Discussion: Labile BP; MD adjusting medications. Remains incontinent with suspected DTI to buttocks. Min-mod assist for upper body care and mod -max assist for lower body care/clothes management. Continue to note pushing left and inconsistent functional level; progress impaired by poor endurance. Continue with D2-thin diet Patient on target to meet rehab goals: no, PT downgraded goals due to inconsistent functional level with mod assist goals set with min assist cognitive goals.   *See Care Plan and progress notes for long and short-term goals.   Revisions to Treatment Plan:   Teaching Needs: Transfers, toileting, medications, skin care, etc.  Current Barriers to Discharge: Decreased caregiver  support, Home enviroment access/layout and Incontinence  Possible Resolutions to Barriers: Recommend special DME/Stedy for discharge Family education     Medical Summary Current Status: heart failure compensated,  coughing with meals but no evidence of PNA, fluctuating level activity  Barriers to Discharge: Medical stability;Wound care   Possible Resolutions to [employer] Focus: family training goals, SLP to cont swallow retraining, work with staff as MBS shows no aspiration   Continued Need for Acute Rehabilitation Level of Care: The patient requires daily medical management by a physician with specialized training in physical medicine and rehabilitation for the following reasons: Direction of a multidisciplinary physical rehabilitation program to maximize functional independence : Yes Medical management of patient stability for increased activity during participation in an intensive rehabilitation regime.: Yes Analysis of laboratory values and/or radiology reports with any subsequent need for medication adjustment and/or medical intervention. : Yes   I attest that I was present, lead the team conference, and concur with the assessment and plan of the team.   Dorien Chihuahua B 11/01/2020, 1:15 PM

## 2020-11-01 NOTE — Plan of Care (Signed)
  Problem: RH Balance Goal: LTG Patient will maintain dynamic sitting balance (PT) Description: LTG:  Patient will maintain dynamic sitting balance with assistance during mobility activities (PT) Flowsheets (Taken 11/01/2020 1237) LTG: Pt will maintain dynamic sitting balance during mobility activities with:: (downgraded due to patient progress) Contact Guard/Touching assist Note: downgraded due to patient progress   Problem: Sit to Stand Goal: LTG:  Patient will perform sit to stand with assistance level (PT) Description: LTG:  Patient will perform sit to stand with assistance level (PT) Flowsheets (Taken 11/01/2020 1237) LTG: PT will perform sit to stand in preparation for functional mobility with assistance level: (downgraded due to patient progress) Moderate Assistance - Patient 50 - 74% Note: downgraded due to patient progress   Problem: RH Bed Mobility Goal: LTG Patient will perform bed mobility with assist (PT) Description: LTG: Patient will perform bed mobility with assistance, with/without cues (PT). Flowsheets (Taken 11/01/2020 1237) LTG: Pt will perform bed mobility with assistance level of: (downgraded due to patient progress) Moderate Assistance - Patient 50 - 74% Note: downgraded due to patient progress   Problem: RH Bed to Chair Transfers Goal: LTG Patient will perform bed/chair transfers w/assist (PT) Description: LTG: Patient will perform bed to chair transfers with assistance (PT). Flowsheets (Taken 11/01/2020 1237) LTG: Pt will perform Bed to Chair Transfers with assistance level: (downgraded due to patient progress) Moderate Assistance - Patient 50 - 74% Note: downgraded due to patient progress   Problem: RH Car Transfers Goal: LTG Patient will perform car transfers with assist (PT) Description: LTG: Patient will perform car transfers with assistance (PT). Flowsheets (Taken 11/01/2020 1237) LTG: Pt will perform car transfers with assist:: (downgraded due to patient  progress) Total Assistance - Patient < 25% Note: downgraded due to patient progress   Problem: RH Wheelchair Mobility Goal: LTG Patient will propel w/c in controlled environment (PT) Description: LTG: Patient will propel wheelchair in controlled environment, # of feet with assist (PT) Flowsheets (Taken 11/01/2020 1237) LTG: Pt will propel w/c in controlled environ  assist needed:: (downgraded due to patient progress) Maximal Assistance - Patient 25 - 49% Note: downgraded due to patient progress

## 2020-11-01 NOTE — Progress Notes (Signed)
Physical Therapy Session Note  Patient Details  Name: Fernando [name] MRN: [id number] Date of Birth: [birth year]  Today's Date: 11/01/2020 PT Individual Time: 1105-1200 PT Individual Time Calculation (min): 55 min   Short Term Goals: Week 3:  PT Short Term Goal 1 (Week 3): Pt will be able to perform bed <> chair transfer with mod assist PT Short Term Goal 2 (Week 3): Patient will complete sit <> stand transfers with CGA and LRAD PT Short Term Goal 3 (Week 3): Patient will ambulate 64ft with LRAD, wc follow if needed and MinA  Skilled Therapeutic Interventions/Progress Updates:    Patient received sitting up in wc, agreeable to PT. He reports moderate low back pain, RN alerted to provide pain rx at end of session. PT providing repositioning, rest breaks and distractions to assist with pain management. PT propelling patient in wc to therapy gym for time management and energy conservation. He was able to complete sit <> stand with RW and MinA from increased height from TIS wc. Patient able to remain standing ~30s before requesting to sit down. He reports L knee "popping" as a "cue" to sit down and states that he experienced this prior to this hospitalization. Patient does maintain kyphotic posture in standing and slight posterior lean. Patient with uncontrolled descent into wc and was resistive to PT attempting to place R UE on wc arm rest to assist with lowering into wc. Patient completing sit <>stand x4. He was able to ambulate 29ft with RW + MinA and wc follow and then additional 13ft. Initially MinA quickly progressing to MaxA due to fatigue and stronger posterior lean. Patient with significant difficulty motor planning turn to sit onto therapy mat. Initial L posterior lean in sitting requiring MaxA to maintain appropriate midline posture. Patient able to complete sit <> stand from lower mat height with ModA and RW. Increase in pushing to the L with R UE/LE noted resulting in uncontrolled descent onto  mat. Patient remains resistive to assist to use R UE on mat to control descent. Patient transferring back to wc via squat pivot with MaxA x2 due to pushing with R UE. Patient remaining up in wc in room, pillows place to prevent significant L lateral lean in chair + support B UE, seatbelt alarm on, call light within reach.   Therapy Documentation Precautions:  Precautions Precautions: ICD/Pacemaker, Fall Precaution Comments: L sided weakness; very HOH; dysarthria Restrictions Weight Bearing Restrictions: No    Therapy/Group: Individual Therapy  [name], PT, DPT, CBIS  11/01/2020, 7:43 AM

## 2020-11-02 ENCOUNTER — Inpatient Hospital Stay (HOSPITAL_COMMUNITY): Payer: PPO | Admitting: Occupational Therapy

## 2020-11-02 ENCOUNTER — Inpatient Hospital Stay (HOSPITAL_COMMUNITY): Payer: PPO | Admitting: Speech Pathology

## 2020-11-02 ENCOUNTER — Inpatient Hospital Stay (HOSPITAL_COMMUNITY): Payer: PPO | Admitting: Physical Therapy

## 2020-11-02 LAB — BASIC METABOLIC PANEL
Anion gap: 8 (ref 5–15)
BUN: 21 mg/dL (ref 8–23)
CO2: 26 mmol/L (ref 22–32)
Calcium: 8.7 mg/dL — ABNORMAL LOW (ref 8.9–10.3)
Chloride: 103 mmol/L (ref 98–111)
Creatinine, Ser: 1.17 mg/dL (ref 0.61–1.24)
GFR, Estimated: 59 mL/min — ABNORMAL LOW (ref >60)
Glucose, Bld: 101 mg/dL — ABNORMAL HIGH (ref 70–99)
Potassium: 4.6 mmol/L (ref 3.5–5.1)
Sodium: 137 mmol/L (ref 135–145)

## 2020-11-02 LAB — GLUCOSE, CAPILLARY
Glucose-Capillary: 86 mg/dL (ref 70–99)
Glucose-Capillary: 91 mg/dL (ref 70–99)
Glucose-Capillary: 115 mg/dL — ABNORMAL HIGH (ref 70–99)
Glucose-Capillary: 107 mg/dL — ABNORMAL HIGH (ref 70–99)

## 2020-11-02 MED ORDER — POTASSIUM CHLORIDE CRYS ER 20 MEQ PO TBCR
20.0000 meq | EXTENDED_RELEASE_TABLET | Freq: Every day | ORAL | Status: DC
  Administered 2020-11-03 – 2020-11-11 (×9): 20 meq via ORAL
  Filled 2020-11-02 (×9): qty 1

## 2020-11-02 MED ORDER — HYDROCORTISONE (PERIANAL) 2.5 % EX CREA
TOPICAL_CREAM | Freq: Three times a day (TID) | CUTANEOUS | Status: DC
  Administered 2020-11-07: 1 via RECTAL
  Filled 2020-11-02 (×2): qty 28.35

## 2020-11-02 NOTE — Progress Notes (Signed)
Occupational Therapy Session Note  Patient Details  Name: Fernando [name] MRN: [id number] Date of Birth: [birth year]  Today's Date: 11/02/2020 OT Individual Time: 6606-3016 OT Individual Time Calculation (min): 56 min    Short Term Goals: Week 3:  OT Short Term Goal 1 (Week 3): Pt will complete sit to stand for progressing of LB selfcare with max assist. OT Short Term Goal 2 (Week 3): Pt will perform squat pivot transfer to the drop arm commode with max assist. OT Short Term Goal 3 (Week 3): Pt will thread 1 lower extremity when donning pants with mod A. OT Short Term Goal 4 (Week 3): Pt will don shirt with supervision.  Skilled Therapeutic Interventions/Progress Updates:    Pt worked on toileting, bathing, and dressing during session.  He was able to transfer from supine to sit EOB with mod assist.  Min guard assist for initial sitting balance progressing to supervision for static sitting.  Charlaine Dalton was utilized for transfer to the toilet as pt has soiled brief.  He was able to complete sit to stand with the Stedy at [facility] assist level.  Total assist was needed for completion of toilet hygiene and clothing management sit to stand in the [place].  He was then transferred out to the EOB with use of the [facility] for bathing and dressing.  He completed all UB bathing with supervision and min instructional cueing.  Mod assist needed for donning pullover shirt with mod instructional cueing as well for orientation of clothing as he initially place his UE in through the neck hole.  Educated pt on crossing LEs over the opposite knee for donning LB clothing.  He needed max assist for threading them over his lower feet and for standing to pull them up over hips, with mod assist for the sit to stand transition.  He was able to complete stand pivot transfer to the tilt in space wheelchair with mod assist using the RW for support.  Finished session with pt in the wheelchair with the call button and phone in reach and safety  belt in place.  Pt's granddaughter in the room as well throughout session and able to observe session.  Discussed benefit of the Stedy at home as pt is inconsistent with transfers and sit to stand at this time, and they plan on purchasing.    Therapy Documentation Precautions:  Precautions Precautions: ICD/Pacemaker,Fall Precaution Comments: L sided weakness; very HOH; dysarthria Restrictions Weight Bearing Restrictions: No  Pain: Pain Assessment Pain Scale: Faces Pain Score: 0-No pain Faces Pain Scale: Hurts a little bit Pain Type: Chronic pain Pain Location: Back Pain Orientation: Lower Pain Descriptors / Indicators: Discomfort ADL: See Care Tool Section for some details of mobility and selfcare  Therapy/Group: Individual Therapy  Heavenlee Maiorana OTR/L 11/02/2020, 12:20 PM

## 2020-11-02 NOTE — Progress Notes (Signed)
[place] PHYSICAL MEDICINE & REHABILITATION PROGRESS NOTE   Subjective/Complaints:  Signed letters for DUI court appearance excuse Do not feel pt will be safe to return to driving following CVA    ROS: Limited due to cognitive/behavioral    Objective:   No results found. No results for input(s): WBC, HGB, HCT, PLT in the last 72 hours. Recent Labs    11/02/20 0311  NA 137  K 4.6  CL 103  CO2 26  GLUCOSE 101*  BUN 21  CREATININE 1.17  CALCIUM 8.7*    Intake/Output Summary (Last 24 hours) at 11/02/2020 0757 Last data filed at 11/02/2020 0502 Gross per 24 hour  Intake 501 ml  Output --  Net 501 ml     Pressure Injury 10/14/20 Buttocks Left Deep Tissue Pressure Injury - Purple or maroon localized area of discolored intact skin or blood-filled blister due to damage of underlying soft tissue from pressure and/or shear. Purple/red area small fluid filled  (Active)  10/14/20 1700  Location: Buttocks  Location Orientation: Left  Staging: Deep Tissue Pressure Injury - Purple or maroon localized area of discolored intact skin or blood-filled blister due to damage of underlying soft tissue from pressure and/or shear.  Wound Description (Comments): Purple/red area small fluid filled blister noted at center  Present on Admission: Yes    Physical Exam: Vital Signs Blood pressure (!) 152/70, pulse 63, temperature 98.3 F (36.8 C), temperature source Oral, resp. rate 20, height 5\' 5"  (1.651 m), weight 61 kg, SpO2 93 %.   General: No acute distress Mood and affect are appropriate Heart: Regular rate and rhythm no rubs murmurs or extra sounds Lungs: Clear to auscultation, breathing unlabored, no rales or wheezes Abdomen: Positive bowel sounds, soft nontender to palpation, nondistended Extremities: No clubbing, cyanosis, or edema Skin: No evidence of breakdown, no evidence of rash HOH Motor: Limited due to ability to follow commands-RUE: 5/5 proximal distal RLE: 4 -/5  proximal to distal LUE: 4 /5 proximal distal, appears unchanged LLE:  3/5 proximal distal   Assessment/Plan: 1. Functional deficits which require 3+ hours per day of interdisciplinary therapy in a comprehensive inpatient rehab setting.  Physiatrist is providing close team supervision and 24 hour management of active medical problems listed below.  Physiatrist and rehab team continue to assess barriers to discharge/monitor patient progress toward functional and medical goals  Care Tool:  Bathing  Bathing activity did not occur: Refused Body parts bathed by patient: Face,Right arm,Left arm,Chest,Abdomen,Front perineal area,Right upper leg,Left upper leg   Body parts bathed by helper: Buttocks,Right lower leg,Left lower leg Body parts n/a: Front perineal area,Buttocks,Right upper leg,Left upper leg,Right lower leg,Left lower leg,Face (did not attempt LB bathing 2/2 time constraints)   Bathing assist Assist Level: Maximal Assistance - Patient 24 - 49% (dep in stedy for sit<>stand to clean buttocks)     Upper Body Dressing/Undressing Upper body dressing   What is the patient wearing?: Pull over shirt    Upper body assist Assist Level: Moderate Assistance - Patient 50 - 74%    Lower Body Dressing/Undressing Lower body dressing      What is the patient wearing?: Pants     Lower body assist Assist for lower body dressing: 2 Helpers     Toileting Toileting    Toileting assist Assist for toileting: Dependent - Patient 0% (stedy for sit<>stand)     Transfers Chair/bed transfer  Transfers assist     Chair/bed transfer assist level: 2 Helpers  Locomotion Ambulation   Ambulation assist   Ambulation activity did not occur: Safety/medical concerns  Assist level: 2 helpers Assistive device: Walker-rolling Max distance: 20   Walk 10 feet activity   Assist  Walk 10 feet activity did not occur: Safety/medical concerns  Assist level: 2 helpers Assistive device:  Walker-rolling   Walk 50 feet activity   Assist           Walk 150 feet activity   Assist Walk 150 feet activity did not occur: Safety/medical concerns         Walk 10 feet on uneven surface  activity   Assist Walk 10 feet on uneven surfaces activity did not occur: Safety/medical concerns         Wheelchair     Assist Will patient use wheelchair at discharge?: Yes Type of Wheelchair: Manual    Wheelchair assist level: Minimal Assistance - Patient > 75% Max wheelchair distance: 25    Wheelchair 50 feet with 2 turns activity    Assist        Assist Level: Moderate Assistance - Patient 50 - 74%   Wheelchair 150 feet activity     Assist      Assist Level: Moderate Assistance - Patient 50 - 74%    Medical Problem List and Plan: 1.Left side hemiparesis dysarthria/dysphagiasecondary to right MCA infarction due to right ICA high-grade stenosis status post angioplasty  Continue CIR PT, OT, SLP- ELOS 12/16  2. Antithrombotics: -DVT/anticoagulation:Per neurology stopping Xarelto  -antiplatelet therapy: Aspirin81 mg daily 3. Pain Management:Tylenol as needed.  -CLBP add Kpad, also with lidoderm patch   4. Mood:Provide emotional support -antipsychotic agents: N/A 5. Neuropsych: This patientis notcapable of making decisions on hisown behalf. 6. Skin/Wound Care:Routine skin checks 7. Fluids/Electrolytes/Nutrition:Potassium 3.1 12/4   -supplement 8. Atrial fibrillation/tachybradycardia syndrome/pacemaker. Per Neuro and cardiology will resume Xarelto and monitor Hgb  Continue Toprol25 mg daily  Rate controlled on 12/5 9. SCC of the carotid gland. Follow-up outpatient 10. Hypertension. Toprol 25 mg daily.   Cozaar 25 started on 11/27- increased to 50mg  on 12/2   Vitals:   11/01/20 2039 11/02/20 0502  BP: (!) 110/54 (!) 152/70  Pulse: 62 63  Resp: 20 20  Temp: 97.7 F (36.5 C) 98.3 F (36.8 C)   SpO2: 94% 93%  change clonidine to 0.1 mg TID  11. Hypothyroidism. Synthroid 12. Bacteremia.Completed Ancef 2 g every 8 hours 10-daycourse 13.  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 Dysphagia # 1 thins liquids. May resume diet, drank H20 for me through a straw without cough or wet voice 15. CKD stage II/III. Baseline 1.30-1.48.   Creatinine 0.98 on 11/27 16. Acute blood loss anemia with history of GI bleed 2019. Patient transfused 1 unit packed red blood cells 10/12/2020. Continue iron supplement. Protonix 40 mg twice daily.   Hemoglobin 8.9 on 11/28 stable vs 11/22 ok to resume Xarelto as rec per Neuro and cardio Xarelto started 11/29 repeat hgb 9.2 12/4   17. Prediabetes. Hemoglobin A1c 5.7. SSI.   Slightly labile, but relatively controlled     18. Chronic  systolic congestive heart failure-compensated, cards has signed off   Lasix 20 daily per cardiology, appreciate recs Filed Weights   10/31/20 0600 11/01/20 0340 11/02/20 0502  Weight: 60.9 kg 63 kg 61 kg    ? Technique no diuresis but 3kg wt loss recored in 24h   19,  Hypo K+ now on KCL 6meq BID recheck BMET in am  K+ up to 4.6  may reduce KCL to 7meq daily LOS: 19 days A FACE TO FACE EVALUATION WAS PERFORMED  Charlett Blake 11/02/2020, 7:57 AM

## 2020-11-02 NOTE — Progress Notes (Signed)
Speech Language Pathology Daily Session Note  Patient Details  Name: Fernando [name] MRN: [id number] Date of Birth: 14-Mar-1927  Today's Date: 11/02/2020 SLP Individual Time: 7680-8811 SLP Individual Time Calculation (min): 42 min  Short Term Goals: Week 3: SLP Short Term Goal 1 (Week 3): STG=LTG due to remaining length of stay  Skilled Therapeutic Interventions: Pt was seen for skilled ST targeting cognitive skills. SLP facilitated session with a medication management task, given that this is something he managed Mod I at home prior to admission. Pt unable to recall current meds, but recalled general functions of familiar medications with Min A question cues. SLP assisted pt in creating a list of current medications as memory aid. He then used list to organize TID pill box according to list. Although he demonstrated ability to arrange 1X daily pills in the box with Min A verbal and visual cues, increased Max A required for problem solving and Mod A for error awareness when attempting to organize a 3X daily pill. Education provided regarding need for assistance with medication management when he discharges home - no family present but will be covered at family education prior to discharge. Pt left sitting in chair with alarm set and needs within reach. Continue per current plan of care.        Pain Pain Assessment Pain Score: Asleep Faces Pain Scale: Hurts even more Pain Type: Acute pain Pain Location: Back Pain Orientation: Upper Pain Intervention(s): Medication (See eMAR)  Therapy/Group: Individual Therapy  [name] 11/02/2020, 7:19 AM

## 2020-11-02 NOTE — Progress Notes (Signed)
Patient ID: Fernando [name], male   DOB: [birth year], 86 y.o.   MRN: [id number]   SW called son to inform him letters are ready from physician. No answer, left VM. Letters are in [place] office if inquiring.   [place], [place]

## 2020-11-02 NOTE — Progress Notes (Signed)
Patient alert and orientedx 4, just finished eating dinner. BP and pulse within normal limits. No s/s of distress noted at this time. Patient resting with call bell in reach.  Audie Clear, LPN

## 2020-11-02 NOTE — Progress Notes (Signed)
Physical Therapy Session Note  Patient Details  Name: Fernando [name] MRN: [id number] Date of Birth: 1927-09-08  Today's Date: 11/02/2020 PT Individual Time: 1339-1419 PT Individual Time Calculation (min): 40 min   Short Term Goals: Week 3:  PT Short Term Goal 1 (Week 3): Pt will be able to perform bed <> chair transfer with mod assist PT Short Term Goal 2 (Week 3): Patient will complete sit <> stand transfers with CGA and LRAD PT Short Term Goal 3 (Week 3): Patient will ambulate 64ft with LRAD, wc follow if needed and MinA  Skilled Therapeutic Interventions/Progress Updates:    Pt received sitting in TIS w/c and noticed to be soiled through clothing in urine. Educated pt on importance of notifying staff when he feels the urge and/or if he is incontinent to allow proper hygiene and decrease risk of skin break down - pt verbalized understanding. Pt agreeable to therapy session. Sit>stand w/c>stedy with min assist for balance/lifting - demos improved upright trunk posture with increased hip extension. Transported in/out of bathroom in stedy. Sit<>stand to/from stedy seat with CGA/min assist - requires increasing assist with fatigue. Standing in stedy with B UE support on bar with CGA during total assist LB clothing management. Sitting on toilet performed total assist LB clothing management for cleanliness. Pt able to continently void further on toilet. Sit>stand BSC over toilet>stedy with min assist for lifting - standing in stedy with B UE support on bar and min assist to maintain upright while +2 assist provided total assist peri-care. RN notified and present to change dressing on sacrum due to it being soiled. Standing in stedy at sink using forearm support on bar with heavy min assist to maintain upright while performing hand hygiene. Stedy transfer back to EOB. Sit>stand EOB>RW with min asssist for lifting into standing - cuing to push up with L UE on bed and not pull up with B UEs on RW. Gait  training ~59ft x2 EOB<>w/c using RW including 2x 90degree turns with min assist of 1 person for balance and AD management then mod assist when turning to sit as pt initiates sit prior to turning fully - +2 assist present for safety in case w/c follow needed. Pt requesting to return to bed. Sit>supine with mod assist for B LE management into bed. With education on importance of pressure relief pt agreeable to repositioning in R sidelying stating this is how he sleeps at home. Supine>R sidelying using bedrails with heavy max assist to reposition and provided therapeutic placement of pillows for pressure relief. Left in R sidelying with soft call button at hands and bed alarm on.  Therapy Documentation Precautions:  Precautions Precautions: ICD/Pacemaker,Fall Precaution Comments: L sided weakness; very HOH; dysarthria Restrictions Weight Bearing Restrictions: No  Pain: Reports some back pain during session - provided rest breaks, mobility, and repositioning for pain management.   Therapy/Group: Individual Therapy  Tawana Scale , PT, DPT, CSRS  11/02/2020, 12:36 PM

## 2020-11-03 ENCOUNTER — Inpatient Hospital Stay (HOSPITAL_COMMUNITY): Payer: PPO | Admitting: Speech Pathology

## 2020-11-03 ENCOUNTER — Inpatient Hospital Stay (HOSPITAL_COMMUNITY): Payer: PPO | Admitting: Physical Therapy

## 2020-11-03 ENCOUNTER — Inpatient Hospital Stay (HOSPITAL_COMMUNITY): Payer: PPO | Admitting: Occupational Therapy

## 2020-11-03 LAB — GLUCOSE, CAPILLARY
Glucose-Capillary: 90 mg/dL (ref 70–99)
Glucose-Capillary: 113 mg/dL — ABNORMAL HIGH (ref 70–99)
Glucose-Capillary: 113 mg/dL — ABNORMAL HIGH (ref 70–99)
Glucose-Capillary: 119 mg/dL — ABNORMAL HIGH (ref 70–99)

## 2020-11-03 NOTE — Progress Notes (Signed)
Patient ID: Fernando [name], male   DOB: [birth year], 86 y.o.   MRN: [id number]   Signed letters provided to patient son  Erlene Quan. BSW [phone]

## 2020-11-03 NOTE — Progress Notes (Signed)
Speech Language Pathology Daily Session Note  Patient Details  Name: Dozier Berkovich MRN: [id number] Date of Birth: 1926-12-25  Today's Date: 11/03/2020 SLP Individual Time: 1300-1343 SLP Individual Time Calculation (min): 43 min  Short Term Goals: Week 3: SLP Short Term Goal 1 (Week 3): STG=LTG due to remaining length of stay  Skilled Therapeutic Interventions: Pt was seen for skilled ST targeting dysphagia and cognitive skills. Upon arrival pt was still working on his Dys 2/thin lunch with NT. SLP took over full supervision, providing Min A verbal cues for use of swallow strategies. His mastication was mildly prolonged and he reported preference for mostly pureed items on tray. Recommend continue current diet but send extra gravy/condiments to aid in moisture (as pt's salivary glands are compromised). Will continue monitor tolerance efficiency of intake on Dys 2. SLP further facilitated session with Mod A verbal and visual cues for problem solving when interpreting weekly weather forecasts. Decreased Min A verbal and visual cues required for problem solving and error awareness during a basic matching task, in which pt matched colored blocks to a 9 color pattern board. Pt left laying in bed with alarm set and needs within reach. Continue per current plan of care.          Pain Pain Assessment Pain Scale: 0-10 Pain Score: 0-No pain  Therapy/Group: Individual Therapy  [name] 11/03/2020, 7:25 AM

## 2020-11-03 NOTE — Progress Notes (Signed)
[place] PHYSICAL MEDICINE & REHABILITATION PROGRESS NOTE   Subjective/Complaints:   Sleepy this am but arouses to voice, very HOH and not wearing pocket talker    ROS: Limited due to cognitive/behavioral    Objective:   No results found. No results for input(s): WBC, HGB, HCT, PLT in the last 72 hours. Recent Labs    11/02/20 0311  NA 137  K 4.6  CL 103  CO2 26  GLUCOSE 101*  BUN 21  CREATININE 1.17  CALCIUM 8.7*    Intake/Output Summary (Last 24 hours) at 11/03/2020 0749 Last data filed at 11/02/2020 1240 Gross per 24 hour  Intake 240 ml  Output --  Net 240 ml     Pressure Injury 10/14/20 Buttocks Left Deep Tissue Pressure Injury - Purple or maroon localized area of discolored intact skin or blood-filled blister due to damage of underlying soft tissue from pressure and/or shear. Purple/red area small fluid filled  (Active)  10/14/20 1700  Location: Buttocks  Location Orientation: Left  Staging: Deep Tissue Pressure Injury - Purple or maroon localized area of discolored intact skin or blood-filled blister due to damage of underlying soft tissue from pressure and/or shear.  Wound Description (Comments): Purple/red area small fluid filled blister noted at center  Present on Admission: Yes    Physical Exam: Vital Signs Blood pressure (!) 141/76, pulse 66, temperature (!) 97.5 F (36.4 C), temperature source Oral, resp. rate 18, height 5\' 5"  (1.651 m), weight 59.4 kg, SpO2 100 %.  General: No acute distress Mood and affect are appropriate Heart: Regular rate and rhythm no rubs murmurs or extra sounds Lungs: Clear to auscultation, breathing unlabored, no rales or wheezes Abdomen: Positive bowel sounds, soft nontender to palpation, nondistended Extremities: No clubbing, cyanosis, or edema Skin: No evidence of breakdown, no evidence of rash  HOH Motor: Limited due to ability to follow commands-RUE: 5/5 proximal distal RLE: 4 -/5 proximal to distal LUE: 4 /5  proximal distal, appears unchanged LLE:  3/5 proximal distal   Assessment/Plan: 1. Functional deficits which require 3+ hours per day of interdisciplinary therapy in a comprehensive inpatient rehab setting.  Physiatrist is providing close team supervision and 24 hour management of active medical problems listed below.  Physiatrist and rehab team continue to assess barriers to discharge/monitor patient progress toward functional and medical goals  Care Tool:  Bathing  Bathing activity did not occur: Refused Body parts bathed by patient: Right arm,Left arm,Chest,Abdomen,Right upper leg,Left upper leg,Face   Body parts bathed by helper: Buttocks,Right lower leg,Left lower leg Body parts n/a: Front perineal area,Buttocks,Right upper leg,Left upper leg,Right lower leg,Left lower leg,Face (did not attempt LB bathing 2/2 time constraints)   Bathing assist Assist Level: Moderate Assistance - Patient 50 - 74%     Upper Body Dressing/Undressing Upper body dressing   What is the patient wearing?: Pull over shirt    Upper body assist Assist Level: Moderate Assistance - Patient 50 - 74%    Lower Body Dressing/Undressing Lower body dressing      What is the patient wearing?: Pants,Incontinence brief     Lower body assist Assist for lower body dressing: Maximal Assistance - Patient 25 - 49%     Toileting Toileting    Toileting assist Assist for toileting: Dependent - Patient 0%     Transfers Chair/bed transfer  Transfers assist     Chair/bed transfer assist level: Moderate Assistance - Patient 50 - 74% (stand pivot) Chair/bed transfer assistive device: Environmental consultant  Ambulation   Ambulation assist   Ambulation activity did not occur: Safety/medical concerns  Assist level: 2 helpers (min A of 1 and +2 for safety) Assistive device: Walker-rolling Max distance: 32ft   Walk 10 feet activity   Assist  Walk 10 feet activity did not occur: Safety/medical  concerns  Assist level: 2 helpers (min A of 1 and +2 for safety) Assistive device: Walker-rolling   Walk 50 feet activity   Assist           Walk 150 feet activity   Assist Walk 150 feet activity did not occur: Safety/medical concerns         Walk 10 feet on uneven surface  activity   Assist Walk 10 feet on uneven surfaces activity did not occur: Safety/medical concerns         Wheelchair     Assist Will patient use wheelchair at discharge?: Yes Type of Wheelchair: Manual    Wheelchair assist level: Minimal Assistance - Patient > 75% Max wheelchair distance: 25    Wheelchair 50 feet with 2 turns activity    Assist        Assist Level: Moderate Assistance - Patient 50 - 74%   Wheelchair 150 feet activity     Assist      Assist Level: Moderate Assistance - Patient 50 - 74%    Medical Problem List and Plan: 1.Left side hemiparesis dysarthria/dysphagiasecondary to right MCA infarction due to right ICA high-grade stenosis status post angioplasty  Continue CIR PT, OT, SLP- ELOS 12/16  2. Antithrombotics: -DVT/anticoagulation:Per neurology stopping Xarelto  -antiplatelet therapy: Aspirin81 mg daily 3. Pain Management:Tylenol as needed.  -CLBP add Kpad, also with lidoderm patch   4. Mood:Provide emotional support -antipsychotic agents: N/A 5. Neuropsych: This patientis notcapable of making decisions on hisown behalf. 6. Skin/Wound Care:Routine skin checks 7. Fluids/Electrolytes/Nutrition:Potassium 3.1 12/4   -supplement 8. Atrial fibrillation/tachybradycardia syndrome/pacemaker. Per Neuro and cardiology will resume Xarelto and monitor Hgb  Continue Toprol25 mg daily  Rate controlled on 12/5 9. SCC of the carotid gland. Follow-up outpatient 10. Hypertension. Toprol 25 mg daily.   Cozaar 25 started on 11/27- increased to 50mg  on 12/2   Vitals:   11/02/20 2015 11/03/20 0525  BP: (!) 144/64 (!)  141/76  Pulse: 66 66  Resp: 18 18  Temp: 98.8 F (37.1 C) (!) 97.5 F (36.4 C)  SpO2: 100% 100%  change clonidine to 0.1 mg TID, improving, no further changes at this time  11. Hypothyroidism. Synthroid 12. Bacteremia.Completed Ancef 2 g every 8 hours 10-daycourse 13.  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 Dysphagia # 1 thins liquids. May resume diet, drank H20 for me through a straw without cough or wet voice 15. CKD stage II/III. Baseline 1.30-1.48.   Creatinine 0.98 on 11/27, recheck Monday  16. Acute blood loss anemia with history of GI bleed 2019. Patient transfused 1 unit packed red blood cells 10/12/2020. Continue iron supplement. Protonix 40 mg twice daily.   Hemoglobin 8.9 on 11/28 stable vs 11/22 ok to resume Xarelto as rec per Neuro and cardio Xarelto started 11/29 repeat hgb 9.2 12/4   17. Prediabetes. Hemoglobin A1c 5.7. SSI.   Slightly labile, but relatively controlled     18. Chronic  systolic congestive heart failure-compensated, cards has signed off   Lasix 20 daily per cardiology, appreciate recs Filed Weights   11/01/20 0340 11/02/20 0502 11/03/20 0500  Weight: 63 kg 61 kg 59.4 kg    ? Technique no diuresis but  3kg wt loss recored in 24h   19,  Hypo K+  recheck BMET Monday  K+ up to 4.6 may reduce KCL to 42meq daily on 12/10 LOS: 20 days A FACE TO [place] Fernando [name] 11/03/2020, 7:49 AM

## 2020-11-03 NOTE — Progress Notes (Signed)
Physical Therapy Session Note  Patient Details  Name: Fernando [name] MRN: [id number] Date of Birth: 1927/01/12  Today's Date: 11/03/2020 PT Individual Time: 1125-1208 PT Individual Time Calculation (min): 43 min   Short Term Goals: Week 3:  PT Short Term Goal 1 (Week 3): Pt will be able to perform bed <> chair transfer with mod assist PT Short Term Goal 2 (Week 3): Patient will complete sit <> stand transfers with CGA and LRAD PT Short Term Goal 3 (Week 3): Patient will ambulate 35ft with LRAD, wc follow if needed and MinA  Skilled Therapeutic Interventions/Progress Updates:    Pt received sitting in TIS w/c with his son present and pt demonstrating fatigue with heavy eyelids and dosing off while sitting up. Despite this, pt agreeable to therapy session.  Therapy session focused on discharge planning with the following items discussed with pt and his son:  - confirmed they are in process of building a ramp for home entry - son reports that the family has some type of basic w/c available and is planning to bring picture to share with therapists - discussed purchasing bedrails to place on the new full sized mattress with adjustable base the family recently purchased for pt - requested pt's son to measure height of bed - discussed pt's fluctuating mobility levels with likely need to use w/c in the home as pt is inconsistent with his ambulation - pt's son reports he prefers the [place] chair and therapist discussed pros/cons regarding pressure relief but inability to use for community due to not fitting in vehicle - will arrange custom w/c consultation  - son reports the family is currently in process of purchasing a stedy - reports the pt has a [employer] and that the son has a higher vehicle that is difficult for pt to get in/out of - discussed having family bring pt's vehicle to try real car transfer if felt it would be beneficial closer to D/C - requested pt's son measure height of pt's vehicle   seat   With encouragement, pt agreeable for timed toileting and therapist reinforced education with son on importance of assisting pt to bathroom on a schedule at home to improve continence. Sit>stand w/c>stedy using B UE support on bar with min assist for lifting. Stedy transport in/out bathroom. Sit<>stands to/from stedy seat with CGA. Standing with B UE support on stedy bar performed total assist LB clothing management - continent of bladder and small BM - total assist peri-care with pt demonstrating ability to maintain standing longer duration to allow full completion of peri-care and clothing management in 1 stand. Sit>stand from BSC>stedy with min assist for lifting from this low height. Stedy transport to EOB as pt requesting to lie down due to fatigue. Sit>supine with mod assist for B LE management into bed and trunk descent. Pt left supine in bed with needs in reach, bed alarm on, and pt's son present.    Therapy Documentation Precautions:  Precautions Precautions: ICD/Pacemaker,Fall Precaution Comments: L sided weakness; very HOH; dysarthria Restrictions Weight Bearing Restrictions: No  Pain: Reports some back pain initially upon lying down in the bed - assisted with repositioning for pain management.   Therapy/Group: Individual Therapy  Tawana Scale , PT, DPT, CSRS  11/03/2020, 10:18 AM

## 2020-11-03 NOTE — Progress Notes (Signed)
Occupational Therapy Weekly Progress Note  Patient Details  Name: Fernando [name] MRN: [id number] Date of Birth: July 06, 1927  Beginning of progress report period: October 28, 2020 End of progress report period: November 03, 2020  Today's Date: 11/03/2020 OT Individual Time: 9735-3299 OT Individual Time Calculation (min): 62 min    Patient has met 1 of 4 short term goals.  Pt currently is set-up A for oral care, grooming, UB dressing seated in w/c. Min A for UB bathing sitting EOB. Assist levels for func transfers cont to vary drastically with fatigue. Will req mod to total + 2 (pt ~20%) for sit<>stand to donn/doff pants, bathe buttocks, or perform posterior pericare. Toilet transfer consistently dep with stedy. Stand-pivot with RW from bed to TIS is total A + 2 (pt 20%). Overall, improved BUE function, but cont to primarily limited by decreased activity tolerance and chronic back pain.  Patient continues to demonstrate the following deficits: muscle weakness, decreased cardiorespiratoy endurance and pain and therefore will continue to benefit from skilled OT intervention to enhance overall performance with BADL and Reduce care partner burden.  Patient not progressing toward long term goals.  See goal revision..  Plan of care revisions: Goals related to functional transfers downgraded to max A 2/2 pt progress slower than anticipated. .  OT Short Term Goals Week 4:  OT Short Term Goal 1 (Week 4): Pt will complete sit to stand for progressing of LB selfcare with max assist. OT Short Term Goal 2 (Week 4): Pt will perform squat pivot transfer to the drop arm commode with max assist. OT Short Term Goal 3 (Week 4): Pt will thread 1 lower extremity when donning pants with mod A.  Skilled Therapeutic Interventions/Progress Updates:    Pt received semi-reclined in bed with LPN present, bed alarm engaged. Side roll > R with mod A. Sup > sit EOB max A to lift trunk. Pt c/o of back pain, LPN aware.  Sitting EOB, laid back down horizontally across bed on pillow with BLE off bed x3 2/2 back pain and fatigue with min A to control descent, req max A to return to sit. Pt found in soiled brief. Req total A to doff/don brief and for total pericare. Dep in stedy for sit<>stand transfer. Attempted sit<>stand x2 without stedy, pt demonstrating sig L lean and inability to maintain upright posture. Donned pants with max A to thread BLE and pull over hips in standing. Dep in stedy for sit<>stand transfer. Donned bilat gripper socks with total A to thread of bilat feet. Transferred to [place] in stedy. At sink, pt washed face, hands, inserted dentures with set-up A. Brushed teeth with set-up A + min gestural + VCs to recall putting on toothpaste on brush first. Alphonse Guild entered at end of session, discussed DME recs for TTB and current pt progress regarding variability in transfer assist levels Pt left in TIS slightly tilted back, safety belt placed, adapted call bell in reach, and son present.  Therapy Documentation Precautions:  Precautions Precautions: ICD/Pacemaker,Fall Precaution Comments: L sided weakness; very HOH; dysarthria Restrictions Weight Bearing Restrictions: No  Pain: Pain Assessment Pain Scale: Faces Faces Pain Scale: Hurts even more Pain Type: Chronic pain Pain Location: Back Pain Intervention(s): Repositioned ADL: See Care Tool for more details.   Therapy/Group: Individual Therapy  Volanda Napoleon See Care Tool for more details. Clyda Greener OTR/L  11/03/2020, 9:52 AM

## 2020-11-03 NOTE — Progress Notes (Signed)
Patient ID: Fernando [name], male   DOB: 1927/10/26, 86 y.o.   MRN: [id number]  DME ordered through [employer]

## 2020-11-03 NOTE — Plan of Care (Signed)
  Problem: Consults Goal: RH STROKE PATIENT EDUCATION Description: See Patient Education module for education specifics  Outcome: Progressing   Problem: RH BOWEL ELIMINATION Goal: RH STG MANAGE BOWEL WITH ASSISTANCE Description: STG Manage Bowel with Assistance. Mod Outcome: Progressing   Problem: RH BLADDER ELIMINATION Goal: RH STG MANAGE BLADDER WITH ASSISTANCE Description: STG Manage Bladder With Assistance Mod Outcome: Progressing   Problem: RH SKIN INTEGRITY Goal: RH STG SKIN FREE OF INFECTION/BREAKDOWN Description: Skin free of breakdown and infection with mod assist Outcome: Progressing   Problem: RH COGNITION-NURSING Goal: RH STG USES MEMORY AIDS/STRATEGIES W/ASSIST TO PROBLEM SOLVE Description: STG Uses Memory Aids/Strategies With Assistance to Problem Solve. mod Outcome: Progressing

## 2020-11-03 NOTE — Plan of Care (Signed)
  Problem: RH Balance Goal: LTG: Patient will maintain dynamic sitting balance (OT) Description: LTG:  Patient will maintain dynamic sitting balance with assistance during activities of daily living (OT) Flowsheets (Taken 11/03/2020 1251) LTG: Pt will maintain dynamic sitting balance during ADLs with: Maximal Assistance - Patient 25 - 49%   Problem: Sit to Stand Goal: LTG:  Patient will perform sit to stand in prep for activites of daily living with assistance level (OT) Description: LTG:  Patient will perform sit to stand in prep for activites of daily living with assistance level (OT) Flowsheets (Taken 11/03/2020 1251) LTG: PT will perform sit to stand in prep for activites of daily living with assistance level: Maximal Assistance - Patient 25 - 49%   Problem: RH Dressing Goal: LTG Patient will perform lower body dressing w/assist (OT) Description: LTG: Patient will perform lower body dressing with assist, with/without cues in positioning using equipment (OT) Flowsheets (Taken 11/03/2020 1251) LTG: Pt will perform lower body dressing with assistance level of: Maximal Assistance - Patient 25 - 49%   Problem: RH Toilet Transfers Goal: LTG Patient will perform toilet transfers w/assist (OT) Description: LTG: Patient will perform toilet transfers with assist, with/without cues using equipment (OT) Flowsheets (Taken 11/03/2020 1251) LTG: Pt will perform toilet transfers with assistance level of: Maximal Assistance - Patient 25 - 49%   Problem: RH Tub/Shower Transfers Goal: LTG Patient will perform tub/shower transfers w/assist (OT) Description: LTG: Patient will perform tub/shower transfers with assist, with/without cues using equipment (OT) Flowsheets (Taken 11/03/2020 1251) LTG: Pt will perform tub/shower stall transfers with assistance level of: Maximal Assistance - Patient 25 - 49%

## 2020-11-03 NOTE — Plan of Care (Signed)
  Problem: RH Balance Goal: LTG: Patient will maintain dynamic sitting balance (OT) Description: LTG:  Patient will maintain dynamic sitting balance with assistance during activities of daily living (OT) 11/03/2020 1627 by Volanda Napoleon, OT Flowsheets (Taken 11/03/2020 1627) LTG: Pt will maintain dynamic sitting balance during ADLs with: (goal downgraded 2/2 slow pt progress) Moderate Assistance - Patient 50 - 74% Note: goal downgraded 2/2 slow pt progress 11/03/2020 1251 by Volanda Napoleon, OT Flowsheets (Taken 11/03/2020 1251) LTG: Pt will maintain dynamic sitting balance during ADLs with: Maximal Assistance - Patient 25 - 49%   Problem: Sit to Stand Goal: LTG:  Patient will perform sit to stand in prep for activites of daily living with assistance level (OT) Description: LTG:  Patient will perform sit to stand in prep for activites of daily living with assistance level (OT) 11/03/2020 1627 by Volanda Napoleon, OT Flowsheets (Taken 11/03/2020 1627) LTG: PT will perform sit to stand in prep for activites of daily living with assistance level: (goal downgraded 2/2 slow pt progress) -- Note: goal downgraded 2/2 slow pt progress 11/03/2020 1251 by Volanda Napoleon, OT Flowsheets (Taken 11/03/2020 1251) LTG: PT will perform sit to stand in prep for activites of daily living with assistance level: Maximal Assistance - Patient 25 - 49%   Problem: RH Dressing Goal: LTG Patient will perform lower body dressing w/assist (OT) Description: LTG: Patient will perform lower body dressing with assist, with/without cues in positioning using equipment (OT) 11/03/2020 1627 by Volanda Napoleon, OT Flowsheets (Taken 11/03/2020 1627) LTG: Pt will perform lower body dressing with assistance level of: (goal downgraded 2/2 slow pt progress) -- Note: goal downgraded 2/2 slow pt progress 11/03/2020 1251 by Volanda Napoleon, OT Flowsheets (Taken 11/03/2020 1251) LTG: Pt will perform lower  body dressing with assistance level of: Maximal Assistance - Patient 25 - 49%   Problem: RH Toileting Goal: LTG Patient will perform toileting task (3/3 steps) with assistance level (OT) Description: LTG: Patient will perform toileting task (3/3 steps) with assistance level (OT)  Flowsheets (Taken 11/03/2020 1627) LTG: Pt will perform toileting task (3/3 steps) with assistance level: Moderate Assistance - Patient 50 - 74%   Problem: RH Toilet Transfers Goal: LTG Patient will perform toilet transfers w/assist (OT) Description: LTG: Patient will perform toilet transfers with assist, with/without cues using equipment (OT) 11/03/2020 1627 by Volanda Napoleon, OT Flowsheets (Taken 11/03/2020 1627) LTG: Pt will perform toilet transfers with assistance level of: (goal downgraded 2/2 slow pt progress) -- Note: goal downgraded 2/2 slow pt progress 11/03/2020 1251 by Volanda Napoleon, OT Flowsheets (Taken 11/03/2020 1251) LTG: Pt will perform toilet transfers with assistance level of: Maximal Assistance - Patient 25 - 49%   Problem: RH Tub/Shower Transfers Goal: LTG Patient will perform tub/shower transfers w/assist (OT) Description: LTG: Patient will perform tub/shower transfers with assist, with/without cues using equipment (OT) 11/03/2020 1627 by Volanda Napoleon, OT Flowsheets (Taken 11/03/2020 1627) LTG: Pt will perform tub/shower stall transfers with assistance level of: (goal downgraded 2/2 slow pt progress) -- Note: goal downgraded 2/2 slow pt progress 11/03/2020 1251 by Volanda Napoleon, OT Flowsheets (Taken 11/03/2020 1251) LTG: Pt will perform tub/shower stall transfers with assistance level of: Maximal Assistance - Patient 25 - 49%

## 2020-11-04 ENCOUNTER — Inpatient Hospital Stay (HOSPITAL_COMMUNITY): Payer: PPO

## 2020-11-04 ENCOUNTER — Inpatient Hospital Stay (HOSPITAL_COMMUNITY): Payer: PPO | Admitting: Physical Therapy

## 2020-11-04 ENCOUNTER — Inpatient Hospital Stay (HOSPITAL_COMMUNITY): Payer: PPO | Admitting: Occupational Therapy

## 2020-11-04 LAB — GLUCOSE, CAPILLARY
Glucose-Capillary: 84 mg/dL (ref 70–99)
Glucose-Capillary: 124 mg/dL — ABNORMAL HIGH (ref 70–99)
Glucose-Capillary: 102 mg/dL — ABNORMAL HIGH (ref 70–99)
Glucose-Capillary: 115 mg/dL — ABNORMAL HIGH (ref 70–99)

## 2020-11-04 NOTE — Plan of Care (Signed)
  Problem: Consults Goal: RH STROKE PATIENT EDUCATION Description: See Patient Education module for education specifics  Outcome: Progressing   Problem: RH BOWEL ELIMINATION Goal: RH STG MANAGE BOWEL WITH ASSISTANCE Description: STG Manage Bowel with Assistance. Mod Outcome: Progressing   Problem: RH BLADDER ELIMINATION Goal: RH STG MANAGE BLADDER WITH ASSISTANCE Description: STG Manage Bladder With Assistance Mod Outcome: Progressing   Problem: RH SKIN INTEGRITY Goal: RH STG SKIN FREE OF INFECTION/BREAKDOWN Description: Skin free of breakdown and infection with mod assist Outcome: Progressing   Problem: RH COGNITION-NURSING Goal: RH STG USES MEMORY AIDS/STRATEGIES W/ASSIST TO PROBLEM SOLVE Description: STG Uses Memory Aids/Strategies With Assistance to Problem Solve. mod Outcome: Progressing

## 2020-11-04 NOTE — Progress Notes (Signed)
Physical Therapy Session Note  Patient Details  Name: Fernando [name] MRN: [id number] Date of Birth: [birth year]  Today's Date: 11/04/2020 PT Individual Time: 0925-1005 PT Individual Time Calculation (min): 40 min   Short Term Goals: Week 3:  PT Short Term Goal 1 (Week 3): Pt will be able to perform bed <> chair transfer with mod assist PT Short Term Goal 2 (Week 3): Patient will complete sit <> stand transfers with CGA and LRAD PT Short Term Goal 3 (Week 3): Patient will ambulate 51ft with LRAD, wc follow if needed and MinA  Skilled Therapeutic Interventions/Progress Updates:    Pt received supine in bed asleep but easily awakens and agreeable to therapy session. Reports need to use bathroom. Donned amplifier for improved communication during session. Supine>sitting R EOB, HOB partially elevated and using bedrails, as pt will have these features at home, with min assist for trunk upright. MD in/out for morning assessment. Sit>stand EOB>stedy with CGA for steadying and pt using B UE support on stedy bar. Stedy transfer in/out of bathroom due to urgency. Sit<>stands to/from stedy seat with CGA for safety. Standing in stedy with CGA/min assist while +2 performed total assist LB clothing management and peri-care - continent of bladder and bowels. Sitting in w/c donned LB clothing max assist - while assisting with this pt noted to have pressure sore on posterior surface of L heel - RN notified but unavailable to assess at this time. Sit>stand w/c>RW with heavy min assist for balance - pt slow to come to stand and demos minor posterior lean. Gait training ~12ft, ~82ft using RW with min assist progressed to mod assist for balance at end of 2nd walk due to worsening L posterior lean - requires heavy max assist for AD management throughout as pt pushes it really strongly too far forward - demos wide BOS, very poor motor planning to weight shift onto stance limb to allow opposite limb advancement (more  prominent when stepping L foot forward), very short/shuffle steps, increased trunk/hip flexion throughout, and poor AD management - +2 w/c follow throughout. At end of session pt left tilted back in TIS w/c with needs in reach, seat belt alarm on, and pillows therapeutically positioned for pressure relief.    Therapy Documentation Precautions:  Precautions Precautions: ICD/Pacemaker,Fall Precaution Comments: L sided weakness; very HOH; dysarthria Restrictions Weight Bearing Restrictions: No  Pain:   At end of session states "I'm not hurting right now" and no reports of pain throughout.    Therapy/Group: Individual Therapy  Tawana Scale , PT, DPT, CSRS  11/04/2020, 7:58 AM

## 2020-11-04 NOTE — Progress Notes (Signed)
[place] PHYSICAL MEDICINE & REHABILITATION PROGRESS NOTE   Subjective/Complaints: No complaints this morning Working well with therapy Very hard of hearing Breathing comfortably   ROS: Limited due to cognitive/behavioral    Objective:   No results found. No results for input(s): WBC, HGB, HCT, PLT in the last 72 hours. Recent Labs    11/02/20 0311  NA 137  K 4.6  CL 103  CO2 26  GLUCOSE 101*  BUN 21  CREATININE 1.17  CALCIUM 8.7*    Intake/Output Summary (Last 24 hours) at 11/04/2020 1514 Last data filed at 11/04/2020 0849 Gross per 24 hour  Intake 547 ml  Output --  Net 547 ml     Pressure Injury 10/14/20 Buttocks Left Deep Tissue Pressure Injury - Purple or maroon localized area of discolored intact skin or blood-filled blister due to damage of underlying soft tissue from pressure and/or shear. Purple/red area small fluid filled  (Active)  10/14/20 1700  Location: Buttocks  Location Orientation: Left  Staging: Deep Tissue Pressure Injury - Purple or maroon localized area of discolored intact skin or blood-filled blister due to damage of underlying soft tissue from pressure and/or shear.  Wound Description (Comments): Purple/red area small fluid filled blister noted at center  Present on Admission: Yes    Physical Exam: Vital Signs Blood pressure (!) 90/44, pulse 61, temperature (!) 97.3 F (36.3 C), resp. rate 18, height 5\' 5"  (1.651 m), weight 59.6 kg, SpO2 100 %. Gen: no distress, normal appearing HEENT: oral mucosa pink and moist, NCAT, HOH Cardio: Reg rate Chest: normal effort, normal rate of breathing Abd: soft, non-distended Motor: Limited due to ability to follow commands-RUE: 5/5 proximal distal RLE: 4 -/5 proximal to distal LUE: 4 /5 proximal distal, appears unchanged LLE:  3/5 proximal distal   Assessment/Plan: 1. Functional deficits which require 3+ hours per day of interdisciplinary therapy in a comprehensive inpatient rehab  setting.  Physiatrist is providing close team supervision and 24 hour management of active medical problems listed below.  Physiatrist and rehab team continue to assess barriers to discharge/monitor patient progress toward functional and medical goals  Care Tool:  Bathing  Bathing activity did not occur: Refused Body parts bathed by patient: Right arm,Left arm,Chest,Abdomen,Right upper leg,Left upper leg,Face   Body parts bathed by helper: Buttocks,Right lower leg,Left lower leg Body parts n/a: Front perineal area,Buttocks,Right upper leg,Left upper leg,Right lower leg,Left lower leg,Face (did not attempt LB bathing 2/2 time constraints)   Bathing assist Assist Level: Moderate Assistance - Patient 50 - 74%     Upper Body Dressing/Undressing Upper body dressing   What is the patient wearing?: Pull over shirt    Upper body assist Assist Level: Moderate Assistance - Patient 50 - 74%    Lower Body Dressing/Undressing Lower body dressing      What is the patient wearing?: Incontinence brief,Pants     Lower body assist Assist for lower body dressing: Dependent - Patient 0%     Toileting Toileting    Toileting assist Assist for toileting: Dependent - Patient 0%     Transfers Chair/bed transfer  Transfers assist     Chair/bed transfer assist level: Dependent - mechanical lift Chair/bed transfer assistive device: Mechanical lift (stedy)   Locomotion Ambulation   Ambulation assist   Ambulation activity did not occur: Safety/medical concerns  Assist level: 2 helpers (min A of 1 and +2 for safety) Assistive device: Walker-rolling Max distance: 13ft   Walk 10 feet activity   Assist  Walk 10 feet activity did not occur: Safety/medical concerns  Assist level: 2 helpers (min A of 1 and +2 for safety) Assistive device: Walker-rolling   Walk 50 feet activity   Assist           Walk 150 feet activity   Assist Walk 150 feet activity did not occur:  Safety/medical concerns         Walk 10 feet on uneven surface  activity   Assist Walk 10 feet on uneven surfaces activity did not occur: Safety/medical concerns         Wheelchair     Assist Will patient use wheelchair at discharge?: Yes Type of Wheelchair: Manual    Wheelchair assist level: Minimal Assistance - Patient > 75% Max wheelchair distance: 25    Wheelchair 50 feet with 2 turns activity    Assist        Assist Level: Moderate Assistance - Patient 50 - 74%   Wheelchair 150 feet activity     Assist      Assist Level: Moderate Assistance - Patient 50 - 74%    Medical Problem List and Plan: 1.Left side hemiparesis dysarthria/dysphagiasecondary to right MCA infarction due to right ICA high-grade stenosis status post angioplasty  Continue CIR PT, OT, SLP- ELOS 12/16 2. Antithrombotics: -DVT/anticoagulation:Per neurology stopping Xarelto  -antiplatelet therapy: Aspirin81 mg daily 3. Pain Management:Tylenol as needed.  -CLBP add Kpad, also with lidoderm patch   4. Mood:Provide emotional support -antipsychotic agents: N/A 5. Neuropsych: This patientis notcapable of making decisions on hisown behalf. 6. Skin/Wound Care:Routine skin checks 7. Fluids/Electrolytes/Nutrition:Potassium 3.1 12/4   -supplement 8. Atrial fibrillation/tachybradycardia syndrome/pacemaker. Per Neuro and cardiology will resume Xarelto and monitor Hgb  Continue Toprol25 mg daily  Rate controlled 12/11- continue to monitor TID 9. SCC of the carotid gland. Follow-up outpatient 10. Hypertension. Toprol 25 mg daily.   Cozaar 25 started on 11/27- increased to 50mg  on 12/2   Vitals:   11/04/20 0800 11/04/20 1401  BP: (!) 160/62 (!) 90/44  Pulse: 61 61  Resp:  18  Temp:  (!) 97.3 F (36.3 C)  SpO2:  100%  change clonidine to 0.1 mg TID, improving, no further changes at this time 12/11: BP soft to 90/44, was elevated to 160/62  this morning: continue to monitor TID.   11. Hypothyroidism. Synthroid 12. Bacteremia.Completed Ancef 2 g every 8 hours 10-daycourse 13.  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 Dysphagia # 1 thins liquids. May resume diet, drank H20 for me through a straw without cough or wet voice 15. CKD stage II/III. Baseline 1.30-1.48.   Creatinine 0.98 on 11/27, recheck Monday  16. Acute blood loss anemia with history of GI bleed 2019. Patient transfused 1 unit packed red blood cells 10/12/2020. Continue iron supplement. Protonix 40 mg twice daily.   Hemoglobin 8.9 on 11/28 stable vs 11/22 ok to resume Xarelto as rec per Neuro and cardio Xarelto started 11/29 repeat hgb 9.2 12/4   17. Prediabetes. Hemoglobin A1c 5.7. SSI.   Slightly labile, but relatively controlled     18. Chronic  systolic congestive heart failure-compensated, cards has signed off   Lasix 20 daily per cardiology, appreciate recs Filed Weights   11/02/20 0502 11/03/20 0500 11/04/20 0317  Weight: 61 kg 59.4 kg 59.6 kg    12/11: weight stable: continue to monitor daily.   19,  Hypo K+  recheck BMET Monday  K+ up to 4.6 may reduce KCL to 40meq daily on 12/10 LOS: 21 days  A FACE TO FACE EVALUATION WAS PERFORMED  Clide Deutscher Aadarsh Cozort 11/04/2020, 3:14 PM

## 2020-11-04 NOTE — Progress Notes (Signed)
Speech Language Pathology Daily Session Note  Patient Details  Name: Farron Watrous MRN: [id number] Date of Birth: 1927-07-14  Today's Date: 11/04/2020 SLP Individual Time: 1282-0813 SLP Individual Time Calculation (min): 27 min  Short Term Goals: Week 3: SLP Short Term Goal 1 (Week 3): STG=LTG due to remaining length of stay  Skilled Therapeutic Interventions: Skilled ST services focused on swallow and cognitive skills. SLP facilitated basic problem solving skill in self-feeding task, pt primarly consumed dys 1 textures that were directly in front of him and required mod A verbal cues to alternate to new solids or thin liquids via straw. Pt consumed only dys 1 textures on tray, stating " I cant swallow it" when SLP referenced dys 2 textures on tray. SLP instructed pt to mix dys 1 and dys 2 textures for greater bolus cohesion, but after consumption of mixed bolus pt demonstrated continuous delayed throat clearing. Pt stated " I think its caught." Slp provided liquid wash, which ended pt's throat clearing. SLP downgraded textures to dys 1 textures due to pt's apprehension to swallow dys 2 textures and s/s aspiration, pt agreed with downgrade at this time. Pt demonstrated labor breathing after large sips of thin liquids via straw. Breathing improved with min A verbal cues for small sips. Pt was left in room with call bell within reach and bed alarm set. Recommend to continue skilled ST services.     Pain Pain Assessment Pain Scale: 0-10 Pain Score: 0-No pain Pain Type: Chronic pain Pain Location: Back Pain Orientation: Lower Pain Descriptors / Indicators: Aching Pain Frequency: Occasional Pain Onset: Gradual Patients Stated Pain Goal: 1 Pain Intervention(s): Medication (See eMAR) Multiple Pain Sites: No  Therapy/Group: Individual Therapy  Everet Flagg  [facility] 11/04/2020, 8:11 AM

## 2020-11-04 NOTE — Progress Notes (Signed)
Occupational Therapy Session Note  Patient Details  Name: Fernando [name] MRN: [id number] Date of Birth: Aug 26, 1927  Today's Date: 11/04/2020 OT Individual Time: 1304-1400 OT Individual Time Calculation (min): 56 min    Short Term Goals: Week 1:  OT Short Term Goal 1 (Week 1): Pt will don shirt with mod A OT Short Term Goal 1 - Progress (Week 1): Not met OT Short Term Goal 2 (Week 1): Pt will maintain sitting balance EOB with CGA to complete 1 ADL OT Short Term Goal 2 - Progress (Week 1): Met OT Short Term Goal 3 (Week 1): Pt will complete self feeding with LRAD with min A OT Short Term Goal 3 - Progress (Week 1): Not met Week 2:  OT Short Term Goal 1 (Week 2): Pt will don shirt with mod A OT Short Term Goal 1 - Progress (Week 2): Met OT Short Term Goal 2 (Week 2): Pt will complete self feeding with LRAD with min A OT Short Term Goal 2 - Progress (Week 2): Met OT Short Term Goal 3 (Week 2): Pt will complete sit to stand for progressing of LB selfcare with max assist. OT Short Term Goal 3 - Progress (Week 2): Not met OT Short Term Goal 4 (Week 2): Pt will perform squat pivot transfer to the drop arm commode with max assist. OT Short Term Goal 4 - Progress (Week 2): Not met Week 3:  OT Short Term Goal 1 (Week 3): Pt will complete sit to stand for progressing of LB selfcare with max assist. OT Short Term Goal 1 - Progress (Week 3): Not met OT Short Term Goal 2 (Week 3): Pt will perform squat pivot transfer to the drop arm commode with max assist. OT Short Term Goal 2 - Progress (Week 3): Not met OT Short Term Goal 3 (Week 3): Pt will thread 1 lower extremity when donning pants with mod A. OT Short Term Goal 3 - Progress (Week 3): Not met OT Short Term Goal 4 (Week 3): Pt will don shirt with supervision. OT Short Term Goal 4 - Progress (Week 3): Met Week 4:  OT Short Term Goal 1 (Week 4): Pt will complete sit to stand for progressing of LB selfcare with max assist. OT Short Term Goal 2  (Week 4): Pt will perform squat pivot transfer to the drop arm commode with max assist. OT Short Term Goal 3 (Week 4): Pt will thread 1 lower extremity when donning pants with mod A.  Skilled Therapeutic Interventions/Progress Updates:    Pt received in wc with son in the room.  Pt did stated that he needed to toilet.  Because this writer was not familiar with patient and understood from other therapists that his level of function varies, used the stedy lift to transport pt to toilet.  Pt was able to rise to stand and sit very easily (almost with S only) so therefore used RW for sit to stands from toilet and to transfer out of bathroom.  Pt was continent and was then able to have a bowel movement and urinate on toilet.  He did attempt wiping with R hand but to avoid pt from getting BM on his hand, therapist then assisted. Pt was able to stand up from toilet 3x to RW all with just light min A. His standing tolerance was about 1 -2 min at a time.  He needed to stand several times for cleansing, changing pad on bottom, applying barrier cream, and adjusting clothing.  Pt  then was able to ambulate from toilet to wc with min to mod A as he tended to push RW far in front of him and needed A to keep walker closer to him.  From wc worked on LUE a/arom with self ROM,  Resisted pushing and pulling, grasping and then L hand [place] with coloring in small circles.    Pt participated well today.  Resting in tilt in space wc with belt alarm on and all needs met and pt's son was with him.   Therapy Documentation Precautions:  Precautions Precautions: ICD/Pacemaker,Fall Precaution Comments: L sided weakness; very HOH; dysarthria Restrictions Weight Bearing Restrictions: No   Vital Signs: Therapy Vitals Pulse Rate: 61 BP: (!) 160/62 Pain: Pain Assessment Pain Scale: 0-10 Pain Score: 0-No pain Pain Type: Chronic pain Pain Location: Back Pain Orientation: Lower Pain Descriptors / Indicators: Aching Pain  Frequency: Occasional Pain Onset: Gradual Patients Stated Pain Goal: 1 Pain Intervention(s): Medication (See eMAR) Multiple Pain Sites: No ADL: ADL Eating: Maximal assistance Where Assessed-Eating: Bed level Grooming: Maximal assistance Where Assessed-Grooming: Bed level Upper Body Bathing: Not assessed Lower Body Bathing: Not assessed Upper Body Dressing: Maximal assistance Where Assessed-Upper Body Dressing: Edge of bed Lower Body Dressing: Dependent Where Assessed-Lower Body Dressing: Edge of bed Toileting: Unable to assess Toilet Transfer: Unable to assess Toilet Transfer Method: Unable to assess Tub/Shower Transfer: Unable to assess   Therapy/Group: Individual Therapy  [place] 11/04/2020, 8:48 AM

## 2020-11-05 ENCOUNTER — Inpatient Hospital Stay (HOSPITAL_COMMUNITY): Payer: PPO

## 2020-11-05 ENCOUNTER — Inpatient Hospital Stay (HOSPITAL_COMMUNITY): Payer: PPO | Admitting: Physical Therapy

## 2020-11-05 ENCOUNTER — Inpatient Hospital Stay (HOSPITAL_COMMUNITY): Payer: PPO | Admitting: Speech Pathology

## 2020-11-05 LAB — GLUCOSE, CAPILLARY
Glucose-Capillary: 89 mg/dL (ref 70–99)
Glucose-Capillary: 90 mg/dL (ref 70–99)
Glucose-Capillary: 119 mg/dL — ABNORMAL HIGH (ref 70–99)
Glucose-Capillary: 108 mg/dL — ABNORMAL HIGH (ref 70–99)

## 2020-11-05 NOTE — Progress Notes (Signed)
Speech Language Pathology Daily Session Note  Patient Details  Name: Fernando [name] MRN: [id number] Date of Birth: [birth year]  Today's Date: 11/05/2020 SLP Individual Time: 1345-1425 SLP Individual Time Calculation (min): 40 min  Short Term Goals: Week 3: SLP Short Term Goal 1 (Week 3): STG=LTG due to remaining length of stay  Skilled Therapeutic Interventions:  Pt was seen for skilled ST targeting goals for speech and dysphagia.  Pt was sleeping upon arrival but awakened easily to voice.  He stated "Today was a rough day," due to not sleeping well last night.  Despite lethargy, pt was willing to participate in treatment.  During conversations, pt needed min-mod assist verbal cues to increase vocal intensity and achieve intelligibility at the sentence level.  Therapist also facilitated the session with a functional snack of dys 1 textures and thin liquids to monitor toleration of currently prescribed diet.  Pt consumed snack with supervision cues for use of swallowing precautions and no overt s/s of aspiration.  Pt was left in bed with bed alarm set and call bell within reach.  Continue per current plan of care.    Pain Pain Assessment Pain Scale: 0-10 Pain Score: 0-No pain  Therapy/Group: Individual Therapy  Ladeana Laplant, Selinda Orion 11/05/2020, 3:56 PM

## 2020-11-05 NOTE — Plan of Care (Signed)
°  Problem: Consults Goal: RH STROKE PATIENT EDUCATION Description: See Patient Education module for education specifics  Outcome: Progressing   Problem: RH BOWEL ELIMINATION Goal: RH STG MANAGE BOWEL WITH ASSISTANCE Description: STG Manage Bowel with Assistance. Mod Outcome: Progressing   Problem: RH BLADDER ELIMINATION Goal: RH STG MANAGE BLADDER WITH ASSISTANCE Description: STG Manage Bladder With Assistance Mod Outcome: Progressing   Problem: RH SKIN INTEGRITY Goal: RH STG SKIN FREE OF INFECTION/BREAKDOWN Description: Skin free of breakdown and infection with mod assist Outcome: Progressing   Problem: RH COGNITION-NURSING Goal: RH STG USES MEMORY AIDS/STRATEGIES W/ASSIST TO PROBLEM SOLVE Description: STG Uses Memory Aids/Strategies With Assistance to Problem Solve. mod Outcome: Progressing

## 2020-11-05 NOTE — Progress Notes (Signed)
Occupational Therapy Session Note  Patient Details  Name: Fernando [name] MRN: [id number] Date of Birth: [birth year]  Today's Date: 11/05/2020 OT Individual Time: 5465-0354 OT Individual Time Calculation (min): 58 min    Short Term Goals: Week 1:  OT Short Term Goal 1 (Week 1): Pt will don shirt with mod A OT Short Term Goal 1 - Progress (Week 1): Not met OT Short Term Goal 2 (Week 1): Pt will maintain sitting balance EOB with CGA to complete 1 ADL OT Short Term Goal 2 - Progress (Week 1): Met OT Short Term Goal 3 (Week 1): Pt will complete self feeding with LRAD with min A OT Short Term Goal 3 - Progress (Week 1): Not met  Skilled Therapeutic Interventions/Progress Updates:    1;1. Pt received in bed agreeable to OT. Pt reporting no pain and need to toilet. D/t morning time and arying levels of A needed. OT elects to use stedy for toileting. Pt able to stand with CGA and VC for foot poacement on stedy. Pt able to have small B&B movement on toilet. Pt completes grooming at sink with MOD A for thorough shaving with electric razor and S for combing hair. Pt requires MAX A for LB dressing components but MIN A sit to stand at sink to manage pants past hips. Pt vitals checked and WFL. Pt completes massed practice of transfers with VC for feet remaining inside RW and reach back. Pt overall completes 6x with min-CGA! Exited session with pt seated in TIS, exit alarm on and call light in reach   Therapy Documentation Precautions:  Precautions Precautions: ICD/Pacemaker,Fall Precaution Comments: L sided weakness; very HOH; dysarthria Restrictions Weight Bearing Restrictions: No General:   Vital Signs: Therapy Vitals Temp: 98 F (36.7 C) Temp Source: Oral Pulse Rate: 63 Resp: 17 BP: (!) 144/68 Patient Position (if appropriate): Lying Oxygen Therapy SpO2: 94 % O2 Device: Room Air Pain:   ADL: ADL Eating: Maximal assistance Where Assessed-Eating: Bed level Grooming: Maximal  assistance Where Assessed-Grooming: Bed level Upper Body Bathing: Not assessed Lower Body Bathing: Not assessed Upper Body Dressing: Maximal assistance Where Assessed-Upper Body Dressing: Edge of bed Lower Body Dressing: Dependent Where Assessed-Lower Body Dressing: Edge of bed Toileting: Unable to assess Toilet Transfer: Unable to assess Toilet Transfer Method: Unable to assess Tub/Shower Transfer: Unable to assess Vision   Perception    Praxis   Exercises:   Other Treatments:     Therapy/Group: Individual Therapy  Tonny Branch 11/05/2020, 6:53 AM

## 2020-11-05 NOTE — Progress Notes (Signed)
[place] PHYSICAL MEDICINE & REHABILITATION PROGRESS NOTE   Subjective/Complaints: No complaints this morning Working well with therapy.    ROS: Limited due to cognitive/behavioral    Objective:   No results found. No results for input(s): WBC, HGB, HCT, PLT in the last 72 hours. No results for input(s): NA, K, CL, CO2, GLUCOSE, BUN, CREATININE, CALCIUM in the last 72 hours.  Intake/Output Summary (Last 24 hours) at 11/05/2020 1331 Last data filed at 11/05/2020 1312 Gross per 24 hour  Intake 658 ml  Output --  Net 658 ml     Pressure Injury 10/14/20 Buttocks Left Deep Tissue Pressure Injury - Purple or maroon localized area of discolored intact skin or blood-filled blister due to damage of underlying soft tissue from pressure and/or shear. Purple/red area small fluid filled  (Active)  10/14/20 1700  Location: Buttocks  Location Orientation: Left  Staging: Deep Tissue Pressure Injury - Purple or maroon localized area of discolored intact skin or blood-filled blister due to damage of underlying soft tissue from pressure and/or shear.  Wound Description (Comments): Purple/red area small fluid filled blister noted at center  Present on Admission: Yes     Pressure Injury 11/05/20 Heel Left Deep Tissue Pressure Injury - Purple or maroon localized area of discolored intact skin or blood-filled blister due to damage of underlying soft tissue from pressure and/or shear. Black hard area to mid left heel with b (Active)  11/05/20 1322  Location: Heel  Location Orientation: Left  Staging: Deep Tissue Pressure Injury - Purple or maroon localized area of discolored intact skin or blood-filled blister due to damage of underlying soft tissue from pressure and/or shear.  Wound Description (Comments): Black hard area to mid left heel with blanchable skin surrounding  Present on Admission: No    Physical Exam: Vital Signs Blood pressure (!) 109/54, pulse 67, temperature 98 F (36.7 C),  temperature source Oral, resp. rate 17, height 5\' 5"  (1.651 m), weight 59.4 kg, SpO2 94 %. Gen: no distress, normal appearing HEENT: oral mucosa pink and moist, NCAT Cardio: Reg rate Chest: normal effort, normal rate of breathing Abd: soft, non-distended Motor: Limited due to ability to follow commands-RUE: 5/5 proximal distal RLE: 4 -/5 proximal to distal LUE: 4 /5 proximal distal, appears unchanged LLE:  3/5 proximal distal   Assessment/Plan: 1. Functional deficits which require 3+ hours per day of interdisciplinary therapy in a comprehensive inpatient rehab setting.  Physiatrist is providing close team supervision and 24 hour management of active medical problems listed below.  Physiatrist and rehab team continue to assess barriers to discharge/monitor patient progress toward functional and medical goals  Care Tool:  Bathing  Bathing activity did not occur: Refused Body parts bathed by patient: Right arm,Left arm,Chest,Abdomen,Right upper leg,Left upper leg,Face   Body parts bathed by helper: Buttocks,Right lower leg,Left lower leg Body parts n/a: Front perineal area,Buttocks,Right upper leg,Left upper leg,Right lower leg,Left lower leg,Face (did not attempt LB bathing 2/2 time constraints)   Bathing assist Assist Level: Moderate Assistance - Patient 50 - 74%     Upper Body Dressing/Undressing Upper body dressing   What is the patient wearing?: Pull over shirt    Upper body assist Assist Level: Moderate Assistance - Patient 50 - 74%    Lower Body Dressing/Undressing Lower body dressing      What is the patient wearing?: Incontinence brief,Pants     Lower body assist Assist for lower body dressing: Dependent - Patient 0%     Toileting Toileting  Toileting assist Assist for toileting: Dependent - Patient 0%     Transfers Chair/bed transfer  Transfers assist     Chair/bed transfer assist level: Moderate Assistance - Patient 50 - 74% Chair/bed transfer  assistive device: Other (stedy)   Locomotion Ambulation   Ambulation assist   Ambulation activity did not occur: Safety/medical concerns  Assist level: Moderate Assistance - Patient 50 - 74% Assistive device: Walker-rolling Max distance: 10   Walk 10 feet activity   Assist  Walk 10 feet activity did not occur: Safety/medical concerns  Assist level: Moderate Assistance - Patient - 50 - 74% Assistive device: Walker-rolling   Walk 50 feet activity   Assist           Walk 150 feet activity   Assist Walk 150 feet activity did not occur: Safety/medical concerns         Walk 10 feet on uneven surface  activity   Assist Walk 10 feet on uneven surfaces activity did not occur: Safety/medical concerns         Wheelchair     Assist Will patient use wheelchair at discharge?: Yes Type of Wheelchair: Manual    Wheelchair assist level: Minimal Assistance - Patient > 75% Max wheelchair distance: 25    Wheelchair 50 feet with 2 turns activity    Assist        Assist Level: Moderate Assistance - Patient 50 - 74%   Wheelchair 150 feet activity     Assist      Assist Level: Moderate Assistance - Patient 50 - 74%    Medical Problem List and Plan: 1.Left side hemiparesis dysarthria/dysphagiasecondary to right MCA infarction due to right ICA high-grade stenosis status post angioplasty  Continue CIR PT, OT, SLP- ELOS 12/16 2. Antithrombotics: -DVT/anticoagulation:Per neurology stopping Xarelto  -antiplatelet therapy: Aspirin81 mg daily 3. Pain Management:Tylenol as needed.  -CLBP add Kpad, also with lidoderm patch, pain appears to be well controlled, continue current regimen.    4. Mood:Provide emotional support -antipsychotic agents: N/A 5. Neuropsych: This patientis notcapable of making decisions on hisown behalf. 6. Skin/Wound Care:Routine skin checks 7. Fluids/Electrolytes/Nutrition:Potassium 3.1  12/4   -supplement 8. Atrial fibrillation/tachybradycardia syndrome/pacemaker. Per Neuro and cardiology will resume Xarelto and monitor Hgb  Continue Toprol25 mg daily  Rate controlled 12/12- continue to monitor TID 9. SCC of the carotid gland. Follow-up outpatient 10. Hypertension. Toprol 25 mg daily.   Cozaar 25 started on 11/27- increased to 50mg  on 12/2   Vitals:   11/05/20 0754 11/05/20 0839  BP: (!) 162/58 (!) 109/54  Pulse: 66 67  Resp:    Temp:    SpO2:    change clonidine to 0.1 mg TID, improving, no further changes at this time 12/12: BP continues to be labile: continue to monitor TID  11. Hypothyroidism. Synthroid 12. Bacteremia.Completed Ancef 2 g every 8 hours 10-daycourse 13.  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 Dysphagia # 1 thins liquids. May resume diet, drank H20 for me through a straw without cough or wet voice 15. CKD stage II/III. Baseline 1.30-1.48.   Creatinine 0.98 on 11/27, recheck Monday  16. Acute blood loss anemia with history of GI bleed 2019. Patient transfused 1 unit packed red blood cells 10/12/2020. Continue iron supplement. Protonix 40 mg twice daily.   Hemoglobin 8.9 on 11/28 stable vs 11/22 ok to resume Xarelto as rec per Neuro and cardio Xarelto started 11/29 repeat hgb 9.2 12/4   17. Prediabetes. Hemoglobin A1c 5.7. SSI.   Slightly labile,  but relatively controlled     18. Chronic  systolic congestive heart failure-compensated, cards has signed off   Lasix 20 daily per cardiology, appreciate recs Filed Weights   11/03/20 0500 11/04/20 0317 11/05/20 0405  Weight: 59.4 kg 59.6 kg 59.4 kg    12/11: weight stable: continue to monitor daily.   19,  Hypo K+  recheck BMET Monday  K+ up to 4.6 may reduce KCL to 61meq daily on 12/10 LOS: 22 days A FACE TO [place] 11/05/2020, 1:31 PM

## 2020-11-05 NOTE — Progress Notes (Signed)
Physical Therapy Session Note  Patient Details  Name: Fernando [name] MRN: [id number] Date of Birth: [birth year]  Today's Date: 11/05/2020 PT Individual Time: 1100-1156 PT Individual Time Calculation (min): 56 min   Short Term Goals: Week 1:  PT Short Term Goal 1 (Week 1): Pt will be able to perform bed mobility with mod assist overall PT Short Term Goal 1 - Progress (Week 1): Met PT Short Term Goal 2 (Week 1): Pt will be able to perform bed <> chair transfer with mod assist PT Short Term Goal 2 - Progress (Week 1): Progressing toward goal PT Short Term Goal 3 (Week 1): Pt will be able to perform sit <> stands with mod +2 PT Short Term Goal 3 - Progress (Week 1): Met  Skilled Therapeutic Interventions/Progress Updates:  Pt was seen bedside in the am, sitting up in w/c with daughter at bedside. Pt transported to rehab gym. Pt performed multiple sit to stand transfers with min A and verbal cues. Pt performed multiple stand pivot transfers with rolling walker and mod A with verbal cues. Pt ambulated 10 feet x 2 with rolling walker and mod A with verbal cues. Pt transferred w/c to edge of bed with rolling walker and mod A. Pt transferred edge of bed to supine with mod A and verbal cues. Pt left sitting in R sidelying with pillows under B heals and all needs within reach and bed alarm on.   Therapy Documentation Precautions:  Precautions Precautions: ICD/Pacemaker,Fall Precaution Comments: L sided weakness; very HOH; dysarthria Restrictions Weight Bearing Restrictions: No General:   Pain: No c/o pain.   Therapy/Group: Individual Therapy  Dub Amis 11/05/2020, 12:28 PM

## 2020-11-06 ENCOUNTER — Inpatient Hospital Stay (HOSPITAL_COMMUNITY): Payer: PPO | Admitting: Speech Pathology

## 2020-11-06 ENCOUNTER — Inpatient Hospital Stay (HOSPITAL_COMMUNITY): Payer: PPO

## 2020-11-06 ENCOUNTER — Inpatient Hospital Stay (HOSPITAL_COMMUNITY): Payer: PPO | Admitting: Occupational Therapy

## 2020-11-06 LAB — GLUCOSE, CAPILLARY
Glucose-Capillary: 89 mg/dL (ref 70–99)
Glucose-Capillary: 46 mg/dL — ABNORMAL LOW (ref 70–99)
Glucose-Capillary: 111 mg/dL — ABNORMAL HIGH (ref 70–99)
Glucose-Capillary: 105 mg/dL — ABNORMAL HIGH (ref 70–99)
Glucose-Capillary: 100 mg/dL — ABNORMAL HIGH (ref 70–99)

## 2020-11-06 LAB — BASIC METABOLIC PANEL
Anion gap: 9 (ref 5–15)
BUN: 20 mg/dL (ref 8–23)
CO2: 25 mmol/L (ref 22–32)
Calcium: 8.6 mg/dL — ABNORMAL LOW (ref 8.9–10.3)
Chloride: 102 mmol/L (ref 98–111)
Creatinine, Ser: 1.09 mg/dL (ref 0.61–1.24)
GFR, Estimated: >60 mL/min (ref >60)
Glucose, Bld: 99 mg/dL (ref 70–99)
Potassium: 4.2 mmol/L (ref 3.5–5.1)
Sodium: 136 mmol/L (ref 135–145)

## 2020-11-06 NOTE — Plan of Care (Signed)
°  Problem: RH Swallowing Goal: LTG Patient will consume least restrictive diet using compensatory strategies with assistance (SLP) Description: LTG:  Patient will consume least restrictive diet using compensatory strategies with assistance (SLP) Flowsheets (Taken 11/06/2020 1216) LTG: Pt Patient will consume least restrictive diet using compensatory strategies with assistance of (SLP): Minimal Assistance - Patient > 75% Note: Downgraded due to slower than anticipated progress Goal: LTG Patient will participate in dysphagia therapy to increase swallow function with assistance (SLP) Description: LTG:  Patient will participate in dysphagia therapy to increase swallow function with assistance (SLP) Flowsheets (Taken 11/06/2020 1216) LTG: Pt will participate in dysphagia therapy to increase swallow function with assistance of (SLP): Minimal Assistance - Patient > 75% Note: Downgraded due to slower than anticipated progress   Problem: RH Problem Solving Goal: LTG Patient will demonstrate problem solving for (SLP) Description: LTG:  Patient will demonstrate problem solving for basic/complex daily situations with cues  (SLP) Flowsheets (Taken 11/06/2020 1216) LTG Patient will demonstrate problem solving for: Moderate Assistance - Patient 50 - 74% Note: Downgraded due to slower than anticipated progress   Problem: RH Attention Goal: LTG Patient will demonstrate this level of attention during functional activites (SLP) Description: LTG:  Patient will will demonstrate this level of attention during functional activites (SLP) Flowsheets (Taken 11/06/2020 1216) Patient will demonstrate during cognitive/linguistic activities the attention type of: Sustained Number of minutes patient will demonstrate attention during cognitive/linguistic activities: 15 Note: Downgraded due to slower than anticipated progress   Problem: RH Awareness Goal: LTG: Patient will demonstrate awareness during functional  activites type of (SLP) Description: LTG: Patient will demonstrate awareness during functional activites type of (SLP) Flowsheets (Taken 11/06/2020 1216) LTG: Patient will demonstrate awareness during cognitive/linguistic activities with assistance of (SLP): Moderate Assistance - Patient 50 - 74% Note: Downgraded due to slower than anticipated progress

## 2020-11-06 NOTE — Progress Notes (Signed)
Patient ID: Fernando [name], male   DOB: 10-27-1927, 86 y.o.   MRN: [id number]   [facility] education set tomorrow [address] [place]

## 2020-11-06 NOTE — Progress Notes (Signed)
Patient ID: Fernando [name], male   DOB: [birth year], 86 y.o.   MRN: [id number]   Patient declined by [facility] due to insurance. SW will continue to reach out to other companies.   Darrol Angel [phone]

## 2020-11-06 NOTE — Progress Notes (Signed)
Patient ID: Fernando [name], male   DOB: [birth year], 86 y.o.   MRN: [id number]   Patient accepted by [facility] for continuing therapies at home.  [place], [place]

## 2020-11-06 NOTE — Progress Notes (Signed)
Physical Therapy Session Note  Patient Details  Name: Fernando [name] MRN: [id number] Date of Birth: 10-23-1927  Today's Date: 11/06/2020 PT Individual Time: 1100-1200 PT Individual Time Calculation (min): 60 min   Short Term Goals: Week 3:  PT Short Term Goal 1 (Week 3): Pt will be able to perform bed <> chair transfer with mod assist PT Short Term Goal 2 (Week 3): Patient will complete sit <> stand transfers with CGA and LRAD PT Short Term Goal 3 (Week 3): Patient will ambulate 85ft with LRAD, wc follow if needed and MinA  Skilled Therapeutic Interventions/Progress Updates:    Patient received sitting up in wc, agreeable to PT. He reports 5/10 low back pain, premedicated. PT providing rest breaks, repositioning and distractions to assist with pain management. PT propelling patient in wc to therapy gym for time management and energy conservation. Patient able to ambulate the following distances with RW and MinA with wc follow for safety: 55ft, 59ft, 30ft, 47ft. Patient maintains forward flexed posture, premorbid, with decreased B step length, decreased B foot clearance, WBOS. Patient does fluctuate between posterior weight shift and neutral weight shift depending on fatigue. Patient able to transfer to mat with RW and ModA. Patient attempting to sit before being squared up to mat resulting in increased assist from PT to ensure safety. Patient completing sit <> stand from therapy mat at standard chair-height with MinA and verbal cues for hand placement. Patient able to remain standing ~1 min before needing seated rest break. Patient transferring back to wc with RW and ModA. Patient returning to room in wc, seatbelt alarm on, call light within reach. Grandson at bedside reporting to PT that patients family would like a wc consult- PT arranging for wc consult prior to dc.   Therapy Documentation Precautions:  Precautions Precautions: ICD/Pacemaker,Fall Precaution Comments: L sided weakness; very  HOH; dysarthria Restrictions Weight Bearing Restrictions: No    Therapy/Group: Individual Therapy  [name], PT, DPT, CBIS  11/06/2020, 7:45 AM

## 2020-11-06 NOTE — Progress Notes (Signed)
Speech Language Pathology Daily Session Note  Patient Details  Name: Fernando [name] MRN: [id number] Date of Birth: [birth year]  Today's Date: 11/06/2020 SLP Individual Time: 1345-1425 SLP Individual Time Calculation (min): 40 min  Short Term Goals: Week 3: SLP Short Term Goal 1 (Week 3): STG=LTG due to remaining length of stay  Skilled Therapeutic Interventions: Pt was seen for skilled ST targeting cognitive-linguistic skills. Pt required overall Min A verbal and visual cues for problem solving use of calendar to orient to accurate date. He did use aids to recall d/c date and independently recalled 1 activity from earlier PT session. Pt required a significant amount of extra time and Mod A verbal and visual cues for problem solving and error awareness during a basic PEG board design task. He selectively attended to tasks with overall Supervision A verbal cue for redirection. Discussed plans for family training tomorrow and memory notebook updated at end of session. Pt left laying semi-reclined in bed with alarm set and needs within reach. Continue per current plan of care.      Pain Pain Assessment Pain Scale: Faces Faces Pain Scale: No hurt  Therapy/Group: Individual Therapy  [name] 11/06/2020, 7:28 AM

## 2020-11-06 NOTE — Progress Notes (Signed)
[place] PHYSICAL MEDICINE & REHABILITATION PROGRESS NOTE   Subjective/Complaints:  Eating breakfast without cough, was downgraded to D1 last week  ROS: Limited due to cognitive/behavioral    Objective:   No results found. No results for input(s): WBC, HGB, HCT, PLT in the last 72 hours. Recent Labs    11/06/20 0539  NA 136  K 4.2  CL 102  CO2 25  GLUCOSE 99  BUN 20  CREATININE 1.09  CALCIUM 8.6*    Intake/Output Summary (Last 24 hours) at 11/06/2020 0747 Last data filed at 11/06/2020 0430 Gross per 24 hour  Intake 668 ml  Output --  Net 668 ml     Pressure Injury 10/14/20 Buttocks Left Deep Tissue Pressure Injury - Purple or maroon localized area of discolored intact skin or blood-filled blister due to damage of underlying soft tissue from pressure and/or shear. Purple/red area small fluid filled  (Active)  10/14/20 1700  Location: Buttocks  Location Orientation: Left  Staging: Deep Tissue Pressure Injury - Purple or maroon localized area of discolored intact skin or blood-filled blister due to damage of underlying soft tissue from pressure and/or shear.  Wound Description (Comments): Purple/red area small fluid filled blister noted at center  Present on Admission: Yes     Pressure Injury 11/05/20 Heel Left Deep Tissue Pressure Injury - Purple or maroon localized area of discolored intact skin or blood-filled blister due to damage of underlying soft tissue from pressure and/or shear. Black hard area to mid left heel with b (Active)  11/05/20 1322  Location: Heel  Location Orientation: Left  Staging: Deep Tissue Pressure Injury - Purple or maroon localized area of discolored intact skin or blood-filled blister due to damage of underlying soft tissue from pressure and/or shear.  Wound Description (Comments): Black hard area to mid left heel with blanchable skin surrounding  Present on Admission: No    Physical Exam: Vital Signs Blood pressure 128/67, pulse 64,  temperature 97.7 F (36.5 C), resp. rate 20, height 5\' 5"  (1.651 m), weight 58.5 kg, SpO2 98 %.  General: No acute distress Mood and affect are appropriate Heart: Regular rate and rhythm no rubs murmurs or extra sounds Lungs: Clear to auscultation, breathing unlabored, no rales or wheezes Abdomen: Positive bowel sounds, soft nontender to palpation, nondistended Extremities: No clubbing, cyanosis, or edema   Motor: Limited due to ability to follow commands-RUE: 5/5 proximal distal RLE: 4 -/5 proximal to distal LUE: 4 /5 proximal distal, appears unchanged LLE:  3/5 proximal distal   Assessment/Plan: 1. Functional deficits which require 3+ hours per day of interdisciplinary therapy in a comprehensive inpatient rehab setting.  Physiatrist is providing close team supervision and 24 hour management of active medical problems listed below.  Physiatrist and rehab team continue to assess barriers to discharge/monitor patient progress toward functional and medical goals  Care Tool:  Bathing  Bathing activity did not occur: Refused Body parts bathed by patient: Right arm,Left arm,Chest,Abdomen,Right upper leg,Left upper leg,Face   Body parts bathed by helper: Buttocks,Right lower leg,Left lower leg Body parts n/a: Front perineal area,Buttocks,Right upper leg,Left upper leg,Right lower leg,Left lower leg,Face (did not attempt LB bathing 2/2 time constraints)   Bathing assist Assist Level: Moderate Assistance - Patient 50 - 74%     Upper Body Dressing/Undressing Upper body dressing   What is the patient wearing?: Pull over shirt    Upper body assist Assist Level: Moderate Assistance - Patient 50 - 74%    Lower Body Dressing/Undressing Lower  body dressing      What is the patient wearing?: Incontinence brief,Pants     Lower body assist Assist for lower body dressing: Dependent - Patient 0%     Toileting Toileting    Toileting assist Assist for toileting: Dependent - Patient  0%     Transfers Chair/bed transfer  Transfers assist     Chair/bed transfer assist level: Moderate Assistance - Patient 50 - 74% Chair/bed transfer assistive device: Other (stedy)   Locomotion Ambulation   Ambulation assist   Ambulation activity did not occur: Safety/medical concerns  Assist level: Moderate Assistance - Patient 50 - 74% Assistive device: Walker-rolling Max distance: 10   Walk 10 feet activity   Assist  Walk 10 feet activity did not occur: Safety/medical concerns  Assist level: Moderate Assistance - Patient - 50 - 74% Assistive device: Walker-rolling   Walk 50 feet activity   Assist           Walk 150 feet activity   Assist Walk 150 feet activity did not occur: Safety/medical concerns         Walk 10 feet on uneven surface  activity   Assist Walk 10 feet on uneven surfaces activity did not occur: Safety/medical concerns         Wheelchair     Assist Will patient use wheelchair at discharge?: Yes Type of Wheelchair: Manual    Wheelchair assist level: Minimal Assistance - Patient > 75% Max wheelchair distance: 25    Wheelchair 50 feet with 2 turns activity    Assist        Assist Level: Moderate Assistance - Patient 50 - 74%   Wheelchair 150 feet activity     Assist      Assist Level: Moderate Assistance - Patient 50 - 74%    Medical Problem List and Plan: 1.Left side hemiparesis dysarthria/dysphagiasecondary to right MCA infarction due to right ICA high-grade stenosis status post angioplasty  Continue CIR PT, OT, SLP- ELOS 12/16 2. Antithrombotics: -DVT/anticoagulation:Per neurology stopping Xarelto  -antiplatelet therapy: Aspirin81 mg daily 3. Pain Management:Tylenol as needed.  -CLBP add Kpad, also with lidoderm patch, pain appears to be well controlled, continue current regimen.    4. Mood:Provide emotional support -antipsychotic agents: N/A 5. Neuropsych: This  patientis notcapable of making decisions on hisown behalf. 6. Skin/Wound Care:Routine skin checks 7. Fluids/Electrolytes/Nutrition:BUN/Creat now normal    -supplement 8. Atrial fibrillation/tachybradycardia syndrome/pacemaker. Per Neuro and cardiology will resume Xarelto and monitor Hgb  Continue Toprol25 mg daily  Rate controlled 12/12- continue to monitor TID 9. SCC of the carotid gland. Follow-up outpatient 10. Hypertension. Toprol 25 mg daily.   Cozaar 25 started on 11/27- increased to 50mg  on 12/2   Vitals:   11/05/20 1923 11/06/20 0436  BP: (!) 117/49 128/67  Pulse: 63 64  Resp: 16 20  Temp: 98.8 F (37.1 C) 97.7 F (36.5 C)  SpO2: 100% 98%  change clonidine to 0.1 mg TID, improving, no further changes at this time Controlled 12/13  11. Hypothyroidism. Synthroid 12. Bacteremia.Completed Ancef 2 g every 8 hours 10-daycourse 13.  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 Dysphagia # 1 thins liquids. May resume diet, drank H20 for me through a straw without cough or wet voice 15. CKD stage II/III. Baseline 1.30-1.48.   Creat now normal , GFR >60 16. Acute blood loss anemia with history of GI bleed 2019. Patient transfused 1 unit packed red blood cells 10/12/2020. Continue iron supplement. Protonix 40 mg twice daily.  Hemoglobin 8.9 on 11/28 stable vs 11/22 ok to resume Xarelto as rec per Neuro and cardio Xarelto started 11/29 repeat hgb 9.2 12/4   17. Prediabetes. Hemoglobin A1c 5.7. SSI.   Slightly labile, but relatively controlled     18. Chronic  systolic congestive heart failure-compensated, cards has signed off   Lasix 20 daily per cardiology, appreciate recs Filed Weights   11/04/20 0317 11/05/20 0405 11/06/20 0442  Weight: 59.6 kg 59.4 kg 58.5 kg    12/11: weight stable: continue to monitor daily.   19,  Hypo K+ normal at 4.2 on 12/13 cont current dose   LOS: 23 days A FACE TO FACE EVALUATION WAS PERFORMED  Charlett Blake 11/06/2020, 7:47 AM

## 2020-11-06 NOTE — Progress Notes (Signed)
Occupational Therapy Session Note  Patient Details  Name: Fernando [name] MRN: [id number] Date of Birth: 1927/07/18  Today's Date: 11/06/2020 OT Individual Time: 6606-0045 OT Individual Time Calculation (min): 42 min    Short Term Goals: Week 4:  OT Short Term Goal 1 (Week 4): Pt will complete sit to stand for progressing of LB selfcare with max assist. OT Short Term Goal 2 (Week 4): Pt will perform squat pivot transfer to the drop arm commode with max assist. OT Short Term Goal 3 (Week 4): Pt will thread 1 lower extremity when donning pants with mod A.  Skilled Therapeutic Interventions/Progress Updates:    Pt received semi-reclined in bed with bed alarm engaged and NTs present. Side lying towards R with min A for L hand placement on bed rail. Max A for side lying > sitting EOB to lift trunk and move BLE off bed. Used stedy for toilet transfer 2/2 time constraints. Max A for LB clothing management. Pt able to assist some with pulling L side of pants down. Pt continent of B/B. Standing in stedy, completed posterior peri care with mod A for thoroughness. Stedy transfer to EOB. He completed min assist transfer to the [place] wheelchair with use of the RW. Stood at sink to wash hands and rinse dentures with min A to stand. Able to tolerate ~3 min of standing before req seated rest break. Sit<>stand once more to improve standing ADL tolerance for ~2 min while pt washed sink. Donned TEDs with total A. Donned bilat gripper socks with max A to thread bilat socks over toes. Left in TIS with safety belt placed, adapted call bell in reach, and all immediate needs met. Overall demonstrated improved func transfers, posterior pericare, and activity tolerance. Removed sacral padding as it was peeling off. RN made aware to replace.  Therapy Documentation Precautions:  Precautions Precautions: ICD/Pacemaker,Fall Precaution Comments: L sided weakness; very HOH; dysarthria Restrictions Weight Bearing Restrictions:  No   Pain: Pain Assessment Pain Scale: Faces Faces Pain Scale: Hurts a little bit Pain Type: Chronic pain Pain Location: Back Pain Intervention(s): Repositioned ADL: See Care Tool for more details.   Therapy/Group: Individual Therapy  Volanda Napoleon See Care Tool for more details. Clyda Greener OTR/L   11/06/2020, 10:12 AM

## 2020-11-06 NOTE — Progress Notes (Signed)
CBG result 46. MD notified. PT asymptomatic. He is alert and responsive. Skin is warm and dry.   Pt eating lunch at this time. Has drank some OJ about 4 oz.   CBG rechecked. Now 111

## 2020-11-06 NOTE — Progress Notes (Signed)
Patient ID: Fernando [name], male   DOB: 08-24-1927, 86 y.o.   MRN: [id number]   Sw received notification from therapist to schedule family education for patient on Tuesday or Wednesday. Sw made attempt to schedule, no answer. Left vm.   [place], [place]

## 2020-11-07 ENCOUNTER — Ambulatory Visit (HOSPITAL_COMMUNITY): Payer: PPO | Admitting: Physical Therapy

## 2020-11-07 ENCOUNTER — Inpatient Hospital Stay (HOSPITAL_COMMUNITY): Payer: PPO | Admitting: Physical Therapy

## 2020-11-07 ENCOUNTER — Encounter (HOSPITAL_COMMUNITY): Payer: PPO | Admitting: Occupational Therapy

## 2020-11-07 ENCOUNTER — Inpatient Hospital Stay (HOSPITAL_COMMUNITY): Payer: PPO

## 2020-11-07 LAB — GLUCOSE, CAPILLARY
Glucose-Capillary: 105 mg/dL — ABNORMAL HIGH (ref 70–99)
Glucose-Capillary: 113 mg/dL — ABNORMAL HIGH (ref 70–99)
Glucose-Capillary: 116 mg/dL — ABNORMAL HIGH (ref 70–99)
Glucose-Capillary: 120 mg/dL — ABNORMAL HIGH (ref 70–99)

## 2020-11-07 MED ORDER — ACETAMINOPHEN 650 MG RE SUPP
650.0000 mg | Freq: Four times a day (QID) | RECTAL | Status: DC | PRN
  Filled 2020-11-07: qty 1

## 2020-11-07 MED ORDER — ACETAMINOPHEN 325 MG PO TABS
650.0000 mg | ORAL_TABLET | ORAL | Status: DC | PRN
  Administered 2020-11-07 – 2020-11-11 (×13): 650 mg via ORAL
  Filled 2020-11-07 (×13): qty 2

## 2020-11-07 MED ORDER — COLLAGENASE 250 UNIT/GM EX OINT
1.0000 "application " | TOPICAL_OINTMENT | Freq: Every day | CUTANEOUS | Status: DC
  Administered 2020-11-07 – 2020-11-10 (×3): 1 via TOPICAL
  Filled 2020-11-07: qty 30

## 2020-11-07 NOTE — Discharge Summary (Signed)
Physician Discharge Summary  Patient ID: Fernando [name] MRN: [id number] DOB/AGE: 1926/12/22 86 y.o.  Admit date: 10/14/2020 Discharge date: 11/11/2020  Discharge Diagnoses:  Principal Problem:   Right middle cerebral artery stroke [facility]) Active Problems:   Leukocytosis   Prediabetes   Dysphagia, post-stroke   Stage 3a chronic kidney disease (HCC) DVT prophylaxis Atrial fibrillation/tachybradycardia syndrome/pacemaker SCC of the carotid gland Hypertension Hypothyroidism Bacteremia Hyperlipidemia Acute blood loss anemia as well as history of GI bleed Chronic systolic congestive heart failure Left heel wound  Discharged Condition: Stable  Significant Diagnostic Studies: DG Chest 2 View  Result Date: 10/22/2020 CLINICAL DATA:  Cough EXAM: CHEST - 2 VIEW COMPARISON:  10/15/2020 FINDINGS: Cardiac shadow is enlarged but stable. Aortic calcifications are again seen. Pacing device is again noted. Central vascular congestion is again seen but improved from the prior study. Small effusions are noted bilaterally. No bony abnormality is seen. IMPRESSION: Vascular congestion and small effusions consistent with mild CHF although somewhat improved from the prior exam. Electronically Signed   By: Inez Catalina M.D.   On: 10/22/2020 22:05   DG Chest 2 View  Result Date: 10/15/2020 CLINICAL DATA:  Leukocytosis.  Hypertension. EXAM: CHEST - 2 VIEW COMPARISON:  October 09, 2020 FINDINGS: Cardiomegaly. The hila and mediastinum are normal. The 2 lead pacemaker is stable. No pneumothorax. Diffuse increased interstitial opacities in the lungs. Probable small effusions with underlying atelectasis. IMPRESSION: Diffuse interstitial opacities may represent pulmonary edema. An infectious process is not completely excluded. There is also small right pleural effusion and there may be a tiny left effusion. Electronically Signed   By: Dorise Bullion III M.D   On: 10/15/2020 17:01   DG Swallowing Func-Speech  Pathology  Result Date: 10/24/2020 Objective Swallowing Evaluation: Type of Study: MBS-Modified Barium Swallow Study  Patient Details Name: Fernando [name] MRN: [id number] Date of Birth: Mar 25, 1927 Today's Date: 10/24/2020 Past Medical History: Past Medical History: Diagnosis Date . Arthritis  . Atrial fibrillation ([place])  . Carotid artery disease ([place])   Dr. Kellie Simmering - s/p left CEA . Coronary artery disease  . Essential hypertension, benign  . Hyperlipidemia  . 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 This mass was initially felt to be 1.7 cm at the time of presentation but had enlarged significantly to 4 cm clinically at the time of surgery. Pathologically it was 3.4 cm. A lymph node deep to the parotid was enlarged clinically at the time of surgery but was negative  . Tachycardia-bradycardia syndrome [facility])  Past Surgical History: Past Surgical History: Procedure Laterality Date . CAROTID ENDARTERECTOMY Left Jan. 3, 2009  CE . CATARACT EXTRACTION W/ INTRAOCULAR LENS IMPLANT    Bilateral . ESOPHAGOGASTRODUODENOSCOPY N/A 03/29/2018  Procedure: ESOPHAGOGASTRODUODENOSCOPY (EGD);  Surgeon: Danie Binder, MD;  [facility];  Service: Endoscopy;  Laterality: N/A; . EYE SURGERY   . INSERT / REPLACE / REMOVE PACEMAKER   . IR INTRAVSC STENT CERV CAROTID W/EMB-PROT MOD SED INCL ANGIO  10/06/2020 . IR US GUIDE VASC ACCESS LEFT  10/06/2020 . IR US GUIDE VASC ACCESS RIGHT  10/06/2020 . IR US GUIDE VASC ACCESS RIGHT  10/06/2020 . PACEMAKER INSERTION  10-01-2012 . PARATHYROIDECTOMY  10/28/2012 . PAROTIDECTOMY  10/28/2012  Procedure: PAROTIDECTOMY;  Surgeon: Ascencion Dike, MD;  Location: [place];  Service: ENT;  Laterality: Right;  Total Parotidectomy . PERMANENT PACEMAKER INSERTION N/A 10/01/2012  Procedure: PERMANENT PACEMAKER INSERTION;  Surgeon: Evans Lance, MD;  Location: [facility] CATH LAB;   Service: Cardiovascular;  Laterality: N/A; . RADIOLOGY WITH ANESTHESIA N/A 10/07/2020  Procedure: MRI WITH ANESTHESIA;  Surgeon: Radiologist, Medication, MD;  Location: [place];  Service: Radiology;  Laterality: N/A; . RADIOLOGY WITH ANESTHESIA N/A 10/06/2020  Procedure: IR WITH ANESTHESIA;  Surgeon: Luanne Bras, MD;  Location: [place];  Service: Radiology;  Laterality: N/A; . TEE WITHOUT CARDIOVERSION N/A 10/10/2020  Procedure: TRANSESOPHAGEAL ECHOCARDIOGRAM (TEE);  Surgeon: Lelon Perla, MD;  Location: [facility] ENDOSCOPY;  Service: Cardiovascular;  Laterality: N/A; HPI: The pt is a 86 yo male presenting with L sided weakness and AMS. Upon work-up, found to have large R MCA stenosis and is now s/p stenting. Pt intubated for procedure 11/12 and extubated 11/13. PMH includes: a fib on Xarelto, CAD, HTN, HLD, parotidectomy 10/2012, pacemaker 10/2012. Pt admitted to [facility] 10/14/20.  Subjective: Pt was pleasant and cooperative Assessment / Plan / Recommendation CHL IP CLINICAL IMPRESSIONS 10/24/2020 Clinical Impression Pt presents with mild-mod oropharyngeal dysphagia that is largely unchanged in function since last MBSS conducted 10/09/20. MBSS was repeated today due to concerns for increase in coughing at bedside. Current pharyngeal deficits are suspected due to baseline structural abnormalities of cervical spine and hx of oral cancer with radiation treatments, exacerbated by weakness in setting of acute CVA. Pt's dysphagia is characterized by premature spillage and reduced epiglottic deflection, causing various liquid textures to penetrate into the laryngeal vestibule before the swallow. Although no aspiration was observed throughout study, consistent throat clearing and coughing noted after each presentation of barium, which is also very consistent with his bedside presentation. Nectar barium appeared to result in a larger volume of penetration and was more difficult for pt to clear from vestibule with cough  response in comparison to thin barium. Pt requires cueing to use slower rate, one sip at a time, and straw facilitated most efficient intake due to decreased labial seal and oral/facial weakness. Pt unable to masticate a trial of Dys 3 (mechanical soft) solids, ultimately leading to oral expectoration of PO. Dys 1 (puree) textures were consumed most efficiently and with no airway intrusion, but some pharyngeal residue noted. In addition, reduced upper esophageal sphincter (UES) opening was observed secondary to appearance of cervical lordosis. Pt consumed a barium pill whole in applesauce with efficient oral transit, but on brief scan of esophagus, it was observed to remain in upper half of esophagus until pt took additional sips of thin liquid. Recommend pt continue with current Dys 1 (puree) texture diet and thin liquids. Pt should follow solids with liquids (including pills) and produce frequent volitional coughing after POs to assist with clearance of potential penetrates. Pt should also use slow rate and take rest breaks as needed to coordinate breathing and swallowing sequence, given his decreased respiratory status and CHF dx. SLP will continue to provide skilled interventions to ensure diet safety, efficiency, and to work toward advancement and increased independence with use of compensatory swallow strategies. SLP Visit Diagnosis Dysphagia, oropharyngeal phase (R13.12) Attention and concentration deficit following -- Frontal lobe and executive function deficit following -- Impact on safety and function Moderate aspiration risk   CHL IP TREATMENT RECOMMENDATION 10/09/2020 Treatment Recommendations Therapy as outlined in treatment plan below   Prognosis 10/09/2020 Prognosis for Safe Diet Advancement Fair Barriers to Reach Goals -- Barriers/Prognosis Comment -- CHL IP DIET RECOMMENDATION 10/24/2020 SLP Diet Recommendations Dysphagia 1 (Puree) solids;Thin liquid Liquid Administration via Straw Medication  Administration Crushed with puree Compensations Follow  solids with liquid;Slow rate;Small sips/bites;Minimize environmental distractions;Hard cough after swallow Postural Changes Remain semi-upright after after feeds/meals (Comment);Seated upright at 90 degrees   CHL IP OTHER RECOMMENDATIONS 10/24/2020 Recommended Consults -- Oral Care Recommendations Oral care BID Other Recommendations --   CHL IP FOLLOW UP RECOMMENDATIONS 10/12/2020 Follow up Recommendations Inpatient Rehab   CHL IP FREQUENCY AND DURATION 10/09/2020 Speech Therapy Frequency (ACUTE ONLY) min 2x/week Treatment Duration 2 weeks      CHL IP ORAL PHASE 10/24/2020 Oral Phase Impaired Oral - Pudding Teaspoon -- Oral - Pudding Cup -- Oral - Honey Teaspoon NT Oral - Honey Cup -- Oral - Nectar Teaspoon -- Oral - Nectar Cup Lingual/palatal residue;Premature spillage;Weak lingual manipulation Oral - Nectar Straw NT Oral - Thin Teaspoon -- Oral - Thin Cup Right anterior bolus loss;Left anterior bolus loss;Weak lingual manipulation;Lingual/palatal residue;Piecemeal swallowing;Premature spillage Oral - Thin Straw Premature spillage;Weak lingual manipulation Oral - Puree Weak lingual manipulation Oral - Mech Soft Impaired mastication;Lingual/palatal residue;Reduced posterior propulsion;Weak lingual manipulation Oral - Regular -- Oral - Multi-Consistency -- Oral - Pill Weak lingual manipulation Oral Phase - Comment --  CHL IP PHARYNGEAL PHASE 10/24/2020 Pharyngeal Phase Impaired Pharyngeal- Pudding Teaspoon -- Pharyngeal -- Pharyngeal- Pudding Cup -- Pharyngeal -- Pharyngeal- Honey Teaspoon NT Pharyngeal -- Pharyngeal- Honey Cup -- Pharyngeal -- Pharyngeal- Nectar Teaspoon -- Pharyngeal -- Pharyngeal- Nectar Cup Reduced airway/laryngeal closure;Reduced anterior laryngeal mobility;Delayed swallow initiation-pyriform sinuses;Delayed swallow initiation-vallecula;Penetration/Aspiration before swallow;Reduced epiglottic inversion;Pharyngeal residue - valleculae  Pharyngeal Material enters airway, CONTACTS cords and not ejected out;Material enters airway, CONTACTS cords and then ejected out Pharyngeal- Nectar Straw NT Pharyngeal -- Pharyngeal- Thin Teaspoon -- Pharyngeal -- Pharyngeal- Thin Cup Reduced epiglottic inversion;Delayed swallow initiation-pyriform sinuses;Reduced anterior laryngeal mobility;Reduced airway/laryngeal closure;Penetration/Aspiration before swallow Pharyngeal Material enters airway, CONTACTS cords and then ejected out;Material enters airway, remains ABOVE vocal cords then ejected out Pharyngeal- Thin Straw Penetration/Aspiration before swallow;Reduced airway/laryngeal closure;Reduced anterior laryngeal mobility;Reduced epiglottic inversion Pharyngeal Material enters airway, remains ABOVE vocal cords then ejected out Pharyngeal- Puree Pharyngeal residue - valleculae;Pharyngeal residue - cp segment Pharyngeal -- Pharyngeal- Mechanical Soft Other (Comment) Pharyngeal -- Pharyngeal- Regular -- Pharyngeal -- Pharyngeal- Multi-consistency -- Pharyngeal -- Pharyngeal- Pill WFL Pharyngeal -- Pharyngeal Comment --  CHL IP CERVICAL ESOPHAGEAL PHASE 10/24/2020 Cervical Esophageal Phase -- Pudding Teaspoon -- Pudding Cup -- Honey Teaspoon -- Honey Cup -- Nectar Teaspoon -- Nectar Cup -- Nectar Straw -- Thin Teaspoon -- Thin Cup -- Thin Straw -- Puree -- Mechanical Soft -- Regular -- Multi-consistency -- Pill -- Cervical Esophageal Comment Reduced UES opening [name] 10/24/2020, 10:26 AM               Labs:  Basic Metabolic Panel: Recent Labs  Lab 11/06/20 0539  NA 136  K 4.2  CL 102  CO2 25  GLUCOSE 99  BUN 20  CREATININE 1.09  CALCIUM 8.6*    CBC: No results for input(s): WBC, NEUTROABS, HGB, HCT, MCV, PLT in the last 168 hours.  CBG: Recent Labs  Lab 11/08/20 2109 11/09/20 0609 11/09/20 1200 11/09/20 1613 11/09/20 2042  GLUCAP 123* 94 97 92 116*   Family history.  Sister with CAD and myocardial infarction.  Mother with  cirrhosis brother with brain cancer.  Denies any esophageal cancer rectal cancer or diabetes mellitus  Brief HPI:   Ott Zimmerle is a 86 y.o. right-handed male with history of atrial fibrillation maintained on Xarelto hypertension CKD stage III with creatinine 1.30-1.48, history of GI bleed 2019, CAD with pacemaker, hyperlipidemia, prior  left CEA, SCC of the carotid gland and recent TIA August 2021.  He quit smoking 47 years ago.  Per chart review lives with his children.  Was receiving assistance for ADLs and home care.  1 level home 6 steps to entry.  Presented 10/07/2020 to [facility] left-sided weakness as well as dysarthria.  Admission chemistries glucose 172 BUN 37 creatinine 1.49 lactic acid 5.0 BNP 1097 WBC 17,000 hemoglobin 9.1.  Cranial CT scan showed hyperdense right MCA suspicious for emergent large vessel occlusion.  No acute cortical based infarct or acute intracranial hemorrhage.  Patient did not receive TPA.  Carotid Dopplers with right ICA stenosis 80 to 99%.  Echocardiogram with ejection fraction of 40 to 45% without thrombus.  TEE showed akinesis of the distal septum and apex ejection fraction 50% no thrombus noted.  No vegetation.  CTA of the head and neck no emergent large vessel occlusion.  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  Delayed transit throughout the entire right cerebral hemisphere and left ACA territory related to high-grade proximal right ICA stenosis and chronically diminutive disease left ACA.  Patient underwent ICA angioplasty per interventional radiology.  He did require short-term intubation.  Hospital course fever blood cultures 2/2 G+ cocci maintained on Rocephin.  He was cleared to begin aspirin for CVA prophylaxis of right MCA infarct due to right ICA high-grade stenosis awaiting plan to resume chronic Xarelto.  Currently on a dysphagia #1 thin liquid diet.  Blood cultures 2/2 group B strep pansensitive placed on Ancef.  Therapy evaluations completed and  patient was admitted for a comprehensive rehab program   Hospital Course: Rhett Mutschler was admitted to rehab 10/14/2020 for inpatient therapies to consist of PT, ST and OT at least three hours five days a week. Past admission physiatrist, therapy team and rehab RN have worked together to provide customized collaborative inpatient rehab.  Pertaining to patient's right MCA infarction due to right ICA high-grade stenosis status post angioplasty now maintained on low-dose aspirin his chronic Xarelto had since been resumed he would follow-up neurology services.  History of atrial fibrillation tachybradycardia syndrome with pacemaker as discussed with neurology as well as cardiology services Xarelto resumed monitoring for any bleeding episodes.  Blood pressure remained controlled on Toprol.  His Cozaar had been increased to 50 mg on 10/26/2020 and continued with clonidine 0.1 mg 3 times daily as well as low-dose Lasix.  He would need follow-up outpatient.  In regards to patient's SCC of the carotid gland follow-up outpatient.  He continued on hormone supplement for hypothyroidism.  Noted hospital course bacteremia he completed a course of Ancef 10 days remaining afebrile.  Noted dysphagia secondary to CVA maintained on dysphagia #1 thin liquid diet.  CKD stage III baseline creatinine 1.30-1.48 with latest creatinine 1.09.Marland Kitchen  Acute blood loss anemia history of GI bleed 2019 patient was transfused 1 unit packed red blood cells 10/12/2020 continued iron supplement as well as Protonix.  Monitor closely while on Xarelto.  Prediabetes hemoglobin A1c 5.7 blood sugars overall controlled.  Chronic systolic congestive heart failure as noted low-dose diuretic no signs of fluid overload.  Patient with left heel wound follow-up wound care nurse noted 1 cm x 1 cm.  Santyl applied to left heel wound in a nickel layer cover with saline moistened gauze then dry gauze and wrapped with Kerlix daily as needed.   Blood pressures were  monitored on TID basis and controlled  Diabetes has been monitored with ac/hs CBG checks and SSI was use  prn for tighter BS control.    Rehab course: During patient's stay in rehab weekly team conferences were held to monitor patient's progress, set goals and discuss barriers to discharge. At admission, patient required max assist ambulate 3 feet rolling walker moderate assist for rolling max assist sit to stand.  Moderate assist upper body bathing max is lower body bathing mod assist upper body dressing max is lower body dressing max is toilet transfers  Physical exam.  Blood pressure 107/68 pulse 63 temperature 98 respirations 17 oxygen saturation 95% room air Constitutional.  No acute distress HEENT Head.  Normocephalic and atraumatic Eyes.  Pupils round and reactive to light no discharge.nystagmus Neck.  Supple nontender no JVD without thyromegaly Cardiac regular rate rhythm without any murmur Abdomen.  Soft nontender positive bowel sounds not rebound Respiratory effort normal no respiratory distress without wheeze Extremities.  No clubbing cyanosis or edema Neurologic.  Cranial nerves II through XII intact motor strength 5/5 right deltoid bicep tricep grip hip flexion knee extension ankle dorsi and plantar flexion.  2 -/5 left deltoid bicep tricep finger flexors 0 at the finger extensors 3 - left hip flexor knee extensor ankle dorsiflexion Sensation intact  He/  has had improvement in activity tolerance, balance, postural control as well as ability to compensate for deficits. He/ has had improvement in functional use RUE/LUE  and RLE/LLE as well as improvement in awareness.  Patient propels himself in wheelchair to the therapy gym.  Ambulates rolling walker minimal assist 25 feet.  Patient does fluctuate between posterior weight shift and neutral weight shift depending on fatigue.  Patient able to transfer to mat with rolling walker moderate assist.  Completed sit to stand from therapy mat  standard chair height minimal assist and verbal cues.  Patient able to remain standing 1 minute before needing seated rest breaks.  Transferring back to wheelchair rolling walker moderate assist without sessions.  Side-lying towards right with minimal assist for left hand placement.  Max assist for lower body clothing management.  Patient able to assist some with pulling left side of pants down.  He remained continent during his sessions.  Completed posterior pericare moderate assist.  Stood at sink to wash hands and rinse dentures with minimal assist to stand.  Donned bilateral gripper socks max assist.  Patient was able to communicate his needs.  Required overall min assist verbal and visual cues for problem solving use of a calendar to orient to accurate date.  He did use aids to recall discharge date and independently recalled one activity from earlier sessions.  Full family teaching completed plan discharge to home 11/11/2020 after his discharge was extended from 11/09/2020 to enable family teaching to be completed and established patient's mobility needs       Disposition: Discharge to home    Diet: Dysphagia #1 thin liquids  Special Instructions: No driving smoking or alcohol  Left heel wound.  Apply Santyl to the left heel wound in a nickel layer.  Cover with saline moistened gauze then dry gauze and wrapped with Kerlix daily as needed  Medications at discharge 1.  Tylenol as needed 2.  Aspirin 81 mg p.o. daily 3.  Lipitor 20 mg p.o. daily 4.  Clonidine 0.1 mg p.o. 2 times daily 5.  Ferrous sulfate 3 and 25 mg p.o. 3 times daily with meals 6.  Lasix 20 mg p.o. daily 7.  Synthroid 50 mcg p.o. daily 8.  Lidoderm patch as directed 9.  Cozaar 50 mg p.o. daily  10.  Toprol-XL 25 mg daily 11.  Protonix 40 mg p.o. twice daily 12.  Klor-Con 20 mg p.o. daily 13.  Xarelto 15 mg p.o. daily  30-35 minutes were spent completing discharge summary and discharge planning  Discharge  Instructions    Ambulatory referral to Neurology   Complete by: As directed    An appointment is requested in approximately 4 weeks right MCA infarction   Ambulatory referral to Physical Medicine Rehab   Complete by: As directed    Moderate complexity follow-up 1 to 2 weeks right MCA infarction       Follow-up Information    Kirsteins, Luanna Salk, MD Follow up.   Specialty: Physical Medicine and Rehabilitation Why: Office to call for appointment Contact information: [place] [address] [phone]        Evans Lance, MD Follow up.   Specialty: Cardiology Why: Call for appointment as needed Contact information: [address] [place] [address] [phone]               Signed: Lavon Paganini McDermitt 11/10/2020, 5:16 AM

## 2020-11-07 NOTE — Consult Note (Signed)
[place] Nurse Consult Note: Patient receiving care in [facility] [phone] Remote consult after reviewing chart pictures sent by bedside RN Mikki Harbor) Patient is scheduled for discharge today Reason for Consult: Heel wound Wound type: Unstageable heel wound Pressure Injury POA: Yes/No/NA Measurement: 1 cm x 1 cm taken by bedside RN on 11/05/20 Wound bed: Black eschar Drainage (amount, consistency, odor) Brown drainage on foam dressing.  Periwound: Erythmatous Dressing procedure/placement/frequency: Apply Santyl to left heel wound in a nickel thick layer. Cover with a saline moistened gauze, then dry gauze and wrap with kerlex  Monitor the wound area(s) for worsening of condition such as: Signs/symptoms of infection, increase in size, development of or worsening of odor, development of pain, or increased pain at the affected locations.   Notify the medical team if any of these develop.  Thank you for the consult. [place] nurse will not follow at this time.   Please re-consult the [place] team if needed.  Cathlean Marseilles Tamala Julian, MSN, RN, [place], Lysle Pearl, [facility] Wound Treatment Associate Pager [phone]

## 2020-11-07 NOTE — Progress Notes (Addendum)
Occupational Therapy Session Note  Patient Details  Name: Fernando [name] MRN: [id number] Date of Birth: 1927/11/23  Today's Date: 11/07/2020 OT Individual Time: 6378-5885 OT Individual Time Calculation (min): 56 min    Short Term Goals: Week 4:  OT Short Term Goal 1 (Week 4): Pt will complete sit to stand for progressing of LB selfcare with max assist. OT Short Term Goal 2 (Week 4): Pt will perform squat pivot transfer to the drop arm commode with max assist. OT Short Term Goal 3 (Week 4): Pt will thread 1 lower extremity when donning pants with mod A.  Skilled Therapeutic Interventions/Progress Updates:    Pt received semi-reclined in bed with son and daughter present. Session focus on family edu re: bed mobility, LB dressing, bed/chair transfers, and TIS mangement. Daughter reports that she has ordered the stedy and plans to purchase a bed rail. Pt completed side laying > R with mod A to bend BLE and for R hand placement without use of bed rail. Max A to lift trunk + move BLE off of bed for sup > sit EOB. Scooted forward on bed with max A to shift weight from bilat hips. Sitting EOB, donned TEDs + bilat grip socks with total A, pt able to assist to pull up. Provided edu on how to don TEDs if pt goes home with them. Max A to don pants to thread BLE + pull over hips in standing. Attempted sit <> stand with RW but unsuccessfully 2/2 significant L lean. Overall provided education on monitoring pt's L lean during transfers and how to determine when to use stedy vs attempt RW. Dep for sit<>Stand transfer in stedy to pull up pants. Transferred to [place] in stedy. Pt planning to purchase TIS for home use, therapist demonstrated how to remove leg rests and use tilt function for pressure relief. Son and daughter then facilitated chair > EOB transfer with stedy. Demonstrate understanding of stedy functions (breaks, seat). Discussed toilet transfer with  stedy and rec bathing in TIS or seated EOB for pt's  current function based on varying level of fatigue/assist levels. Pt completed siting EOB > lying down for rest break before next therapy with overall mod A to initiate transfer. Left pt semi-reclined in bed with bed alarm engaged, son and daughter present, and call bell in reach.   Therapy Documentation Precautions:  Precautions Precautions: ICD/Pacemaker,Fall Precaution Comments: L sided weakness; very HOH; dysarthria Restrictions Weight Bearing Restrictions: No  Pain: Pain Assessment Pain Scale: Faces Faces Pain Scale: Hurts little more Pain Location: Head   ADL: See Care Tool for more details.   Therapy/Group: Individual Therapy  Volanda Napoleon See Care Tool for more details. Clyda Greener OTR/L   11/07/2020, 12:12 PM

## 2020-11-07 NOTE — Progress Notes (Signed)
Physical Therapy Session Note  Patient Details  Name: Fernando [name] MRN: [id number] Date of Birth: 1927-04-11  Today's Date: 11/07/2020 PT Individual Time: 5638-7564 and 1420-1508 PT Individual Time Calculation (min): 46 min and 48 min  Short Term Goals: Week 3:  PT Short Term Goal 1 (Week 3): Pt will be able to perform bed <> chair transfer with mod assist PT Short Term Goal 2 (Week 3): Patient will complete sit <> stand transfers with CGA and LRAD PT Short Term Goal 3 (Week 3): Patient will ambulate 59ft with LRAD, wc follow if needed and MinA  Skilled Therapeutic Interventions/Progress Updates:    Session 1: Pt received supine in bed with noticeable L lateral trunk lean and pt's son and daughter present for education/training. Pt's family reports pt is not having a good day and that he is very tired. Despite this, pt agreeable to therapy session. Used amplifier for improved communication during session. Pt's family confirms they purchased a stedy, scheduled to be delivered this Saturday, Dec 18th, along with bedrails that should be delivered tomorrow. Family confirmed they are agreeable to TIS w/c consultation this afternoon. Therapist educated pt's family on his increased mobility level the past few days progressing to stand pivot transfers using RW; however, as seen today pt still has significant fluctuations in his mobility level based on fatigue requiring the use of a stedy at home for safe transfers. Therapist educated family on planning pt's activity levels each day in accordance to his activity needs on the upcoming days to decrease likelihood of severe fluctuations in mobility level and increased assistance needs. Pt's son reports feeling confident with assisting pt rolling and repositioning in sidelying with pillows positioned for pressure relief and is able to recall 2hr rolling schedule without cues - therapist demonstrated positioning in supine. Therapist educated pt's family on using  bed features to assist pt with mobility needs and to ensure safe caregiver body mechanics. Supine>sitting R EOB, HOB elevated and using bedrails, with pt's son providing mod assist for B LE management and trunk upright with pt demonstrating strong L posterior lean in sitting requiring mod assist initially for trunk control progressing to close supervision. Sit>stand EOB>RW, cuing for proper UE placement, heavy min assist for lifting into stand and for balance due to L lean, only able to stand ~10seconds prior to initiating return to sit due to fatigue requiring min assist for lowering. Performed same sit<>stand transfer with pt's son providing assistance - educated on proper use of gait belt and hand positioning to assist pt safely along with education on stabilizing RW as pt tends to push it forward. Attempted R stand pivot transfer EOB>TIS w/c using RW; however, once starting to initiate a step to perform transfer pt demos severe onset of pushing causing strong L lateral lean with L foot off of the floor and requiring max/total assist to return to sitting safely on EOB - due to this, pt unsafe to attempt this type of transfer at this time. Pt reporting significant fatigue and requests to lie down. Sit>supine with mod assist for trunk descent and B LE management into bed. Supine scoot towards HOB, +2 total assist via bed pads. Therapist discussed car transfer with family and educated on using ambulance transport home if pt is presenting at current mobility level due to safety concerns with performing the transfer and then using w/c [facility]ian[facility] Lei transport after D/C - pt's sister reports her husband and son have had to assist pt in the past and she feels  confident they can assist him, family does not want ambulance transport home. Therapist encouraged family to return for afternoon session to be present for w/c consultation and for additional hands-on training if pt more rested at that time. Pt left supine in bed with needs  in reach, HOB elevated, bed alarm on, and family present.   Session 2: Pt received supine in bed with his son and daughter present for additional family education/training along with w/c consultation. Pt agreeable to therapy session. Used amplifier for improved communication during session. Jason, ATP present for w/c consultation and educated on the following concerns regarding pt's wheelchair needs with current w/c prescription plan: - skin integrity with current breakdown on L heel - planning to provide boots on wheelchair for skin pressure relief  - skin integrity with current breakdown on sacrum and buttocks - need roho cushion - need for tilt feature to provide pressure relief; power tilt will allow pt ability to provide pressure relief without family assistance to decrease burden of care  - need for contoured back to support trunk control due to L lateral lean along with roho back due to skin integrity concerns  - need adjustable head support due to poor head control with prolonged sitting and fatigue  Family in agreement with recommended TIS wheelchair features. Supine>sitting R EOB, HOB partially elevated and using bedrails, with mod assist for B LE management and trunk upright - total assist via bed pads for scooting hips towards EOB. Sitting EOB demonstrates L lateral trunk lean requiring min/mod assist for trunk upright depending on fatigue level. With encouragement, pt agreeable to timed toileting. Therapist has pt's son assist him with toileting via use of stedy while therapist cued and educated on proper sequencing and technique. Sit>stand elevated EOB>stedy (therapist educating pt's family on using bed features to allow pt increased ability to come to stand with less assistance) with min assist from pt's son - reinforced education on proper use of gait belt and proper hand positioning to assist pt with lifting, while not pulling up on pt's arms. Stedy transport in/out of bathroom with pt's  son assisting, therapist providing close [place] for safety as pt extremely fatigued today causing severe L anterior trunk lean with pt barely able to keep head lifted off of steady bar. Sit<>stand to/from stedy seat with pt's son providing min assist - pt standing with B UE support on stedy bar while pt's son completed total assist LB clothing management. Sit<>stand to/from [facility] over toilet with pt's son providing mod assist for lifting/lowering - therapist educating pt's son to use B UE support under pt's hips to assist with coming to stand from this low surface and not pulling up on pt's arm. Educated pt's son on putting stedy seat in place immediately after pt comes to stand from [facility] so that when he fatigues from standing during peri-care he can sit on the perched seat instead of the low BSC. Pt stood with B UE support on stedy bar with close supervision while pt's son performed total assist peri-care and LB clothing management. Stedy transport back to EOB with same sit<>stand assist as above. Pt requesting to return to supine. Sit>supine with mod assist for B LE management and trunk descent. Once in supine, pt's family assisted pt in donning clean pants total assist with pt rolling R/L to pull pants up over hips. Therapist revisited the ambulance transport vs car transfer discussion with family and they continue to report feeling confident pt's daughter's husband and son can  safely assist pt in car transfer - encouraged them to attend training tomorrow to attempt transfer to car simulator and then to real car if felt necessary - they are in agreement. Asked SW to provide family resources on w/c transport to use after D/C. Pt left supine in bed with needs in reach, HOB elevated, bed alarm on, and family present.    Based on pt's significant fluctuations in mobility level and pt's family reporting stedy will not be delivered until Saturday 12/18 - therapist spoke with Jeneen Rinks, OT and then PA regarding extending pt's  LOS until all equipment can be delivered and set-up - will discuss further in team conference tomorrow.   Therapy Documentation Precautions:  Precautions Precautions: ICD/Pacemaker,Fall Precaution Comments: L sided weakness; very HOH; dysarthria Restrictions Weight Bearing Restrictions: No  Pain: Session 1: Reports back pain upon coming to sit EOB - RN notified - provided repositioning and rest breaks for pain management.   Session 2: Reports significant back pain upon coming to sit EOB - RN notified - provided repositioning and rest breaks for pain management.  Therapy/Group: Individual Therapy  Tawana Scale , PT, DPT, CSRS  11/07/2020, 7:58 AM

## 2020-11-07 NOTE — Progress Notes (Signed)
Speech Language Pathology Daily Session Note  Patient Details  Name: Fernando [name] MRN: [id number] Date of Birth: 12-19-1926  Today's Date: 11/07/2020 SLP Individual Time: 1347-1415 SLP Individual Time Calculation (min): 28 min  Short Term Goals: Week 3: SLP Short Term Goal 1 (Week 3): STG=LTG due to remaining length of stay  Skilled Therapeutic Interventions:Skilled ST services focused on education and swallow skills. Pt expressed headache pain and requested medication. Pt was able to utilize call bell with set up assist and min A verbal cues. Pt notified nurse. Pt consumed thin liquid via straw with min A verbal cues for small sips and to take a breath in between sips. Pt's son and daughter entered the room for planned education at the last 10 minutes of treatment. SLP provided eductaion with handouts pertaining to diet, swallow strategies, cognitive function and need for total A for complex problem solving such as medication/money/time management. The family asked appropriate questions pertaining to diet and expressed baseline swallow deficits. All question answered to satisfaction. Pt was left in room with family, call bell within reach and bed alarm set. SLP recommends to continue skilled services.     Pain Pain Assessment Pain Scale: Faces Pain Score: 4  Faces Pain Scale: Hurts little more Pain Type: Acute pain Pain Location: Head Pain Orientation: Anterior Pain Descriptors / Indicators: Aching Pain Frequency: Occasional Pain Onset: Gradual Patients Stated Pain Goal: 0 Pain Intervention(s): RN made aware  Therapy/Group: Individual Therapy  Fernando [name]  [facility] [address] 11/07/2020, 4:15 PM

## 2020-11-07 NOTE — Progress Notes (Signed)
[place] PHYSICAL MEDICINE & REHABILITATION PROGRESS NOTE   Subjective/Complaints: No complaints.  RN Mikki Harbor notes patient is asking for Tylenol for low back pain and headache more frequently than prescribed. AST and ALT normal on last checks- will increase tylenol frequency to q4H prn.   ROS: Limited due to cognitive/behavioral    Objective:   No results found. No results for input(s): WBC, HGB, HCT, PLT in the last 72 hours. Recent Labs    11/06/20 0539  NA 136  K 4.2  CL 102  CO2 25  GLUCOSE 99  BUN 20  CREATININE 1.09  CALCIUM 8.6*    Intake/Output Summary (Last 24 hours) at 11/07/2020 0947 Last data filed at 11/07/2020 0805 Gross per 24 hour  Intake 266.5 ml  Output --  Net 266.5 ml     Pressure Injury 10/14/20 Buttocks Left Deep Tissue Pressure Injury - Purple or maroon localized area of discolored intact skin or blood-filled blister due to damage of underlying soft tissue from pressure and/or shear. Purple/red area small fluid filled  (Active)  10/14/20 1700  Location: Buttocks  Location Orientation: Left  Staging: Deep Tissue Pressure Injury - Purple or maroon localized area of discolored intact skin or blood-filled blister due to damage of underlying soft tissue from pressure and/or shear.  Wound Description (Comments): Purple/red area small fluid filled blister noted at center  Present on Admission: Yes     Pressure Injury 11/05/20 Heel Left Deep Tissue Pressure Injury - Purple or maroon localized area of discolored intact skin or blood-filled blister due to damage of underlying soft tissue from pressure and/or shear. Black hard area to mid left heel with b (Active)  11/05/20 1322  Location: Heel  Location Orientation: Left  Staging: Deep Tissue Pressure Injury - Purple or maroon localized area of discolored intact skin or blood-filled blister due to damage of underlying soft tissue from pressure and/or shear.  Wound Description (Comments): Black hard  area to mid left heel with blanchable skin surrounding  Present on Admission: No    Physical Exam: Vital Signs Blood pressure 114/64, pulse 62, temperature 97.7 F (36.5 C), resp. rate 15, height 5\' 5"  (1.651 m), weight 56 kg, SpO2 100 %.  Gen: no distress, normal appearing HEENT: oral mucosa pink and moist, NCAT Cardio: Reg rate Chest: normal effort, normal rate of breathing Abd: soft, non-distended Ext: no edema Skin: intact Motor: Limited due to ability to follow commands-RUE: 5/5 proximal distal RLE: 4 -/5 proximal to distal LUE: 4 /5 proximal distal, appears unchanged LLE:  3/5 proximal distal   Assessment/Plan: 1. Functional deficits which require 3+ hours per day of interdisciplinary therapy in a comprehensive inpatient rehab setting.  Physiatrist is providing close team supervision and 24 hour management of active medical problems listed below.  Physiatrist and rehab team continue to assess barriers to discharge/monitor patient progress toward functional and medical goals  Care Tool:  Bathing  Bathing activity did not occur: Refused Body parts bathed by patient: Right arm,Left arm,Chest,Abdomen,Right upper leg,Left upper leg,Face   Body parts bathed by helper: Buttocks,Right lower leg,Left lower leg Body parts n/a: Front perineal area,Buttocks,Right upper leg,Left upper leg,Right lower leg,Left lower leg,Face (did not attempt LB bathing 2/2 time constraints)   Bathing assist Assist Level: Moderate Assistance - Patient 50 - 74%     Upper Body Dressing/Undressing Upper body dressing   What is the patient wearing?: Pull over shirt    Upper body assist Assist Level: Moderate Assistance - Patient 50 -  74%    Lower Body Dressing/Undressing Lower body dressing      What is the patient wearing?: Incontinence brief,Pants     Lower body assist Assist for lower body dressing: Dependent - Patient 0%     Toileting Toileting    Toileting assist Assist for  toileting: Dependent - Patient 0% (dep for sit<>stand in transfer for clothing mangaement, hygeine)     Transfers Chair/bed transfer  Transfers assist     Chair/bed transfer assist level: Moderate Assistance - Patient 50 - 74% Chair/bed transfer assistive device: Programmer, multimedia   Ambulation assist   Ambulation activity did not occur: Safety/medical concerns  Assist level: 2 helpers Assistive device: Walker-rolling Max distance: 23   Walk 10 feet activity   Assist  Walk 10 feet activity did not occur: Safety/medical concerns  Assist level: 2 helpers Assistive device: Walker-rolling   Walk 50 feet activity   Assist           Walk 150 feet activity   Assist Walk 150 feet activity did not occur: Safety/medical concerns         Walk 10 feet on uneven surface  activity   Assist Walk 10 feet on uneven surfaces activity did not occur: Safety/medical concerns         Wheelchair     Assist Will patient use wheelchair at discharge?: Yes Type of Wheelchair: Manual    Wheelchair assist level: Minimal Assistance - Patient > 75% Max wheelchair distance: 25    Wheelchair 50 feet with 2 turns activity    Assist        Assist Level: Moderate Assistance - Patient 50 - 74%   Wheelchair 150 feet activity     Assist      Assist Level: Moderate Assistance - Patient 50 - 74%    Medical Problem List and Plan: 1.Left side hemiparesis dysarthria/dysphagiasecondary to right MCA infarction due to right ICA high-grade stenosis status post angioplasty  Continue CIR PT, OT, SLP- ELOS 12/16 2. Antithrombotics: -DVT/anticoagulation:Per neurology stopping Xarelto  -antiplatelet therapy: Aspirin81 mg daily 3. Pain Management:Tylenol as needed- increase frequency to q4H prn for low back pain and headache. LFTs normal on mos recent check.   -CLBP add Kpad, also with lidoderm patch, pain appears to be well controlled,  continue current regimen.    4. Mood:Provide emotional support -antipsychotic agents: N/A 5. Neuropsych: This patientis notcapable of making decisions on hisown behalf. 6. Skin/Wound Care:Routine skin checks 7. Fluids/Electrolytes/Nutrition:BUN/Creat now normal    -supplement 8. Atrial fibrillation/tachybradycardia syndrome/pacemaker. Per Neuro and cardiology will resume Xarelto and monitor Hgb  Continue Toprol25 mg daily  Rate controlled 12/14- continue to monitor TID 9. SCC of the carotid gland. Follow-up outpatient 10. Hypertension. Toprol 25 mg daily.   Cozaar 25 started on 11/27- increased to 50mg  on 12/2   Vitals:   11/07/20 0527 11/07/20 0835  BP: (!) 170/52 114/64  Pulse: 63 62  Resp: 15   Temp: 97.7 F (36.5 C)   SpO2: 100%   change clonidine to 0.1 mg TID, improving, no further changes at this time Labile- continue to monitor.   11. Hypothyroidism. Synthroid 12. Bacteremia.Completed Ancef 2 g every 8 hours 10-daycourse 13.  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 Dysphagia # 1 thins liquids. May resume diet, drank H20 for me through a straw without cough or wet voice 15. CKD stage II/III. Baseline 1.30-1.48.   Creat now normal , GFR >60 16. Acute blood loss anemia with history  of GI bleed 2019. Patient transfused 1 unit packed red blood cells 10/12/2020. Continue iron supplement. Protonix 40 mg twice daily.   Hemoglobin 8.9 on 11/28 stable vs 11/22 ok to resume Xarelto as rec per Neuro and cardio Xarelto started 11/29 repeat hgb 9.2 12/4   17. Prediabetes. Hemoglobin A1c 5.7. SSI.   Slightly labile, but relatively controlled     18. Chronic  systolic congestive heart failure-compensated, cards has signed off   Lasix 20 daily per cardiology, appreciate recs Filed Weights   11/05/20 0405 11/06/20 0442 11/07/20 0500  Weight: 59.4 kg 58.5 kg 56 kg    12/11: weight stable: continue to monitor daily.   19,  Hypo K+  normal at 4.2 on 12/13 cont current dose   LOS: 24 days A FACE TO FACE EVALUATION WAS PERFORMED  Clide Deutscher Najir Roop 11/07/2020, 9:47 AM

## 2020-11-08 ENCOUNTER — Inpatient Hospital Stay (HOSPITAL_COMMUNITY): Payer: PPO | Admitting: Speech Pathology

## 2020-11-08 ENCOUNTER — Inpatient Hospital Stay (HOSPITAL_COMMUNITY): Payer: PPO | Admitting: Occupational Therapy

## 2020-11-08 ENCOUNTER — Inpatient Hospital Stay (HOSPITAL_COMMUNITY): Payer: PPO

## 2020-11-08 LAB — GLUCOSE, CAPILLARY
Glucose-Capillary: 87 mg/dL (ref 70–99)
Glucose-Capillary: 125 mg/dL — ABNORMAL HIGH (ref 70–99)
Glucose-Capillary: 98 mg/dL (ref 70–99)
Glucose-Capillary: 123 mg/dL — ABNORMAL HIGH (ref 70–99)

## 2020-11-08 MED ORDER — CLONIDINE HCL 0.1 MG PO TABS
0.1000 mg | ORAL_TABLET | Freq: Three times a day (TID) | ORAL | 11 refills | Status: DC
Start: 2020-11-08 — End: 2020-11-09

## 2020-11-08 MED ORDER — RIVAROXABAN 15 MG PO TABS: ORAL_TABLET | ORAL | 3 refills | Status: AC

## 2020-11-08 MED ORDER — FUROSEMIDE 20 MG PO TABS: 20.0000 mg | ORAL_TABLET | Freq: Every day | ORAL | 0 refills | Status: AC

## 2020-11-08 MED ORDER — FERROUS SULFATE 325 (65 FE) MG PO TABS: 325.0000 mg | ORAL_TABLET | Freq: Three times a day (TID) | ORAL | 3 refills | Status: AC

## 2020-11-08 MED ORDER — ATORVASTATIN CALCIUM 40 MG PO TABS: 40.0000 mg | ORAL_TABLET | Freq: Every morning | ORAL | 0 refills | Status: AC

## 2020-11-08 MED ORDER — METOPROLOL SUCCINATE ER 25 MG PO TB24: 25.0000 mg | ORAL_TABLET | Freq: Every day | ORAL | 0 refills | Status: AC

## 2020-11-08 MED ORDER — LOSARTAN POTASSIUM 50 MG PO TABS: 50.0000 mg | ORAL_TABLET | Freq: Every day | ORAL | 0 refills | Status: AC

## 2020-11-08 MED ORDER — PANTOPRAZOLE SODIUM 40 MG PO TBEC: 40.0000 mg | DELAYED_RELEASE_TABLET | Freq: Two times a day (BID) | ORAL | 0 refills | Status: AC

## 2020-11-08 MED ORDER — LIDOCAINE 5 % EX PTCH: 1.0000 | MEDICATED_PATCH | Freq: Every day | CUTANEOUS | 0 refills | Status: AC

## 2020-11-08 MED ORDER — ASPIRIN 81 MG PO TBEC: 81.0000 mg | DELAYED_RELEASE_TABLET | Freq: Every day | ORAL | 11 refills | Status: AC

## 2020-11-08 MED ORDER — ACETAMINOPHEN 325 MG PO TABS: 650.0000 mg | ORAL_TABLET | ORAL | Status: AC | PRN

## 2020-11-08 MED ORDER — PANTOPRAZOLE SODIUM 40 MG PO TBEC: 40.0000 mg | DELAYED_RELEASE_TABLET | Freq: Two times a day (BID) | ORAL | 3 refills | Status: DC

## 2020-11-08 MED ORDER — LEVOTHYROXINE SODIUM 50 MCG PO TABS: 50.0000 ug | ORAL_TABLET | Freq: Every day | ORAL | 3 refills | Status: AC

## 2020-11-08 MED ORDER — POTASSIUM CHLORIDE CRYS ER 20 MEQ PO TBCR: 20.0000 meq | EXTENDED_RELEASE_TABLET | Freq: Every day | ORAL | 0 refills | Status: AC

## 2020-11-08 NOTE — Progress Notes (Signed)
Occupational Therapy Session Note  Patient Details  Name: Fernando [name] MRN: [id number] Date of Birth: 04-30-1927  Today's Date: 11/08/2020 OT Individual Time: 2633-3545 OT Individual Time Calculation (min): 57 min    Short Term Goals: Week 4:  OT Short Term Goal 1 (Week 4): Pt will complete sit to stand for progressing of LB selfcare with max assist. OT Short Term Goal 2 (Week 4): Pt will perform squat pivot transfer to the drop arm commode with max assist. OT Short Term Goal 3 (Week 4): Pt will thread 1 lower extremity when donning pants with mod A.  Skilled Therapeutic Interventions/Progress Updates:    Pt received semi-reclined in bed with bed alarm engaged, agreeable to therapy. Discussed pt's extended LOS to 12/18, pt stated he is aware and agreeable. Side roll > R with min A for R hand placement on bed rail, sup > sit EOB with max A to lift trunk and move BLE off bed. Maintains static sitting balance with close supervision. Scoots forward with min A to readjust bilat foot placement. Donned bilat socks with total A. Sit > stand with RW attempts x2 with posterior lean in standing. Stand-pivot from EOB to TIS with RW + mod A to counteract posterior lean. Stand-pivot from TIS to toilet with RW + mod A to counteract posterior lean. Successfully voided and had BM. Sit <> stand with RW with mod A to scoot forward, total A for posterior peri care. Req 1 seated rest break then squat-pivot back to TIS with mod A.   Washed hands in TIS at sink with close supervision for thoroughness and set-up. Bathed at sink, doffed long-sleeved shirt with min A to manage R sleeve over hospital bracelets. Doffed bilat gripper socks with total A. Bathed UB body with close supervision + min VCs for thoroughness of task. Total A to doff brief. Bathed LB body with close supervision + min VCs for thoroughness of task, excluding washing buttocks which was completed earlier during toileting with total assist. Donned bilat  grip socks + TEDs with total A. Donned long sleeved shirt with distant supervision. Donned brief + pants with overall max A to thread BLE and to pull over hips in standing. Mod A + RW for sit<>stand transfer for hand placement and to facilitate upright posture. Left in TIS, tilted back ~15 degrees, safety belt placed, adapted call bell in reach.  Therapy Documentation Precautions:  Precautions Precautions: ICD/Pacemaker,Fall Precaution Comments: L sided weakness; very HOH; dysarthria Required Braces or Orthoses: Other Brace Other Brace: L PRAFO Restrictions Weight Bearing Restrictions: No  Pain: Pain Assessment Pain Scale: 0-10 Pain Score: 0-No pain ADL: See Care Tool for more details.   Therapy/Group: Individual Therapy  Volanda Napoleon MS, OTR/L Clyda Greener OTR/L   11/08/2020, 10:30 AM

## 2020-11-08 NOTE — Progress Notes (Signed)
Speech Language Pathology Daily Session Note  Patient Details  Name: Fernando [name] MRN: [id number] Date of Birth: Jul 27, 1927  Today's Date: 11/09/2020 SLP Individual Time: 3552-1747 SLP Individual Time Calculation (min): 42 min  Short Term Goals: Week 3: SLP Short Term Goal 1 (Week 3): STG=LTG due to remaining length of stay  Skilled Therapeutic Interventions: Pt was seen for skilled ST targeting cognitive goals. He required overall Moderate assistance to utilize compensatory strategies/aids such as memory notebook to recall daily information. SLP also administered the SLUMS, which he scored 10/30. This score is indicative of severe neurocognitive impairments. Most severe deficits noted with short term recall, problem solving and executive function. He was left sitting in wheelchair with alarm set and needs within reach. Continue per current plan of care.      Pain Pain Assessment Pain Scale: Faces Faces Pain Scale: No hurt  Therapy/Group: Individual Therapy  [name] 11/09/2020, 2:55 PM

## 2020-11-08 NOTE — Patient Care Conference (Signed)
Inpatient RehabilitationTeam Conference and Plan of Care Update Date: 11/08/2020   Time: 10:40 AM    Patient Name: Fernando [name]      Medical Record Number: [id number]  Date of Birth: [birth year] Sex: Male         Room/Bed: 4W17C/4W17C-01 Payor Info: Payor: [employer] / Plan: [employer] PPO / Product Type: *No Product type* /    Admit Date/Time:  10/14/2020  4:15 PM  Primary Diagnosis:  Right middle cerebral artery stroke [facility])  Hospital Problems: Principal Problem:   Right middle cerebral artery stroke (HCC) Active Problems:   Leukocytosis   Prediabetes   Dysphagia, post-stroke   Stage 3a chronic kidney disease [facility])    Expected Discharge Date: Expected Discharge Date: 11/11/20  Team Members Present: Physician leading conference: Dr. Alysia Penna Care Coodinator Present: Dorien Chihuahua, RN, BSN, CRRN;Christina Ashaway, [place] Nurse Present: Judee Clara, LPN PT Present: Estevan Ryder, PT OT Present: Clyda Greener, OT SLP Present: Jettie Booze, CF-SLP PPS Coordinator present : Ileana Ladd, Burna Mortimer, SLP     Current Status/Progress Goal Weekly Team Focus  Bowel/Bladder   incontinent B/B LBM 12/13, able to tell staff when wet  Prevent constipation and pt will develop sensation for B/B  toilet q2h   Swallow/Nutrition/ Hydration   Dys 1 textures (downgraded), thin liquids, Min-Mod A swallow strategies  Min  carryover swallow strategies, tolerance current diet, family eductaion   ADL's   set-up A to min A for seated grooming, self-feeding, seated UB bathing, max A for posterior peri-care, clothing management, dep in stedy for toilet transfer, min A to dep for stand-pivot or sit<>stand with RW, max for LB dressing/LB bathing, total A for socks + shoes  max A for func transfers, setup A to min for seated ADLs  selfcare retraining, DME ed, NMR, balance retraining, pt/family educaiton, functional strength/transfer retraining   Mobility   pt  continues to demonstrate significant fluctuations in mobility levels and required assistance - min to max assist bed mobility, min/mod assist sit<>stand and stand pivot transfer using RW or up to mod assist to stand in stedy requiring stedy transfers due to unsafe to perform transfer using RW, ambulating up to 24ft using RW with min assist and +2 w/c follow  goals downgraded: mod assist transfers and min assist short distance ambulation  activity tolerance, transfer training, B LE strengthening, standing balance and tolerance, sitting balance/trunk control, gait training, pt/family education, discharge planning   Communication   HOH, Min-Mod intelligibility strategies, close to baseline when dentured are donned (per son)  Min A  carryover speech intelligibility strateges, education with family   Safety/Cognition/ Behavioral Observations  Min-Mod A  Min A-Mod A  basic familiar functional problem solving, recall with aids/strategies, attention, awareness, family education   Pain   pt has lower back pain  pain goal 2/10  continue lidocaine patch, tylenol and k-pad   Skin   left buttock DTI, MASD to perineal area  wound healing  continue wound dressing orders and prevent further complications     Discharge Planning:  Discharging on Thursday. Medical transport? DME ordered. Jane set   Team Discussion: MD reported BP is controlled with meds, pressure areas and MASD healing,  Patient on target to meet rehab goals: no, goals downgraded due to fluctuation in functional level . Paitent can be min - mod - max assist. Struggles with left leans and stooped posture. Patient is able to ambulate up to 26' with min assist.  *See Care Plan and progress  notes for long and short-term goals.   Revisions to Treatment Plan:  D2 Theresa Mulligan diet revised to D1 Goals downgraded; cognition and PT goals specifically Teaching Needs: Transfers, toileting, medications, etc  Current Barriers to Discharge: Decreased caregiver  support, Home enviroment access/layout and custom wheelchair ordered and stedy with delivery delay  Possible Resolutions to Barriers:   Extended stay to Saturday to assure equipment is available at discharge Family education     Medical Summary Current Status: fluctuating level of assistance, CHF well compensated             I attest that I was present, lead the team conference, and concur with the assessment and plan of the team.   Dorien Chihuahua B 11/08/2020, 1:18 PM

## 2020-11-08 NOTE — Progress Notes (Signed)
Physical Therapy Session Note  Patient Details  Name: Fernando [name] MRN: [id number] Date of Birth: 1927-08-08  Today's Date: 11/08/2020 PT Individual Time: 1400-1500 PT Individual Time Calculation (min): 60 min   Short Term Goals: Week 3:  PT Short Term Goal 1 (Week 3): Pt will be able to perform bed <> chair transfer with mod assist PT Short Term Goal 2 (Week 3): Patient will complete sit <> stand transfers with CGA and LRAD PT Short Term Goal 3 (Week 3): Patient will ambulate 78ft with LRAD, wc follow if needed and MinA  Skilled Therapeutic Interventions/Progress Updates:    Patient received sitting up in wc, agreeable to PT. Son at bedside. He reports a headache, premedicated. PT propelling patient in wc to therapy gym for time management and energy conservation. PT educating son and patient on safe car transfer techniques. He reports dirt in driveway around where car can be parked, so he is unable to use Stedy to assist with this transfer safely. Due to tight space between car and patients wc, he appeared to be safest without use of RW. Patient was demonstrating significant posterior pushing today with limited ability to stand fully erect to transfer into car. MaxA stand pivot to car performed. Patient with greater difficulty exiting car as he appeared to be pushing with R UE blocking complete transfer back to wc. He required MaxA + MinA of a 2nd person to safely return to wc. Son demonstrating same stand pivot transfer to car with 2nd assist provided by PT. Son reports having 2nd assist available for car transfers and plans to use medical transport for medical appts as needed. PT attempting to demonstrate stand pivot transfers to mat from wc, but patient with persistent posterior lean resulting in unsafe transfer. PT dicussed with son use of Stedy on days when patient demonstrates strong pushing for safety since patient does fluctuate in level of assist needed quite a bit. Son verbalizing  understanding. Pt educated son on placement and use of gait belt to assist with transfers, allowing patient to hold onto his elbows during transfer to attempt to minimize occurrence of pushing with UEs. Son verbalizing understanding. Patient returning to room in wc, seatbelt alarm on, call light within reach, son as bedside.   Therapy Documentation Precautions:  Precautions Precautions: ICD/Pacemaker,Fall Precaution Comments: L sided weakness; very HOH; dysarthria Restrictions Weight Bearing Restrictions: No    Therapy/Group: Individual Therapy  [name], PT, DPT, CBIS  11/08/2020, 7:48 AM

## 2020-11-08 NOTE — Progress Notes (Signed)
Speech Language Pathology Daily Session Note  Patient Details  Name: Fernando [name] MRN: [id number] Date of Birth: 10/29/1927  Today's Date: 11/08/2020 SLP Individual Time: 6553-7482 SLP Individual Time Calculation (min): 27 min  Short Term Goals: Week 3: SLP Short Term Goal 1 (Week 3): STG=LTG due to remaining length of stay  Skilled Therapeutic Interventions: Pt was seen for skilled ST targeting cognitive-linguistic goals. Pt noted to have significantly more intelligible speech today, with only Min A verbal cues for increased vocal intensity. He remained 95% intelligible and spoke mostly at the sentence level during session. Pt required Mod A to use memory notebook as compensatory strategy to recall activities targeted in earlier OT session. Mod faded to Min A for recall and problem solving required during a basic 3-step action card sequencing task. Pt reported back pain and Min A verbal cues required for problem solving requesting pain medication from RN with use of call bell. Pt left sitting in chair with alarm set and needs within reach. Continue per current plan of care.          Pain Pain Assessment Pain Scale: 0-10 Pain Score: 0-No pain  Therapy/Group: Individual Therapy  [name] 11/08/2020, 2:42 PM

## 2020-11-08 NOTE — Progress Notes (Signed)
Patient ID: Trenden Hazelrigg, male   DOB: 04-16-1927, 86 y.o.   MRN: [id number] Team Conference Report to Patient/Family  Team Conference discussion was reviewed with the patient and caregiver, including goals, any changes in plan of care and target discharge date.  Patient and caregiver express understanding and are in agreement.  The patient has a target discharge date of 11/11/20.  Dyanne Iha 11/08/2020, 1:19 PM

## 2020-11-08 NOTE — Progress Notes (Signed)
Orthopedic Tech Progress Note Patient Details:  Fernando [name] November 16, 1927 [id number] Called [employer] and ordered left PRAFO Patient ID: Fernando [name], male   DOB: [birth year], 86 y.o.   MRN: [id number]   Petra Kuba 11/08/2020, 8:33 AM

## 2020-11-08 NOTE — Progress Notes (Signed)
[place] PHYSICAL MEDICINE & REHABILITATION PROGRESS NOTE   Subjective/Complaints:  Per OT very inconsistent with transfers   ROS: Limited due to cognitive/behavioral    Objective:   No results found. No results for input(s): WBC, HGB, HCT, PLT in the last 72 hours. Recent Labs    11/06/20 0539  NA 136  K 4.2  CL 102  CO2 25  GLUCOSE 99  BUN 20  CREATININE 1.09  CALCIUM 8.6*    Intake/Output Summary (Last 24 hours) at 11/08/2020 0817 Last data filed at 11/07/2020 2027 Gross per 24 hour  Intake 300 ml  Output --  Net 300 ml     Pressure Injury 10/14/20 Buttocks Left Deep Tissue Pressure Injury - Purple or maroon localized area of discolored intact skin or blood-filled blister due to damage of underlying soft tissue from pressure and/or shear. Purple/red area small fluid filled  (Active)  10/14/20 1700  Location: Buttocks  Location Orientation: Left  Staging: Deep Tissue Pressure Injury - Purple or maroon localized area of discolored intact skin or blood-filled blister due to damage of underlying soft tissue from pressure and/or shear.  Wound Description (Comments): Purple/red area small fluid filled blister noted at center  Present on Admission: Yes     Pressure Injury 11/05/20 Heel Left Deep Tissue Pressure Injury - Purple or maroon localized area of discolored intact skin or blood-filled blister due to damage of underlying soft tissue from pressure and/or shear. Black hard area to mid left heel with b (Active)  11/05/20 1322  Location: Heel  Location Orientation: Left  Staging: Deep Tissue Pressure Injury - Purple or maroon localized area of discolored intact skin or blood-filled blister due to damage of underlying soft tissue from pressure and/or shear.  Wound Description (Comments): Black hard area to mid left heel with blanchable skin surrounding  Present on Admission: No    Physical Exam: Vital Signs Blood pressure (!) 150/60, pulse 64, temperature (!)  97.5 F (36.4 C), resp. rate 18, height 5\' 5"  (1.651 m), weight 58.4 kg, SpO2 100 %.   General: No acute distress Mood and affect are appropriate Heart: Regular rate and rhythm no rubs murmurs or extra sounds Lungs: Clear to auscultation, breathing unlabored, no rales or wheezes Abdomen: Positive bowel sounds, soft nontender to palpation, nondistended Extremities: No clubbing, cyanosis, or edema Skin: Left heel eschar 1cm, no evidence of drainage or tenderness , min erythema surrounding eschar    RLE: 4 -/5 proximal to distal LUE: 4 /5 proximal distal, appears unchanged LLE:  3/5 proximal distal   Assessment/Plan: 1. Functional deficits which require 3+ hours per day of interdisciplinary therapy in a comprehensive inpatient rehab setting.  Physiatrist is providing close team supervision and 24 hour management of active medical problems listed below.  Physiatrist and rehab team continue to assess barriers to discharge/monitor patient progress toward functional and medical goals  Care Tool:  Bathing  Bathing activity did not occur: Refused Body parts bathed by patient: Right arm,Left arm,Chest,Abdomen,Right upper leg,Left upper leg,Face   Body parts bathed by helper: Buttocks,Right lower leg,Left lower leg Body parts n/a: Front perineal area,Buttocks,Right upper leg,Left upper leg,Right lower leg,Left lower leg,Face (did not attempt LB bathing 2/2 time constraints)   Bathing assist Assist Level: Moderate Assistance - Patient 50 - 74%     Upper Body Dressing/Undressing Upper body dressing   What is the patient wearing?: Pull over shirt    Upper body assist Assist Level: Moderate Assistance - Patient 50 - 74%  Lower Body Dressing/Undressing Lower body dressing      What is the patient wearing?: Pants     Lower body assist Assist for lower body dressing: Dependent - Patient 0% (sit<>stand in stedy to pull up)     Toileting Toileting    Toileting assist Assist for  toileting: Dependent - Patient 0% (dep for sit<>stand in transfer for clothing mangaement, hygeine)     Transfers Chair/bed transfer  Transfers assist     Chair/bed transfer assist level: Dependent - mechanical lift (stedy) Chair/bed transfer assistive device: Programmer, multimedia   Ambulation assist   Ambulation activity did not occur: Safety/medical concerns  Assist level: 2 helpers Assistive device: Walker-rolling Max distance: 23   Walk 10 feet activity   Assist  Walk 10 feet activity did not occur: Safety/medical concerns  Assist level: 2 helpers Assistive device: Walker-rolling   Walk 50 feet activity   Assist           Walk 150 feet activity   Assist Walk 150 feet activity did not occur: Safety/medical concerns         Walk 10 feet on uneven surface  activity   Assist Walk 10 feet on uneven surfaces activity did not occur: Safety/medical concerns         Wheelchair     Assist Will patient use wheelchair at discharge?: Yes Type of Wheelchair: Manual    Wheelchair assist level: Minimal Assistance - Patient > 75% Max wheelchair distance: 25    Wheelchair 50 feet with 2 turns activity    Assist        Assist Level: Moderate Assistance - Patient 50 - 74%   Wheelchair 150 feet activity     Assist      Assist Level: Moderate Assistance - Patient 50 - 74%    Medical Problem List and Plan: 1.Left side hemiparesis dysarthria/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 Antithrombotics: -DVT/anticoagulation:Per neurology stopping Xarelto  -antiplatelet therapy: Aspirin81 mg daily 3. Pain Management:Tylenol as needed- increase frequency to q4H prn for low back pain and headache. LFTs normal on mos recent check.   -CLBP add Kpad, also with lidoderm patch,  pain appears to be well controlled, continue current regimen.    4. Mood:Provide emotional support -antipsychotic agents: N/A 5. Neuropsych: This patientis notcapable of making decisions on hisown behalf. 6. Skin/Wound Care:Routine skin checks PRAFO and local care for L heel decub 7. Fluids/Electrolytes/Nutrition:BUN/Creat now normal    -supplement 8. Atrial fibrillation/tachybradycardia syndrome/pacemaker. Per Neuro and cardiology will resume Xarelto and monitor Hgb  Continue Toprol25 mg daily  Rate controlled 12/14- continue to monitor TID 9. SCC of the carotid gland. Follow-up outpatient 10. Hypertension. Toprol 25 mg daily.   Cozaar 25 started on 11/27- increased to 50mg  on 12/2   Vitals:   11/07/20 2033 11/08/20 0541  BP: (!) 153/65 (!) 150/60  Pulse: 63 64  Resp:  18  Temp: 97.7 F (36.5 C) (!) 97.5 F (36.4 C)  SpO2: 100% 100%  mild elevation will hold off on increased dose of clonidine unless BP remains consistently elevated   11. Hypothyroidism. Synthroid 12. Bacteremia.Completed Ancef 2 g every 8 hours 10-daycourse 13.  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 Dysphagia # 1 thins liquids. May resume diet, drank H20 for me through a straw without cough or wet voice 15. CKD  stage II/III. Baseline 1.30-1.48.   Creat now normal , GFR >60 16. Acute blood loss anemia with history of GI bleed 2019. Patient transfused 1 unit packed red blood cells 10/12/2020. Continue iron supplement. Protonix 40 mg twice daily.   Hemoglobin 8.9 on 11/28 stable vs 11/22 ok to resume Xarelto as rec per Neuro and cardio Xarelto started 11/29 repeat hgb 9.2 12/4   17. Prediabetes. Hemoglobin A1c 5.7. SSI.   Slightly labile, but relatively controlled     18. Chronic  systolic congestive heart failure-compensated, cards has signed off   Lasix 20 daily per cardiology, appreciate recs Filed Weights   11/06/20 0442 11/07/20 0500 11/08/20 0500   Weight: 58.5 kg 56 kg 58.4 kg    12/11: weight stable: continue to monitor daily.   19,  Hypo K+ normal at 4.2 on 12/13 cont current dose   LOS: 25 days A FACE TO FACE EVALUATION WAS PERFORMED  Charlett Blake 11/08/2020, 8:17 AM

## 2020-11-08 NOTE — Progress Notes (Signed)
Speech Language Pathology Discharge Summary  Patient Details  Name: Fernando [name] MRN: [id number] Date of Birth: 1926-12-25  Patient has met 8 of 8 long term goals.  Patient to discharge at overall Min;Mod level.  Reasons goals not met: n/a   Clinical Impression/Discharge Summary:   Pt made functional gains and met 8 out of 8 long term goals this admission. Pt is currently Min-Mod assist for basic functional familiar due to cognitive impairments impacting his short term memory, intellectual and emergent awareness, and problem solving abilities. He also has mild-mod dysarthria impacting his speech intelligibility due to abnormal respiratory pattern, strained/strangled vocal quality, and mild articulatory imprecision, but he is using strategies to compensate and increase intelligibility with Min A cueing and is very near baseline for speech (per son's report). Pt is consuming a dysphagia 1 texture diet with thin liquids and is using compensatory safe swallow strategies with Min A cues. He was briefly upgraded to Dys 2, however due to inefficient mastication and pt's preference for purees, he was downgraded back to Dys 1. Pt has demonstrated improved speech intelligibility, use of compensatory memory strategies, and basic familiar problem solving skills. However, given cognitive-linguistic and swallowing deficits still present, recommend pt continue to receive skilled ST services upon discharge, in addition to 24/7 supervision. Pt and family education is complete at this time.   Care Partner:  Caregiver Able to Provide Assistance: Yes  Type of Caregiver Assistance: Cognitive  Recommendation:  24 hour supervision/assistance;Home Health SLP  Rationale for SLP Follow Up: Maximize functional communication;Maximize swallowing safety;Maximize cognitive function and independence;Reduce caregiver burden   Equipment: none   Reasons for discharge: Discharged from hospital   Patient/Family Agrees with  Progress Made and Goals Achieved: Yes    [name] 11/10/2020, 7:25 AM

## 2020-11-09 ENCOUNTER — Inpatient Hospital Stay (HOSPITAL_COMMUNITY): Payer: PPO | Admitting: Physical Therapy

## 2020-11-09 ENCOUNTER — Inpatient Hospital Stay (HOSPITAL_COMMUNITY): Payer: PPO | Admitting: Speech Pathology

## 2020-11-09 ENCOUNTER — Inpatient Hospital Stay (HOSPITAL_COMMUNITY): Payer: PPO | Admitting: Occupational Therapy

## 2020-11-09 LAB — GLUCOSE, CAPILLARY
Glucose-Capillary: 94 mg/dL (ref 70–99)
Glucose-Capillary: 97 mg/dL (ref 70–99)
Glucose-Capillary: 92 mg/dL (ref 70–99)
Glucose-Capillary: 116 mg/dL — ABNORMAL HIGH (ref 70–99)

## 2020-11-09 MED ORDER — CLONIDINE HCL 0.1 MG PO TABS
0.1000 mg | ORAL_TABLET | Freq: Two times a day (BID) | ORAL | Status: DC
  Administered 2020-11-09 – 2020-11-11 (×4): 0.1 mg via ORAL
  Filled 2020-11-09 (×4): qty 1

## 2020-11-09 MED ORDER — CLONIDINE HCL 0.1 MG PO TABS: 0.1000 mg | ORAL_TABLET | Freq: Two times a day (BID) | ORAL | 11 refills | Status: AC

## 2020-11-09 NOTE — Progress Notes (Signed)
Physical Therapy Session Note  Patient Details  Name: Fernando [name] MRN: [id number] Date of Birth: 11-06-1927  Today's Date: 11/09/2020 PT Individual Time: 0910-0951 PT Individual Time Calculation (min): 41 min   Short Term Goals: Week 3:  PT Short Term Goal 1 (Week 3): Pt will be able to perform bed <> chair transfer with mod assist PT Short Term Goal 2 (Week 3): Patient will complete sit <> stand transfers with CGA and LRAD PT Short Term Goal 3 (Week 3): Patient will ambulate 51ft with LRAD, wc follow if needed and MinA  Skilled Therapeutic Interventions/Progress Updates:    Pt received supine in bed and agreeable to therapy session. Declines wearing amplifier today. Donned B LE TED hoes total assist. Supine>sitting R EOB, used HOB elevation and bedrails to increase pt independence as he will have these features at home, with min assist for trunk upright. Pt demos significant improvement in energy levels today that result in increased independence with functional mobility. Sitting EOB with supervision for sitting balance, much improved midline orientation, while donning shoes max assist. Sit>stand EOB>RW with min assist for lifting/balance due to slight L lean. R stand pivot to w/c using RW with min assist for balance and AD management -  pt starts to become slightly anxious while turning resulting in impaired motor planning and starting to lean posteriorly but with max sequential cuing able to complete transfer safely. Transported in/out of bathroom in w/c. L stand pivot w/c>BSC over toilet using B UE support on grab bar and min assist for balance - continued max sequential cuing for hand placement and stepping with pt requiring increased time to complete. Standing with min assist for balance while +2 assist performed total assist LB clothing management - noted to be incontinent of bladder, able to void bowels continently. Standing with UE support on grab bar and w/c arm rest with min assist for  balance while +2 performed total assist peri-care - pt had to sit 2x during this task due to impaired endurance but also due to impaired standing balance and pt starting to lean posteriorly requiring assist to return to sitting for safety. R stand pivot BSC over toilet>w/c using UE support on grab bar and w/c arm rest with min assist for balance - continued max cuing for sequencing.  Transported to/from gym in w/c for time management and energy conservation. Repeated simulated car transfer, 24in height, via stand pivot without AD (no room inside car door for RW) with min/mod assist for balance - again cuing for placement of UE support on w/c arm rest and on the handle inside of the vehicle - continues to demo mild posterior lean - min assist for LE management into the vehicle. Throughout session pt starts to lean posteriorly when standing and during transfers - upon attempts and physically facilitating return to midline it causes worsening lean/pushing therefore educated pt on need for his family to provide verbal instructions as opposed to providing increased physical assistance. Also, discussed importance of placing UEs on RW, grab bar, armrest, bedrail, etc immediately during transfer so that he can keep his hands on continuous support because when attempting to transition hand position during transfer, it causes worse lean/pushing due to increased fear of falling and impaired motor planning. Transported back to room in w/c and pt left tilted back in TIS w/c with needs in reach and seat belt alarm on.  Therapy Documentation Precautions:  Precautions Precautions: ICD/Pacemaker,Fall Precaution Comments: L sided weakness; very HOH; dysarthria Required Braces or Orthoses:  Other Brace Other Brace: L PRAFO Restrictions Weight Bearing Restrictions: No  Pain: Continues to report some low back pain - provided repositioning and rest breaks for pain management.     Therapy/Group: Individual Therapy  Tawana Scale , PT, DPT, CSRS  11/09/2020, 7:58 AM

## 2020-11-09 NOTE — Progress Notes (Signed)
[facility] PHYSICAL MEDICINE & REHABILITATION PROGRESS NOTE   Subjective/Complaints: Patient has no complaints this morning.  Discussed with team, plan for discharge on Saturday.   ROS: Limited due to cognitive/behavioral    Objective:   No results found. No results for input(s): WBC, HGB, HCT, PLT in the last 72 hours. No results for input(s): NA, K, CL, CO2, GLUCOSE, BUN, CREATININE, CALCIUM in the last 72 hours.  Intake/Output Summary (Last 24 hours) at 11/09/2020 0952 Last data filed at 11/09/2020 0932 Gross per 24 hour  Intake 270 ml  Output 1 ml  Net 269 ml     Pressure Injury 10/14/20 Buttocks Left Deep Tissue Pressure Injury - Purple or maroon localized area of discolored intact skin or blood-filled blister due to damage of underlying soft tissue from pressure and/or shear. Purple/red area small fluid filled  (Active)  10/14/20 1700  Location: Buttocks  Location Orientation: Left  Staging: Deep Tissue Pressure Injury - Purple or maroon localized area of discolored intact skin or blood-filled blister due to damage of underlying soft tissue from pressure and/or shear.  Wound Description (Comments): Purple/red area small fluid filled blister noted at center  Present on Admission: Yes     Pressure Injury 11/05/20 Heel Left Deep Tissue Pressure Injury - Purple or maroon localized area of discolored intact skin or blood-filled blister due to damage of underlying soft tissue from pressure and/or shear. Black hard area to mid left heel with b (Active)  11/05/20 1322  Location: Heel  Location Orientation: Left  Staging: Deep Tissue Pressure Injury - Purple or maroon localized area of discolored intact skin or blood-filled blister due to damage of underlying soft tissue from pressure and/or shear.  Wound Description (Comments): Black hard area to mid left heel with blanchable skin surrounding  Present on Admission: No    Physical Exam: Vital Signs Blood pressure (!) 150/64,  pulse 62, temperature (!) 97.4 F (36.3 C), temperature source Oral, resp. rate 18, height 5\' 5"  (1.651 m), weight 59.4 kg, SpO2 98 %. Gen: no distress, normal appearing HEENT: oral mucosa pink and moist, NCAT Cardio: Reg rate Chest: normal effort, normal rate of breathing Abd: soft, non-distended Ext: no edema  Skin: Left heel eschar 1cm, no evidence of drainage or tenderness , min erythema surrounding eschar    RLE: 4 -/5 proximal to distal LUE: 4 /5 proximal distal, appears unchanged LLE:  3/5 proximal distal   Assessment/Plan: 1. Functional deficits which require 3+ hours per day of interdisciplinary therapy in a comprehensive inpatient rehab setting.  Physiatrist is providing close team supervision and 24 hour management of active medical problems listed below.  Physiatrist and rehab team continue to assess barriers to discharge/monitor patient progress toward functional and medical goals  Care Tool:  Bathing  Bathing activity did not occur: Refused Body parts bathed by patient: Right arm,Left arm,Chest,Abdomen,Right upper leg,Left upper leg,Face,Front perineal area,Right lower leg,Left lower leg   Body parts bathed by helper: Buttocks Body parts n/a: Front perineal area,Buttocks,Right upper leg,Left upper leg,Right lower leg,Left lower leg,Face (did not attempt LB bathing 2/2 time constraints)   Bathing assist Assist Level: Minimal Assistance - Patient > 75% (for buttocks + thoroughness)     Upper Body Dressing/Undressing Upper body dressing   What is the patient wearing?: Pull over shirt    Upper body assist Assist Level: Minimal Assistance - Patient > 75%    Lower Body Dressing/Undressing Lower body dressing      What is the patient wearing?:  Pants,Incontinence brief     Lower body assist Assist for lower body dressing: Maximal Assistance - Patient 25 - 49%     Toileting Toileting    Toileting assist Assist for toileting: Total Assistance - Patient <  25%     Transfers Chair/bed transfer  Transfers assist     Chair/bed transfer assist level: Moderate Assistance - Patient 50 - 74% (stand pivot with RW) Chair/bed transfer assistive device: Programmer, multimedia   Ambulation assist   Ambulation activity did not occur: Safety/medical concerns  Assist level: 2 helpers Assistive device: Walker-rolling Max distance: 23   Walk 10 feet activity   Assist  Walk 10 feet activity did not occur: Safety/medical concerns  Assist level: 2 helpers Assistive device: Walker-rolling   Walk 50 feet activity   Assist           Walk 150 feet activity   Assist Walk 150 feet activity did not occur: Safety/medical concerns         Walk 10 feet on uneven surface  activity   Assist Walk 10 feet on uneven surfaces activity did not occur: Safety/medical concerns         Wheelchair     Assist Will patient use wheelchair at discharge?: Yes Type of Wheelchair: Manual    Wheelchair assist level: Minimal Assistance - Patient > 75% Max wheelchair distance: 25    Wheelchair 50 feet with 2 turns activity    Assist        Assist Level: Moderate Assistance - Patient 50 - 74%   Wheelchair 150 feet activity     Assist      Assist Level: Moderate Assistance - Patient 50 - 74%    Medical Problem List and Plan: 1.Left side hemiparesis dysarthria/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 Antithrombotics: -DVT/anticoagulation:Per neurology stopping Xarelto  -antiplatelet therapy: Aspirin81 mg daily 3. Pain Management:Tylenol as needed- increase frequency to q4H prn for low back pain and headache. LFTs normal on mos recent check.   -CLBP add Kpad, also with lidoderm patch, pain appears to be well controlled, continue current regimen.    4.  Mood:Provide emotional support -antipsychotic agents: N/A 5. Neuropsych: This patientis notcapable of making decisions on hisown behalf. 6. Skin/Wound Care:Routine skin checks PRAFO and local care for L heel decub 7. Fluids/Electrolytes/Nutrition:BUN/Creat now normal    -supplement 8. Atrial fibrillation/tachybradycardia syndrome/pacemaker. Per Neuro and cardiology will resume Xarelto and monitor Hgb  Continue Toprol25 mg daily  Rate controlled 12/14- continue to monitor TID 9. SCC of the carotid gland. Follow-up outpatient 10. Hypertension. Toprol 25 mg daily.   Cozaar 25 started on 11/27- increased to 50mg  on 12/2   Vitals:   11/09/20 0424 11/09/20 0817  BP:  (!) 150/64  Pulse:  62  Resp:    Temp:    SpO2: 98%   BP soft this morning so clonidine was held- high as a result later in the day. Reduce clonidine to BID to avoid soft reads and medication being held.   11. Hypothyroidism. Synthroid 12. Bacteremia.Completed Ancef 2 g every 8 hours 10-daycourse 13.  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 Dysphagia # 1 thins liquids. May resume diet, drank H20 for me through a straw without cough or wet voice 15. CKD stage II/III. Baseline 1.30-1.48.   Creat now normal , GFR >60 16. Acute  blood loss anemia with history of GI bleed 2019. Patient transfused 1 unit packed red blood cells 10/12/2020. Continue iron supplement. Protonix 40 mg twice daily.   Hemoglobin 8.9 on 11/28 stable vs 11/22 ok to resume Xarelto as rec per Neuro and cardio Xarelto started 11/29 repeat hgb 9.2 12/4   17. Prediabetes. Hemoglobin A1c 5.7. SSI.   Slightly labile, but relatively controlled, continue AC/HS reads    18. Chronic  systolic congestive heart failure-compensated, cards has signed off   Lasix 20 daily per cardiology, appreciate recs Filed Weights   11/07/20 0500 11/08/20 0500 11/09/20 0500  Weight: 56 kg 58.4 kg 59.4 kg    12/11: weight stable:  continue to monitor daily.   19,  Hypo K+ normal at 4.2 on 12/13 cont current dose   LOS: 26 days A FACE TO FACE EVALUATION WAS PERFORMED  Fernando [name] 11/09/2020, 9:52 AM

## 2020-11-09 NOTE — Progress Notes (Signed)
Occupational Therapy Session Note  Patient Details  Name: Fernando [name] MRN: [id number] Date of Birth: 1926-12-16  Today's Date: 11/09/2020 OT Individual Time: 1117-1200 OT Individual Time Calculation (min): 43 min    Short Term Goals: Week 4:  OT Short Term Goal 1 (Week 4): Pt will complete sit to stand for progressing of LB selfcare with max assist. OT Short Term Goal 2 (Week 4): Pt will perform squat pivot transfer to the drop arm commode with max assist. OT Short Term Goal 3 (Week 4): Pt will thread 1 lower extremity when donning pants with mod A.  Skilled Therapeutic Interventions/Progress Updates:    Pt completed toilet transfer from the sink to the 3:1 over the toilet with mod assist using the RW.  Sit to stand was min assist from the [place] wheelchair as well as from the 3:1.  He demonstrated increased trunk flexion in standing as well as with mobility.  Mod demonstrational cueing for staying inside the walker and squaring himself up to the edge of the wheelchair before sitting down.  He needed mod assist for clothing management prior to toilet with max assist after completion of BM.  Mod assist was also needed for thoroughness during toilet hygiene.  Next, had him ambulate back out to the wheelchair at the sink with mod assist using the RW.  He was able to sit and complete washing his hands with min instructional cueing and supervision as well as shaving with the electric razor.  He was able to turn on the razor and integrate BUEs to shave himself, but needed mod demonstrational cueing for thoroughness.  Finished session with sit to stand intervals and marching in place with mod facilitation using BUEs for support on the sink.  Increased trunk flexion noted with weightshift to the left.  He completed two intervals of approximately 1 min standing to complete this.  Pt left in the wheelchair at end of session with the call button and phone in reach and safety belt in place.    Therapy  Documentation Precautions:  Precautions Precautions: ICD/Pacemaker,Fall Precaution Comments: L sided weakness; very HOH; dysarthria Required Braces or Orthoses: Other Brace Other Brace: L PRAFO Restrictions Weight Bearing Restrictions: No  Pain: Pain Assessment Pain Scale: Faces Pain Score: 0-No pain ADL: See Care Tool Section for some details of mobility and selfcare  Therapy/Group: Individual Therapy  Fantashia Shupert OTR/L 11/09/2020, 12:08 PM

## 2020-11-09 NOTE — Progress Notes (Signed)
Held 8am clonidine dose for BP. PA aware, no complications noted at this time. Audie Clear, LPN

## 2020-11-10 ENCOUNTER — Inpatient Hospital Stay (HOSPITAL_COMMUNITY): Payer: PPO | Admitting: Physical Therapy

## 2020-11-10 ENCOUNTER — Inpatient Hospital Stay (HOSPITAL_COMMUNITY): Payer: PPO | Admitting: Speech Pathology

## 2020-11-10 ENCOUNTER — Inpatient Hospital Stay (HOSPITAL_COMMUNITY): Payer: PPO | Admitting: Occupational Therapy

## 2020-11-10 LAB — GLUCOSE, CAPILLARY
Glucose-Capillary: 91 mg/dL (ref 70–99)
Glucose-Capillary: 112 mg/dL — ABNORMAL HIGH (ref 70–99)
Glucose-Capillary: 109 mg/dL — ABNORMAL HIGH (ref 70–99)
Glucose-Capillary: 93 mg/dL (ref 70–99)

## 2020-11-10 NOTE — Discharge Summary (Signed)
Occupational Therapy Discharge Summary  Patient Details  Name: Fernando [name] MRN: [id number] Date of Birth: 05-08-1927  Today's Date: 11/10/2020 OT Individual Time: 5631-4970 OT Individual Time Calculation (min): 30 min    Patient has met 2 of 12 long term goals due to improved activity tolerance, improved balance, functional use of  RIGHT upper and LEFT upper extremity and improved func strength.  Patient to discharge at overall dep for functional transfers in stedy.  See below for more details.Patient's care partner is independent to provide the necessary physical and cognitive assistance at discharge.    Reasons goals not met: Majority of goals overall not met primarily due to pt's fluctuating assist level 2/2 fatigue preventing consistent enough performance to meet goals. Pt cont to frequently req stedy to complete func transfers. Seated ADL performance overall is min A. Pt's family has purchased stedy for home use and was educated on pt's varying assist levels and is able to provide the necessary assist at home to compensate. Pt overall has demonstrated improved func use of BUE + activity tolerance and has overall improved ind in ADL performance to reduce caregiver burden.   Recommendation:  Patient will benefit from ongoing skilled OT services in home health setting to continue to advance functional skills in the area of BADL and Reduce care partner burden.  Equipment: 3in1  Reasons for discharge: discharge from hospital  Patient/family agrees with progress made and goals achieved: Yes  OT Discharge Precautions/Restrictions  Precautions Precautions: ICD/Pacemaker;Fall Precaution Comments: L sided weakness; very HOH; dysarthria Required Braces or Orthoses: Other Brace Other Brace: L PRAFO Restrictions Weight Bearing Restrictions: No Pain Pain Assessment Pain Scale: Faces Faces Pain Scale: Hurts even more Pain Location: Back ADL ADL Eating: Minimal assistance Where  Assessed-Eating: Wheelchair Grooming: Minimal assistance Where Assessed-Grooming: Wheelchair Upper Body Bathing: Minimal assistance Where Assessed-Upper Body Bathing: Wheelchair Lower Body Bathing: mod to dep in stedy for transfer most consistently to bathe buttocks Where Assessed-Lower Body Bathing: Wheelchair Upper Body Dressing: Minimal assistance Where Assessed-Upper Body Dressing: Wheelchair Lower Body Dressing: Maximal assistance to dep in stedy for transfer most consistently  Where Assessed-Lower Body Dressing: Wheelchair Toileting: Maximal assistance to dep in stedy for transfer most consistently  Where Assessed-Toileting: Glass blower/designer: Moderate assistance Toilet Transfer Method: stedy Science writer: Raised toilet seat,Grab bars, stedy Tub/Shower Transfer: Unable to assess Vision Baseline Vision/History: No visual deficits Patient Visual Report: No change from baseline Vision Assessment?: Yes Eye Alignment: Within Functional Limits Ocular Range of Motion: Within Functional Limits Alignment/Gaze Preference: Within Defined Limits Tracking/Visual Pursuits: Able to track stimulus in all quads without difficulty Saccades: Within functional limits Convergence: Impaired (comment) Visual Fields: No apparent deficits Additional Comments: Impaired convergence/divergence upon DC, overall vision assessment limited 2/2 decreased cognition Perception  Perception: Within Functional Limits Inattention/Neglect: Appears intact Praxis Praxis: Intact Cognition Overall Cognitive Status: Impaired/Different from baseline Arousal/Alertness: Awake/alert Orientation Level: Oriented X4 Attention: Focused;Sustained Focused Attention: Appears intact Sustained Attention: Appears intact Sensation Sensation Light Touch: Appears Intact Hot/Cold: Not tested Proprioception: Not tested Stereognosis: Not tested Additional Comments: Denies numbness and tingling of  extremities Coordination Gross Motor Movements are Fluid and Coordinated: No Fine Motor Movements are Fluid and Coordinated: No Coordination and Movement Description: Able to coordinate BUE to perform basic seated ADL tasks. Finger Nose Finger Test: Overshoots finger/nose when using L hand Motor  Motor Motor: Other (comment);Abnormal postural alignment and control Motor - Discharge Observations: mild L hemiparesis, overall deconditioned/generalized weakness Mobility  Bed Mobility Bed Mobility: Sit to  Sidelying Right °Sit to Sidelying Right: Maximal Assistance - Patient 25-49% °Transfers °Sit to Stand: Minimal Assistance - Patient > 75% °Stand to Sit: Minimal Assistance - Patient > 75%  °Trunk/Postural Assessment  °Cervical Assessment °Cervical Assessment: Exceptions to WFL (cervical flexion) °Thoracic Assessment °Thoracic Assessment: Exceptions to WFL (kyphotic posture, likely premorbid) °Lumbar Assessment °Lumbar Assessment: Exceptions to WFL (posterior pelvic tilt) °Postural Control °Postural Control: Deficits on evaluation  °Balance °Balance °Balance Assessed: Yes °Static Sitting Balance °Static Sitting - Balance Support: Feet supported °Static Sitting - Level of Assistance: 5: Stand by assistance °Dynamic Sitting Balance °Dynamic Sitting - Balance Support: Feet supported °Dynamic Sitting - Level of Assistance: 4: Min assist °Static Standing Balance °Static Standing - Balance Support: Bilateral upper extremity supported °Static Standing - Level of Assistance: 4: Min assist °Dynamic Standing Balance °Dynamic Standing - Balance Support: Bilateral upper extremity supported °Dynamic Standing - Level of Assistance: 4: Min assist °Extremity/Trunk Assessment °RUE Assessment °RUE Assessment: Exceptions to WFL °Active Range of Motion (AROM) Comments: Grossly WFL °General Strength Comments: generalized weakness, 4-/5 °LUE Assessment °LUE Assessment: Exceptions to WFL °Active Range of Motion (AROM) Comments:  Grossly WFL °General Strength Comments: generalized weakness 3+/5 °LUE Body System: Neuro °Brunstrum levels for arm and hand: Arm;Hand °Brunstrum level for arm: Stage V Relative Independence from Synergy °Brunstrum level for hand: Stage V Independence from basic synergies ° °Session Note: °Pt received in TIS with safety belt engaged. Reassessed MMT, ROM, vision, FMC as noted above. Stand-pivot transfer to bed with min A with RW. Doffed shirt with close supervision, min A to don new shirt for orientation + to pull down. Sit EOB > side lying with max A to bring BLE on bed and readjust trunk. Pt with no questions upon discharge, became tearful re: last day of therapy. Pt left R side lying with LUE/LLE supported by pillow, heating pad on back as pt c/o of back pain, NT aware. Adapted call bell in reach and bed alarm engaged.  ° °Rachel A Stevenson, MS, OTR/L °11/10/2020, 5:08 PM °

## 2020-11-10 NOTE — Progress Notes (Signed)
Physical Therapy Discharge Summary  Patient Details  Name: Fernando [name] MRN: [id number] Date of Birth: [birth year]  Today's Date: 11/10/2020 PT Individual Time: 1305-1340 PT Individual Time Calculation (min): 35 min   and  Today's Date: 11/10/2020 PT Missed Time: 25 Minutes Missed Time Reason: Other (Comment) (meal)   Patient has met 5 of 9 long term goals due to improved activity tolerance, improved balance, improved postural control, increased strength, ability to compensate for deficits, functional use of  left upper extremity and left lower extremity, improved attention and improved awareness.  Patient to discharge at a transfer level requiring Mod Assist to/from TIS w/c with use of stedy on days when pt experiencing increased fatigue. Patient's care partner attended hands-on training and is independent to provide the necessary physical and cognitive assistance at discharge.  Reasons goals not met: Wheelchair goal not applicable as pt requires TIS w/c at discharge. Pt continues to demonstrate significant fluctuations in fatigue that result in significant fluctuations in mobility levels and the required assistance to complete mobility tasks. He can require up to mod assist for sitting and standing balance when significantly fatigued with pt demonstrating onset of pushing towards L. He advanced to ambulating up to 20f using RW with min assist of 1 but requires +2 assist for w/c follow in event of fatigue and is unsafe to ambulate in home environment.   Recommendation:  Patient will benefit from ongoing skilled PT services in home health setting to continue to advance safe functional mobility, address ongoing impairments in L hemiparesis, trunk control, activity tolerance, transfer training, gait training, standing balance, and minimize fall risk.  Equipment: hospital bed, custom TIS w/c through [employer] (family purchased stedy privately)  Reasons for discharge: treatment goals met  and discharge from hospital  Patient/family agrees with progress made and goals achieved: Yes  Skilled Therapeutic Interventions/Progress Updates:  Pt received supine in bed wearing amplifier and agreeable to therapy session. Supine>sitting R EOB, HOB elevated and using bedrails as pt will have those features at home, with heavy min assist for trunk upright and mod assist to scoot towards EOB using bed pads. Sitting EOB with intermittent min/mod assist for trunk control due to R posterior trunk lean/LOB - pt having significant difficulty keeping trunk anteriorly weight shifted to maintain midline - total assist LB clothing management and donning shoes. Sit>stand EOB>RW x3 with min/mod assist for lifting/balance due to continued strong posterior lean in standing with total assist to pull pants up over hips - required 3 trials of transfer to complete LB clothing management as pt kept having strong posterior lean causing him to return to sitting on EOB. Anticipate pt with poor upright posture this session due to not yet having progressed to OOB mobility today. Due to poor standing balance, unable to safely attempt stand pivot transfer using RW. Sit>stand EOB>stedy with min assist for lifting - continues to demo posterior lean but improved with ability to use B UE support on stedy bar. NT present with pt's lunch - pt requests for assistance to bathroom and then requests to eat lunch. Stedy transfer in/out of bathroom. Sit<>stand to/from stedy seat with CGA for safety. Tolerates standing in stedy with close supervision while therapist performs totally assist LB clothing management and peri-care - noted to have been incontinent of bladder and continently voids bowels. Stedy transfer back to TIS w/c and left slightly tilted back with needs in reach, seat belt alarm on, and RN present for medication administration and to provide supervision for  meal. Pt happy, emotional during session stating "they are coming to pick  me up tomorrow" and becomes tearful when saying by to therapist. Missed 25 minutes of skilled physical therapy.  PT Discharge Precautions/Restrictions Precautions Precautions: ICD/Pacemaker;Fall Precaution Comments: L sided weakness; very HOH; dysarthria Restrictions Weight Bearing Restrictions: No Pain  Pain Assessment Pain Scale: 0-10 Pain Score: 0-No pain Perception  Perception Perception: Within Functional Limits Praxis Praxis: Impaired Praxis Impairment Details: Motor planning Praxis-Other Comments: still demos some impaired motor planning when fatigued or becoming fearful of falling during transfers  Cognition Overall Cognitive Status: Impaired/Different from baseline Arousal/Alertness: Awake/alert Orientation Level: Oriented X4 Attention: Focused;Sustained Focused Attention: Appears intact Sustained Attention: Appears intact Selective Attention: Impaired Memory: Impaired Awareness: Impaired Problem Solving: Impaired Safety/Judgment: Impaired Sensation Sensation Light Touch: Impaired Detail Light Touch Impaired Details: Impaired LLE Hot/Cold: Not tested Proprioception: Impaired Detail Proprioception Impaired Details: Impaired LLE Coordination Gross Motor Movements are Fluid and Coordinated: No Coordination and Movement Description: gross motor coordination impaired due to L hemiparesis, generalized weakness, impaired trunk control, and impaired midline orientation Motor  Motor Motor: Other (comment);Abnormal postural alignment and control;Hemiplegia Motor - Discharge Observations: mild L hemiparesis along with deconditioning/generalized weakness and impaired midline orientation  Mobility Bed Mobility Bed Mobility: Supine to Sit;Sit to Supine Supine to Sit: Minimal Assistance - Patient > 75%;Moderate Assistance - Patient 50-74% (using bed features) Sit to Supine: Minimal Assistance - Patient > 75%;Moderate Assistance - Patient 50-74% (using bed  features) Transfers Transfers: Sit to Stand;Stand to Sit;Stand Pivot Transfers;Transfer via Lift Equipment Sit to Stand: Minimal Assistance - Patient > 75%;Moderate Assistance - Patient 50-74% Stand to Sit: Minimal Assistance - Patient > 75%;Moderate Assistance - Patient 50-74% Stand Pivot Transfers: Moderate Assistance - Patient 50 - 74%;Minimal Assistance - Patient > 75% Stand Pivot Transfer Details: Verbal cues for sequencing;Verbal cues for technique;Verbal cues for precautions/safety;Verbal cues for safe use of DME/AE;Visual cues/gestures for sequencing Transfer (Assistive device): Rolling walker Transfer via Lift Equipment: Stedy (continues to require intermittent use of stedy for transfers due to significant fluctuations in fatigue - able to perform sit<>stands using stedy with min assist to CGA consistently) Locomotion  Gait Ambulation: Yes Gait Assistance: 2 Helpers;Minimal Assistance - Patient > 75%;Moderate Assistance - Patient 50-74% (min/mod assist with +2 assist w/c follow for safety) Gait Distance (Feet): 23 Feet Assistive device: Rolling walker Gait Assistance Details: Verbal cues for sequencing;Verbal cues for technique;Verbal cues for precautions/safety;Verbal cues for gait pattern;Verbal cues for safe use of DME/AE;Tactile cues for posture;Tactile cues for weight shifting;Tactile cues for sequencing;Visual cues/gestures for sequencing Gait Assistance Details: requires assist for AD management as pt pushes RW too far forward Gait Gait: Yes Gait Pattern: Impaired Gait Pattern: Decreased step length - right;Decreased step length - left;Decreased weight shift to right;Wide base of support;Poor foot clearance - right;Poor foot clearance - left;Shuffle Gait velocity: decreased Stairs / Additional Locomotion Stairs: No Wheelchair Mobility Wheelchair Mobility: No  Trunk/Postural Assessment  Cervical Assessment Cervical Assessment: Exceptions to [facility] (cervical  protraction) Thoracic Assessment Thoracic Assessment: Exceptions to [facility] (kyphotic posture) Lumbar Assessment Lumbar Assessment: Exceptions to [facility] (posterior pelvic tilt) Postural Control Postural Control: Deficits on evaluation Trunk Control: with fatigue continues to demo posterior lean with varying R/L bias Righting Reactions: delayed and inadequate Protective Responses: delayed and inadequate  Balance Balance Balance Assessed: Yes Static Sitting Balance Static Sitting - Balance Support: Feet supported Static Sitting - Level of Assistance: 5: Stand by assistance;4: Min assist Dynamic Sitting Balance Dynamic Sitting - Balance Support: Feet supported  Dynamic Sitting - Level of Assistance: 4: Min Insurance risk surveyor Standing - Balance Support: During functional activity;Bilateral upper extremity supported Static Standing - Level of Assistance: 4: Min assist;3: Mod assist Dynamic Standing Balance Dynamic Standing - Balance Support: Bilateral upper extremity supported;During functional activity Dynamic Standing - Level of Assistance: 4: Min assist;3: Mod assist Extremity Assessment      RLE Assessment RLE Assessment: Exceptions to [facility] General Strength Comments: demos grossly 4+/5 during functional mobility LLE Assessment LLE Assessment: Exceptions to [facility] General Strength Comments: demos grossly 4/5 during functional mobility    Tawana Scale , PT, DPT, CSRS  11/10/2020, 12:18 PM

## 2020-11-10 NOTE — Progress Notes (Signed)
Speech Language Pathology Daily Session Note  Patient Details  Name: Fernando [name] MRN: [id number] Date of Birth: 12-22-1926  Today's Date: 11/10/2020 SLP Individual Time: 7841-2820 SLP Individual Time Calculation (min): 31 min  Short Term Goals: Week 3: SLP Short Term Goal 1 (Week 3): STG=LTG due to remaining length of stay  Skilled Therapeutic Interventions: Pt was seen for skilled ST targeting dysphagia and cognitive goals. SLP assisted with set up, Min A for self feeding, and Min A verbal cues for use of compensatory swallow strategies during intake of current diet dysphagia  1(puree) and thin liquids breakfast items. His oral transit and clearance was timely and efficient. Intermittent throat clearing noted, as is typical for pt. Recommend continue current diet. SLP also provided Mod A for identifying problems and solutions to hypothetical safety problems in functional conversation regarding home safety and fall prevention. Pt left laying in bed with alarm set and needs within reach. Continue per current plan of care.      Pain Pain Assessment Pain Scale: 0-10 Pain Score: 0-No pain  Therapy/Group: Individual Therapy  [name] 11/10/2020, 7:25 AM

## 2020-11-10 NOTE — Discharge Instructions (Signed)
Inpatient Rehab Discharge Instructions  Fernando [name] Discharge date and time: No discharge date for patient encounter.   Activities/Precautions/ Functional Status: Activity: activity as tolerated Diet: Dysphagia #1 thin liquids Wound Care: Routine skin checks Functional status:  ___ No restrictions     ___ Walk up steps independently ___ 24/7 supervision/assistance   ___ Walk up steps with assistance ___ Intermittent supervision/assistance  ___ Bathe/dress independently ___ Walk with walker     _x__ Bathe/dress with assistance ___ Walk Independently    ___ Shower independently ___ Walk with assistance    ___ Shower with assistance ___ No alcohol     ___ Return to work/school ________ COMMUNITY REFERRALS UPON DISCHARGE:    Home Health:   PT     OT     ST                Agency: [facility] Phone: [phone]      Special Instructions: No driving smoking or alcohol  Wound care left heel.  Apply Santyl ointment to the left heel wound in a nickel layer.  Cover with saline moistened gauze then dry gauze and wrapped with Kerlix daily as needed   STROKE/TIA DISCHARGE INSTRUCTIONS SMOKING Cigarette smoking nearly doubles your risk of having a stroke & is the single most alterable risk factor  If you smoke or have smoked in the last 12 months, you are advised to quit smoking for your health.  Most of the excess cardiovascular risk related to smoking disappears within a year of stopping.  Ask you doctor about anti-smoking medications  [place] Quit Line: 1-800-QUIT NOW  Free Smoking Cessation Classes [phone]  CHOLESTEROL Know your levels; limit fat & cholesterol in your diet  Lipid Panel     Component Value Date/Time   CHOL 87 10/07/2020 0539   TRIG 42 10/07/2020 0539   TRIG 39 10/07/2020 0539   HDL 53 10/07/2020 0539   CHOLHDL 1.6 10/07/2020 0539   VLDL 8 10/07/2020 0539   LDLCALC 26 10/07/2020 0539      Many patients benefit from treatment even if their  cholesterol is at goal.  Goal: Total Cholesterol (CHOL) less than 160  Goal:  Triglycerides (TRIG) less than 150  Goal:  HDL greater than 40  Goal:  LDL (LDLCALC) less than 100   BLOOD PRESSURE American Stroke Association blood pressure target is less that 120/80 mm/Hg  Your discharge blood pressure is:  BP: 115/72  Monitor your blood pressure  Limit your salt and alcohol intake  Many individuals will require more than one medication for high blood pressure  DIABETES (A1c is a blood sugar average for last 3 months) Goal HGBA1c is under 7% (HBGA1c is blood sugar average for last 3 months)  Diabetes: No known diagnosis of diabetes    Lab Results  Component Value Date   HGBA1C 5.7 (H) 10/07/2020     Your HGBA1c can be lowered with medications, healthy diet, and exercise.  Check your blood sugar as directed by your physician  Call your physician if you experience unexplained or low blood sugars.  PHYSICAL ACTIVITY/REHABILITATION Goal is 30 minutes at least 4 days per week  Activity: Increase activity slowly, Therapies: Physical Therapy: Home Health Return to work:   Activity decreases your risk of heart attack and stroke and makes your heart stronger.  It helps control your weight and blood pressure; helps you relax and can improve your mood.  Participate in a regular exercise program.  Talk with your  doctor about the best form of exercise for you (dancing, walking, swimming, cycling).  DIET/WEIGHT Goal is to maintain a healthy weight  Your discharge diet is:  Diet Order            DIET - DYS 1 Room service appropriate? Yes with Assist; Fluid consistency: Thin  Diet effective now                 liquids Your height is:  Height: 5\' 5"  (165.1 cm) Your current weight is: Weight: 66 kg Your Body Mass Index (BMI) is:  BMI (Calculated): 24.21  Following the type of diet specifically designed for you will help prevent another stroke.  Your goal weight range is:    Your  goal Body Mass Index (BMI) is 19-24.  Healthy food habits can help reduce 3 risk factors for stroke:  High cholesterol, hypertension, and excess weight.  RESOURCES Stroke/Support Group:  Call [phone]   STROKE EDUCATION PROVIDED/REVIEWED AND GIVEN TO PATIENT Stroke warning signs and symptoms How to activate emergency medical system (call 911). Medications prescribed at discharge. Need for follow-up after discharge. Personal risk factors for stroke. Pneumonia vaccine given:  Flu vaccine given:  My questions have been answered, the writing is legible, and I understand these instructions.  I will adhere to these goals & educational materials that have been provided to me after my discharge from the hospital.      My questions have been answered and I understand these instructions. I will adhere to these goals and the provided educational materials after my discharge from the hospital.  Patient/Caregiver Signature _______________________________ Date __________  Clinician Signature _______________________________________ Date __________  Please bring this form and your medication list with you to all your follow-up doctor's appointments.      Information on my medicine - XARELTO (Rivaroxaban)  Why was Xarelto prescribed for you? Xarelto was prescribed for you to reduce the risk of a blood clot forming that can cause a stroke if you have a medical condition called atrial fibrillation (a type of irregular heartbeat).  What do you need to know about xarelto ? Take your Xarelto ONCE DAILY at the same time every day with your evening meal. If you have difficulty swallowing the tablet whole, you may crush it and mix in applesauce just prior to taking your dose.  Take Xarelto exactly as prescribed by your doctor and DO NOT stop taking Xarelto without talking to the doctor who prescribed the medication.  Stopping without other stroke prevention medication to take the place of  Xarelto may increase your risk of developing a clot that causes a stroke.  Refill your prescription before you run out.  After discharge, you should have regular check-up appointments with your healthcare provider that is prescribing your Xarelto.  In the future your dose may need to be changed if your kidney function or weight changes by a significant amount.  What do you do if you miss a dose? If you are taking Xarelto ONCE DAILY and you miss a dose, take it as soon as you remember on the same day then continue your regularly scheduled once daily regimen the next day. Do not take two doses of Xarelto at the same time or on the same day.   Important Safety Information A possible side effect of Xarelto is bleeding. You should call your healthcare provider right away if you experience any of the following: ? Bleeding from an injury or your nose that does not stop. ? Unusual  colored urine (red or dark brown) or unusual colored stools (red or black). ? Unusual bruising for unknown reasons. ? A serious fall or if you hit your head (even if there is no bleeding).  Some medicines may interact with Xarelto and might increase your risk of bleeding while on Xarelto. To help avoid this, consult your healthcare provider or pharmacist prior to using any new prescription or non-prescription medications, including herbals, vitamins, non-steroidal anti-inflammatory drugs (NSAIDs) and supplements.  This website has more information on Xarelto: [email or web address]

## 2020-11-10 NOTE — Progress Notes (Signed)
[place] PHYSICAL MEDICINE & REHABILITATION PROGRESS NOTE   Subjective/Complaints: No complaints this morning. Denies pain, constipation, insomnia.   ROS: Limited due to cognitive/behavioral    Objective:   No results found. No results for input(s): WBC, HGB, HCT, PLT in the last 72 hours. No results for input(s): NA, K, CL, CO2, GLUCOSE, BUN, CREATININE, CALCIUM in the last 72 hours.  Intake/Output Summary (Last 24 hours) at 11/10/2020 1221 Last data filed at 11/10/2020 0900 Gross per 24 hour  Intake 145 ml  Output --  Net 145 ml     Pressure Injury 10/14/20 Buttocks Left Deep Tissue Pressure Injury - Purple or maroon localized area of discolored intact skin or blood-filled blister due to damage of underlying soft tissue from pressure and/or shear. Purple/red area small fluid filled  (Active)  10/14/20 1700  Location: Buttocks  Location Orientation: Left  Staging: Deep Tissue Pressure Injury - Purple or maroon localized area of discolored intact skin or blood-filled blister due to damage of underlying soft tissue from pressure and/or shear.  Wound Description (Comments): Purple/red area small fluid filled blister noted at center  Present on Admission: Yes     Pressure Injury 11/05/20 Heel Left Deep Tissue Pressure Injury - Purple or maroon localized area of discolored intact skin or blood-filled blister due to damage of underlying soft tissue from pressure and/or shear. Black hard area to mid left heel with b (Active)  11/05/20 1322  Location: Heel  Location Orientation: Left  Staging: Deep Tissue Pressure Injury - Purple or maroon localized area of discolored intact skin or blood-filled blister due to damage of underlying soft tissue from pressure and/or shear.  Wound Description (Comments): Black hard area to mid left heel with blanchable skin surrounding  Present on Admission: No    Physical Exam: Vital Signs Blood pressure (!) 109/49, pulse 63, temperature 98.7 F  (37.1 C), resp. rate 19, height 5\' 5"  (1.651 m), weight 57.7 kg, SpO2 99 %. Gen: no distress, normal appearing HEENT: oral mucosa pink and moist, NCAT Cardio: Reg rate Chest: normal effort, normal rate of breathing Abd: soft, non-distended Ext: no edema  Skin: Left heel eschar 1cm, no evidence of drainage or tenderness , min erythema surrounding eschar    RLE: 4 -/5 proximal to distal LUE: 4 /5 proximal distal, appears unchanged LLE:  3/5 proximal distal   Assessment/Plan: 1. Functional deficits which require 3+ hours per day of interdisciplinary therapy in a comprehensive inpatient rehab setting.  Physiatrist is providing close team supervision and 24 hour management of active medical problems listed below.  Physiatrist and rehab team continue to assess barriers to discharge/monitor patient progress toward functional and medical goals  Care Tool:  Bathing  Bathing activity did not occur: Refused Body parts bathed by patient: Right arm,Left arm,Chest,Abdomen,Right upper leg,Left upper leg,Face,Front perineal area,Right lower leg,Left lower leg   Body parts bathed by helper: Buttocks Body parts n/a: Front perineal area,Buttocks,Right upper leg,Left upper leg,Right lower leg,Left lower leg,Face (did not attempt LB bathing 2/2 time constraints)   Bathing assist Assist Level: Minimal Assistance - Patient > 75% (for buttocks + thoroughness)     Upper Body Dressing/Undressing Upper body dressing   What is the patient wearing?: Pull over shirt    Upper body assist Assist Level: Minimal Assistance - Patient > 75%    Lower Body Dressing/Undressing Lower body dressing      What is the patient wearing?: Pants,Incontinence brief     Lower body assist Assist for lower  body dressing: Maximal Assistance - Patient 25 - 49%     Toileting Toileting    Toileting assist Assist for toileting: Maximal Assistance - Patient 25 - 49%     Transfers Chair/bed transfer  Transfers  assist     Chair/bed transfer assist level: Minimal Assistance - Patient > 75% Chair/bed transfer assistive device: Programmer, multimedia   Ambulation assist   Ambulation activity did not occur: Safety/medical concerns  Assist level: 2 helpers Assistive device: Walker-rolling Max distance: 23   Walk 10 feet activity   Assist  Walk 10 feet activity did not occur: Safety/medical concerns  Assist level: 2 helpers Assistive device: Walker-rolling   Walk 50 feet activity   Assist           Walk 150 feet activity   Assist Walk 150 feet activity did not occur: Safety/medical concerns         Walk 10 feet on uneven surface  activity   Assist Walk 10 feet on uneven surfaces activity did not occur: Safety/medical concerns         Wheelchair     Assist Will patient use wheelchair at discharge?: Yes Type of Wheelchair: Manual    Wheelchair assist level: Minimal Assistance - Patient > 75% Max wheelchair distance: 25    Wheelchair 50 feet with 2 turns activity    Assist        Assist Level: Moderate Assistance - Patient 50 - 74%   Wheelchair 150 feet activity     Assist      Assist Level: Moderate Assistance - Patient 50 - 74%    Medical Problem List and Plan: 1.Left side hemiparesis dysarthria/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 Antithrombotics: -DVT/anticoagulation:Per neurology stopping Xarelto  -antiplatelet therapy: Aspirin81 mg daily 3. Pain Management: ContinueTylenol as needed- increase frequency to q4H prn for low back pain and headache. LFTs normal on mos recent check.   -CLBP add Kpad, also with lidoderm patch, pain appears to be well controlled, continue current regimen.    4. Mood:Provide emotional support -antipsychotic agents:  N/A 5. Neuropsych: This patientis notcapable of making decisions on hisown behalf. 6. Skin/Wound Care:Routine skin checks PRAFO and local care for L heel decub 7. Fluids/Electrolytes/Nutrition:BUN/Creat now normal    -supplement 8. Atrial fibrillation/tachybradycardia syndrome/pacemaker. Per Neuro and cardiology will resume Xarelto and monitor Hgb  Continue Toprol25 mg daily  Rate controlled 12/14- continue to monitor TID 9. SCC of the carotid gland. Follow-up outpatient 10. Hypertension. Toprol 25 mg daily.   Cozaar 25 started on 11/27- increased to 50mg  on 12/2   Vitals:   11/09/20 1944 11/10/20 0419  BP: (!) 131/54 (!) 109/49  Pulse: 60 63  Resp: 18 19  Temp: 97.8 F (36.6 C) 98.7 F (37.1 C)  SpO2: 100% 99%  12/17: Bp still soft despite reduction in clonidine- continue to monitor effects of yesterday's medication change.    11. Hypothyroidism. Synthroid 12. Bacteremia.Completed Ancef 2 g every 8 hours 10-daycourse 13.  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 Dysphagia # 1 thins liquids. May resume diet, drank H20 for me through a straw without cough or wet voice 15. CKD stage II/III. Baseline 1.30-1.48.   Creat now normal , GFR >60 16. Acute blood loss anemia with history of GI bleed 2019. Patient transfused 1 unit packed red blood cells 10/12/2020. Continue  iron supplement. Protonix 40 mg twice daily.   Hemoglobin 8.9 on 11/28 stable vs 11/22 ok to resume Xarelto as rec per Neuro and cardio Xarelto started 11/29 repeat hgb 9.2 12/4   17. Prediabetes. Hemoglobin A1c 5.7. SSI.   Slightly labile, but relatively controlled, continue AC/HS reads    18. Chronic  systolic congestive heart failure-compensated, cards has signed off   Lasix 20 daily per cardiology, appreciate recs Filed Weights   11/08/20 0500 11/09/20 0500 11/10/20 0500  Weight: 58.4 kg 59.4 kg 57.7 kg    12/17: weight decreased, continue to monitor   19,  Hypo K+ normal at  4.2 on 12/13 cont current dose   LOS: 27 days A FACE TO FACE EVALUATION WAS PERFORMED  Martha Clan P Keyla Milone 11/10/2020, 12:21 PM

## 2020-11-10 NOTE — Progress Notes (Signed)
Inpatient Rehabilitation Care Coordinator  Discharge Note  The overall goal for the admission was met for:   Discharge location: Yes, home  Length of Stay: Yes, 29 Days   Discharge activity level: Yes  Home/community participation: Yes  Services provided included: MD, RD, PT, OT, SLP, RN, CM, TR, Pharmacy, Neuropsych and SW  Financial Services: Private Insurance: [employer]  Follow-up services arranged: Home Health: [facility]  Comments (or additional information):  Patient/Family verbalized understanding of follow-up arrangements: Yes  Individual responsible for coordination of the follow-up plan: Marcello Moores (son), [phone]  Confirmed correct DME delivered: Dyanne Iha 11/10/2020    Dyanne Iha

## 2020-11-11 LAB — GLUCOSE, CAPILLARY: Glucose-Capillary: 88 mg/dL (ref 70–99)

## 2020-11-11 NOTE — Progress Notes (Signed)
Pt discharged to home with daughter and other family. He is alert and responsive showing no signs of distress. No complaints of pain. Discharge instructions given per PA. All questions answered. Escorted to car via w/c with RN and NT. All personal belongings taken home.

## 2020-11-13 ENCOUNTER — Ambulatory Visit (INDEPENDENT_AMBULATORY_CARE_PROVIDER_SITE_OTHER): Payer: PPO

## 2020-11-13 DIAGNOSIS — I495 Sick sinus syndrome: Secondary | ICD-10-CM

## 2020-11-13 LAB — CUP PACEART REMOTE DEVICE CHECK
Battery Remaining Longevity: 28 mo
Battery Remaining Percentage: 19 %
Battery Voltage: 2.77 V
Brady Statistic RV Percent Paced: 92 %
Date Time Interrogation Session: 20211218113220
Implantable Lead Implant Date: 20131107
Implantable Lead Implant Date: 20131107
Implantable Lead Location: 753859
Implantable Lead Location: 753860
Implantable Pulse Generator Implant Date: 20131107
Lead Channel Impedance Value: 390 Ohm
Lead Channel Pacing Threshold Amplitude: 0.625 V
Lead Channel Pacing Threshold Pulse Width: 0.4 ms
Lead Channel Sensing Intrinsic Amplitude: 12 mV
Lead Channel Setting Pacing Amplitude: 0.875
Lead Channel Setting Pacing Pulse Width: 0.4 ms
Lead Channel Setting Sensing Sensitivity: 4 mV
Pulse Gen Model: 2210
Pulse Gen Serial Number: 7411696

## 2020-11-15 ENCOUNTER — Telehealth: Payer: Self-pay

## 2020-11-15 NOTE — Telephone Encounter (Signed)
Son called stating pt needs PA for Lidocaine Patches. Discharged on 11/11/2020

## 2020-11-15 NOTE — Telephone Encounter (Signed)
Called [facility] and they do not have a referral for patient. Patient lives in [place] which is close to [place].

## 2020-11-15 NOTE — Telephone Encounter (Signed)
Transitional Care call--Son Marcello Moores    1. Are you/is patient experiencing any problems since coming home? No Are there any questions regarding any aspect of care? No 2. Are there any questions regarding medications administration/dosing? Did but already taking care Are meds being taken as prescribed? Yes Patient should review meds with caller to confirm 3. Have there been any falls? No 4. Has Home Health been to the house and/or have they contacted you? No If not, have you tried to contact them? Yes Can we help you contact them? 5. Are bowels and bladder emptying properly? Yes Are there any unexpected incontinence issues? No If applicable, is patient following bowel/bladder programs? 6. Any fevers, problems with breathing, unexpected pain? No 7. Are there any skin problems or new areas of breakdown? Sore on heel and using cream 8. Has the patient/family member arranged specialty MD follow up (ie cardiology/neurology/renal/surgical/etc)? Not  Can we help arrange? 9. Does the patient need any other services or support that we can help arrange? No 10. Are caregivers following through as expected in assisting the patient? Yes 11. Has the patient quit smoking, drinking alcohol, or using drugs as recommended? Yes  Appointment time 12:30 pm, arrive time 12:15 pm with Dr. Letta Pate on 12/08/2020 [address]

## 2020-11-15 NOTE — Progress Notes (Signed)
Physical Therapy Note  Patient Details  Name: Fernando [name] MRN: [id number] Date of Birth: [birth year] Today's Date: 11/15/2020   Recommending the following equipment to increase functional independence with mobility:    Patient requires a manual tilt in space with power tilt feature custom wheelchair in order to maintain safe positioning. He requires the power tilt feature to allow for independence with pressure relief at scheduled intervals. Patient unable to utilize standard wc at this time due to poor postural control with increased risk for sliding out of chair, poor cardiovascular endurance to be able to propel wc independently. Please refer to Letter of Medical Necessity for specifics of wheelchair evaluation and recommendations.      [name], PT, DPT, CBIS  11/15/2020, 10:37 AM

## 2020-11-20 ENCOUNTER — Telehealth: Payer: Self-pay | Admitting: Internal Medicine

## 2020-11-20 NOTE — Telephone Encounter (Signed)
I will FYI Dr.Taylor

## 2020-11-20 NOTE — Telephone Encounter (Signed)
New message    Per son patient has been moved into Hospice care he has had another stroke, just wanted the office to know

## 2020-11-22 ENCOUNTER — Ambulatory Visit: Payer: PPO | Admitting: Internal Medicine

## 2020-11-25 DEATH — deceased

## 2020-11-27 NOTE — Progress Notes (Signed)
Remote pacemaker transmission.   

## 2020-12-08 ENCOUNTER — Encounter: Payer: PPO | Admitting: Physical Medicine & Rehabilitation

## 2021-06-21 IMAGING — US IR PERCUTANEOUS ART THORMBECTOMY/INFUSION INTRACRANIAL INCLUDE D
2 series · 2 of 2 positions shown · non-contrast
Comparison: CT imaging same day

INDICATION: [AGE] male with a history of acute right MCA syndrome,
presents for acute intervention
TECHNIQUE: Informed written consent was obtained from the patient's family
after a thorough discussion of the procedural risks, benefits and
alternatives. Specific risks discussed include: Bleeding, infection,
contrast reaction, kidney injury/failure, need for further
procedure/surgery, arterial injury or dissection, embolization to
new territory, intracranial hemorrhage (10-15% risk), neurologic
deterioration, cardiopulmonary collapse, death. All questions were
addressed. Maximal Sterile Barrier Technique was utilized including
during the procedure including caps, mask, sterile gowns, sterile
gloves, sterile drape, hand hygiene and skin antiseptic. A timeout
was performed prior to the initiation of the procedure.

The anesthesia team was present to provide general endotracheal tube
anesthesia and for patient monitoring during the procedure.
Interventional neuro radiology nursing staff was also present.

[Series 1: (id) · 1 of 1 slices shown (1 of 2)]
[im 1/1]
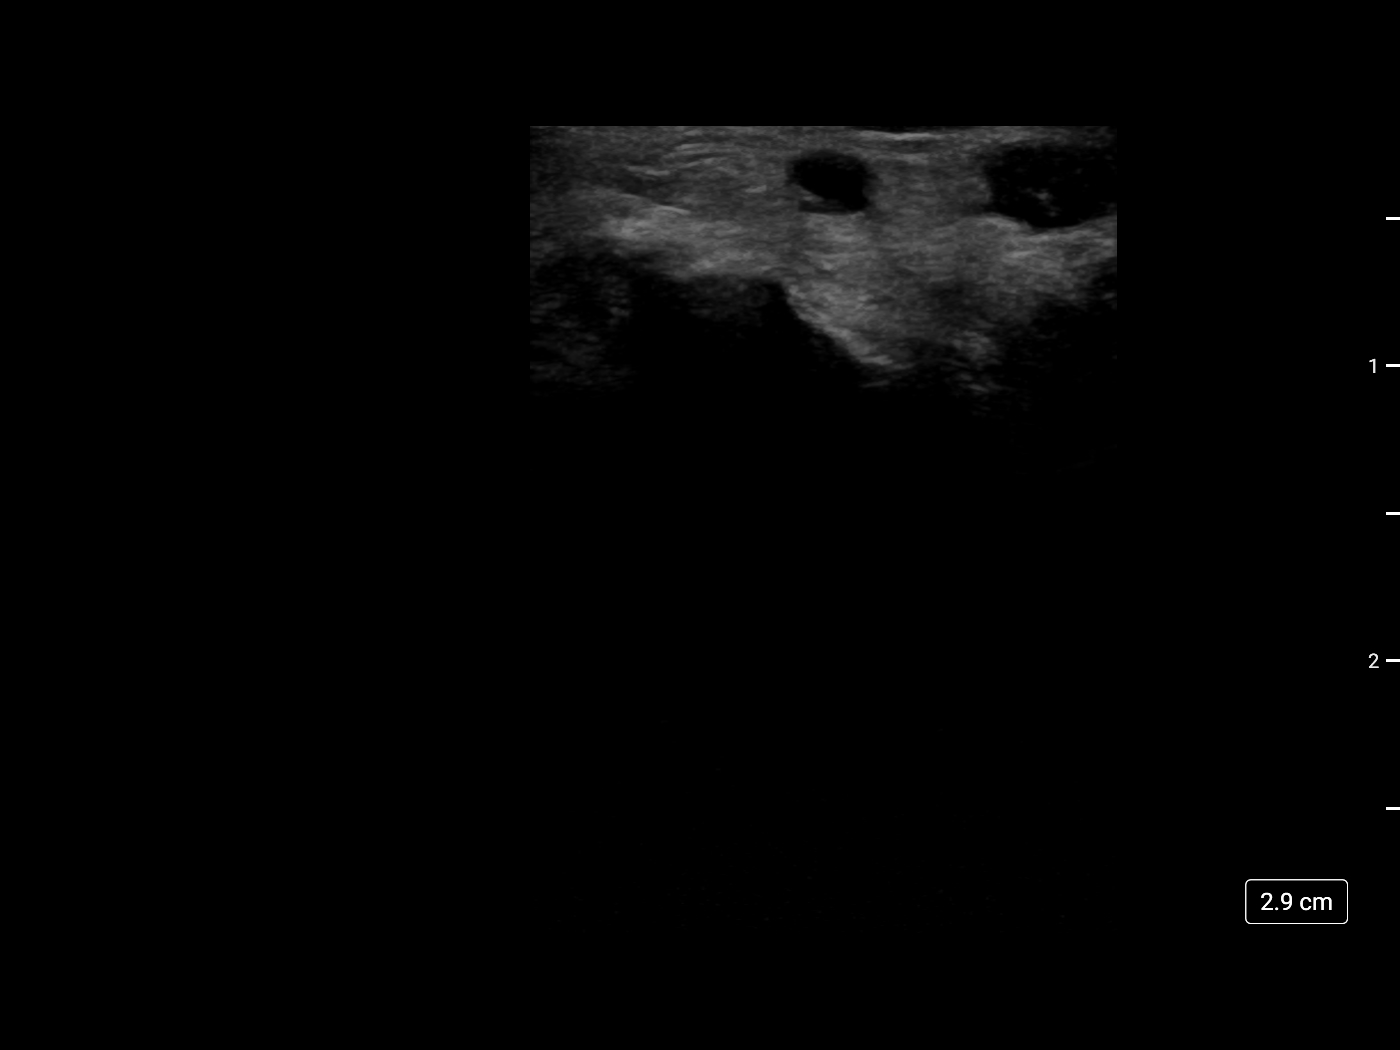

[Series 1: (id) · 1 of 1 slices shown (2 of 2)]
[im 1/1]
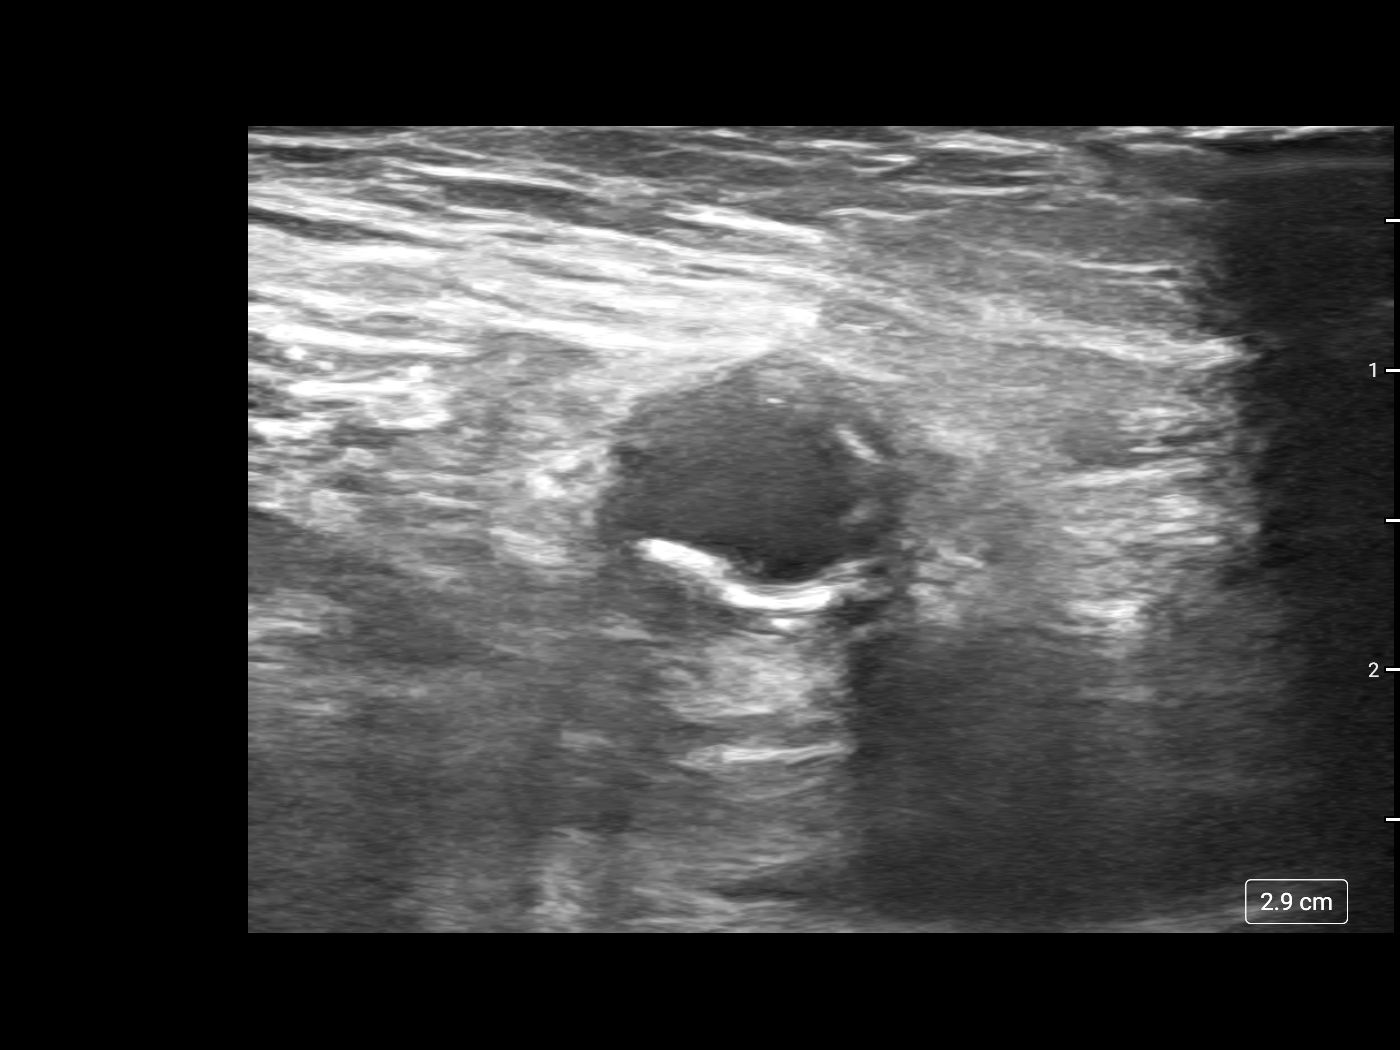

[2 of 2 positions shown; findings below may reference images not displayed]

EXAM:
ULTRASOUND-GUIDED ACCESS RIGHT COMMON FEMORAL ARTERY

ULTRASOUND GUIDED ACCESS LEFT COMMON FEMORAL ARTERY, PRESSURE
MEASUREMENT

ULTRASOUND-GUIDED RIGHT RADIAL ARTERY ACCESS

CERVICAL AND CEREBRAL ANGIOGRAM RIGHT CAROTID SYSTEM

BALLOON ANGIOPLASTY CRITICAL STENOSIS OF RIGHT ICA WITH CEREBRAL
PROTECTION

ANGIO-SEAL FOR HEMOSTASIS RIGHT COMMON FEMORAL ARTERY
MEDICATIONS:
Radial arterial cocktail: 2.5 mg verapamil, 2999 units heparin, 200
mcg nitroglycerin

Antiplatelets strategy: Via the nasogastric tube loading dose of
aspirin (650 mg) and a loading dose of Brilinta (180 mg) was
administered. At the time of the balloon angioplasty we also
administered a loading dose of parenteral cangrelor 15
microgram/kilogram, and then initiated a drip of 2
microgram/kilogram.

ANESTHESIA/SEDATION:
The anesthesia team was present to provide general endotracheal tube
anesthesia and for patient monitoring during the procedure.
Intubation was performed in negative pressure [place] in neuro IR
holding. Left radial arterial line was first attempted by the
anesthesia team. This left radial line was initially unsuccessful
and thus ultrasound-guided access left common femoral artery was
performed. Left radial arterial line was placed at the conclusion of
the case, by the interventional radiology staff, and charted by
anesthesia. Interventional neuro radiology nursing staff was also
present.

CONTRAST:  85 CC

FLUOROSCOPY TIME:  Fluoroscopy Time: 99 minutes 36 seconds (5992
mGy).

COMPLICATIONS:
None
FINDINGS: Initial Findings:

Aorta: Type 3 arch, with angulated takeoff of the innominate artery.
The patient has also a known saccular aneurysm of the aortic arch.
We did not navigate into this outpouching/aneurysm.

Right common carotid artery: Redundant proximal common carotid
artery with a proximal tortuous course. Atherosclerotic changes at
the origin of the common carotid artery, without high-grade
stenosis.

Right external carotid artery: Patent with antegrade flow.

Right internal carotid artery: Critical stenosis at the proximal
right cervical ICA, just beyond the bulb, string sign configuration.
Estimated narrowing by NASCET CT criteria 86%, though this is
favored to underestimate the stenosis.

Right MCA: M1 segment patent. Insular and opercular segments patent.
Unremarkable caliber and course of the cortical segments. Typical
arterial, capillary/ parenchymal, and venous phase.

Right ACA: A1 segment is patent. The right-sided A2 perfuses the
right ACA territory. There is no washout of the ACA territory or the
MCA from left-sided unopacified blood, which confirms poor
collateralization from left-to-right flow. A-comm could be either
atretic or absent/occluded.

Completion Findings:

Cervical ICA: After treatment with balloon angioplasty 5 mm, there
is significant improved diameter measurement and flow. The estimated
percentage residual stenosis 41%.

At the conclusion, all vaso spasm has resolved, with no evidence of
dissection.

Right MCA: MCA remains patent. MCA branches patent. Patent fetal PCA
perfusing the PCA territory.

Right ACA:

A1 is patent with the A2 perfusing the right ACA territory. After
treatment there is cross-filling of a patent anterior communicating
artery, with new right to left cross-filling into the ACA territory
indicating significantly improved cerebral blood flow.

PROCEDURE:
The anesthesia team was present to provide general endotracheal tube
anesthesia and for patient monitoring during the procedure.
Intubation was performed in negative pressure [place] in neuro IR
holding. Interventional neuro radiology nursing staff was also
present.

Ultrasound survey of the right inguinal region was performed with
images stored and sent to PACs.

11 blade scalpel was used to make a small incision. Blunt dissection
was performed with US guidance. A micropuncture needle was used
access the right common femoral artery under ultrasound. With
excellent arterial blood flow returned, an .018 micro wire was
passed through the needle, observed to enter the abdominal aorta
under fluoroscopy. The needle was removed, and a micropuncture
sheath was placed over the wire. The inner dilator and wire were
removed, and an 035 wire was advanced under fluoroscopy into the
abdominal aorta. The micro puncture sheath was removed and a
standard 5 French 10 cm straight vascular sheath was placed. The
dilator was removed and the sheath was flushed.

Given the difficulty with obtaining left radial arterial monitoring
access, we proceeded with a femoral artery access for arterial
monitoring. Ultrasound survey of the left inguinal region was
performed with images stored and sent to PACs, confirming patency of
the vessel.

A micropuncture needle was used access the left common femoral
artery under ultrasound. With excellent arterial blood flow
returned, and an .018 micro wire was passed through the needle,
observed enter the left iliac system under fluoroscopy. The needle
was removed, and a micropuncture sheath was placed over the wire.
The inner dilator and wire were removed, and an 035 Bentson wire was
advanced under fluoroscopy into the abdominal aorta. The sheath was
removed and a standard 4 French vascular sheath was placed. The
dilator was removed and the sheath was flushed. This was then passed
to the anesthesia team for arterial pressure monitoring.

We then proceeded with angiogram.

A JB 1 5 French catheter was navigated on the J wire to the proximal
descending thoracic aorta. Wire was removed and a double flush was
performed of the diagnostic catheter.

The catheter was then used to engage the innominate artery with
initial angiogram performed. Attempt was initially made to gain wire
purchase and catheter purchase into the subclavian artery. Given the
tortuosity in the type 3 arch, this proved difficult, and the
initial purchase was directly into the proximal common carotid
artery.

Angiogram was performed. Initial measurements of the string sign
were performed from this initial angiogram.

Combination of a standard Glidewire, roadrunner wire, and the JB 1
catheter were used in attempt to navigate the JB 1 catheter into the
proximal common carotid artery. This failed, with prolapse into the
aortic arch on multiple attempt.

Diagnostic catheter was then withdrawn into the descending thoracic
aorta. J wire was placed through the catheter, catheter was removed,
and a new 7 French 55 cm sheath was placed.

We then used a combination of the JB 1 catheter, Jevrie tip
catheter, Glidewire, roadrunner wire, with several attempts to
navigate the diagnostic catheters into the proximal common carotid
artery. On each attempt at the tortuosity of the type 3 arch and the
initial curvature of the common carotid artery proved to be in
obstacle for an adequate catheter position.

This initial attempt at catheter position in the common carotid
artery was the majority of fluoroscopy time, estimated 30-45
minutes.

Ultimately we withdrew this attempt, and elected to puncture the
right radial artery for a secondary access.

The right wrist was prepped and draped in the usual sterile fashion.
Ultrasound guided access of the right radial artery was then
performed with a micro puncture placed. On the microwire a 7 French
Pinnacle sheath was placed. Once the inner dilator was removed, a
standard radial arterial pharmacologic cocktail was administered,
which included 2.5 mg verapamil, 2999 units of heparin, and 200 mcg
nitroglycerin.

We then navigated a standard Glidewire and the diagnostic parents
teen catheter to the subclavian artery. Once the catheter was in the
subclavian artery, diagnostic images were performed. Standard
Glidewire was then used to access the common carotid artery. The
diagnostic catheter was advanced around the initial tortuosity into
the proximal common carotid artery. The Glidewire was removed and a
exchange length Rosen wire was placed.

On the Rose an wire we then advanced a penumbra benchmark catheter.
The benchmark catheter then achieved a position in the distal common
carotid artery, just proximal to the bulb and the bifurcation.

Angiogram was performed.

On the back table we prepped a OJS-7 Emboshield cerebral protection
device. The proprietary device was then used to navigate through the
stenosis, and advanced to the internal cerebral artery at the C1
level. The protection device was deployed and the delivery device
was removed.

We then proceeded with balloon angioplasty 5 mm x 40 mm at the
carotid bifurcation. There was no change in heart rate or blood
pressure observed, however, the patient has a pacemaker. No pre
treatment was administered.

Balloon was removed and a angiogram was repeated. Given the improved
diameter estimated less than 50% narrowing, the tortuosity of the
system, and risk of injuring the artery with placement of a 7 French
delivery system without an intermediate catheter/distal access
catheter, we elected to withdraw at this point with balloon
angioplasty only.

Final angiograms were performed of the cervical and cerebral
vasculature.

A 6 French Angio-Seal system was used to close the right common
femoral artery access site.

Manual pressure was used to obtain hemostasis at the left common
femoral artery access site, after confirmation of an operable left
radial arterial line.

A TR band was applied to the right radial access site. 15 cc of air
were used for patent hemostasis.

Patient tolerated the procedure well and remained hemodynamically
stable throughout.

No complications were encountered.

Estimated blood loss 50 cc.

The patient will remain intubated.
IMPRESSION: Status post ultrasound guided access right common femoral artery and
right radial artery for treatment of right ICA critical stenosis
contributing to acute right MCA syndrome with balloon angioplasty
using cerebral protection device, achieving 41% residual stenosis,
decreased from 86%.

Angio-Seal for right common femoral hemostasis.

PLAN:
The patient will remain intubated.

ICU status

Target systolic blood pressure of 120-140

Bilateral hip straight time 6 hours

Frequent neurovascular checks

Repeat neurologic imaging with CT and/MRI at the discretion of
neurology team

Initial anti-platelet strategy is dual antiplatelets. The parental
role anti-platelet will be terminated at [DATE] a.m.

## 2021-06-21 IMAGING — CT CT HEAD CODE STROKE
3 of 6 series · 15 of 47 positions shown, 18 images · non-contrast
Comparison: CT head 07/02/2020.

CLINICAL DATA: Code stroke. [AGE] male with new onset left
weakness.

EXAM:
CT HEAD WITHOUT CONTRAST
TECHNIQUE: Contiguous axial images were obtained from the base of the skull
through the vertex without intravenous contrast.

[Series 2: head w o · axial · 0.48mm/px · z∈[+6,+146]mm · 9 of 34 slices shown, 12 images]
[im 3/34  brain]
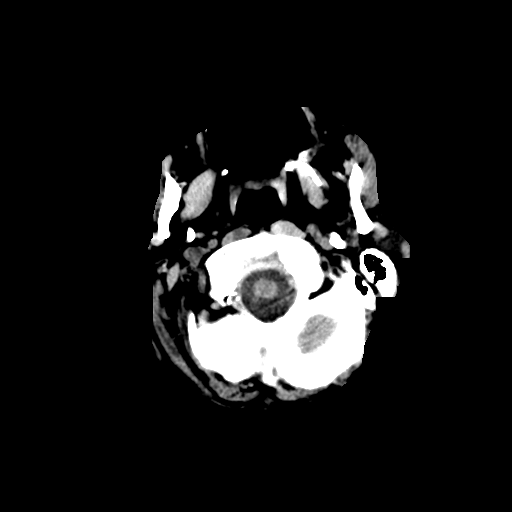
[im 3/34  bone]
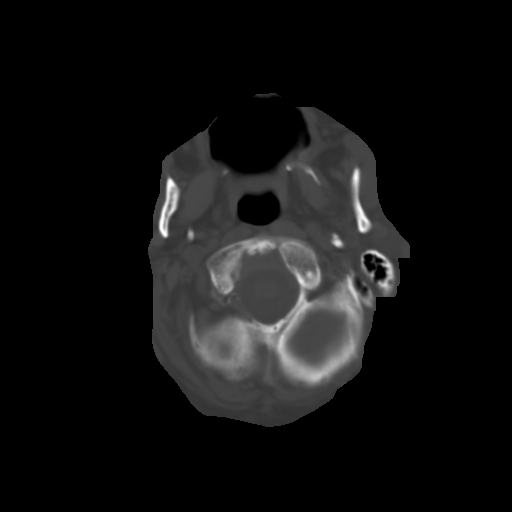
[im 8/34  brain]
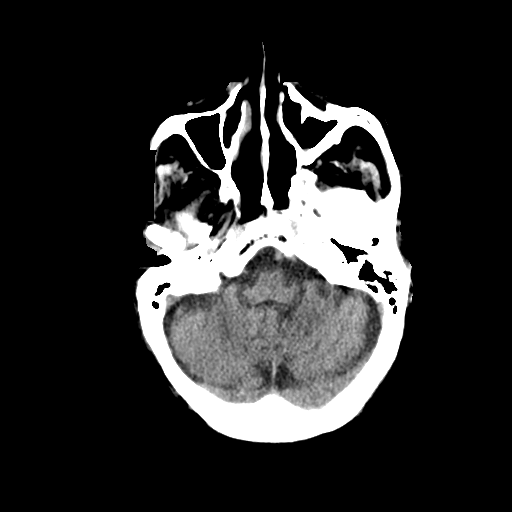
[im 10/34  brain]
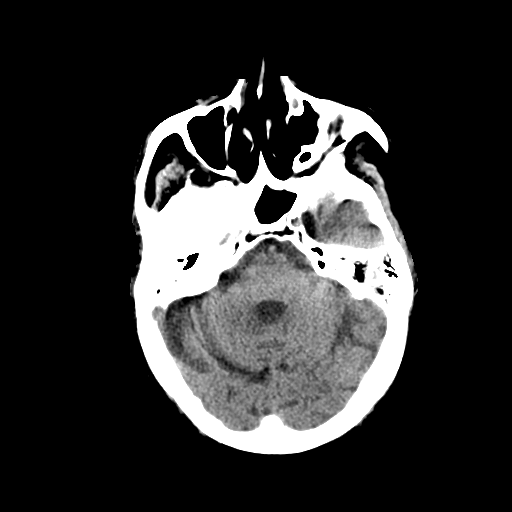
[im 15/34  brain]
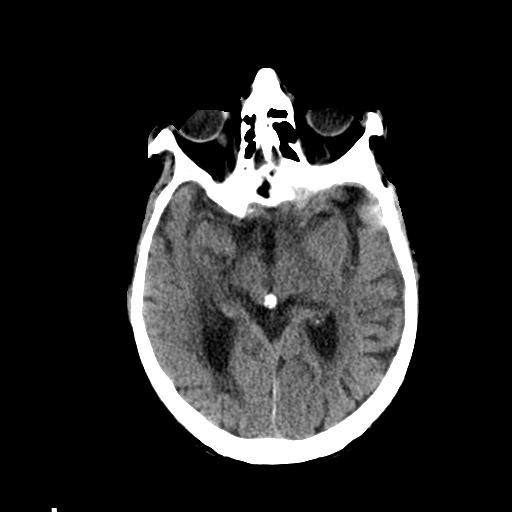
[im 17/34  brain]
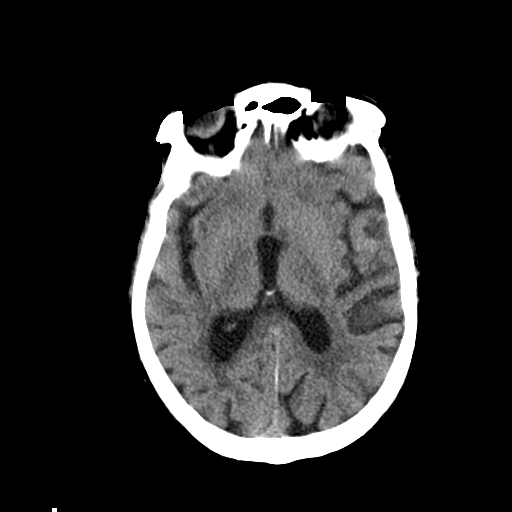
[im 17/34  bone]
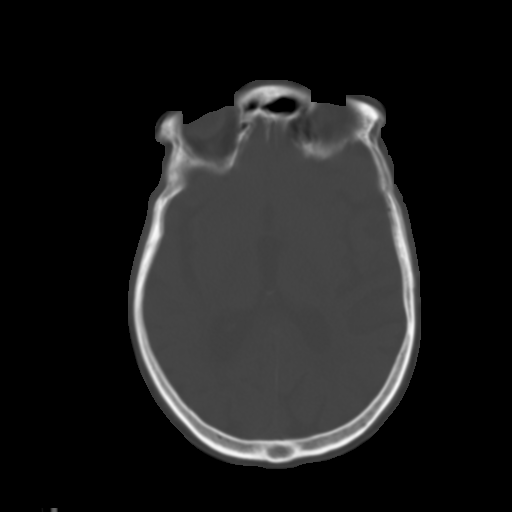
[im 19/34  brain]
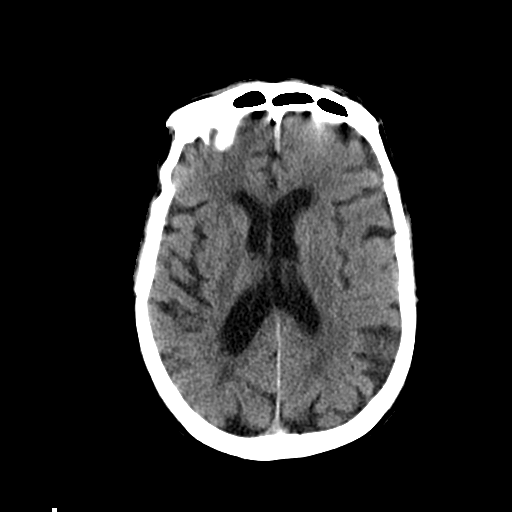
[im 24/34  brain]
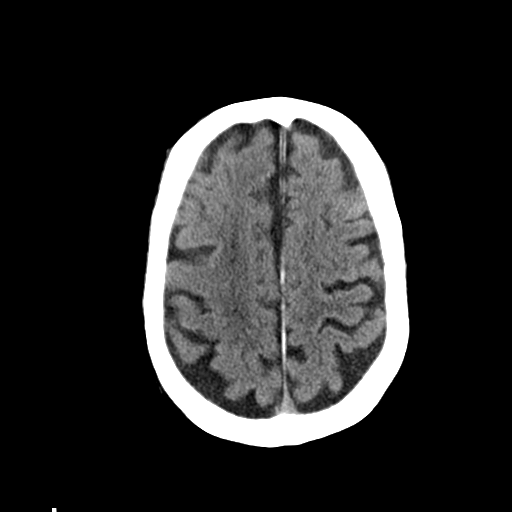
[im 26/34  brain]
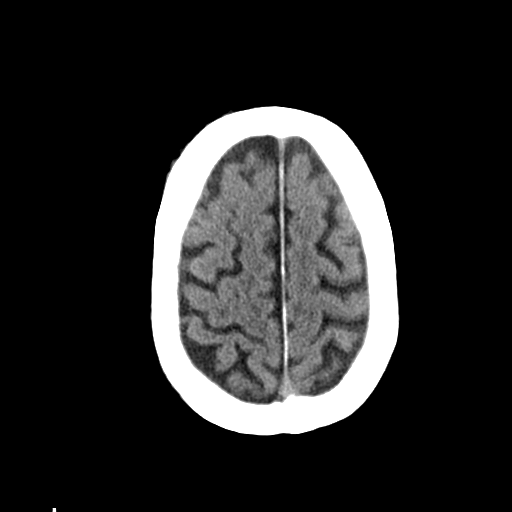
[im 31/34  brain]
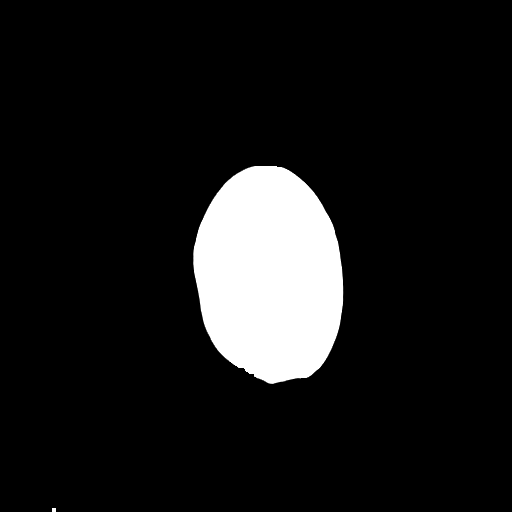
[im 31/34  bone]
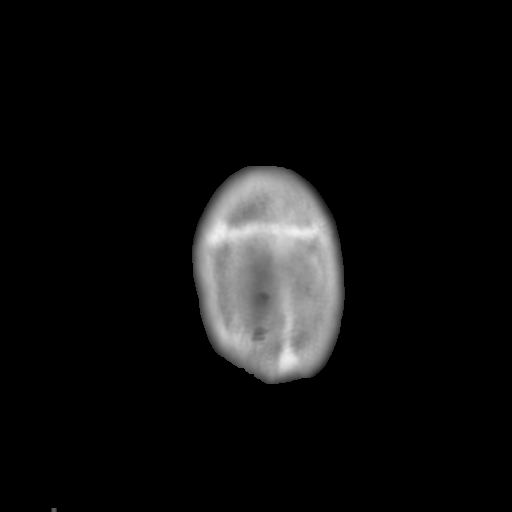

[Series 4: coronal soft · coronal · 0.32mm/px · 3 of 72 slices shown]
[im 18/72  brain]
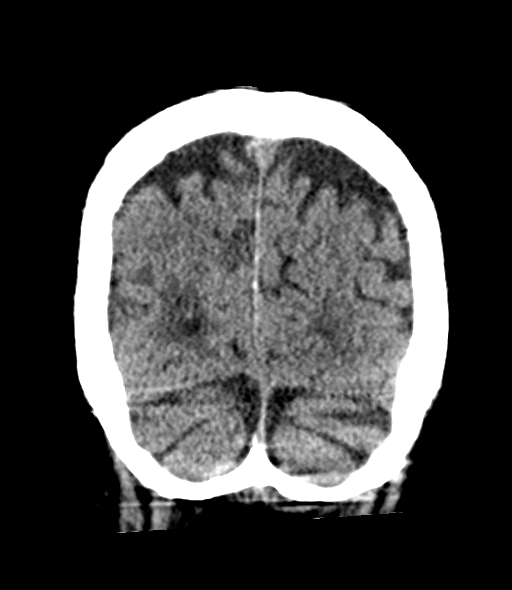
[im 36/72  brain]
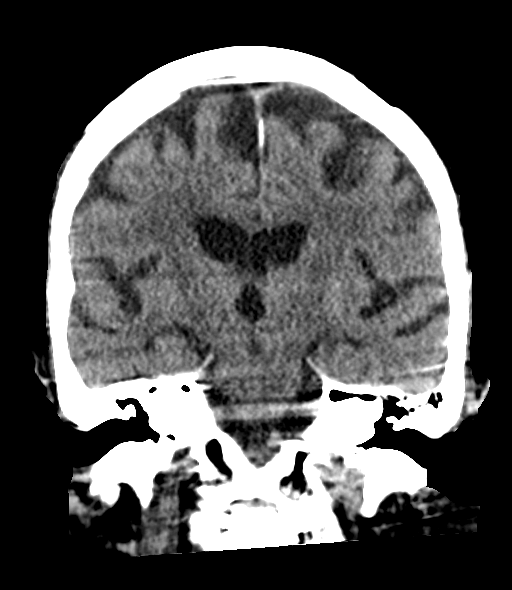
[im 54/72  brain]
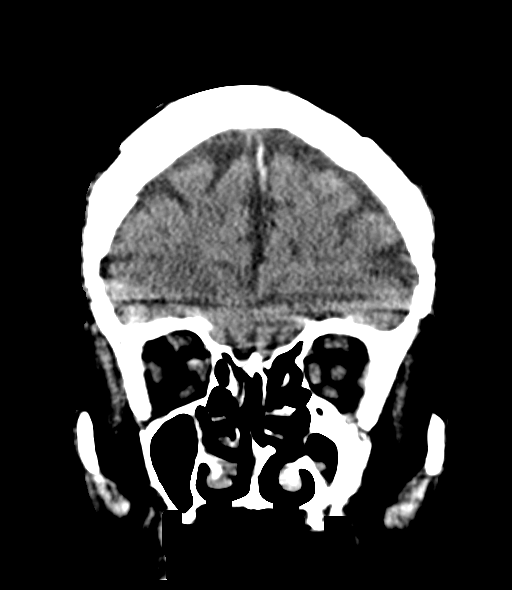

[Series 9: sagittal soft · sagittal · 0.40mm/px · 3 of 53 slices shown]
[im 17/53  brain]
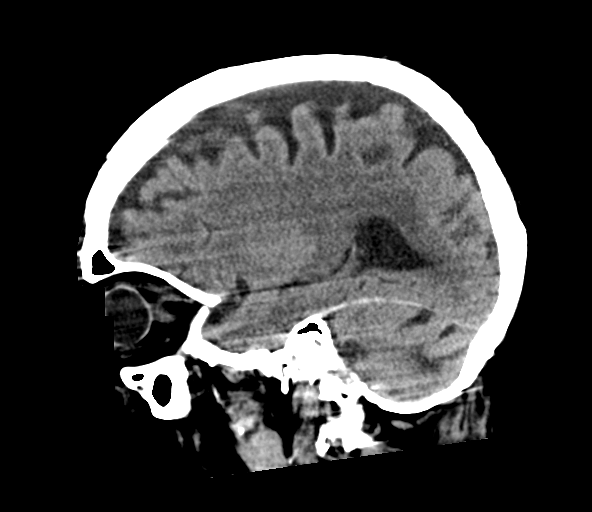
[im 33/53  brain]
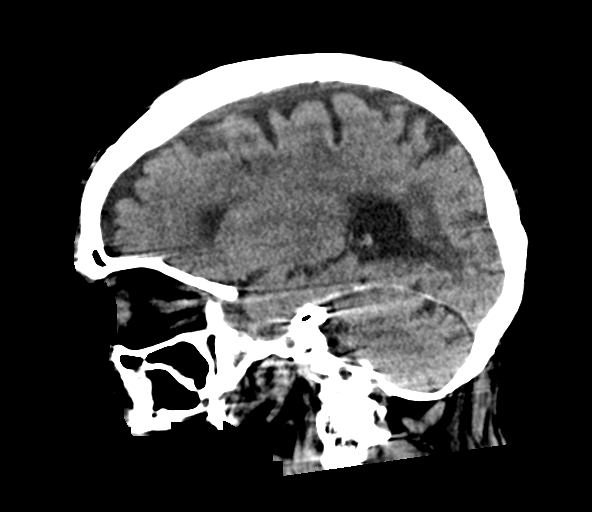
[im 49/53  brain]
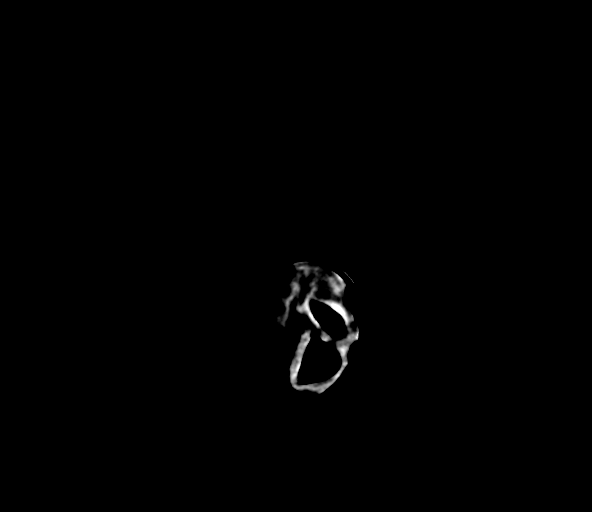

[15 of 47 positions shown; findings below may reference images not displayed]

FINDINGS: Brain: No midline shift, mass effect, or evidence of intracranial
mass lesion. No acute intracranial hemorrhage identified. No
ventriculomegaly.

Stable gray-white matter differentiation throughout the brain. No
acute cortically based infarct identified. Chronic left cerebellar
infarct. Mild chronic encephalomalacia in the right occipital pole.
Chronic lacunar infarct left thalamus.

Vascular: Extensive Calcified atherosclerosis at the skull base. New
hyperdense right MCA (series 4, image 28).

Skull: No acute osseous abnormality identified.

Sinuses/Orbits: Stable paranasal sinuses and mastoids, chronic right
mastoid effusion.

Other: Rightward gaze deviation is new. Stable scalp.

ASPECTS ([place] Stroke Program Early CT Score)

Total score (0-10 with 10 being normal): 10
IMPRESSION: 1. Hyperdense Right MCA suspicious for Emergent Large Vessel
Occlusion.
2. No acute cortically based infarct or acute intracranial
hemorrhage identified. ASPECTS 10.
3. Stable CT appearance of brain parenchyma since [DATE]. Study discussed by telephone with Dr. JOHARRY MAIDEEN on 10/06/2020
at [DATE]. He advised that clinically the patient does not have fix
right gaze, and that the presentation is more suspicious of sepsis
than large vessel occlusion.
